# Patient Record
Sex: Female | Born: 1935 | Race: White | Hispanic: No | Marital: Single | Smoking: Former smoker
Health system: Southern US, Community
[De-identification: ages and names within clinical notes are randomized; demographics above are authoritative.]

## PROBLEM LIST (undated history)

## (undated) DIAGNOSIS — G4733 Obstructive sleep apnea (adult) (pediatric): Secondary | ICD-10-CM

## (undated) DIAGNOSIS — I340 Nonrheumatic mitral (valve) insufficiency: Secondary | ICD-10-CM

## (undated) DIAGNOSIS — J449 Chronic obstructive pulmonary disease, unspecified: Secondary | ICD-10-CM

## (undated) DIAGNOSIS — J310 Chronic rhinitis: Secondary | ICD-10-CM

## (undated) DIAGNOSIS — J948 Other specified pleural conditions: Secondary | ICD-10-CM

## (undated) DIAGNOSIS — I341 Nonrheumatic mitral (valve) prolapse: Secondary | ICD-10-CM

## (undated) DIAGNOSIS — K219 Gastro-esophageal reflux disease without esophagitis: Secondary | ICD-10-CM

## (undated) DIAGNOSIS — I428 Other cardiomyopathies: Secondary | ICD-10-CM

## (undated) DIAGNOSIS — I4891 Unspecified atrial fibrillation: Secondary | ICD-10-CM

## (undated) HISTORY — DX: Chronic rhinitis: J31.0

## (undated) HISTORY — DX: Obstructive sleep apnea (adult) (pediatric): G47.33

## (undated) HISTORY — PX: NOSE SURGERY: SHX723

## (undated) HISTORY — DX: Gastro-esophageal reflux disease without esophagitis: K21.9

## (undated) HISTORY — DX: Unspecified atrial fibrillation: I48.91

## (undated) HISTORY — DX: Other specified pleural conditions: J94.8

## (undated) HISTORY — DX: Chronic obstructive pulmonary disease, unspecified: J44.9

## (undated) HISTORY — DX: Other cardiomyopathies: I42.8

## (undated) HISTORY — DX: Nonrheumatic mitral (valve) prolapse: I34.1

## (undated) HISTORY — PX: OTHER SURGICAL HISTORY: SHX169

## (undated) HISTORY — DX: Nonrheumatic mitral (valve) insufficiency: I34.0

---

## 1998-09-08 HISTORY — PX: APPENDECTOMY: SHX54

## 1999-06-16 ENCOUNTER — Emergency Department (HOSPITAL_COMMUNITY): Admission: EM | Admit: 1999-06-16 | Discharge: 1999-06-16 | Payer: Self-pay | Admitting: Emergency Medicine

## 1999-06-26 ENCOUNTER — Encounter: Payer: Self-pay | Admitting: Internal Medicine

## 1999-06-26 ENCOUNTER — Encounter: Admission: RE | Admit: 1999-06-26 | Discharge: 1999-06-26 | Payer: Self-pay | Admitting: Internal Medicine

## 1999-08-14 ENCOUNTER — Encounter: Admission: RE | Admit: 1999-08-14 | Discharge: 1999-08-14 | Payer: Self-pay | Admitting: Otolaryngology

## 1999-08-14 ENCOUNTER — Encounter: Payer: Self-pay | Admitting: Otolaryngology

## 1999-08-19 ENCOUNTER — Inpatient Hospital Stay (HOSPITAL_COMMUNITY): Admission: EM | Admit: 1999-08-19 | Discharge: 1999-08-31 | Payer: Self-pay | Admitting: Emergency Medicine

## 1999-08-19 ENCOUNTER — Encounter: Payer: Self-pay | Admitting: Internal Medicine

## 1999-08-26 ENCOUNTER — Encounter: Payer: Self-pay | Admitting: Internal Medicine

## 1999-08-28 ENCOUNTER — Encounter: Payer: Self-pay | Admitting: Internal Medicine

## 1999-08-30 ENCOUNTER — Encounter: Payer: Self-pay | Admitting: Internal Medicine

## 1999-09-24 ENCOUNTER — Encounter: Admission: RE | Admit: 1999-09-24 | Discharge: 1999-09-24 | Payer: Self-pay | Admitting: Otolaryngology

## 1999-09-24 ENCOUNTER — Encounter: Payer: Self-pay | Admitting: Otolaryngology

## 1999-10-30 ENCOUNTER — Ambulatory Visit (HOSPITAL_COMMUNITY): Admission: RE | Admit: 1999-10-30 | Discharge: 1999-10-30 | Payer: Self-pay | Admitting: Internal Medicine

## 1999-10-30 ENCOUNTER — Encounter: Payer: Self-pay | Admitting: Internal Medicine

## 1999-11-02 ENCOUNTER — Encounter: Payer: Self-pay | Admitting: Emergency Medicine

## 1999-11-02 ENCOUNTER — Emergency Department (HOSPITAL_COMMUNITY): Admission: EM | Admit: 1999-11-02 | Discharge: 1999-11-02 | Payer: Self-pay | Admitting: Emergency Medicine

## 2000-01-10 ENCOUNTER — Encounter: Payer: Self-pay | Admitting: Gastroenterology

## 2000-01-10 ENCOUNTER — Encounter (INDEPENDENT_AMBULATORY_CARE_PROVIDER_SITE_OTHER): Payer: Self-pay | Admitting: *Deleted

## 2000-01-10 ENCOUNTER — Ambulatory Visit (HOSPITAL_COMMUNITY): Admission: RE | Admit: 2000-01-10 | Discharge: 2000-01-10 | Payer: Self-pay | Admitting: Gastroenterology

## 2000-01-31 ENCOUNTER — Encounter: Admission: RE | Admit: 2000-01-31 | Discharge: 2000-01-31 | Payer: Self-pay | Admitting: Internal Medicine

## 2000-01-31 ENCOUNTER — Encounter: Payer: Self-pay | Admitting: Internal Medicine

## 2000-03-02 ENCOUNTER — Encounter: Admission: RE | Admit: 2000-03-02 | Discharge: 2000-03-02 | Payer: Self-pay | Admitting: *Deleted

## 2000-03-02 ENCOUNTER — Encounter: Payer: Self-pay | Admitting: Allergy and Immunology

## 2000-07-06 ENCOUNTER — Encounter: Payer: Self-pay | Admitting: Otolaryngology

## 2000-07-06 ENCOUNTER — Encounter: Admission: RE | Admit: 2000-07-06 | Discharge: 2000-07-06 | Payer: Self-pay | Admitting: Otolaryngology

## 2000-07-13 ENCOUNTER — Encounter: Payer: Self-pay | Admitting: Internal Medicine

## 2000-07-13 ENCOUNTER — Ambulatory Visit (HOSPITAL_COMMUNITY): Admission: RE | Admit: 2000-07-13 | Discharge: 2000-07-13 | Payer: Self-pay | Admitting: Internal Medicine

## 2000-09-08 HISTORY — PX: LAPAROSCOPIC NISSEN FUNDOPLICATION: SHX1932

## 2000-11-02 ENCOUNTER — Encounter: Admission: RE | Admit: 2000-11-02 | Discharge: 2000-11-02 | Payer: Self-pay | Admitting: Internal Medicine

## 2000-11-02 ENCOUNTER — Encounter: Payer: Self-pay | Admitting: Internal Medicine

## 2000-12-28 ENCOUNTER — Encounter: Admission: RE | Admit: 2000-12-28 | Discharge: 2000-12-28 | Payer: Self-pay | Admitting: Otolaryngology

## 2000-12-28 ENCOUNTER — Encounter: Payer: Self-pay | Admitting: Otolaryngology

## 2000-12-29 ENCOUNTER — Ambulatory Visit (HOSPITAL_BASED_OUTPATIENT_CLINIC_OR_DEPARTMENT_OTHER): Admission: RE | Admit: 2000-12-29 | Discharge: 2000-12-30 | Payer: Self-pay | Admitting: Otolaryngology

## 2000-12-29 ENCOUNTER — Encounter (INDEPENDENT_AMBULATORY_CARE_PROVIDER_SITE_OTHER): Payer: Self-pay | Admitting: *Deleted

## 2001-02-26 ENCOUNTER — Encounter: Payer: Self-pay | Admitting: Internal Medicine

## 2001-02-26 ENCOUNTER — Encounter: Admission: RE | Admit: 2001-02-26 | Discharge: 2001-02-26 | Payer: Self-pay | Admitting: Internal Medicine

## 2001-07-05 ENCOUNTER — Ambulatory Visit (HOSPITAL_COMMUNITY): Admission: RE | Admit: 2001-07-05 | Discharge: 2001-07-05 | Payer: Self-pay | Admitting: Gastroenterology

## 2001-07-30 ENCOUNTER — Encounter: Payer: Self-pay | Admitting: General Surgery

## 2001-07-30 ENCOUNTER — Encounter: Admission: RE | Admit: 2001-07-30 | Discharge: 2001-07-30 | Payer: Self-pay | Admitting: General Surgery

## 2001-09-24 ENCOUNTER — Ambulatory Visit: Admission: RE | Admit: 2001-09-24 | Discharge: 2001-09-24 | Payer: Self-pay | Admitting: General Surgery

## 2001-09-24 ENCOUNTER — Encounter: Payer: Self-pay | Admitting: General Surgery

## 2001-10-19 ENCOUNTER — Ambulatory Visit (HOSPITAL_COMMUNITY): Admission: RE | Admit: 2001-10-19 | Discharge: 2001-10-19 | Payer: Self-pay | Admitting: General Surgery

## 2001-10-19 ENCOUNTER — Encounter: Payer: Self-pay | Admitting: General Surgery

## 2001-11-22 ENCOUNTER — Encounter: Payer: Self-pay | Admitting: Otolaryngology

## 2001-11-22 ENCOUNTER — Ambulatory Visit (HOSPITAL_COMMUNITY): Admission: RE | Admit: 2001-11-22 | Discharge: 2001-11-22 | Payer: Self-pay | Admitting: Otolaryngology

## 2001-11-27 ENCOUNTER — Inpatient Hospital Stay (HOSPITAL_COMMUNITY): Admission: EM | Admit: 2001-11-27 | Discharge: 2001-12-03 | Payer: Self-pay | Admitting: *Deleted

## 2001-11-28 ENCOUNTER — Encounter: Payer: Self-pay | Admitting: *Deleted

## 2002-01-12 ENCOUNTER — Encounter: Payer: Self-pay | Admitting: Otolaryngology

## 2002-01-12 ENCOUNTER — Ambulatory Visit (HOSPITAL_COMMUNITY): Admission: RE | Admit: 2002-01-12 | Discharge: 2002-01-12 | Payer: Self-pay | Admitting: Otolaryngology

## 2002-01-17 ENCOUNTER — Encounter: Payer: Self-pay | Admitting: General Surgery

## 2002-01-24 ENCOUNTER — Inpatient Hospital Stay (HOSPITAL_COMMUNITY): Admission: RE | Admit: 2002-01-24 | Discharge: 2002-01-28 | Payer: Self-pay | Admitting: General Surgery

## 2002-01-24 ENCOUNTER — Encounter: Payer: Self-pay | Admitting: General Surgery

## 2002-01-26 ENCOUNTER — Encounter: Payer: Self-pay | Admitting: General Surgery

## 2002-03-01 ENCOUNTER — Ambulatory Visit (HOSPITAL_COMMUNITY): Admission: RE | Admit: 2002-03-01 | Discharge: 2002-03-01 | Payer: Self-pay | Admitting: General Surgery

## 2002-03-01 ENCOUNTER — Encounter: Payer: Self-pay | Admitting: General Surgery

## 2002-05-19 ENCOUNTER — Inpatient Hospital Stay (HOSPITAL_COMMUNITY): Admission: AD | Admit: 2002-05-19 | Discharge: 2002-06-04 | Payer: Self-pay | Admitting: Internal Medicine

## 2002-05-19 ENCOUNTER — Encounter (INDEPENDENT_AMBULATORY_CARE_PROVIDER_SITE_OTHER): Payer: Self-pay | Admitting: *Deleted

## 2002-05-20 ENCOUNTER — Encounter: Payer: Self-pay | Admitting: Internal Medicine

## 2002-05-21 ENCOUNTER — Encounter: Payer: Self-pay | Admitting: Internal Medicine

## 2002-05-23 ENCOUNTER — Encounter: Payer: Self-pay | Admitting: Internal Medicine

## 2002-05-26 ENCOUNTER — Encounter: Payer: Self-pay | Admitting: Internal Medicine

## 2002-06-03 ENCOUNTER — Encounter: Payer: Self-pay | Admitting: Internal Medicine

## 2002-06-05 ENCOUNTER — Inpatient Hospital Stay (HOSPITAL_COMMUNITY): Admission: EM | Admit: 2002-06-05 | Discharge: 2002-06-09 | Payer: Self-pay | Admitting: *Deleted

## 2002-06-05 ENCOUNTER — Encounter: Payer: Self-pay | Admitting: Emergency Medicine

## 2002-09-12 ENCOUNTER — Ambulatory Visit (HOSPITAL_COMMUNITY): Admission: RE | Admit: 2002-09-12 | Discharge: 2002-09-12 | Payer: Self-pay | Admitting: Otolaryngology

## 2002-09-12 ENCOUNTER — Encounter: Payer: Self-pay | Admitting: Otolaryngology

## 2002-12-21 ENCOUNTER — Ambulatory Visit (HOSPITAL_COMMUNITY): Admission: RE | Admit: 2002-12-21 | Discharge: 2002-12-21 | Payer: Self-pay | Admitting: Otolaryngology

## 2002-12-21 ENCOUNTER — Encounter: Payer: Self-pay | Admitting: Internal Medicine

## 2002-12-21 ENCOUNTER — Encounter: Payer: Self-pay | Admitting: Otolaryngology

## 2002-12-21 ENCOUNTER — Ambulatory Visit (HOSPITAL_COMMUNITY): Admission: RE | Admit: 2002-12-21 | Discharge: 2002-12-21 | Payer: Self-pay | Admitting: Internal Medicine

## 2003-11-21 ENCOUNTER — Ambulatory Visit (HOSPITAL_COMMUNITY): Admission: RE | Admit: 2003-11-21 | Discharge: 2003-11-21 | Payer: Self-pay | Admitting: Otolaryngology

## 2003-12-25 ENCOUNTER — Ambulatory Visit (HOSPITAL_COMMUNITY): Admission: RE | Admit: 2003-12-25 | Discharge: 2003-12-25 | Payer: Self-pay | Admitting: Internal Medicine

## 2004-01-03 ENCOUNTER — Ambulatory Visit (HOSPITAL_COMMUNITY): Admission: RE | Admit: 2004-01-03 | Discharge: 2004-01-03 | Payer: Self-pay | Admitting: Otolaryngology

## 2004-09-12 ENCOUNTER — Ambulatory Visit: Payer: Self-pay | Admitting: Internal Medicine

## 2004-09-18 ENCOUNTER — Ambulatory Visit: Payer: Self-pay | Admitting: Internal Medicine

## 2004-09-25 ENCOUNTER — Ambulatory Visit: Payer: Self-pay | Admitting: Internal Medicine

## 2004-10-11 ENCOUNTER — Ambulatory Visit: Payer: Self-pay | Admitting: Internal Medicine

## 2004-10-15 ENCOUNTER — Ambulatory Visit: Payer: Self-pay | Admitting: Internal Medicine

## 2004-10-23 ENCOUNTER — Ambulatory Visit: Payer: Self-pay | Admitting: Internal Medicine

## 2004-10-30 ENCOUNTER — Ambulatory Visit: Payer: Self-pay | Admitting: Internal Medicine

## 2004-11-07 ENCOUNTER — Ambulatory Visit: Payer: Self-pay | Admitting: Internal Medicine

## 2004-11-13 ENCOUNTER — Ambulatory Visit: Payer: Self-pay | Admitting: Internal Medicine

## 2004-11-18 ENCOUNTER — Ambulatory Visit: Payer: Self-pay | Admitting: Internal Medicine

## 2004-11-21 ENCOUNTER — Ambulatory Visit: Payer: Self-pay | Admitting: Orthopedic Surgery

## 2004-11-26 ENCOUNTER — Encounter (HOSPITAL_COMMUNITY): Admission: RE | Admit: 2004-11-26 | Discharge: 2004-12-26 | Payer: Self-pay | Admitting: Orthopedic Surgery

## 2004-11-28 ENCOUNTER — Ambulatory Visit: Payer: Self-pay | Admitting: Internal Medicine

## 2004-12-04 ENCOUNTER — Ambulatory Visit: Payer: Self-pay | Admitting: Internal Medicine

## 2004-12-13 ENCOUNTER — Ambulatory Visit: Payer: Self-pay | Admitting: Internal Medicine

## 2004-12-16 ENCOUNTER — Ambulatory Visit: Payer: Self-pay | Admitting: Internal Medicine

## 2004-12-23 ENCOUNTER — Ambulatory Visit: Payer: Self-pay | Admitting: Internal Medicine

## 2004-12-30 ENCOUNTER — Ambulatory Visit: Payer: Self-pay | Admitting: Internal Medicine

## 2005-01-01 ENCOUNTER — Ambulatory Visit: Payer: Self-pay | Admitting: Orthopedic Surgery

## 2005-01-08 ENCOUNTER — Ambulatory Visit: Payer: Self-pay | Admitting: Orthopedic Surgery

## 2005-01-08 ENCOUNTER — Ambulatory Visit: Payer: Self-pay | Admitting: Internal Medicine

## 2005-01-21 ENCOUNTER — Ambulatory Visit: Payer: Self-pay | Admitting: Internal Medicine

## 2005-01-30 ENCOUNTER — Ambulatory Visit: Payer: Self-pay | Admitting: Internal Medicine

## 2005-02-05 ENCOUNTER — Ambulatory Visit: Payer: Self-pay | Admitting: Internal Medicine

## 2005-02-10 ENCOUNTER — Ambulatory Visit: Payer: Self-pay | Admitting: Internal Medicine

## 2005-02-11 ENCOUNTER — Ambulatory Visit: Payer: Self-pay | Admitting: Internal Medicine

## 2005-02-26 ENCOUNTER — Ambulatory Visit: Payer: Self-pay | Admitting: Internal Medicine

## 2005-03-05 ENCOUNTER — Ambulatory Visit: Payer: Self-pay | Admitting: Internal Medicine

## 2005-03-12 ENCOUNTER — Ambulatory Visit: Payer: Self-pay | Admitting: Internal Medicine

## 2005-03-13 ENCOUNTER — Ambulatory Visit (HOSPITAL_COMMUNITY): Admission: RE | Admit: 2005-03-13 | Discharge: 2005-03-13 | Payer: Self-pay | Admitting: Internal Medicine

## 2005-03-24 ENCOUNTER — Ambulatory Visit: Payer: Self-pay | Admitting: Internal Medicine

## 2005-04-02 ENCOUNTER — Ambulatory Visit: Payer: Self-pay | Admitting: Internal Medicine

## 2005-04-09 ENCOUNTER — Ambulatory Visit: Payer: Self-pay | Admitting: Internal Medicine

## 2005-04-16 ENCOUNTER — Ambulatory Visit: Payer: Self-pay | Admitting: Internal Medicine

## 2005-04-21 ENCOUNTER — Ambulatory Visit: Payer: Self-pay | Admitting: Internal Medicine

## 2005-04-23 ENCOUNTER — Ambulatory Visit: Payer: Self-pay | Admitting: Internal Medicine

## 2005-04-28 ENCOUNTER — Ambulatory Visit: Payer: Self-pay | Admitting: Internal Medicine

## 2005-05-05 ENCOUNTER — Ambulatory Visit: Payer: Self-pay | Admitting: Internal Medicine

## 2005-05-09 ENCOUNTER — Ambulatory Visit: Payer: Self-pay | Admitting: Internal Medicine

## 2005-05-13 ENCOUNTER — Ambulatory Visit: Payer: Self-pay | Admitting: Internal Medicine

## 2005-05-21 ENCOUNTER — Ambulatory Visit: Payer: Self-pay | Admitting: Internal Medicine

## 2005-06-02 ENCOUNTER — Ambulatory Visit: Payer: Self-pay | Admitting: Internal Medicine

## 2005-06-10 ENCOUNTER — Ambulatory Visit: Payer: Self-pay | Admitting: Pulmonary Disease

## 2005-06-18 ENCOUNTER — Ambulatory Visit: Payer: Self-pay | Admitting: Internal Medicine

## 2005-06-30 ENCOUNTER — Ambulatory Visit: Payer: Self-pay | Admitting: Internal Medicine

## 2005-07-11 ENCOUNTER — Ambulatory Visit: Payer: Self-pay | Admitting: Pulmonary Disease

## 2005-07-16 ENCOUNTER — Ambulatory Visit: Payer: Self-pay | Admitting: Internal Medicine

## 2005-07-25 ENCOUNTER — Ambulatory Visit: Payer: Self-pay | Admitting: Internal Medicine

## 2005-08-06 ENCOUNTER — Ambulatory Visit: Payer: Self-pay | Admitting: Internal Medicine

## 2005-08-11 ENCOUNTER — Ambulatory Visit: Payer: Self-pay | Admitting: Pulmonary Disease

## 2005-08-20 ENCOUNTER — Ambulatory Visit: Payer: Self-pay | Admitting: Internal Medicine

## 2005-09-03 ENCOUNTER — Ambulatory Visit: Payer: Self-pay | Admitting: Internal Medicine

## 2005-09-11 ENCOUNTER — Ambulatory Visit: Payer: Self-pay | Admitting: Internal Medicine

## 2005-09-18 ENCOUNTER — Ambulatory Visit: Payer: Self-pay | Admitting: Internal Medicine

## 2005-09-24 ENCOUNTER — Ambulatory Visit (HOSPITAL_COMMUNITY): Admission: RE | Admit: 2005-09-24 | Discharge: 2005-09-24 | Payer: Self-pay | Admitting: Internal Medicine

## 2005-09-30 ENCOUNTER — Ambulatory Visit: Payer: Self-pay | Admitting: Internal Medicine

## 2005-10-06 ENCOUNTER — Ambulatory Visit (HOSPITAL_COMMUNITY): Admission: RE | Admit: 2005-10-06 | Discharge: 2005-10-06 | Payer: Self-pay | Admitting: Internal Medicine

## 2005-10-15 ENCOUNTER — Ambulatory Visit: Payer: Self-pay | Admitting: Internal Medicine

## 2005-10-20 ENCOUNTER — Ambulatory Visit (HOSPITAL_COMMUNITY): Admission: RE | Admit: 2005-10-20 | Discharge: 2005-10-20 | Payer: Self-pay | Admitting: Internal Medicine

## 2005-10-21 ENCOUNTER — Ambulatory Visit: Payer: Self-pay | Admitting: Internal Medicine

## 2005-10-30 ENCOUNTER — Ambulatory Visit: Payer: Self-pay | Admitting: Internal Medicine

## 2005-11-10 ENCOUNTER — Ambulatory Visit: Payer: Self-pay | Admitting: Internal Medicine

## 2005-11-18 ENCOUNTER — Ambulatory Visit: Payer: Self-pay | Admitting: Internal Medicine

## 2005-11-28 ENCOUNTER — Ambulatory Visit: Payer: Self-pay | Admitting: Internal Medicine

## 2005-12-04 ENCOUNTER — Ambulatory Visit (HOSPITAL_COMMUNITY): Admission: RE | Admit: 2005-12-04 | Discharge: 2005-12-04 | Payer: Self-pay | Admitting: *Deleted

## 2005-12-09 ENCOUNTER — Ambulatory Visit: Payer: Self-pay | Admitting: Internal Medicine

## 2005-12-16 ENCOUNTER — Ambulatory Visit: Payer: Self-pay | Admitting: Internal Medicine

## 2006-01-02 ENCOUNTER — Ambulatory Visit: Payer: Self-pay | Admitting: Internal Medicine

## 2006-01-15 ENCOUNTER — Ambulatory Visit: Payer: Self-pay | Admitting: Internal Medicine

## 2006-01-19 ENCOUNTER — Ambulatory Visit: Payer: Self-pay | Admitting: Internal Medicine

## 2006-02-06 ENCOUNTER — Ambulatory Visit: Payer: Self-pay | Admitting: Internal Medicine

## 2006-02-10 ENCOUNTER — Ambulatory Visit: Payer: Self-pay | Admitting: Internal Medicine

## 2006-02-11 ENCOUNTER — Ambulatory Visit: Payer: Self-pay | Admitting: Internal Medicine

## 2006-02-16 ENCOUNTER — Ambulatory Visit: Payer: Self-pay | Admitting: Internal Medicine

## 2006-02-25 ENCOUNTER — Ambulatory Visit: Payer: Self-pay | Admitting: Internal Medicine

## 2006-03-09 ENCOUNTER — Ambulatory Visit: Payer: Self-pay | Admitting: Internal Medicine

## 2006-03-16 ENCOUNTER — Ambulatory Visit: Payer: Self-pay | Admitting: Internal Medicine

## 2006-03-23 ENCOUNTER — Ambulatory Visit: Payer: Self-pay | Admitting: Internal Medicine

## 2006-04-07 ENCOUNTER — Ambulatory Visit: Payer: Self-pay | Admitting: Internal Medicine

## 2006-04-15 ENCOUNTER — Ambulatory Visit: Payer: Self-pay | Admitting: Internal Medicine

## 2006-04-15 ENCOUNTER — Ambulatory Visit (HOSPITAL_COMMUNITY): Admission: RE | Admit: 2006-04-15 | Discharge: 2006-04-15 | Payer: Self-pay | Admitting: Internal Medicine

## 2006-04-21 ENCOUNTER — Ambulatory Visit: Payer: Self-pay | Admitting: Internal Medicine

## 2006-05-06 ENCOUNTER — Ambulatory Visit: Payer: Self-pay | Admitting: Internal Medicine

## 2006-05-13 ENCOUNTER — Ambulatory Visit: Payer: Self-pay | Admitting: Internal Medicine

## 2006-05-15 ENCOUNTER — Ambulatory Visit: Payer: Self-pay | Admitting: Internal Medicine

## 2006-05-28 ENCOUNTER — Ambulatory Visit: Payer: Self-pay | Admitting: Internal Medicine

## 2006-06-09 ENCOUNTER — Ambulatory Visit: Payer: Self-pay | Admitting: Internal Medicine

## 2006-06-15 ENCOUNTER — Ambulatory Visit (HOSPITAL_COMMUNITY): Admission: RE | Admit: 2006-06-15 | Discharge: 2006-06-15 | Payer: Self-pay | Admitting: Internal Medicine

## 2006-06-19 ENCOUNTER — Ambulatory Visit: Payer: Self-pay | Admitting: Internal Medicine

## 2006-06-25 ENCOUNTER — Ambulatory Visit: Payer: Self-pay | Admitting: Internal Medicine

## 2006-07-07 ENCOUNTER — Ambulatory Visit: Payer: Self-pay | Admitting: Internal Medicine

## 2006-07-23 ENCOUNTER — Ambulatory Visit: Payer: Self-pay | Admitting: Internal Medicine

## 2006-08-14 ENCOUNTER — Encounter: Admission: RE | Admit: 2006-08-14 | Discharge: 2006-08-14 | Payer: Self-pay | Admitting: *Deleted

## 2006-08-24 ENCOUNTER — Ambulatory Visit: Payer: Self-pay | Admitting: Internal Medicine

## 2006-09-28 ENCOUNTER — Ambulatory Visit: Payer: Self-pay | Admitting: Internal Medicine

## 2006-10-06 ENCOUNTER — Ambulatory Visit: Payer: Self-pay | Admitting: Internal Medicine

## 2006-10-26 ENCOUNTER — Ambulatory Visit: Payer: Self-pay | Admitting: Internal Medicine

## 2006-11-03 ENCOUNTER — Ambulatory Visit: Payer: Self-pay | Admitting: Internal Medicine

## 2006-11-24 ENCOUNTER — Ambulatory Visit: Payer: Self-pay | Admitting: Internal Medicine

## 2006-12-02 ENCOUNTER — Ambulatory Visit: Payer: Self-pay | Admitting: Internal Medicine

## 2006-12-03 ENCOUNTER — Ambulatory Visit (HOSPITAL_COMMUNITY): Admission: RE | Admit: 2006-12-03 | Discharge: 2006-12-03 | Payer: Self-pay | Admitting: Internal Medicine

## 2006-12-28 ENCOUNTER — Ambulatory Visit: Payer: Self-pay | Admitting: Internal Medicine

## 2007-01-25 ENCOUNTER — Ambulatory Visit: Payer: Self-pay | Admitting: Internal Medicine

## 2007-02-16 ENCOUNTER — Emergency Department (HOSPITAL_COMMUNITY): Admission: EM | Admit: 2007-02-16 | Discharge: 2007-02-16 | Payer: Self-pay | Admitting: Emergency Medicine

## 2007-02-24 ENCOUNTER — Ambulatory Visit: Payer: Self-pay | Admitting: Internal Medicine

## 2007-03-08 ENCOUNTER — Ambulatory Visit: Payer: Self-pay | Admitting: Internal Medicine

## 2007-03-24 ENCOUNTER — Ambulatory Visit: Payer: Self-pay | Admitting: Internal Medicine

## 2007-04-15 ENCOUNTER — Ambulatory Visit: Payer: Self-pay | Admitting: Internal Medicine

## 2007-04-19 ENCOUNTER — Ambulatory Visit (HOSPITAL_COMMUNITY): Admission: RE | Admit: 2007-04-19 | Discharge: 2007-04-19 | Payer: Self-pay | Admitting: *Deleted

## 2007-04-28 ENCOUNTER — Ambulatory Visit: Payer: Self-pay | Admitting: Internal Medicine

## 2007-05-17 ENCOUNTER — Ambulatory Visit: Payer: Self-pay | Admitting: Internal Medicine

## 2007-05-28 ENCOUNTER — Ambulatory Visit: Payer: Self-pay | Admitting: Internal Medicine

## 2007-06-28 ENCOUNTER — Ambulatory Visit: Payer: Self-pay | Admitting: Internal Medicine

## 2007-06-30 ENCOUNTER — Ambulatory Visit: Payer: Self-pay | Admitting: Gastroenterology

## 2007-07-05 ENCOUNTER — Ambulatory Visit (HOSPITAL_COMMUNITY): Admission: RE | Admit: 2007-07-05 | Discharge: 2007-07-05 | Payer: Self-pay | Admitting: Gastroenterology

## 2007-07-16 ENCOUNTER — Ambulatory Visit: Payer: Self-pay | Admitting: Internal Medicine

## 2007-07-30 ENCOUNTER — Ambulatory Visit: Payer: Self-pay | Admitting: Pulmonary Disease

## 2007-08-25 ENCOUNTER — Ambulatory Visit: Payer: Self-pay | Admitting: Gastroenterology

## 2007-08-30 ENCOUNTER — Ambulatory Visit: Payer: Self-pay | Admitting: Pulmonary Disease

## 2007-09-03 ENCOUNTER — Ambulatory Visit: Payer: Self-pay | Admitting: Internal Medicine

## 2007-09-03 DIAGNOSIS — I059 Rheumatic mitral valve disease, unspecified: Secondary | ICD-10-CM | POA: Insufficient documentation

## 2007-09-15 ENCOUNTER — Ambulatory Visit: Payer: Self-pay | Admitting: Internal Medicine

## 2007-09-20 ENCOUNTER — Telehealth: Payer: Self-pay | Admitting: Internal Medicine

## 2007-09-30 ENCOUNTER — Ambulatory Visit: Payer: Self-pay | Admitting: Internal Medicine

## 2007-10-28 ENCOUNTER — Encounter: Payer: Self-pay | Admitting: Internal Medicine

## 2007-10-29 ENCOUNTER — Telehealth (INDEPENDENT_AMBULATORY_CARE_PROVIDER_SITE_OTHER): Payer: Self-pay | Admitting: *Deleted

## 2007-11-01 ENCOUNTER — Ambulatory Visit: Payer: Self-pay | Admitting: Internal Medicine

## 2007-11-04 ENCOUNTER — Telehealth (INDEPENDENT_AMBULATORY_CARE_PROVIDER_SITE_OTHER): Payer: Self-pay | Admitting: *Deleted

## 2007-11-10 ENCOUNTER — Encounter: Payer: Self-pay | Admitting: Internal Medicine

## 2007-11-12 ENCOUNTER — Ambulatory Visit: Payer: Self-pay | Admitting: Internal Medicine

## 2007-11-12 DIAGNOSIS — J4489 Other specified chronic obstructive pulmonary disease: Secondary | ICD-10-CM | POA: Insufficient documentation

## 2007-11-12 DIAGNOSIS — G4733 Obstructive sleep apnea (adult) (pediatric): Secondary | ICD-10-CM

## 2007-11-12 DIAGNOSIS — J449 Chronic obstructive pulmonary disease, unspecified: Secondary | ICD-10-CM

## 2007-11-21 ENCOUNTER — Encounter: Payer: Self-pay | Admitting: Internal Medicine

## 2007-11-30 ENCOUNTER — Telehealth: Payer: Self-pay | Admitting: Internal Medicine

## 2007-12-01 ENCOUNTER — Telehealth: Payer: Self-pay | Admitting: Internal Medicine

## 2007-12-02 ENCOUNTER — Telehealth: Payer: Self-pay | Admitting: Internal Medicine

## 2007-12-10 ENCOUNTER — Ambulatory Visit: Payer: Self-pay | Admitting: Internal Medicine

## 2007-12-13 ENCOUNTER — Telehealth (INDEPENDENT_AMBULATORY_CARE_PROVIDER_SITE_OTHER): Payer: Self-pay | Admitting: *Deleted

## 2007-12-13 ENCOUNTER — Ambulatory Visit: Payer: Self-pay | Admitting: Internal Medicine

## 2007-12-15 ENCOUNTER — Telehealth: Payer: Self-pay | Admitting: Internal Medicine

## 2008-01-07 ENCOUNTER — Ambulatory Visit: Payer: Self-pay | Admitting: Internal Medicine

## 2008-01-12 ENCOUNTER — Ambulatory Visit: Payer: Self-pay | Admitting: Internal Medicine

## 2008-01-19 ENCOUNTER — Encounter: Payer: Self-pay | Admitting: Internal Medicine

## 2008-01-19 ENCOUNTER — Ambulatory Visit: Payer: Self-pay | Admitting: Gastroenterology

## 2008-01-25 ENCOUNTER — Telehealth (INDEPENDENT_AMBULATORY_CARE_PROVIDER_SITE_OTHER): Payer: Self-pay | Admitting: *Deleted

## 2008-02-07 ENCOUNTER — Ambulatory Visit: Payer: Self-pay | Admitting: Internal Medicine

## 2008-03-13 ENCOUNTER — Ambulatory Visit: Payer: Self-pay | Admitting: Internal Medicine

## 2008-04-03 ENCOUNTER — Ambulatory Visit (HOSPITAL_COMMUNITY): Admission: RE | Admit: 2008-04-03 | Discharge: 2008-04-03 | Payer: Self-pay | Admitting: Family Medicine

## 2008-04-13 ENCOUNTER — Ambulatory Visit: Payer: Self-pay | Admitting: Internal Medicine

## 2008-05-03 ENCOUNTER — Ambulatory Visit (HOSPITAL_COMMUNITY): Admission: RE | Admit: 2008-05-03 | Discharge: 2008-05-03 | Payer: Self-pay | Admitting: Family Medicine

## 2008-05-12 ENCOUNTER — Ambulatory Visit: Payer: Self-pay | Admitting: Internal Medicine

## 2008-05-25 DIAGNOSIS — Z8719 Personal history of other diseases of the digestive system: Secondary | ICD-10-CM

## 2008-05-25 DIAGNOSIS — I080 Rheumatic disorders of both mitral and aortic valves: Secondary | ICD-10-CM

## 2008-05-26 ENCOUNTER — Telehealth: Payer: Self-pay | Admitting: Internal Medicine

## 2008-06-01 ENCOUNTER — Ambulatory Visit: Payer: Self-pay | Admitting: Gastroenterology

## 2008-06-13 ENCOUNTER — Ambulatory Visit: Payer: Self-pay | Admitting: Internal Medicine

## 2008-07-14 ENCOUNTER — Ambulatory Visit: Payer: Self-pay | Admitting: Internal Medicine

## 2008-07-27 ENCOUNTER — Telehealth: Payer: Self-pay | Admitting: Internal Medicine

## 2008-08-14 ENCOUNTER — Ambulatory Visit: Payer: Self-pay | Admitting: Internal Medicine

## 2008-08-23 ENCOUNTER — Telehealth (INDEPENDENT_AMBULATORY_CARE_PROVIDER_SITE_OTHER): Payer: Self-pay | Admitting: *Deleted

## 2008-08-29 ENCOUNTER — Encounter: Payer: Self-pay | Admitting: Internal Medicine

## 2008-09-14 ENCOUNTER — Ambulatory Visit: Payer: Self-pay | Admitting: Internal Medicine

## 2008-09-15 ENCOUNTER — Telehealth (INDEPENDENT_AMBULATORY_CARE_PROVIDER_SITE_OTHER): Payer: Self-pay | Admitting: *Deleted

## 2008-10-16 ENCOUNTER — Ambulatory Visit: Payer: Self-pay | Admitting: Internal Medicine

## 2008-11-13 ENCOUNTER — Ambulatory Visit: Payer: Self-pay | Admitting: Internal Medicine

## 2008-11-29 ENCOUNTER — Telehealth: Payer: Self-pay | Admitting: Internal Medicine

## 2008-12-14 ENCOUNTER — Ambulatory Visit: Payer: Self-pay | Admitting: Internal Medicine

## 2009-01-15 ENCOUNTER — Ambulatory Visit: Payer: Self-pay | Admitting: Internal Medicine

## 2009-02-12 ENCOUNTER — Ambulatory Visit: Payer: Self-pay | Admitting: Internal Medicine

## 2009-02-20 ENCOUNTER — Telehealth (INDEPENDENT_AMBULATORY_CARE_PROVIDER_SITE_OTHER): Payer: Self-pay | Admitting: *Deleted

## 2009-02-27 ENCOUNTER — Encounter: Payer: Self-pay | Admitting: Internal Medicine

## 2009-03-14 ENCOUNTER — Encounter: Payer: Self-pay | Admitting: Internal Medicine

## 2009-03-14 ENCOUNTER — Ambulatory Visit: Payer: Self-pay | Admitting: Pulmonary Disease

## 2009-04-03 ENCOUNTER — Ambulatory Visit: Payer: Self-pay | Admitting: Internal Medicine

## 2009-04-13 ENCOUNTER — Ambulatory Visit: Payer: Self-pay | Admitting: Internal Medicine

## 2009-05-08 ENCOUNTER — Ambulatory Visit (HOSPITAL_COMMUNITY): Admission: RE | Admit: 2009-05-08 | Discharge: 2009-05-08 | Payer: Self-pay | Admitting: Family Medicine

## 2009-05-15 ENCOUNTER — Ambulatory Visit: Payer: Self-pay | Admitting: Internal Medicine

## 2009-06-13 ENCOUNTER — Ambulatory Visit: Payer: Self-pay | Admitting: Internal Medicine

## 2009-07-02 ENCOUNTER — Telehealth: Payer: Self-pay | Admitting: Internal Medicine

## 2009-07-13 ENCOUNTER — Ambulatory Visit: Payer: Self-pay | Admitting: Internal Medicine

## 2009-07-31 ENCOUNTER — Ambulatory Visit: Payer: Self-pay | Admitting: Internal Medicine

## 2009-08-13 ENCOUNTER — Ambulatory Visit: Payer: Self-pay | Admitting: Internal Medicine

## 2009-08-22 ENCOUNTER — Telehealth (INDEPENDENT_AMBULATORY_CARE_PROVIDER_SITE_OTHER): Payer: Self-pay | Admitting: *Deleted

## 2009-08-24 ENCOUNTER — Encounter: Payer: Self-pay | Admitting: Internal Medicine

## 2009-09-06 ENCOUNTER — Encounter: Payer: Self-pay | Admitting: Internal Medicine

## 2009-09-10 ENCOUNTER — Telehealth: Payer: Self-pay | Admitting: Internal Medicine

## 2009-09-14 ENCOUNTER — Ambulatory Visit: Payer: Self-pay | Admitting: Internal Medicine

## 2009-10-17 ENCOUNTER — Ambulatory Visit: Payer: Self-pay | Admitting: Internal Medicine

## 2009-10-31 ENCOUNTER — Telehealth (INDEPENDENT_AMBULATORY_CARE_PROVIDER_SITE_OTHER): Payer: Self-pay | Admitting: *Deleted

## 2009-11-14 ENCOUNTER — Encounter: Payer: Self-pay | Admitting: Internal Medicine

## 2009-11-16 ENCOUNTER — Ambulatory Visit: Payer: Self-pay | Admitting: Internal Medicine

## 2009-11-29 ENCOUNTER — Ambulatory Visit: Payer: Self-pay | Admitting: Internal Medicine

## 2009-12-03 ENCOUNTER — Encounter: Payer: Self-pay | Admitting: Internal Medicine

## 2009-12-06 ENCOUNTER — Telehealth: Payer: Self-pay | Admitting: Internal Medicine

## 2009-12-17 ENCOUNTER — Ambulatory Visit: Payer: Self-pay | Admitting: Internal Medicine

## 2010-01-14 ENCOUNTER — Ambulatory Visit: Payer: Self-pay | Admitting: Internal Medicine

## 2010-01-28 ENCOUNTER — Telehealth: Payer: Self-pay | Admitting: Internal Medicine

## 2010-02-14 ENCOUNTER — Ambulatory Visit: Payer: Self-pay | Admitting: Internal Medicine

## 2010-03-19 ENCOUNTER — Ambulatory Visit: Payer: Self-pay | Admitting: Internal Medicine

## 2010-04-16 ENCOUNTER — Ambulatory Visit: Payer: Self-pay | Admitting: Internal Medicine

## 2010-05-20 ENCOUNTER — Ambulatory Visit: Payer: Self-pay | Admitting: Internal Medicine

## 2010-05-28 ENCOUNTER — Encounter: Payer: Self-pay | Admitting: Internal Medicine

## 2010-05-28 ENCOUNTER — Ambulatory Visit: Payer: Self-pay | Admitting: Internal Medicine

## 2010-05-28 DIAGNOSIS — I4891 Unspecified atrial fibrillation: Secondary | ICD-10-CM | POA: Insufficient documentation

## 2010-05-31 ENCOUNTER — Ambulatory Visit: Payer: Self-pay | Admitting: Cardiology

## 2010-06-03 ENCOUNTER — Ambulatory Visit (HOSPITAL_COMMUNITY): Admission: RE | Admit: 2010-06-03 | Discharge: 2010-06-03 | Payer: Self-pay | Admitting: Cardiology

## 2010-06-03 ENCOUNTER — Ambulatory Visit: Payer: Self-pay | Admitting: Cardiology

## 2010-06-03 ENCOUNTER — Encounter: Payer: Self-pay | Admitting: Cardiology

## 2010-06-04 ENCOUNTER — Encounter: Payer: Self-pay | Admitting: Cardiology

## 2010-06-07 LAB — CONVERTED CEMR LAB
Basophils Relative: 0.5 % (ref 0.0–3.0)
Chloride: 105 meq/L (ref 96–112)
Eosinophils Relative: 1.5 % (ref 0.0–5.0)
GFR calc non Af Amer: 58.96 mL/min (ref >60)
HCT: 35 % — ABNORMAL LOW (ref 36.0–46.0)
Hemoglobin: 11.7 g/dL — ABNORMAL LOW (ref 12.0–15.0)
Lymphs Abs: 0.7 10*3/uL (ref 0.7–4.0)
Monocytes Relative: 5.6 % (ref 3.0–12.0)
Neutro Abs: 4.4 10*3/uL (ref 1.4–7.7)
Platelets: 160 10*3/uL (ref 150.0–400.0)
Potassium: 3.8 meq/L (ref 3.5–5.1)
RBC: 3.96 M/uL (ref 3.87–5.11)
Sodium: 139 meq/L (ref 135–145)
Total CK: 50 units/L (ref 7–177)
WBC: 5.5 10*3/uL (ref 4.5–10.5)

## 2010-06-20 ENCOUNTER — Ambulatory Visit: Payer: Self-pay | Admitting: Internal Medicine

## 2010-06-21 ENCOUNTER — Ambulatory Visit: Payer: Self-pay | Admitting: Cardiology

## 2010-06-21 ENCOUNTER — Telehealth: Payer: Self-pay | Admitting: Internal Medicine

## 2010-06-21 ENCOUNTER — Telehealth (INDEPENDENT_AMBULATORY_CARE_PROVIDER_SITE_OTHER): Payer: Self-pay | Admitting: *Deleted

## 2010-06-21 DIAGNOSIS — I429 Cardiomyopathy, unspecified: Secondary | ICD-10-CM

## 2010-07-10 ENCOUNTER — Telehealth: Payer: Self-pay | Admitting: Internal Medicine

## 2010-07-10 ENCOUNTER — Ambulatory Visit: Payer: Self-pay | Admitting: Internal Medicine

## 2010-07-12 ENCOUNTER — Ambulatory Visit (HOSPITAL_COMMUNITY): Admission: RE | Admit: 2010-07-12 | Discharge: 2010-07-12 | Payer: Self-pay | Admitting: Internal Medicine

## 2010-07-12 ENCOUNTER — Encounter: Payer: Self-pay | Admitting: Internal Medicine

## 2010-07-18 ENCOUNTER — Ambulatory Visit (HOSPITAL_COMMUNITY): Admission: RE | Admit: 2010-07-18 | Payer: Self-pay | Admitting: *Deleted

## 2010-07-22 ENCOUNTER — Telehealth (INDEPENDENT_AMBULATORY_CARE_PROVIDER_SITE_OTHER): Payer: Self-pay

## 2010-07-22 ENCOUNTER — Ambulatory Visit: Payer: Self-pay | Admitting: Internal Medicine

## 2010-08-02 ENCOUNTER — Telehealth: Payer: Self-pay | Admitting: Internal Medicine

## 2010-08-21 ENCOUNTER — Ambulatory Visit: Payer: Self-pay | Admitting: Internal Medicine

## 2010-08-21 LAB — CONVERTED CEMR LAB
Basophils Absolute: 0 10*3/uL (ref 0.0–0.1)
Chloride: 106 meq/L (ref 96–112)
Eosinophils Relative: 0.5 % (ref 0.0–5.0)
GFR calc non Af Amer: 42.15 mL/min — ABNORMAL LOW (ref >60.00)
Glucose, Bld: 91 mg/dL (ref 70–99)
HCT: 36.4 % (ref 36.0–46.0)
Hemoglobin: 11.9 g/dL — ABNORMAL LOW (ref 12.0–15.0)
Lymphs Abs: 0.9 10*3/uL (ref 0.7–4.0)
Monocytes Relative: 7.4 % (ref 3.0–12.0)
Neutro Abs: 3.7 10*3/uL (ref 1.4–7.7)
Potassium: 4.3 meq/L (ref 3.5–5.1)
RDW: 16.8 % — ABNORMAL HIGH (ref 11.5–14.6)
Sodium: 140 meq/L (ref 135–145)

## 2010-08-23 ENCOUNTER — Encounter (INDEPENDENT_AMBULATORY_CARE_PROVIDER_SITE_OTHER): Payer: Self-pay | Admitting: *Deleted

## 2010-08-23 ENCOUNTER — Ambulatory Visit: Payer: Self-pay | Admitting: Cardiology

## 2010-09-04 ENCOUNTER — Encounter: Payer: Self-pay | Admitting: Internal Medicine

## 2010-09-05 ENCOUNTER — Telehealth (INDEPENDENT_AMBULATORY_CARE_PROVIDER_SITE_OTHER): Payer: Self-pay

## 2010-09-15 ENCOUNTER — Encounter: Payer: Self-pay | Admitting: Internal Medicine

## 2010-09-16 ENCOUNTER — Encounter: Payer: Self-pay | Admitting: Adult Health

## 2010-09-16 ENCOUNTER — Ambulatory Visit
Admission: RE | Admit: 2010-09-16 | Discharge: 2010-09-16 | Payer: Self-pay | Source: Home / Self Care | Attending: Adult Health | Admitting: Adult Health

## 2010-09-16 LAB — CONVERTED CEMR LAB
Basophils Absolute: 0 10*3/uL (ref 0.0–0.1)
Basophils Relative: 0 % (ref 0–1)
CO2: 22 meq/L (ref 19–32)
Calcium: 9.3 mg/dL (ref 8.4–10.5)
Creatinine, Ser: 1.14 mg/dL (ref 0.40–1.20)
Eosinophils Absolute: 0 10*3/uL (ref 0.0–0.7)
Eosinophils Relative: 1 % (ref 0–5)
Glucose, Bld: 111 mg/dL — ABNORMAL HIGH (ref 70–99)
HCT: 39.8 % (ref 36.0–46.0)
Hemoglobin: 12.5 g/dL (ref 12.0–15.0)
MCHC: 31.4 g/dL (ref 30.0–36.0)
Monocytes Absolute: 0.5 10*3/uL (ref 0.1–1.0)
RDW: 17.9 % — ABNORMAL HIGH (ref 11.5–15.5)

## 2010-09-17 ENCOUNTER — Telehealth (INDEPENDENT_AMBULATORY_CARE_PROVIDER_SITE_OTHER): Payer: Self-pay | Admitting: *Deleted

## 2010-09-17 ENCOUNTER — Telehealth: Payer: Self-pay | Admitting: Internal Medicine

## 2010-09-18 ENCOUNTER — Ambulatory Visit: Admit: 2010-09-18 | Payer: Self-pay | Admitting: Internal Medicine

## 2010-09-18 ENCOUNTER — Encounter: Payer: Self-pay | Admitting: Internal Medicine

## 2010-09-23 ENCOUNTER — Ambulatory Visit
Admission: RE | Admit: 2010-09-23 | Disposition: A | Discharge status: Other Acute Inpatient Hospital | Payer: Self-pay | Source: Home / Self Care | Attending: Cardiology | Admitting: Cardiology

## 2010-09-23 ENCOUNTER — Inpatient Hospital Stay (HOSPITAL_COMMUNITY)
Admission: RE | Admit: 2010-09-23 | Discharge: 2010-10-04 | Payer: Self-pay | Attending: Cardiology | Admitting: Cardiology

## 2010-09-24 ENCOUNTER — Encounter: Payer: Self-pay | Admitting: Internal Medicine

## 2010-09-25 ENCOUNTER — Encounter: Payer: Self-pay | Admitting: Cardiology

## 2010-09-25 LAB — COMPREHENSIVE METABOLIC PANEL
ALT: 25 U/L (ref 0–35)
AST: 21 U/L (ref 0–37)
Albumin: 2.8 g/dL — ABNORMAL LOW (ref 3.5–5.2)
Alkaline Phosphatase: 89 U/L (ref 39–117)
BUN: 25 mg/dL — ABNORMAL HIGH (ref 6–23)
CO2: 25 mEq/L (ref 19–32)
Calcium: 9.1 mg/dL (ref 8.4–10.5)
Chloride: 102 mEq/L (ref 96–112)
Creatinine, Ser: 1.14 mg/dL (ref 0.4–1.2)
GFR calc Af Amer: 56 mL/min — ABNORMAL LOW (ref >60)
GFR calc non Af Amer: 47 mL/min — ABNORMAL LOW (ref >60)
Glucose, Bld: 127 mg/dL — ABNORMAL HIGH (ref 70–99)
Potassium: 4 mEq/L (ref 3.5–5.1)
Sodium: 136 mEq/L (ref 135–145)
Total Bilirubin: 0.6 mg/dL (ref 0.3–1.2)
Total Protein: 6.4 g/dL (ref 6.0–8.3)

## 2010-09-25 LAB — MRSA PCR SCREENING: MRSA by PCR: NEGATIVE

## 2010-09-25 LAB — CBC
HCT: 37.7 % (ref 36.0–46.0)
HCT: 33.4 % — ABNORMAL LOW (ref 36.0–46.0)
Hemoglobin: 12.2 g/dL (ref 12.0–15.0)
Hemoglobin: 11.2 g/dL — ABNORMAL LOW (ref 12.0–15.0)
MCH: 25.3 pg — ABNORMAL LOW (ref 26.0–34.0)
MCH: 26 pg (ref 26.0–34.0)
MCHC: 32.4 g/dL (ref 30.0–36.0)
MCHC: 33.5 g/dL (ref 30.0–36.0)
MCV: 78.2 fL (ref 78.0–100.0)
MCV: 77.5 fL — ABNORMAL LOW (ref 78.0–100.0)
Platelets: 189 10*3/uL (ref 150–400)
Platelets: 155 10*3/uL (ref 150–400)
RBC: 4.82 MIL/uL (ref 3.87–5.11)
RBC: 4.31 MIL/uL (ref 3.87–5.11)
RDW: 17.4 % — ABNORMAL HIGH (ref 11.5–15.5)
RDW: 17.4 % — ABNORMAL HIGH (ref 11.5–15.5)
WBC: 5.2 10*3/uL (ref 4.0–10.5)
WBC: 4 10*3/uL (ref 4.0–10.5)

## 2010-09-25 LAB — BRAIN NATRIURETIC PEPTIDE: Pro B Natriuretic peptide (BNP): 339 pg/mL — ABNORMAL HIGH (ref 0.0–100.0)

## 2010-09-25 LAB — HEPARIN LEVEL (UNFRACTIONATED)
Heparin Unfractionated: 0.34 IU/mL (ref 0.30–0.70)
Heparin Unfractionated: 0.39 IU/mL (ref 0.30–0.70)

## 2010-09-28 ENCOUNTER — Encounter: Payer: Self-pay | Admitting: Internal Medicine

## 2010-09-29 ENCOUNTER — Encounter: Payer: Self-pay | Admitting: Family Medicine

## 2010-09-29 ENCOUNTER — Encounter: Payer: Self-pay | Admitting: Internal Medicine

## 2010-09-30 LAB — URINE MICROSCOPIC-ADD ON

## 2010-09-30 LAB — BASIC METABOLIC PANEL
BUN: 18 mg/dL (ref 6–23)
CO2: 29 mEq/L (ref 19–32)
CO2: 31 mEq/L (ref 19–32)
Calcium: 8.7 mg/dL (ref 8.4–10.5)
Chloride: 96 mEq/L (ref 96–112)
Chloride: 97 mEq/L (ref 96–112)
Creatinine, Ser: 1.33 mg/dL — ABNORMAL HIGH (ref 0.4–1.2)
GFR calc non Af Amer: 34 mL/min — ABNORMAL LOW (ref >60)
Glucose, Bld: 99 mg/dL (ref 70–99)
Glucose, Bld: 101 mg/dL — ABNORMAL HIGH (ref 70–99)

## 2010-09-30 LAB — CBC
HCT: 34.3 % — ABNORMAL LOW (ref 36.0–46.0)
HCT: 33.2 % — ABNORMAL LOW (ref 36.0–46.0)
HCT: 34.6 % — ABNORMAL LOW (ref 36.0–46.0)
Hemoglobin: 11.9 g/dL — ABNORMAL LOW (ref 12.0–15.0)
Hemoglobin: 11.2 g/dL — ABNORMAL LOW (ref 12.0–15.0)
MCH: 25.4 pg — ABNORMAL LOW (ref 26.0–34.0)
MCH: 25.7 pg — ABNORMAL LOW (ref 26.0–34.0)
MCHC: 32.1 g/dL (ref 30.0–36.0)
MCHC: 33.8 g/dL (ref 30.0–36.0)
MCHC: 32.4 g/dL (ref 30.0–36.0)
MCV: 79.2 fL (ref 78.0–100.0)
MCV: 79 fL (ref 78.0–100.0)
Platelets: 133 10*3/uL — ABNORMAL LOW (ref 150–400)
Platelets: 156 10*3/uL (ref 150–400)
RBC: 4.33 MIL/uL (ref 3.87–5.11)
RBC: 4.2 MIL/uL (ref 3.87–5.11)
RDW: 17.8 % — ABNORMAL HIGH (ref 11.5–15.5)
WBC: 4 10*3/uL (ref 4.0–10.5)
WBC: 5.1 10*3/uL (ref 4.0–10.5)
WBC: 4.6 10*3/uL (ref 4.0–10.5)

## 2010-09-30 LAB — URINALYSIS, ROUTINE W REFLEX MICROSCOPIC
Bilirubin Urine: NEGATIVE
Hgb urine dipstick: NEGATIVE
Nitrite: POSITIVE — AB
Specific Gravity, Urine: 1.016 (ref 1.005–1.030)
Urine Glucose, Fasting: NEGATIVE mg/dL
pH: 6.5 (ref 5.0–8.0)

## 2010-09-30 LAB — COMPREHENSIVE METABOLIC PANEL
AST: 26 U/L (ref 0–37)
Alkaline Phosphatase: 79 U/L (ref 39–117)
CO2: 28 mEq/L (ref 19–32)
Calcium: 9 mg/dL (ref 8.4–10.5)
Chloride: 100 mEq/L (ref 96–112)
GFR calc non Af Amer: 36 mL/min — ABNORMAL LOW (ref >60)
Glucose, Bld: 98 mg/dL (ref 70–99)
Total Protein: 5.9 g/dL — ABNORMAL LOW (ref 6.0–8.3)

## 2010-09-30 LAB — HEPARIN LEVEL (UNFRACTIONATED): Heparin Unfractionated: 0.33 IU/mL (ref 0.30–0.70)

## 2010-09-30 LAB — FERRITIN: Ferritin: 18 ng/mL (ref 10–291)

## 2010-09-30 LAB — T4, FREE: Free T4: 1.17 ng/dL (ref 0.80–1.80)

## 2010-09-30 LAB — IRON AND TIBC: UIBC: 277 ug/dL

## 2010-10-01 LAB — CBC
HCT: 33 % — ABNORMAL LOW (ref 36.0–46.0)
Hemoglobin: 11.4 g/dL — ABNORMAL LOW (ref 12.0–15.0)
Hemoglobin: 10.8 g/dL — ABNORMAL LOW (ref 12.0–15.0)
MCH: 25.1 pg — ABNORMAL LOW (ref 26.0–34.0)
MCH: 25.5 pg — ABNORMAL LOW (ref 26.0–34.0)
MCH: 25.8 pg — ABNORMAL LOW (ref 26.0–34.0)
MCV: 78.8 fL (ref 78.0–100.0)
Platelets: 124 10*3/uL — ABNORMAL LOW (ref 150–400)
RBC: 4.42 MIL/uL (ref 3.87–5.11)
RBC: 4.47 MIL/uL (ref 3.87–5.11)
RBC: 4.19 MIL/uL (ref 3.87–5.11)
WBC: 5.8 10*3/uL (ref 4.0–10.5)

## 2010-10-01 LAB — BASIC METABOLIC PANEL
BUN: 15 mg/dL (ref 6–23)
CO2: 29 mEq/L (ref 19–32)
CO2: 29 mEq/L (ref 19–32)
Calcium: 8.7 mg/dL (ref 8.4–10.5)
Chloride: 100 mEq/L (ref 96–112)
Chloride: 101 mEq/L (ref 96–112)
Creatinine, Ser: 1.25 mg/dL — ABNORMAL HIGH (ref 0.4–1.2)
Creatinine, Ser: 1.25 mg/dL — ABNORMAL HIGH (ref 0.4–1.2)
GFR calc Af Amer: 51 mL/min — ABNORMAL LOW (ref >60)
GFR calc Af Amer: 52 mL/min — ABNORMAL LOW (ref >60)
GFR calc non Af Amer: 43 mL/min — ABNORMAL LOW (ref >60)
Sodium: 137 mEq/L (ref 135–145)
Sodium: 134 mEq/L — ABNORMAL LOW (ref 135–145)

## 2010-10-01 LAB — HEPARIN INDUCED THROMBOCYTOPENIA PNL
Heparin Induced Plt Ab: NEGATIVE
Patient O.D.: 0.169
UFH Low Dose 0.1 IU/mL: 0 % Release

## 2010-10-01 LAB — URINE CULTURE
Colony Count: >=100000
Culture  Setup Time: 201201201819

## 2010-10-01 LAB — POCT I-STAT 3, ART BLOOD GAS (G3+)
TCO2: 30 mmol/L (ref 0–100)
pCO2 arterial: 39.9 mmHg (ref 35.0–45.0)
pH, Arterial: 7.468 — ABNORMAL HIGH (ref 7.350–7.400)

## 2010-10-01 LAB — HEPARIN LEVEL (UNFRACTIONATED)
Heparin Unfractionated: 0.48 IU/mL (ref 0.30–0.70)
Heparin Unfractionated: 0.19 IU/mL — ABNORMAL LOW (ref 0.30–0.70)

## 2010-10-01 LAB — POCT I-STAT 3, VENOUS BLOOD GAS (G3P V)
Bicarbonate: 23.1 mEq/L (ref 20.0–24.0)
O2 Saturation: 60 %
TCO2: 24 mmol/L (ref 0–100)
pCO2, Ven: 44.3 mmHg — ABNORMAL LOW (ref 45.0–50.0)
pH, Ven: 7.351 — ABNORMAL HIGH (ref 7.250–7.300)
pH, Ven: 7.353 — ABNORMAL HIGH (ref 7.250–7.300)
pO2, Ven: 34 mmHg (ref 30.0–45.0)

## 2010-10-02 ENCOUNTER — Other Ambulatory Visit: Payer: Self-pay | Admitting: Gastroenterology

## 2010-10-02 ENCOUNTER — Encounter: Payer: Self-pay | Admitting: Internal Medicine

## 2010-10-02 LAB — PROTIME-INR: INR: 1 (ref 0.00–1.49)

## 2010-10-02 LAB — CBC
HCT: 33.5 % — ABNORMAL LOW (ref 36.0–46.0)
HCT: 32.9 % — ABNORMAL LOW (ref 36.0–46.0)
MCH: 25.7 pg — ABNORMAL LOW (ref 26.0–34.0)
MCH: 25.7 pg — ABNORMAL LOW (ref 26.0–34.0)
MCHC: 32.2 g/dL (ref 30.0–36.0)
MCHC: 32.8 g/dL (ref 30.0–36.0)
MCV: 79.6 fL (ref 78.0–100.0)
MCV: 78.3 fL (ref 78.0–100.0)
Platelets: 146 10*3/uL — ABNORMAL LOW (ref 150–400)
RDW: 17.9 % — ABNORMAL HIGH (ref 11.5–15.5)
RDW: 18 % — ABNORMAL HIGH (ref 11.5–15.5)
WBC: 4.2 10*3/uL (ref 4.0–10.5)

## 2010-10-02 LAB — BASIC METABOLIC PANEL
BUN: 10 mg/dL (ref 6–23)
CO2: 29 mEq/L (ref 19–32)
Chloride: 101 mEq/L (ref 96–112)
Creatinine, Ser: 1.14 mg/dL (ref 0.4–1.2)
Potassium: 4.6 mEq/L (ref 3.5–5.1)

## 2010-10-03 LAB — PROTIME-INR
INR: 1.03 (ref 0.00–1.49)
Prothrombin Time: 13.7 seconds (ref 11.6–15.2)

## 2010-10-03 NOTE — Procedures (Addendum)
NAMEMACAYLA, [name] NO.:  [id number]  MEDICAL RECORD NO.:  [id number]          PATIENT TYPE:  INP  LOCATION:  2928                         FACILITY:  [facility]  PHYSICIAN:  Shaune Pascal. Bensimhon, MDDATE OF BIRTH:  11-22-1933  DATE OF PROCEDURE:  09/30/2010 DATE OF DISCHARGE:                           CARDIAC CATHETERIZATION   PRIMARY CARE PHYSICIAN:  Halford Chessman, MD  CARDIOLOGIST:  Loretha Brasil. Lia Foyer, MD, [facility]  PULMONOLOGIST:  Kasandra Knudsen. Annamaria Boots, MD, FCCP, FACP  INDICATIONS:  [name] is a 77 year old woman who has been experiencing progressive dyspnea.  Echocardiogram revealed LV dysfunction with an EF in the 25-30% range as well as significant mitral regurgitation.  She has been diuresed and feels better and referred for heart catheterization.  PROCEDURE PERFORMED: 1. Right heart catheterization. 2. Left heart catheterization. 3. Selective coronary angiography. 4. Left ventriculogram. 5. Aortic root angiogram.OPERATOR:  Shaune Pascal. Bensimhon, MD  DESCRIPTION OF PROCEDURE:  The risks and indications were explained. Consent was signed and placed in the chart.  Right groin area was prepped and draped in routine sterile fashion and anesthetized with 1% local lidocaine.  A 7-French venous sheath was placed in the right femoral vein.  A standard Swan-Ganz catheter was used.  A 5-French arterial sheath was placed in the right femoral artery and a JL-4 and angled pigtail were used.  There are no apparent complications.  FINDINGS:  Central aortic pressure 122/67 with a mean of 90.  LV pressure 124/13 with an EDP of 22.  Right atrial pressure mean of 7.  RV pressure 39/6 with an EDP of 9.  PA pressure of 47/25 with a mean of 36. Pulmonary capillary wedge pressure mean of 23 with V-waves to 35.  Fick cardiac output 5.6.  Cardiac index 2.8.  Pulmonary vascular resistance 4.5 Woods units.  Left main was normal.  LAD is a long vessel that coursed to the apex and  gave off two diagonal branches.  There was mild plaquing in the midsection about 30%.  Left circumflex gave off normal ramus branch and OM-1.  The AV groove circumflex was large and gave off the PDA and then back-filled most of the right coronary artery.  This was angiographically normal.  Right coronary artery, we were unable to cannulate this selectively.  We did a root shot and there appeared to be an anomalous, very tiny small right coronary artery which may only fed the conus branch.  This was not well visualized.  Left ventriculogram done in the RAO position showed an EF of 25-30%. There was global hypokinesis with anterior dyskinesis.  Mitral regurgitation was not well seen.  ASSESSMENT: 1. Minimal nonobstructive coronary artery disease as described above. 2. 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 3. Left ventricular dysfunction with an ejection fraction of 25-30%     secondary to nonischemic cardiomyopathy.  There is significant mitral regurgitation by echocardiogram and on the right heart catheterization.  PLAN/DISCUSSION:  At this point, we will continue with medical therapy.  Question need for a TEE to further assess the mitral valve.  I will discuss with Dr. Lia Foyer.     Shaune Pascal. Bensimhon, MD     DRB/MEDQ  D:  09/30/2010  T:  09/30/2010  Job:  [id number]  cc:   Halford Chessman, M.D. [place] D. Annamaria Boots, MD, FCCP, [place] Lia Foyer, MD, [facility]  Electronically Signed by Glori Bickers MD on 10/03/2010 04:04:45 PM

## 2010-10-04 LAB — CBC
Hemoglobin: 10.3 g/dL — ABNORMAL LOW (ref 12.0–15.0)
MCV: 78.7 fL (ref 78.0–100.0)
Platelets: 149 10*3/uL — ABNORMAL LOW (ref 150–400)
RBC: 4.03 MIL/uL (ref 3.87–5.11)
WBC: 3.5 10*3/uL — ABNORMAL LOW (ref 4.0–10.5)

## 2010-10-04 LAB — BASIC METABOLIC PANEL
Calcium: 9 mg/dL (ref 8.4–10.5)
GFR calc Af Amer: 50 mL/min — ABNORMAL LOW (ref >60)
GFR calc non Af Amer: 41 mL/min — ABNORMAL LOW (ref >60)
Potassium: 3.9 mEq/L (ref 3.5–5.1)
Sodium: 136 mEq/L (ref 135–145)

## 2010-10-04 LAB — PROTIME-INR: Prothrombin Time: 13.3 seconds (ref 11.6–15.2)

## 2010-10-07 ENCOUNTER — Ambulatory Visit
Admission: RE | Admit: 2010-10-07 | Discharge: 2010-10-07 | Payer: Self-pay | Source: Home / Self Care | Attending: Cardiology | Admitting: Cardiology

## 2010-10-07 LAB — CONVERTED CEMR LAB: POC INR: 1.3

## 2010-10-08 ENCOUNTER — Telehealth: Payer: Self-pay | Admitting: Internal Medicine

## 2010-10-08 NOTE — Medication Information (Signed)
Summary: Xolair approved/[facility]  Xolair approved/[facility]   Imported By: Bubba Hales 09/17/2009 10:48:36  _____________________________________________________________________  External Attachment:    Type:   Image     Comment:   External Document

## 2010-10-08 NOTE — Assessment & Plan Note (Signed)
Summary: 3 week follow-up//jrc   Copy to:  Dr. [name] Primary Provider/Referring Provider:  Dr. Sharilyn Sites  CC:  3 week follow up visit-chest congestion and hoarseness-better than 3 weeks ago but still there.Marland Kitchen  History of Present Illness:  November 29, 2009- Chronic asthma/ COPD, Chronic rhinosinusitis We called in a prednisone taper during an exacerbation 2-3 weeks ago she attributed to "allergy". Off prednisone for 2 weeks. She is at her baseline. Uses nebulizer about 3 x daily for chest tightness. cough is productive, clear. Denies chest pain, fever, ankle edema.  May 28, 2010- Chronic asthma/ COPD, Chronic rhinosinusitis Continues Xolair. We called in Septra to hold during a flare of sinus disease in May- says she thinks she took it then. Pretty good summer. 3 days of increased SOB , tightness and wheeze. Was outside for 2 days just before this. Doubts a cold. Last prednisone in February. Using rescue inhaler less than once/ week. With this acute iillness she is using her nebulizer three times a day.  Denies chest pain, palpitation, syncope, swelling, fever, blood or pain.  June 20, 2010- Chronic asthma/ COPD, Chronic rhinosinusitis, AFib Nurse CC: 3 week follow up visit-chest congestion and hoarseness-better than 3 weeks ago but still there. Atrial fib noted last time and addressed by Dr Domenic Polite. Since then she feels "anxious", but not palpitation. Now on cardizem. She couldn't breath well on prednisone 10 mg aftrer last here. She completed a burst and taper 2 days ago and so far feels breathing is good.      Asthma History    Asthma Control Assessment:    Age range: 12+ years    Symptoms: 0-2 days/week    Nighttime Awakenings: 0-2/month    Interferes w/ normal activity: some limitations    SABA use (not for EIB): >2 days/week    Asthma Control Assessment: Not Well Controlled   Preventive Screening-Counseling & Management  Alcohol-Tobacco     Smoking  Status: quit     Year Quit: 1980     Pack years: 10-15  Current Medications (verified): 1)  Lipitor 10 Mg  Tabs (Atorvastatin Calcium) .... Take 1 Tablet By Mouth Once A Day 2)  Nexium 40 Mg  Cpdr (Esomeprazole Magnesium) .... Take 1 Capsule By Mouth Two Times A Day 3)  Caltrate 600+d 600-400 Mg-Unit  Tabs (Calcium Carbonate-Vitamin D) .... Take 1 Tablet By Mouth Once A Day 4)  Lasix 40 Mg  Tabs (Furosemide) .... As Needed 5)  Klor-Con M20 20 Meq  Tbcr (Potassium Chloride Crys Cr) .Marland Kitchen.. 1 With Each Lasix Dose 6)  Xolair 150 Mg  Solr (Omalizumab) .... 300mg  Every 4 Weeks 7)  Albuterol Sulfate (2.5 Mg/85ml) 0.083%  Nebu (Albuterol Sulfate) .... As Needed 8)  Proair Hfa 108 (90 Base) Mcg/act  Aers (Albuterol Sulfate) .Marland Kitchen.. 1-2 Puffs Every 4-6 Hours As Needed 9)  Twinject Epinephrine 10)  Nasonex 50 Mcg/act Susp (Mometasone Furoate) .... 2 Sprays Each Nostril Once Daily1 11)  Prednisone 10 Mg Tabs (Prednisone) .... Take 1 Tablet By Mouth Once A Day Until 06-20-10 Appt With Dr. Annamaria Boots 12)  Vitamin D 1000 Unit Tabs (Cholecalciferol) .... Take 1 Tab Daily 13)  Cardizem Cd 120 Mg Xr24h-Cap (Diltiazem Hcl Coated Beads) .... Take 1 Tablet By Mouth Once Daily 14)  Prednisone 10 Mg Tabs (Prednisone) .... Take 4x 2 Days, 3 X 2days, 2 X 2days, 1 X 2days Then Stop  Allergies (verified): 1)  ! Penicillin 2)  ! Doxycycline 3)  ! * Clindamycin  4)  ! Augmentin 5)  ! Biaxin  Past History:  Past Medical History: Last updated: 05/31/2010 Asthma Atrial Fibrillation C O P D G E R D Mitral Valve Prolapse - moderate mitral regurgitation 2007 (cardiac catheterization, Dr. Jaci Standard) Nasal polyp Chronic rhinitis Obstructive sleep apnea  Past Surgical History: Last updated: 05/31/2010 Laparoscopic Nissen fundoplication [id number] Appendectomy 10 years ago Nasal surgery Bilateral cataract surgery  Family History: Last updated: 2010/07/09 Both parents died colon cancer Brother smoked - died of lung  cancer Brother smoked - now has lung cancer Sister- Has AFib/ defibrillator  Social History: Last updated: 05/31/2010 Tobacco Use - Former (quit 30 years ago) Alcohol Use - no Single   Risk Factors: Smoking Status: quit (2010/07/09)  Family History: Both parents died colon cancer Brother smoked - died of lung cancer Brother smoked - now has lung cancer Sister- Has AFib/ defibrillator  Vital Signs:  Patient profile:   77 year old female Height:      67 inches Weight:      204.25 pounds BMI:     32.11 O2 Sat:      96 % on Room air Pulse rate:   88 / minute BP sitting:   124 / 72  (left arm) Cuff size:   regular  Vitals Entered By: Clayborne Dana CMA 07-09-10 4:21 PM)  O2 Flow:  Room air CC: 3 week follow up visit-chest congestion and hoarseness-better than 3 weeks ago but still there.   Physical Exam  Additional Exam:  General: A/Ox3; pleasant and cooperative, NAD, SKIN: no rash, lesions NODES: no lymphadenopathy HEENT: [place]/AT, EOM- WNL, Conjuctivae- clear,  ., PERRLA, TM-WNL, Nose-no visible polyps,  ., Throat- clear and wnl, Mallampatii II, dentures NECK: Supple w/ fair ROM, JVD- none, normal carotid impulses w/o bruits Thyroid-  CHEST:Unlabored without wheeze.  HEART: irregularlyirregular no m/g/r heard. ABDOMEN: soft, nontender [id number], nl pulses, no edema  NEURO: Grossly intact to observation      Impression & Recommendations:  Problem # 1:  EXTRINSIC ASTHMA, UNSPECIFIED (ICD-493.00) I will try Xopenex neb solution instead of her albuterol, hoping for less sympathetic stimulation of her heart rate.  We will see if she can stay off prednisone now, but give standby script to hold.   Problem # 2:  RHINOSINUSITIS, CHRONIC (ICD-473.8) She doesn't actively note symptoms much currently. She was never willing to consider sinus surgery with Dr Erik Obey.  Problem # 3:  Hx of BARRETT'S ESOPHAGUS, HX OF (ICD-V12.79) GERD symptoms are under better control  in last year or so.  Medications Added to Medication List This Visit: 1)  Xopenex 1.25 Mg/83ml Nebu (Levalbuterol hcl) .Marland Kitchen.. 1neb  three times a day as needed  Other Orders: Est. Patient Level III [id number])  Patient Instructions: 1)  Please schedule a follow-up appointment in 2 months. 2)  Script to try Xopenex instead of albuterol as your nebulizer medicine. Since it costs more, we won't stay with it unless it clearly makes you less nervous. Call if it works well so we can send script.  Prescriptions: PREDNISONE 10 MG TABS (PREDNISONE) take 4x 2 days, 3 x 2days, 2 x 2days, 1 x 2days then stop  #20 x 0   Entered and Authorized by:   Deneise Lever MD   Signed by:   Deneise Lever MD on July 09, 2010   Method used:   Print then Give to Patient   RxID:   [id number] XOPENEX 1.25 MG/3ML NEBU (LEVALBUTEROL HCL) 1neb  three times a  day as needed  #25 x prn   Entered and Authorized by:   Deneise Lever MD   Signed by:   Deneise Lever MD on 06/20/2010   Method used:   Print then Give to Patient   RxID:   [id number]     Appended Document: 3 week follow-up//jrc     Allergies: 1)  ! Penicillin 2)  ! Doxycycline 3)  ! * Clindamycin 4)  ! Augmentin 5)  ! Biaxin   Other Orders: Administration xolair injection [phone])   Medication Administration  Injection # 1:    Medication: Xolair (omalizumab) 150mg     Diagnosis: EXTRINSIC ASTHMA, UNSPECIFIED (ICD-493.00)    Route: IM    Site: R deltoid    Exp Date: 06/08/2013    Lot #: [id number]    Mfr: Genetech    Comments: xolair 300 mg, unit 60, 1.2 ml in right deltoid and 1.2 ml in left deltoid. Patient didn't wait.    Given by: Alroy Bailiff, allergy tech  Orders Added: 1)  Administration xolair injection [phone]

## 2010-10-08 NOTE — Assessment & Plan Note (Signed)
Summary: 1 MONTH XOLAIR///KP  Nurse Visit   Allergies: 1)  ! Penicillin 2)  ! Doxycycline 3)  ! * Clindamycin 4)  ! Augmentin 5)  ! Biaxin  Medication Administration  Injection # 1:    Medication: Xolair (omalizumab) 150mg     Diagnosis: EXTRINSIC ASTHMA, UNSPECIFIED (ICD-493.00)    Route: SQ    Site: L deltoid    Exp Date: 01/06/2013    Lot #: [id number]    Mfr: GENENTECH    Comments: 1.2 ML IN LEFT AND RIGHT ARM PT DIDNT WAIT    Given by: TAMMY SCOTT IN ALLERGY LAB   Medication Administration  Injection # 1:    Medication: Xolair (omalizumab) 150mg     Diagnosis: EXTRINSIC ASTHMA, UNSPECIFIED (ICD-493.00)    Route: SQ    Site: L deltoid    Exp Date: 01/06/2013    Lot #: [id number]    Mfr: GENENTECH    Comments: 1.2 ML IN LEFT AND RIGHT ARM PT DIDNT WAIT    Given by: TAMMY SCOTT IN ALLERGY LAB

## 2010-10-08 NOTE — Miscellaneous (Signed)
Summary: Injection Record / [facility]    Injection Record / [facility]    Imported By: Rise Patience 01/28/2010 14:56:43  _____________________________________________________________________  External Attachment:    Type:   Image     Comment:   External Document

## 2010-10-08 NOTE — Letter (Signed)
Summary: EKG 05/28/10  EKG 05/28/10   Imported By: Alphonsus Sias [facility] 06/04/2010 16:18:49  _____________________________________________________________________  External Attachment:    Type:   Image     Comment:   External Document

## 2010-10-08 NOTE — Progress Notes (Signed)
Summary: rx  Phone Note Call from Patient Call back at Home Phone [phone]   Caller: Patient Call For: Lauralee Waters Reason for Call: Talk to Nurse Summary of Call: cough, head congestion, drainage, yellow when blows.  Can you call something in? [place] Initial call taken by: Zigmund Gottron,  Jan 28, 2010 11:56 AM  Follow-up for Phone Call        pt c/o productive cough with white phlegm, head congestion and pressure, blowing yellow mucus from nose since this weekend.  please advise. [place] Bing CMA  Jan 28, 2010 12:02 PM allergies: 1)  ! Penicillin 2)  ! Doxycycline 3)  ! * Clindamycin 4)  ! Augmentin 5)  ! Biaxin  Additional Follow-up for Phone Call Additional follow up Details #1::        Not sure this is bacterial infection vs allergy or virus. She can use mucinex, afrin nasal spray, Neti pot. If she thinks it is infection, offer Septra DS, # 14, 1 two times a day x 7 days. Additional Follow-up by: Deneise Lever MD,  Jan 28, 2010 1:34 PM    Additional Follow-up for Phone Call Additional follow up Details #2::    pt advised of recs. pt states she will tro OTC meds and if no relief then will fill the abx. pt request it go ahead and be sent to [facility]. rx sent. [place] Bing CMA  Jan 28, 2010 1:37 PM   New/Updated Medications: SEPTRA DS 800-160 MG TABS (SULFAMETHOXAZOLE-TRIMETHOPRIM) Take 1 tablet by mouth two times a day Prescriptions: SEPTRA DS 800-160 MG TABS (SULFAMETHOXAZOLE-TRIMETHOPRIM) Take 1 tablet by mouth two times a day  #14 x 0   Entered by:   [place] Bing CMA   Authorized by:   Deneise Lever MD   Signed by:    Bing CMA on 01/28/2010   Method used:   Electronically to        [facility] (retail)       [place] [address]       [place], [place]  [address]       Ph: [phone]       Fax: [phone]   RxID:   [id number]

## 2010-10-08 NOTE — Assessment & Plan Note (Signed)
Summary: sob/swelling in legs//jrc   Copy to:  Dr. [name] Primary Provider/Referring Provider:  Dr. Sharilyn Sites  CC:  Acute Visit , pt c/o increase swelling in lower extremities, increase sob , chest tightness x 1 week, and HR irregular 135-144.  History of Present Illness:  May 28, 2010- Chronic asthma/ COPD, Chronic rhinosinusitis Continues Xolair. We called in Septra to hold during a flare of sinus disease in May- says she thinks she took it then. Pretty good summer. 3 days of increased SOB , tightness and wheeze. Was outside for 2 days just before this. Doubts a cold. Last prednisone in February. Using rescue inhaler less than once/ week. With this acute iillness she is using her nebulizer three times a day.  Denies chest pain, palpitation, syncope, swelling, fever, blood or pain.  June 20, 2010- Chronic asthma/ COPD, Chronic rhinosinusitis, AFib Nurse CC: 3 week follow up visit-chest congestion and hoarseness-better than 3 weeks ago but still there. Atrial fib noted last time and addressed by Dr Domenic Polite. Since then she feels "anxious", but not palpitation. Now on cardizem. She couldn't breath well on prednisone 10 mg aftrer last here. She completed a burst and taper 2 days ago and so far feels breathing is good.   July 10, 2010- Chronic asthma/ COPD, Chronic rhinosinusitis, AFib Nurse-CC: Acute Visit , pt c/o increase swelling in lower extremities, increase sob , chest tightness x 1 week, HR irregular 135-144 Gradually more aware of swelling and chest congestion over last two weeks. Was better while on prednisone after last here. She relates swelling to her use of cardizem over the same length of tme.  Echo showed moderate effusion, stiff LV, LAE, EF 45%.  Dr Domenic Polite intended increase to Cardizem 180 but she never got that script. Dr Armandina Gemma gave her [facility] sample which didn't help. Ran out of lasix 40 mg/day yesterday.    Asthma History    Asthma Control  Assessment:    Age range: 12+ years    Symptoms: throughout the day    Nighttime Awakenings: 0-2/month    Interferes w/ normal activity: some limitations    SABA use (not for EIB): several times per day    Asthma Control Assessment: Very Poorly Controlled   Preventive Screening-Counseling & Management  Alcohol-Tobacco     Smoking Status: quit     Year Quit: 1980     Pack years: 10-15  Current Medications (verified): 1)  Lipitor 10 Mg  Tabs (Atorvastatin Calcium) .... Take 1 Tablet By Mouth Once A Day 2)  Nexium 40 Mg  Cpdr (Esomeprazole Magnesium) .... Take 1 Capsule By Mouth Two Times A Day 3)  Caltrate 600+d 600-400 Mg-Unit  Tabs (Calcium Carbonate-Vitamin D) .... Take 1 Tablet By Mouth Once A Day 4)  Lasix 40 Mg  Tabs (Furosemide) .... As Needed 5)  Klor-Con M20 20 Meq  Tbcr (Potassium Chloride Crys Cr) .Marland Kitchen.. 1 With Each Lasix Dose 6)  Xolair 150 Mg  Solr (Omalizumab) .... 300mg  Every 4 Weeks 7)  Xopenex 1.25 Mg/16ml Nebu (Levalbuterol Hcl) .Marland Kitchen.. 1neb  Three Times A Day As Needed 8)  Proair Hfa 108 (90 Base) Mcg/act  Aers (Albuterol Sulfate) .Marland Kitchen.. 1-2 Puffs Every 4-6 Hours As Needed 9)  Twinject Epinephrine 10)  Nasonex 50 Mcg/act Susp (Mometasone Furoate) .... 2 Sprays Each Nostril Once Daily1 11)  Vitamin D 1000 Unit Tabs (Cholecalciferol) .... Take 1 Tab Daily 12)  Cardizem Cd 180 Mg Xr24h-Cap (Diltiazem Hcl Coated Beads) .... Take 1 Tablet  By Mouth Once Daily  Allergies (verified): 1)  ! Penicillin 2)  ! Doxycycline 3)  ! * Clindamycin 4)  ! Augmentin 5)  ! Biaxin  Past History:  Past Medical History: Last updated: 05/31/2010 Asthma Atrial Fibrillation C O P D G E R D Mitral Valve Prolapse - moderate mitral regurgitation 2007 (cardiac catheterization, Dr. Jaci Standard) Nasal polyp Chronic rhinitis Obstructive sleep apnea  Past Surgical History: Last updated: 05/31/2010 Laparoscopic Nissen fundoplication [id number] Appendectomy 10 years ago Nasal surgery Bilateral  cataract surgery  Family History: Last updated: 2010/07/20 Both parents died colon cancer Brother smoked - died of lung cancer Brother smoked - now has lung cancer Sister- Has AFib/ defibrillator  Social History: Last updated: 05/31/2010 Tobacco Use - Former (quit 30 years ago) Alcohol Use - no Single   Risk Factors: Smoking Status: quit (07/10/2010)  Review of Systems      See HPI       The patient complains of shortness of breath with activity, shortness of breath at rest, irregular heartbeats, and hand/feet swelling.  The patient denies productive cough, non-productive cough, coughing up blood, chest pain, acid heartburn, indigestion, loss of appetite, weight change, abdominal pain, difficulty swallowing, sore throat, tooth/dental problems, headaches, nasal congestion/difficulty breathing through nose, sneezing, itching, ear ache, anxiety, depression, rash, change in color of mucus, and fever.    Vital Signs:  Patient profile:   77 year old female Height:      67 inches Weight:      205.8 pounds BMI:     32.35 O2 Sat:      97 % on Room air Pulse rate:   135 / minute BP sitting:   116 / 76  (right arm)  Vitals Entered By: Stefani Dama RCP, LPN (November  2, 624THL 4:12 PM)  O2 Flow:  Room air CC: Acute Visit , pt c/o increase swelling in lower extremities, increase sob , chest tightness x 1 week, HR irregular 135-144 Comments Medications reviewed with patient Stefani Dama RCP, LPN  November  2, 624THL 4:12 PM    Physical Exam  Additional Exam:  General: A/Ox3; pleasant and cooperative, NAD, SKIN: no rash, lesions NODES: no lymphadenopathy HEENT: [place]/AT, EOM- WNL, Conjuctivae- clear,  ., PERRLA, TM-WNL, Nose-no visible polyps,  ., Throat- clear and wnl, Mallampatii II, dentures NECK: Supple w/ fair ROM, JVD- none, normal carotid impulses w/o bruits Thyroid-  CHEST:Mild wheeze, dull right base, no rub or cough  HEART: irregularlyirregular no m/g/r heard. ABDOMEN:  soft, nontender [id number], nl pulses, 3+ edema  NEURO: Grossly intact to observation      Impression & Recommendations:  Problem # 1:  Hx of PERIPHERAL EDEMA (ICD-782.3) Markedly increased- due to CCB and fluid retention. I will refill Lasix script and have her try elastic hose.  She had pleural effusion on echo and by today's exam. Will get CXR. This likely reflects total body water increase. She understands predniosne will also increase fluid retention.   Problem # 2:  BRONCHITIS, CHRONIC (ICD-491.9) Chronic asthmatic bronchitiswith exacerbation. Prednisone helps her siginficantly  but has the usual side effects. We will use it today to get control and to give her a sense that she is back in control.   Problem # 3:  ATRIAL FIBRILLATION (ICD-427.31) Pulse is slightly irregular today, c/w medcal rate control.. Recent cardiology w/u reviewed.  Medications Added to Medication List This Visit: 1)  Prednisone 10 Mg Tabs (Prednisone) .Marland Kitchen.. 1 tab four times daily x 2 days, 3 times daily  x 2 days, 2 times daily x 2 days, 1 time daily x 2 days  Other Orders: Est. Patient Level IV [id number]) T-2 View CXR ([id number])  Patient Instructions: 1)  Keep scheduled appointment- call sooner as needed 2)  script for prednisone taper    sent to drug store 3)  script to refill lasix 4)  script potassium  5)  Suggest you start trying to wear elastic socks/ knee high 6)  A chest x-ray has been recommended.  Your imaging study may require preauthorization.  Prescriptions: KLOR-CON M20 20 MEQ  TBCR (POTASSIUM CHLORIDE CRYS CR) 1 with each lasix dose  #30 x prn   Entered and Authorized by:   Deneise Lever MD   Signed by:   Deneise Lever MD on 07/10/2010   Method used:   Electronically to        [facility] (retail)       [place] [address]       [place], [place]  [address]       Ph: [phone]       Fax: [phone]   RxID:   [id number] LASIX 40 MG  TABS  (FUROSEMIDE) as needed  #30 x prn   Entered and Authorized by:   Deneise Lever MD   Signed by:   Deneise Lever MD on 07/10/2010   Method used:   Electronically to        [facility] (retail)       [place] [address]       [place], [place]  [address]       Ph: [phone]       Fax: [phone]   [id number] PREDNISONE 10 MG TABS (PREDNISONE) 1 tab four times daily x 2 days, 3 times daily x 2 days, 2 times daily x 2 days, 1 time daily x 2 days  #20 x 0   Entered and Authorized by:   Deneise Lever MD   Signed by:   Deneise Lever MD on 07/10/2010   Method used:   Electronically to        [facility] (retail)       [place] [address] [place], [place]  [address]       Ph: [phone]       Fax: [phone]   RxID:   [id number]

## 2010-10-08 NOTE — Progress Notes (Signed)
Summary: prescription inquiry  Phone Note Call from Patient Call back at Home Phone [phone]   Caller: Patient Call For: young Summary of Call: Pt states she went by [facility] and they still haven't received prescriptions. Initial call taken by: Netta Neat,  June 21, 2010 3:17 PM  Follow-up for Phone Call        I called the RXs in to pharmacy as the fax machine stayed busy x 4  times.Clayborne Dana CMA  June 21, 2010 5:08 PM   pt advised. [place] Bing CMA  June 21, 2010 5:12 PM

## 2010-10-08 NOTE — Assessment & Plan Note (Signed)
Summary: rov 4 months///kp   Primary Provider/Referring Provider:  Sharilyn Sites  CC:  4 month follow up visit.  History of Present Illness: 07/14/08 -Chronic asthma/ copd, chronic rhinosinusitis 1Asthma/ copd- Always chest congestion, but less tight. Neb makes cough loser, more productive of clear watery mucus. Off allergy vaccine now 2-3 yrs. Continues Xolair.Last prednisone 2 months ago. Had Flu vax.Off Advair and Symbicort, unhelpful.  GERD control good.   11/13/08- Chronic asthma/ copd, chronic rhinosinusitis May have to start paying dierectly for Xolair- needs to talk with [facility], but Xolair seems to have helped. Chronic chest tightness and low grade wheeze with little sputum. Does have to use her nebulizer after bedtime at least once. Thinks right eye droops a little and notes that eye is alway sticky closed in AM, but without burning or visible discharge.  July 31, 2009- Chronic asthma/ COPD, chronic rhinosinusitis By measures of less cough and fewer trips needed here, her Arvid Right is dong very well. She notes " allergy", meaning increased nasal congestion related to cooler weather and the leaves. Strill needs her neb as much as 3-4 times/ day. Had flu vax and has had Pneumovax at least twice.  November 29, 2009- Chronic asthma/ COPD, Chronic rhinosinusitis We called in a prednisone taper during an exacerbation 2-3 weeks ago she attributed to "allergy". Off prednisone for 2 weeks. She is at her baseline. Uses nebulizer about 3 x daily for chest tightness. cough is productive, clear. Denies chest pain, fever, ankle edema.   Current Medications (verified): 1)  Lipitor 10 Mg  Tabs (Atorvastatin Calcium) .... Take 1 Tablet By Mouth Once A Day 2)  Nexium 40 Mg  Cpdr (Esomeprazole Magnesium) .... Take 1 Capsule By Mouth Two Times A Day 3)  Caltrate 600+d 600-400 Mg-Unit  Tabs (Calcium Carbonate-Vitamin D) .... Take 1 Tablet By Mouth Once A Day 4)  Lasix 40 Mg  Tabs (Furosemide) .... As  Needed 5)  Klor-Con M20 20 Meq  Tbcr (Potassium Chloride Crys Cr) .Marland Kitchen.. 1 With Each Lasix Dose 6)  Xolair 150 Mg  Solr (Omalizumab) .... 300mg  Every 4 Weeks 7)  Albuterol Sulfate (2.5 Mg/12ml) 0.083%  Nebu (Albuterol Sulfate) .... As Needed 8)  Proair Hfa 108 (90 Base) Mcg/act  Aers (Albuterol Sulfate) .Marland Kitchen.. 1-2 Puffs Every 4-6 Hours As Needed 9)  Twinject Epinephrine 10)  Ester-C  Tabs (Bioflavonoid Products) .... Use As Directed Per Bottle 11)  Nasonex 50 Mcg/act Susp (Mometasone Furoate) .... 2 Sprays Each Nostril Once Daily1 12)  Prednisone 20 Mg Tabs (Prednisone) .... Take 2 Tabs X 2 Days, Then 1 1/2 Tabs X 2 Days, Then 1 Tab X 2 Days, Then 1/2 Tab X 2 Days Then Stop  Allergies (verified): 1)  ! Penicillin 2)  ! Doxycycline 3)  ! * Clindamycin 4)  ! Augmentin 5)  ! Biaxin  Past History:  Past Medical History: Last updated: 01/12/2008 HYPERSOMNIA WITH SLEEP APNEA UNSPECIFIED (ICD-780.53) COPD (ICD-496) ESOPHAGEAL REFLUX (ICD-530.81) MITRAL VALVE PROLAPSE (ICD-424.0) ASTHMATIC BRONCHITIS, ACUTE (ICD-466.0) NASAL POLYP (ICD-471.0) CHRONIC RHINITIS (ICD-472.0) EXTRINSIC ASTHMA, UNSPECIFIED (ICD-493.00)    Past Surgical History: Last updated: 123XX123 Nissen fundoplication 123XX123 Appendectomy 10 years ago Nasal surgery Bilateral cataract surgery  Family History: Last updated: 09/17/07  both parents died colon cancer Brother smoked - died of lung cancer Brother smoked- now has lung cancer  Social History: Last updated: 2007/09/17 Patient states former smoker quit 30 yrs ago  Risk Factors: Smoking Status: quit (09/17/07)  Review of Systems  See HPI       The patient complains of dyspnea on exertion and prolonged cough.  The patient denies anorexia, fever, weight loss, weight gain, vision loss, decreased hearing, hoarseness, chest pain, syncope, headaches, hemoptysis, abdominal pain, and severe indigestion/heartburn.    Vital Signs:  Patient profile:   77  year old female Height:      67 inches Weight:      202 pounds BMI:     31.75 O2 Sat:      97 % on Room air Pulse rate:   83 / minute BP sitting:   110 / 68  (left arm) Cuff size:   large  Vitals Entered By: Clayborne Dana CMA (November 29, 2009 11:18 AM)  O2 Flow:  Room air  Physical Exam  Additional Exam:  General: A/Ox3; pleasant and cooperative, NAD, SKIN: no rash, lesions NODES: no lymphadenopathy HEENT: [place]/AT, EOM- WNL, Conjuctivae- clear,  ., PERRLA, TM-WNL, Nose-no visible polyps,  ., Throat- clear and wnl, Mallampatii II, dentures NECK: Supple w/ fair ROM, JVD- none, normal carotid impulses w/o bruits Thyroid-  CHEST:Raspy  Very faint expiratory wheeze. HEART: RRR, no m/g/r heard ABDOMEN: soft, nontender [id number], nl pulses, no edema  NEURO: Grossly intact to observation      Impression & Recommendations:  Problem # 1:  BRONCHITIS, CHRONIC (ICD-491.9)  She remains on Xolair and is doing very much better than she had been for a long time. We decided not to make changes.  Problem # 2:  RHINOSINUSITIS, CHRONIC (ICD-473.8) She admits sosme nasal congestion but currently denies headache, purulent discharge or fever.  Other Orders: Est. Patient Level III [id number])  Patient Instructions: 1)  Please schedule a follow-up appointment in 6 months. 2)  Please let us know early if needed.

## 2010-10-08 NOTE — Assessment & Plan Note (Signed)
Summary: xolair/apc  Nurse Visit   Allergies: 1)  ! Penicillin 2)  ! Doxycycline 3)  ! * Clindamycin 4)  ! Augmentin 5)  ! Biaxin  Medication Administration  Injection # 1:    Medication: Xolair (omalizumab) 150mg     Diagnosis: EXTRINSIC ASTHMA, UNSPECIFIED (ICD-493.00)    Route: IM    Site: R deltoid    Exp Date: 03/08/2013    Lot #: [id number]    Mfr: Thedora Hinders    Comments: xolair 300 mg and 60 units 1.2 ml x 1 in right deltoid and 1.2 ml x 1 in left deltoid. Pt tolerated injection just fine.    Given by: Brock Bad, CMA AAMA  Orders Added: 1)  Administration xolair injection [phone]

## 2010-10-08 NOTE — Progress Notes (Signed)
Summary: NEED NEW RX CALLED IN  Phone Note Call from Patient Call back at Home Phone [phone]   Caller: PT Reason for Call: Refill Medication, Talk to Nurse Summary of Call: PER PT SHE NEEDS DILTIAZEM 120MG  OR 180MG  CALLED IN FOR #90 DAYS SO INSURANCE WILL PAY FOR IT. [facility]S [facility] [place]. Initial call taken by: Nevada Crane,  July 22, 2010 2:54 PM    Prescriptions: CARDIZEM CD 180 MG XR24H-CAP (DILTIAZEM HCL COATED BEADS) take 1 tablet by mouth once daily  #90 x 0   Entered by:   Jeani Hawking Via LPN   Authorized by:   Beckie Salts, MD, [facility]   Signed by:   Jeani Hawking Via LPN on [id number]   Method used:   Electronically to        [facility] [phone]* (retail)       [address]       [place], [place]  [address]       Ph: [phone] or [phone]       Fax: [phone]   RxID:   [id number]

## 2010-10-08 NOTE — Medication Information (Signed)
Summary: [facility]  [facility]air/[facility]   Imported By: Phillis Knack 12/06/2009 12:16:58  _____________________________________________________________________  External Attachment:    Type:   Image     Comment:   External Document

## 2010-10-08 NOTE — Progress Notes (Signed)
Summary: sick  Phone Note Call from Patient Call back at [facility] Phone [phone]   Caller: Patient Call For: young Reason for Call: Talk to Nurse Summary of Call: chest congested, rattling, wheezing.  Have a funeral Friday, can't come in, can you call something in? [place] Initial call taken by: Zigmund Gottron,  October 31, 2009 4:56 PM  Follow-up for Phone Call        called spoke  with patient who c/o chest congestion with rattling, tightness in chest, wheezing, some dyspnea x3days - denies any cough, f/c/s.  pt states that CDY usually gives her prednisone and requests this today.  pt is aware CDY not in the office this afternoon, but will forward to "doc of the day" for review.  please advise, thanks! Parke Poisson CNA  October 31, 2009 5:10 PM   Additional Follow-up for Phone Call Additional follow up Details #1::        ok to call in prednisone 20mg  tabs take 2 each day for 2 days, then 1 1/2 each day for 2 days, then one each day for 2 days, then 1/2 each day for 2 days,then stop QS, no fills. Additional Follow-up by: Kathee Delton MD,  October 31, 2009 5:33 PM    Additional Follow-up for Phone Call Additional follow up Details #2::    pt aware rx sent to pharmacy.  Jinny Blossom Reynolds LPN  February 23, 624THL 5:33 PM   New/Updated Medications: PREDNISONE 20 MG TABS (PREDNISONE) take 2 tabs x 2 days, then 1 1/2 tabs x 2 days, then 1 tab x 2 days, then 1/2 tab x 2 days then stop Prescriptions: PREDNISONE 20 MG TABS (PREDNISONE) take 2 tabs x 2 days, then 1 1/2 tabs x 2 days, then 1 tab x 2 days, then 1/2 tab x 2 days then stop  #10 x 0   Entered by:   Matthew Folks LPN   Authorized by:   Kathee Delton MD   Signed by:   Matthew Folks LPN on [id number]   Method used:   Electronically to        [facility] (retail)       [place] [address] [place], [place]  [address]       Ph: [phone]       Fax:  [phone]   RxID:   [id number]

## 2010-10-08 NOTE — Assessment & Plan Note (Signed)
Summary: xolair/apc  Nurse Visit   Allergies: 1)  ! Penicillin 2)  ! Doxycycline 3)  ! * Clindamycin 4)  ! Augmentin 5)  ! Biaxin  Medication Administration  Injection # 1:    Medication: Xolair (omalizumab) 150mg     Diagnosis: EXTRINSIC ASTHMA, UNSPECIFIED (ICD-493.00)    Route: SQ    Site: R deltoid    Exp Date: 11/08/2012    Lot #: [id number]    Mfr: GENENTECH    Comments: 1.2ML IN RIGHT ARM AND 1.2 ML IN LEFT ARM PT WAITED 20MINS    Given by: [facility]  Orders Added: 1)  Administration xolair injection [id number]   Medication Administration  Injection # 1:    Medication: Xolair (omalizumab) 150mg     Diagnosis: EXTRINSIC ASTHMA, UNSPECIFIED (ICD-493.00)    Route: SQ    Site: R deltoid    Exp Date: 11/08/2012    Lot #: [id number]    Mfr: GENENTECH    Comments: 1.2ML IN RIGHT ARM AND 1.2 ML IN LEFT ARM PT WAITED 20MINS    Given by: [facility]  Orders Added: 1)  Administration xolair injection [phone]

## 2010-10-08 NOTE — Assessment & Plan Note (Signed)
Summary: 3-4 wk f/u per checkout on 05/31/10/tg   Visit Type:  Follow-up Referring Provider:  Dr. [name] Primary Provider:  Dr. Sharilyn Sites   History of Present Illness: 77 year old woman presents for followup. She was seen back in September to establish followup of paroxysmal atrial fibrillation and mitral valve disease. Followup echocardiogram is reviewed below.  She reports feeling anxious, somewhat depressed. She states she has had problems with both of these issues in the past, previously was followed by a behavioral health specialist.  She does feel palpitations. Heart rate at yesterday's visit with Dr. Annamaria Boots was controlled although irregular. She remains in atrial fibrillation on examination today. So far she has tolerated Cardizem CD.  I reviewed her echocardiogram. Most likely she has an element of a nonischemic, tachycardia mediated myopathy in light of her atrial fibrillation. Indeed, cardiac catheterization done previously showed no significant CAD. She is not reporting any angina. Mitral regurgitation has not progressed substantially.  Current Medications (verified): 1)  Lipitor 10 Mg  Tabs (Atorvastatin Calcium) .... Take 1 Tablet By Mouth Once A Day 2)  Nexium 40 Mg  Cpdr (Esomeprazole Magnesium) .... Take 1 Capsule By Mouth Two Times A Day 3)  Caltrate 600+d 600-400 Mg-Unit  Tabs (Calcium Carbonate-Vitamin D) .... Take 1 Tablet By Mouth Once A Day 4)  Lasix 40 Mg  Tabs (Furosemide) .... As Needed 5)  Klor-Con M20 20 Meq  Tbcr (Potassium Chloride Crys Cr) .Marland Kitchen.. 1 With Each Lasix Dose 6)  Xolair 150 Mg  Solr (Omalizumab) .... 300mg  Every 4 Weeks 7)  Xopenex 1.25 Mg/20ml Nebu (Levalbuterol Hcl) .Marland Kitchen.. 1neb  Three Times A Day As Needed 8)  Proair Hfa 108 (90 Base) Mcg/act  Aers (Albuterol Sulfate) .Marland Kitchen.. 1-2 Puffs Every 4-6 Hours As Needed 9)  Twinject Epinephrine 10)  Nasonex 50 Mcg/act Susp (Mometasone Furoate) .... 2 Sprays Each Nostril Once Daily1 11)  Vitamin D 1000 Unit  Tabs (Cholecalciferol) .... Take 1 Tab Daily 12)  Cardizem Cd 180 Mg Xr24h-Cap (Diltiazem Hcl Coated Beads) .... Take 1 Tablet By Mouth Once Daily  Allergies (verified): 1)  ! Penicillin 2)  ! Doxycycline 3)  ! * Clindamycin 4)  ! Augmentin 5)  ! Biaxin  Comments:  Nurse/Medical Assistant: patient reviewed med from previous ov stated all meds are correct also stopped prednisone   Past History:  Past Medical History: Last updated: 05/31/2010 Asthma Atrial Fibrillation C O P D G E R D Mitral Valve Prolapse - moderate mitral regurgitation 2007 (cardiac catheterization, Dr. Jaci Standard) Nasal polyp Chronic rhinitis Obstructive sleep apnea  Social History: Last updated: 05/31/2010 Tobacco Use - Former (quit 30 years ago) Alcohol Use - no Single   Review of Systems       The patient complains of dyspnea on exertion.  The patient denies anorexia, fever, chest pain, syncope, peripheral edema, hemoptysis, melena, and hematochezia.         Otherwise reviewed and negative except as outlined.  Vital Signs:  Patient profile:   77 year old female Weight:      203 pounds BMI:     31.91 Pulse rate:   76 / minute BP sitting:   138 / 100  (right arm)  Vitals Entered By: Doretha Sou, CNA (June 21, 2010 1:58 PM)  Physical Exam  Additional Exam:  Overweight woman in no acute distress. HEENT: Conjunctiva and lids normal, oropharynx with moist mucosa. Neck: Supple, no elevated JVP or bruits. No thyromegaly. Lungs: Coarse, diminished breath sounds, with end  expiratory wheezes, nonlabored. Cardiac: Irregularly irregular, no S3. Abdomen: Soft, nontender, bowel sounds present. Skin: Warm and dry. Extremities: 1+ edema below the knees bilaterally. Distal pulses one plus.      Echocardiogram  Procedure date:  06/03/2010  Findings:      Study Conclusions            - Left ventricle: The cavity size was normal. There was mild       concentric hypertrophy. Systolic  function was mildly to moderately       reduced. The estimated ejection fraction was in the range of 40%       to 45%. Mild to moderate hypokinesis of the anteroseptal and       anterolateral myocardium, possibly the result of inadequate LV       filling due to AF.     - Mitral valve: Mild to moderate regurgitation.     - Left atrium: The atrium was moderately dilated.     - Right ventricle: The cavity size was normal. Wall thickness was       moderately increased. Systolic function was mildly to moderately       reduced.     - Right atrium: The atrium was mildly to moderately dilated.     - Atrial septum: No defect or patent foramen ovale was identified.     - Pericardium, extracardiac: There was a moderate-sized left pleural       effusion.  Impression & Recommendations:  Problem # 1:  ATRIAL FIBRILLATION (ICD-427.31)  Back in atrial fibrillation today on examination. She may be feeling some degree of agitation related to her arrhythmia, although certainly other features are contributing. She does report a prior history of anxiety and depression, and plans to be reevaluated for this. She also had recent medication adjustments by Dr. Annamaria Boots including discontinuation of albuterol in favor of Xopenex. We will increase Cardizem CD 280 mg daily. Continue on aspirin for now. The possibility of antiarrhythmic therapy may ultimately need to be considered, although I suspect that the likelihood of her maintaining sinus rhythm will be fairly low in light of chronic lung disease, mitral valve disease, and a moderately dilated left atrium. I will see her back over the next 2 months, sooner if needed.  Problem # 2:  MITRAL VALVE PROLAPSE (ICD-424.0)  Recent echocardiogram demonstrates mild to moderate mitral regurgitation, not progressive. Will continue to follow.  Her updated medication list for this problem includes:    Lasix 40 Mg Tabs (Furosemide) .Marland Kitchen... As needed  Problem # 3:  UNSPECIFIED  SECONDARY CARDIOMYOPATHY (ICD-425.9)  LVEF approximately 45% by recent assessment, suspect related to potential tachycardia mediated myopathy, particularly in light of her previous reassuring cardiac catheterization results. Plan to continue efforts at rate control, and ultimately reassess function.  Her updated medication list for this problem includes:    Lasix 40 Mg Tabs (Furosemide) .Marland Kitchen... As needed    Cardizem Cd 180 Mg Xr24h-cap (Diltiazem hcl coated beads) .Marland Kitchen... Take 1 tablet by mouth once daily  Other Orders: Administration xolair injection [phone])  Patient Instructions: 1)  Your physician has recommended you make the following change in your medication: Increase Cardizem to 180mg  by mouth once daily  2)  Your physician recommends that you schedule a follow-up appointment in: 2 months Prescriptions: CARDIZEM CD 180 MG XR24H-CAP (DILTIAZEM HCL COATED BEADS) take 1 tablet by mouth once daily  #30 x 3   Entered by:   Jeani Hawking Via LPN   Authorized by:  Beckie Salts, MD, [facility]   Signed by:   Jeani Hawking Via LPN on [id number]   Method used:   Electronically to        [facility] (retail)       [place] [address]       [place], [place]  [address]       Ph: [phone]       Fax: [phone]   RxID:   [id number]    Medication Administration  Injection # 1:    Medication: Xolair (omalizumab) 150mg     Diagnosis: EXTRINSIC ASTHMA, UNSPECIFIED (ICD-493.00)    Route: IM    Site: R deltoid    Exp Date: 06/08/2013    Lot #: [id number]    Mfr: Thedora Hinders    Comments: xolair 300 mg 60 units. 1.2 in Right Arm and 1.2 ml in left arm. pt waited 10 mins.    Given by: Alroy Bailiff, Allergy Tech  Orders Added: 1)  Administration xolair injection [phone]  Appended Document: 3-4 wk f/u per checkout on 05/31/10/tg     Allergies: 1)  ! Penicillin 2)  ! Doxycycline 3)  ! * Clindamycin 4)  ! Augmentin 5)  ! Biaxin    put xolair charge on wrong  account and I will correct and bill the correct account. Sorry. Marcy Siren [facility])  June 24, 2010 3:10 PM

## 2010-10-08 NOTE — Letter (Signed)
Summary: [facility]  [facility]   Imported By: Bubba Hales 01/11/2010 10:13:16  _____________________________________________________________________  External Attachment:    Type:   Image     Comment:   External Document

## 2010-10-08 NOTE — Assessment & Plan Note (Signed)
Summary: xolair/apc  Nurse Visit   Allergies: 1)  ! Penicillin 2)  ! Doxycycline 3)  ! * Clindamycin 4)  ! Augmentin 5)  ! Biaxin  Medication Administration  Injection # 1:    Medication: Xolair (omalizumab) 150mg     Diagnosis: EXTRINSIC ASTHMA, UNSPECIFIED (ICD-493.00)    Route: SQ    Site: R deltoid    Exp Date: 01/06/2013    Lot #: [id number]    Mfr: GENENTECH    Comments: 1.2 ML IN RIGHT AND LEFT ARM PT WAITED 20 MINS    Patient tolerated injection without complications    Given by: [facility]  Orders Added: 1)  Administration xolair injection [id number]   Medication Administration  Injection # 1:    Medication: Xolair (omalizumab) 150mg     Diagnosis: EXTRINSIC ASTHMA, UNSPECIFIED (ICD-493.00)    Route: SQ    Site: R deltoid    Exp Date: 01/06/2013    Lot #: [id number]    Mfr: GENENTECH    Comments: 1.2 ML IN RIGHT AND LEFT ARM PT WAITED 20 MINS    Patient tolerated injection without complications    Given by: [facility]  Orders Added: 1)  Administration xolair injection [phone]

## 2010-10-08 NOTE — Progress Notes (Signed)
Summary: rx's   Phone Note Call from Patient Call back at Home Phone [phone]   Caller: Patient Call For: YOUNG Summary of Call: [name], pt says you were going to fax rx's for prednisone and xopenex to [facility] in [place]. they haven't received this yet. (pt says  she did not receive any rx's).  Initial call taken by: Cooper Render, CNA,  June 21, 2010 10:37 AM  Follow-up for Phone Call        Spoke with [name] and she states that she has rxs and will take care of this.  I called the pt and informed her of this.  Follow-up by: Tilden Dome,  June 21, 2010 11:24 AM

## 2010-10-08 NOTE — Assessment & Plan Note (Signed)
Summary: rov 6 months///kp   Primary Provider/Referring Provider:  Sharilyn Sites  CC:  6 month follow up , pt c/o increase sob, and wheezing x 3 days.  History of Present Illness: July 31, 2009- Chronic asthma/ COPD, chronic rhinosinusitis By measures of less cough and fewer trips needed here, her Arvid Right is dong very well. She notes " allergy", meaning increased nasal congestion related to cooler weather and the leaves. Strill needs her neb as much as 3-4 times/ day. Had flu vax and has had Pneumovax at least twice.  November 29, 2009- Chronic asthma/ COPD, Chronic rhinosinusitis We called in a prednisone taper during an exacerbation 2-3 weeks ago she attributed to "allergy". Off prednisone for 2 weeks. She is at her baseline. Uses nebulizer about 3 x daily for chest tightness. cough is productive, clear. Denies chest pain, fever, ankle edema.  May 28, 2010- Chronic asthma/ COPD, Chronic rhinosinusitis Continues Xolair. We called in Septra to hold during a flare of sinus disease in May- says she thinks she took it then. Pretty good summer. 3 days of increased SOB , tightness and wheeze. Was outside for 2 days just before this. Doubts a cold. Last prednisone in February. Using rescue inhaler less than once/ week. With this acute iillness she is using her nebulizer three times a day.  Denies chest pain, palpitation, syncope, swelling, fever, blood or pain.   Asthma History    Initial Asthma Severity Rating:    Age range: 12+ years    Symptoms: daily    Nighttime Awakenings: 0-2/month    Interferes w/ normal activity: minor limitations    SABA use (not for EIB): several times per day    Asthma Severity Assessment: Severe Persistent   Preventive Screening-Counseling & Management  Alcohol-Tobacco     Smoking Status: quit > 6 months     Year Quit: 1980  Current Medications (verified): 1)  Lipitor 10 Mg  Tabs (Atorvastatin Calcium) .... Take 1 Tablet By Mouth Once A Day 2)   Nexium 40 Mg  Cpdr (Esomeprazole Magnesium) .... Take 1 Capsule By Mouth Two Times A Day 3)  Caltrate 600+d 600-400 Mg-Unit  Tabs (Calcium Carbonate-Vitamin D) .... Take 1 Tablet By Mouth Once A Day 4)  Lasix 40 Mg  Tabs (Furosemide) .... As Needed 5)  Klor-Con M20 20 Meq  Tbcr (Potassium Chloride Crys Cr) .Marland Kitchen.. 1 With Each Lasix Dose 6)  Xolair 150 Mg  Solr (Omalizumab) .... 300mg  Every 4 Weeks 7)  Albuterol Sulfate (2.5 Mg/31ml) 0.083%  Nebu (Albuterol Sulfate) .... As Needed 8)  Proair Hfa 108 (90 Base) Mcg/act  Aers (Albuterol Sulfate) .Marland Kitchen.. 1-2 Puffs Every 4-6 Hours As Needed 9)  Twinject Epinephrine 10)  Ester-C  Tabs (Bioflavonoid Products) .... Use As Directed Per Bottle 11)  Nasonex 50 Mcg/act Susp (Mometasone Furoate) .... 2 Sprays Each Nostril Once Daily1 12)  Prednisone 10 Mg Tabs (Prednisone) .... Take 1 Tablet By Mouth Once A Day Until 06-20-10 Appt With Dr. Annamaria Boots  Allergies (verified): 1)  ! Penicillin 2)  ! Doxycycline 3)  ! * Clindamycin 4)  ! Augmentin 5)  ! Biaxin  Past History:  Past Medical History: Last updated: 01/12/2008 HYPERSOMNIA WITH SLEEP APNEA UNSPECIFIED (ICD-780.53) COPD (ICD-496) ESOPHAGEAL REFLUX (ICD-530.81) MITRAL VALVE PROLAPSE (ICD-424.0) ASTHMATIC BRONCHITIS, ACUTE (ICD-466.0) NASAL POLYP (ICD-471.0) CHRONIC RHINITIS (ICD-472.0) EXTRINSIC ASTHMA, UNSPECIFIED (ICD-493.00)    Past Surgical History: Last updated: 123XX123 Nissen fundoplication 123XX123 Appendectomy 10 years ago Nasal surgery Bilateral cataract surgery  Family History: Last  updated: 09/15/2007  both parents died colon cancer Brother smoked - died of lung cancer Brother smoked- now has lung cancer  Social History: Last updated: 09/15/2007 Patient states former smoker quit 30 yrs ago  Risk Factors: Smoking Status: quit > 6 months (05/28/2010)  Social History: Smoking Status:  quit > 6 months  Review of Systems      See HPI       The patient complains of shortness  of breath with activity.  The patient denies shortness of breath at rest, productive cough, non-productive cough, coughing up blood, chest pain, irregular heartbeats, acid heartburn, indigestion, loss of appetite, weight change, abdominal pain, difficulty swallowing, sore throat, tooth/dental problems, headaches, nasal congestion/difficulty breathing through nose, sneezing, itching, ear ache, rash, change in color of mucus, and fever.    Vital Signs:  Patient profile:   77 year old female Height:      67 inches Weight:      206.2 pounds BMI:     32.41 O2 Sat:      95 % on Room air Pulse rate:   103 / minute BP sitting:   100 / 58  (left arm)  Vitals Entered By: Stefani Dama RCP, LPN (September 20, 624THL 9:56 AM)  O2 Flow:  Room air CC: 6 month follow up , pt c/o increase sob, wheezing x 3 days Comments Medications reviewed with patient Stefani Dama RCP, LPN  September 20, 624THL 9:58 AM    Physical Exam  Additional Exam:  General: A/Ox3; pleasant and cooperative, NAD, SKIN: no rash, lesions NODES: no lymphadenopathy HEENT: Plush/AT, EOM- WNL, Conjuctivae- clear,  ., PERRLA, TM-WNL, Nose-no visible polyps,  ., Throat- clear and wnl, Mallampatii II, dentures NECK: Supple w/ fair ROM, JVD- none, normal carotid impulses w/o bruits Thyroid-  CHEST:Unlabored wheeze R>L HEART: irregularlyirregular no m/g/r heard...............this is a change ABDOMEN: soft, nontender [id number], nl pulses, no edema  NEURO: Grossly intact to observation      Impression & Recommendations:  Problem # 1:  ATRIAL FIBRILLATION WITH RAPID VENTRICULAR RESPONSE (ICD-427.31) EKG confirms exam- AFib. New problem of AF w/ RVR. Had a cardiac cath at [facility] several years ago- told her MVP wasn't bad and that she didn't have siginficant disease. She has no ongoing f/u there. She asks we establish her with a cardiologist and she lives in [place]. Add aspirin. Pertinent initial labs to have available for  cardiology.  Denies chest pain, syncope. Has been feeling a little bit "nervous" with no hx of thyroid disese. We discussed her stimulant bronhodilators and will give a little prednisone so she can back off on the nebulizer this week. She is not wheezing now.   Problem # 2:  RHINOSINUSITIS, CHRONIC (ICD-473.8) Short exacerbation in May responded to Septra. Doing well now.  Problem # 3:  EXTRINSIC ASTHMA, UNSPECIFIED (ICD-493.00) As above. I will use low dose prednisone to stabilize her so she can minimize stimulant bronchodilators this week. She continues Xolair.  Problem # 4:  HYPERSOMNIA WITH SLEEP APNEA UNSPECIFIED (ICD-780.53) She wasn't able to stay off nebulizer enough in the past to permit realistic use of CPAP when we worked on this in the past. Her asthma control has been so much better that we will want to revisit this issue, respecting potential impact on her heart. I will bring her back after she has seen the cardiologist.  Medications Added to Medication List This Visit: 1)  Prednisone 10 Mg Tabs (Prednisone) .... Take 1 tablet by mouth once a day  until 06-20-10 appt with dr. Annamaria Boots  Other Orders: Est. Patient Level IV [phone]) Cardiology Referral (Cardiology) Flu Vaccine 69yrs + MEDICARE PATIENTS [phone]) Administration Flu vaccine - MCR (G0008) TLB-TSH (Thyroid Stimulating Hormone) (84443-TSH) TLB-BMP (Basic Metabolic Panel-BMET) (99991111) TLB-CBC Platelet - w/Differential (85025-CBCD) TLB-Cardiac Panel [id number])  Patient Instructions: 1)  Please schedule a follow-up appointment in 3 weeks. 2)  See [facility] to set up cardiology referral 3)  Take one adult aspirin daily till you see the cardiologist 4)  Prednisone 10 mg daily till you see me again. 5)  Don't use more of your nebulizer albuterol than you really need, to avoid speeding yoiur heart. 6)  Flu vax 7)  lab Prescriptions: PREDNISONE 10 MG TABS (PREDNISONE) Take 1 tablet by mouth once a day until  06-20-10 appt with Dr. Annamaria Boots  #15 x 0   Entered by:   Parke Poisson CNA/MA   Authorized by:   Deneise Lever MD   Signed by:   Parke Poisson CNA/MA on 05/28/2010   Method used:   Electronically to        [facility] (retail)       [place] [address]       [place], [place]  [address]       Ph: [phone]       Fax: [phone]   [id number]   [phone]     Flu Vaccine Consent Questions     Do you have a history of severe allergic reactions to this vaccine? no    Any prior history of allergic reactions to egg and/or gelatin? no    Do you have a sensitivity to the preservative Thimersol? no    Do you have a past history of Guillan-Barre Syndrome? no    Do you currently have an acute febrile illness? no    Have you ever had a severe reaction to latex? no    Vaccine information given and explained to patient? yes    Are you currently pregnant? no    Lot Number:[id number]   Exp Date:03/08/2011   Site Given  Left Deltoid IMmedflu  Parke Poisson CNA/MA  May 28, 2010 2:21 PM

## 2010-10-08 NOTE — Assessment & Plan Note (Signed)
Summary: xolair/apc  Nurse Visit   Allergies: 1)  ! Penicillin 2)  ! Doxycycline 3)  ! * Clindamycin 4)  ! Augmentin 5)  ! Biaxin  Medication Administration  Injection # 1:    Medication: Xolair (omalizumab) 150mg     Diagnosis: EXTRINSIC ASTHMA, UNSPECIFIED (ICD-493.00)    Route: IM    Site: R deltoid    Exp Date: 04/08/2013    Lot #: [id number]    Mfr: Thedora Hinders    Comments: Xolair 300mg  1.17ml x 1 in Right and Left Deltoid.  Pt waited 20 mins.    Patient tolerated injection without complications    Given by: Marcy Siren Deborra Medina) (April 16, 2010 10:59 AM)  Orders Added: 1)  Administration xolair injection [phone]   Medication Administration  Injection # 1:    Medication: Xolair (omalizumab) 150mg     Diagnosis: EXTRINSIC ASTHMA, UNSPECIFIED (ICD-493.00)    Route: IM    Site: R deltoid    Exp Date: 04/08/2013    Lot #: [id number]    Mfr: Genetech    Comments: Xolair 300mg  1.30ml x 1 in Right and Left Deltoid.  Pt waited 20 mins.    Patient tolerated injection without complications    Given by: Marcy Siren Deborra Medina) (April 16, 2010 10:59 AM)  Orders Added: 1)  Administration xolair injection [phone]

## 2010-10-08 NOTE — Assessment & Plan Note (Signed)
Summary: xolair///kp  Nurse Visit   Allergies: 1)  ! Penicillin 2)  ! Doxycycline 3)  ! * Clindamycin 4)  ! Augmentin 5)  ! Biaxin  Medication Administration  Injection # 1:    Medication: Xolair (omalizumab) 150mg     Diagnosis: EXTRINSIC ASTHMA, UNSPECIFIED (ICD-493.00)    Route: SQ    Site: R deltoid    Exp Date: 11/2012    Lot #: [id number]    Mfr: Celso Amy    Comments: Injection given by Marcy Siren in allergy lab. Xolair 300mg . 1.2 ml x 1 in Right and Left Deltoid. Pt waited 30 minutes.     Patient tolerated injection without complications  Orders Added: 1)  Admin of patients own med IM/SQ [id number]

## 2010-10-08 NOTE — Assessment & Plan Note (Signed)
Summary: xolair/mhh  Nurse Visit   Allergies: 1)  ! Penicillin 2)  ! Doxycycline 3)  ! * Clindamycin 4)  ! Augmentin 5)  ! Biaxin  Medication Administration  Injection # 1:    Medication: Xolair (omalizumab) 150mg     Diagnosis: EXTRINSIC ASTHMA, UNSPECIFIED (ICD-493.00)    Route: SQ    Site: L deltoid    Exp Date: 06/2013    Lot #: [id number]    Mfr: Genetech    Comments: 1.2 ML IN LEFT AND RIGHT ARM 300MG  PT DIDNT WAIT CHARGED [id number]     Given by: Alroy Bailiff IN ALLERGY LAB  Orders Added: 1)  Administration xolair injection [id number]   Medication Administration  Injection # 1:    Medication: Xolair (omalizumab) 150mg     Diagnosis: EXTRINSIC ASTHMA, UNSPECIFIED (ICD-493.00)    Route: SQ    Site: L deltoid    Exp Date: 06/2013    Lot #: [id number]    Mfr: Genetech    Comments: 1.2 ML IN LEFT AND RIGHT ARM 300MG  PT DIDNT WAIT CHARGED [id number]     Given by: Alroy Bailiff IN ALLERGY LAB  Orders Added: 1)  Administration xolair injection [id number]

## 2010-10-08 NOTE — Progress Notes (Signed)
Summary: xolair  Phone Note Call from Patient Call back at [facility] Phone [phone]   Caller: Patient Call For: Anaelle Dunton Summary of Call: pt waiting on prior auth for xolair. wants to speak to nurse as she has a Soil scientist weds.  Initial call taken by: Cooper Render,  September 10, 2009 9:39 AM  Follow-up for Phone Call        Rx was approved for one year and RX has been shipped to pt; pt brings RX with her to each appt to get shots. Spoke with pt; aware that rx was approved and states that insurance company told her that the rx would be shipped overnight and should have it in time for appt. Clayborne Dana CMA  September 11, 2009 1:16 PM

## 2010-10-08 NOTE — Assessment & Plan Note (Signed)
Summary: XOLIAR/CB  Nurse Visit   Allergies: 1)  ! Penicillin 2)  ! Doxycycline 3)  ! * Clindamycin 4)  ! Augmentin 5)  ! Biaxin  Medication Administration  Injection # 1:    Medication: Xolair (omalizumab) 150mg     Diagnosis: 493.00    Route: SQ    Site: R deltoid    Exp Date: 03/08/2013    Lot #: [id number]    Mfr: Thedora Hinders    Comments: 1.2 ML IN RIGHT AND LEFT ARM PT DIDNT WAIT CHARGED [id number]    Given by: Alroy Bailiff IN ALLERGY LAB   Medication Administration  Injection # 1:    Medication: Xolair (omalizumab) 150mg     Diagnosis: 493.00    Route: SQ    Site: R deltoid    Exp Date: 03/08/2013    Lot #: [id number]    Mfr: Thedora Hinders    Comments: 1.2 ML IN RIGHT AND LEFT ARM PT DIDNT WAIT CHARGED [id number]    Given by: TAMMY SCOTT IN ALLERGY LAB

## 2010-10-08 NOTE — Progress Notes (Signed)
Summary: Phone- Reviewed thoracentesis c/w transudative effusion  Phone Note Call from Patient Call back at Home Phone [phone]   Caller: Patient Call For: young Summary of Call: pt has been waiting to hear results of cytopathology report (from 11/4) Initial call taken by: Cooper Render, CNA,  August 02, 2010 1:33 PM  Follow-up for Phone Call        path report from 11.4.11 signed in EMR.  CDY please advise of results so we may call pt. Parke Poisson CNA/MA  August 02, 2010 1:42 PM   Additional Follow-up for Phone Call Additional follow up Details #1::        I reviewed cytopath results from thoracentesis- benign inflammatory with no WBC or organisms. She complains of increasing edema in her legs and asked if it could all be from her cardizem. She has a f/u cardiology appointment pending with Dfr mcDowell. I think this is cardiogenic fluid retention with transudative effusion.  Additional Follow-up by: Deneise Lever MD,  August 02, 2010 2:34 PM

## 2010-10-08 NOTE — Progress Notes (Signed)
Summary: request to see dr young today or tomorrow  Phone Note Call from Patient Call back at Home Phone [phone]   Caller: Patient Call For: young Summary of Call: pt requests to see dr young today (was offered the opening at 6 but she wouldn't be able to get here at that time). pt c/o SOB/ feet and legs are swollen.  Initial call taken by: Cooper Render, CNA,  July 10, 2010 9:03 AM  Follow-up for Phone Call        Pt c/o increased SOB and swelling in legs and request an appt with CY today. Pt set to see him thispm at 3:30.Godwin Bing CMA  July 10, 2010 10:20 AM

## 2010-10-08 NOTE — Progress Notes (Signed)
Summary: shipment  Phone Note From Pharmacy Call back at [phone]   Caller: Patient Caller: carmark natalie Call For: Arshdeep Bolger  Summary of Call: calling to see if xolair shipment is needed Initial call taken by: Gustavus Bryant,  December 06, 2009 12:01 PM  Follow-up for Phone Call        I called the phar. back. They're going to call  Mrs.Cong & talk to her because they usually send it to her home. Follow-up by: Alroy Bailiff,  December 06, 2009 2:57 PM

## 2010-10-08 NOTE — Assessment & Plan Note (Signed)
Summary: xolair/apc  Nurse Visit   Allergies: 1)  ! Penicillin 2)  ! Doxycycline 3)  ! * Clindamycin 4)  ! Augmentin 5)  ! Biaxin  Medication Administration  Injection # 1:    Medication: Xolair (omalizumab) 150mg     Diagnosis: EXTRINSIC ASTHMA, UNSPECIFIED (ICD-493.00)    Route: SQ    Site: R deltoid    Exp Date: 11/2012    Lot #: [id number]    Mfr: Celso Amy    Comments: Injection given by Brock Bad, CMA in allergy lab. Xolair 300mg . 1.56ml x 1 in Right and Left Deltoid. Pt waited 30 minutes.    Patient tolerated injection without complications  Orders Added: 1)  Admin of patients own med IM/SQ [id number]

## 2010-10-08 NOTE — Assessment & Plan Note (Signed)
Summary: xolair/apc  Nurse Visit   Allergies: 1)  ! Penicillin 2)  ! Doxycycline 3)  ! * Clindamycin 4)  ! Augmentin 5)  ! Biaxin  Medication Administration  Injection # 1:    Medication: Xolair (omalizumab) 150mg     Diagnosis: EXTRINSIC ASTHMA, UNSPECIFIED (ICD-493.00)    Route: SQ    Site: R deltoid    Exp Date: 11/2012    Lot #: [id number]    Mfr: Celso Amy    Comments: Injection given by Marcy Siren in allergy lab. Xolair 300mg . 1.29mlo x 1 in Right and Left Deltoid. Pt waited 30 minutes.     Patient tolerated injection without complications  Orders Added: 1)  Admin of patients own med IM/SQ [id number]

## 2010-10-08 NOTE — Assessment & Plan Note (Signed)
Summary: per Dr.Young for [name]   Visit Type:  Initial Consult Referring Provider:  Dr. [name] Primary Provider:  Dr. Sharilyn Sites   History of Present Illness: 77 year old woman referred by Dr. Annamaria Boots for cardiology consultation. Records indicate that she was recently diagnosed with atrial fibrillation at an office visit with Dr. Annamaria Boots on 20 September. Review of electrocardiogram from that visit shows probable coarse atrial fibrillation at 161 beats per minute, cannot entirely exclude an ectopic atrial tachycardia with variable conduction, low voltage, nonspecific ST changes. Ms. Maiorano states that she did not specifically have palpitations at the time, although felt somewhat "nervous" while she was coming to her visit, and could not figure out why. She states in retrospect that she has felt similar "nervousness" in the past.  I note a history of mitral valve prolapse and mitral regurgitation, detailed below. She has not had followup with her prior cardiology practice since 2007. No followup echocardiography.  Patient denies any long-standing history of hypertension, type 2 diabetes mellitus, thyroid disease, congestive heart failure, or cerebrovascular disease.  Labs from 21 September showed potassium 3.8, BUN 18, creatinine 1.0, hemoglobin 11.7, platelets 160, CK 50, CK-MB 1.3. Her followup electrocardiogram today shows sinus rhythm.   Preventive Screening-Counseling & Management  Alcohol-Tobacco     Smoking Status: quit  Current Medications (verified): 1)  Lipitor 10 Mg  Tabs (Atorvastatin Calcium) .... Take 1 Tablet By Mouth Once A Day 2)  Nexium 40 Mg  Cpdr (Esomeprazole Magnesium) .... Take 1 Capsule By Mouth Two Times A Day 3)  Caltrate 600+d 600-400 Mg-Unit  Tabs (Calcium Carbonate-Vitamin D) .... Take 1 Tablet By Mouth Once A Day 4)  Lasix 40 Mg  Tabs (Furosemide) .... As Needed 5)  Klor-Con M20 20 Meq  Tbcr (Potassium Chloride Crys Cr) .Marland Kitchen.. 1 With Each Lasix Dose 6)   Xolair 150 Mg  Solr (Omalizumab) .... 300mg  Every 4 Weeks 7)  Albuterol Sulfate (2.5 Mg/65ml) 0.083%  Nebu (Albuterol Sulfate) .... As Needed 8)  Proair Hfa 108 (90 Base) Mcg/act  Aers (Albuterol Sulfate) .Marland Kitchen.. 1-2 Puffs Every 4-6 Hours As Needed 9)  Twinject Epinephrine 10)  Nasonex 50 Mcg/act Susp (Mometasone Furoate) .... 2 Sprays Each Nostril Once Daily1 11)  Prednisone 10 Mg Tabs (Prednisone) .... Take 1 Tablet By Mouth Once A Day Until 06-20-10 Appt With Dr. Annamaria Boots 12)  Vitamin D 1000 Unit Tabs (Cholecalciferol) .... Take 1 Tab Daily 13)  Cardizem Cd 120 Mg Xr24h-Cap (Diltiazem Hcl Coated Beads) .... Take 1 Tablet By Mouth Once Daily  Allergies (verified): 1)  ! Penicillin 2)  ! Doxycycline 3)  ! * Clindamycin 4)  ! Augmentin 5)  ! Biaxin  Past History:  Family History: Last updated: 06/18/10 Both parents died colon cancer Brother smoked - died of lung cancer Brother smoked - now has lung cancer  Social History: Last updated: 06/18/2010 Tobacco Use - Former (quit 30 years ago) Alcohol Use - no Single   Past Medical History: Asthma Atrial Fibrillation C O P D G E R D Mitral Valve Prolapse - moderate mitral regurgitation 2007 (cardiac catheterization, Dr. Jaci Standard) Nasal polyp Chronic rhinitis Obstructive sleep apnea  Past Surgical History: Laparoscopic Nissen fundoplication 123XX123 Appendectomy 10 years ago Nasal surgery Bilateral cataract surgery  Family History: Both parents died colon cancer Brother smoked - died of lung cancer Brother smoked - now has lung cancer  Social History: Tobacco Use - Former (quit 30 years ago) Alcohol Use - no Single  Smoking Status:  quit  Review of Systems       The patient complains of dyspnea on exertion and peripheral edema.  The patient denies anorexia, fever, chest pain, syncope, prolonged cough, hemoptysis, melena, hematochezia, and severe indigestion/heartburn.         She reports "chest congestion" being a chronic  problem, no progressive cough. She has had increased lower extremity edema of late. Otherwise reviewed and negative except as outlined.  Vital Signs:  Patient profile:   77 year old female Weight:      207 pounds BMI:     32.54 Pulse rate:   92 / minute BP sitting:   119 / 74  (right arm)  Vitals Entered By: Doretha Sou, CNA (May 31, 2010 1:55 PM)  Physical Exam  Additional Exam:  Overweight woman in no acute distress. HEENT: Conjunctiva and lids normal, oropharynx with moist mucosa. Neck: Supple, no elevated JVP or bruits. No thyromegaly. Lungs: Coarse, diminished breath sounds, with end expiratory wheezes, nonlabored. Cardiac: Regular rate and rhythm, possible midsystolic click appreciated while standing although not prominent, no loud systolic murmur however, no S3. Abdomen: Soft, nontender, bowel sounds present. Skin: Warm and dry. Musculoskeletal: No kyphosis. Extremities: 1+ edema below the knees bilaterally. Distal pulses one plus. Neuropsychiatric: Alert and oriented x3, affect appropriate.      Cardiac Cath  Procedure date:  12/04/2005  Findings:      FINDINGS:  The aortic pressure was 126/69, LV pressure was 130/15 with an  EDP of 18.  The right atrial mean pressure was 12, RV pressure was 40/9.  PA  pressure was 40/21 with a mean of 29. Coronary wedge pressure mean was 17.  Fick cardiac output was an inaccurate.  Her thermodilution cardiac output  was 6.2.   FINDINGS:  Left heart catheterization:  The left main coronary artery  trifurcates into the left anterior descending, ramus and circumflex vessel.  There was no disease noted in the left main coronary artery.  Left anterior  descending:  Left anterior descending was a large-caliber vessel. It gives  rise to a large septal perforator which provides anterolateral circulation.  It goes on to end as an apical branch.  There is no disease noted in the  left anterior descending.  The ramus vessel:  The  ramus vessel is large-  caliber vessel.  It provides a significant portion of the lateral  myocardium.  Circumflex vessel:  Circumflex vessel covers the distribution  of the right coronary artery providing distribution to the inferior,  posterior and lateral ventricles.  There was no disease noted in the  circumflex.  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   Single-plane ventriculogram revealed hypokinesis of the mid and  anterolateral portion.  There was otherwise normal wall motion.  Ejection  fraction was 65%.  There was moderate mitral regurgitation noted with  moderate left atrial enlargement noted.  Echocardiogram  Procedure date:  06/03/2002  Findings:      LEFT VENTRICLE:   -  Left ventricular size was normal.   -  Overall left ventricular systolic function was normal.   -  Left ventricular ejection fraction was estimated , range being 55         % to 65 %.   -  Left ventricular wall thickness was normal.    AORTIC VALVE:   -  The aortic valve was trileaflet.   -  Aortic valve thickness was normal.  Doppler interpretation(s):   -  There was no significant aortic valvular regurgitation.    MITRAL VALVE:   -  Very difficult acoustic windows. MV appears mildly thickened and         there appears to be moderate prolapse of the anterior mitral         leaflet with at least mild MR.    LEFT ATRIUM:   -  The left atrium was mildly to moderately dilated.  EKG  Procedure date:  05/31/2010  Findings:      Sinus rhythm at 91 beats per minute with low voltage, nonspecific T wave changes.  Impression & Recommendations:  Problem # 1:  ATRIAL FIBRILLATION (ICD-427.31)  Most likely paroxysmal based on symptom description of occasional "nervousness" without obvious etiology, correlated with documentation of atrial fibrillation by Dr. Annamaria Boots. Patient back in sinus rhythm now. CHADS2 score is essentially one at this point. History of  mitral valve prolapse with prior documentation of moderate mitral regurgitation, may well be related additionally. We discussed the matter today, and at this point I would continue aspirin, and add Cardizem CD 120 mg p.o. q.d. This will hopefully provide better rate control when it recurs. Followup 2-D echocardiogram is needed to reassess valvular status and LVEF. This will be arranged. Office followup for review.  Problem # 2:  MITRAL VALVE PROLAPSE (ICD-424.0)  History of mitral valve prolapse associated with moderate mitral regurgitation as of 2007. She has had no followup for this since that time. Plan 2-D echocardiogram to reassess.  Her updated medication list for this problem includes:    Lasix 40 Mg Tabs (Furosemide) .Marland Kitchen... As needed  Orders: 2-D Echocardiogram (2D Echo)  Patient Instructions: 1)  Your physician recommends that you schedule a follow-up appointment in: 3 to 4 weeks 2)  Your physician has recommended you make the following change in your medication: Start taking Cardizem CD 120mg  by mouth once daily  3)  Your physician has requested that you have an echocardiogram.  Echocardiography is a painless test that uses sound waves to create images of your heart. It provides your doctor with information about the size and shape of your heart and how well your heart's chambers and valves are working.  This procedure takes approximately one hour. There are no restrictions for this procedure. Prescriptions: CARDIZEM CD 120 MG XR24H-CAP (DILTIAZEM HCL COATED BEADS) take 1 tablet by mouth once daily  #30 x 3   Entered by:   Jeani Hawking Via LPN   Authorized by:   Beckie Salts, MD, [facility]   Signed by:   Jeani Hawking Via LPN on [id number]   Method used:   Electronically to        [facility] (retail)       [facility] [address] [place], [place]  [address]       Ph: [phone]       Fax: [phone]   RxID:   [id number]

## 2010-10-09 ENCOUNTER — Encounter: Payer: Self-pay | Admitting: Internal Medicine

## 2010-10-09 ENCOUNTER — Ambulatory Visit (INDEPENDENT_AMBULATORY_CARE_PROVIDER_SITE_OTHER): Payer: Medicare Other | Admitting: Internal Medicine

## 2010-10-09 DIAGNOSIS — R0602 Shortness of breath: Secondary | ICD-10-CM

## 2010-10-09 DIAGNOSIS — D649 Anemia, unspecified: Secondary | ICD-10-CM

## 2010-10-09 DIAGNOSIS — J310 Chronic rhinitis: Secondary | ICD-10-CM

## 2010-10-09 DIAGNOSIS — J45909 Unspecified asthma, uncomplicated: Secondary | ICD-10-CM

## 2010-10-10 ENCOUNTER — Encounter: Payer: Self-pay | Admitting: Cardiology

## 2010-10-10 ENCOUNTER — Ambulatory Visit: Payer: Self-pay | Admitting: Internal Medicine

## 2010-10-10 ENCOUNTER — Encounter (INDEPENDENT_AMBULATORY_CARE_PROVIDER_SITE_OTHER): Payer: Medicare Other | Admitting: Adult Health

## 2010-10-10 ENCOUNTER — Encounter: Payer: Self-pay | Admitting: Adult Health

## 2010-10-10 ENCOUNTER — Encounter (INDEPENDENT_AMBULATORY_CARE_PROVIDER_SITE_OTHER): Payer: Medicare Other

## 2010-10-10 DIAGNOSIS — Z7901 Long term (current) use of anticoagulants: Secondary | ICD-10-CM

## 2010-10-10 DIAGNOSIS — I4891 Unspecified atrial fibrillation: Secondary | ICD-10-CM

## 2010-10-10 DIAGNOSIS — E781 Pure hyperglyceridemia: Secondary | ICD-10-CM

## 2010-10-10 DIAGNOSIS — I5022 Chronic systolic (congestive) heart failure: Secondary | ICD-10-CM | POA: Insufficient documentation

## 2010-10-10 LAB — CONVERTED CEMR LAB: POC INR: 1.7

## 2010-10-10 NOTE — Progress Notes (Signed)
Summary: xolair form  Phone Note From Pharmacy   Caller: [facility] Call For: Malu Pellegrini  Summary of Call: caller says she needs a verbal answer re: form for xolair #5 question: severity of asthma. call [phone] Initial call taken by: Cooper Render, CNA,  September 17, 2010 5:01 PM  Follow-up for Phone Call        Spoke with Mardene Celeste at [facility].  She states that CDY did not answer question 5 on the xolair form, which is how severe is asthma.  It can be moderate-persistant, or severe persistant.  She states that med can not be approved until we have answer. Pls advise thanks Follow-up by: Tilden Dome,  September 17, 2010 5:19 PM  Additional Follow-up for Phone Call Additional follow up Details #1::        Her asthma is moderate persistent. Intermittently severe.  Additional Follow-up by: Deneise Lever MD,  September 17, 2010 5:49 PM    Additional Follow-up for Phone Call Additional follow up Details #2::    Mardene Celeste at [facility] was advised of answer to #5. [facility] CMA  September 18, 2010 9:40 AM    Appended Document: Arvid Right form Xolair has been approved from 09/18/10 to 09/19/2011. Pt has been notified. Document send down to be scanned.  Appended Document: xolair form Called and spoke with Elberta Fortis on Monday 09/23/10 and stated that Xolair has been approved for a year. Approved on 09/18/10.

## 2010-10-10 NOTE — Progress Notes (Signed)
Summary: prescription faxed authorization  Phone Note From Pharmacy   Caller: kevin with [facility] Call For: Dr Annamaria Boots  Summary of Call: [name] was calling to confirm that we received the fax for Xolair that was sent on Friday. [name] [phone] anyone at this number can assist you.  Initial call taken by: Ozella Rocks,  September 17, 2010 11:22 AM  Follow-up for Phone Call        Katie, have you or CDY seen this? If not I can call and have him re-fax.  Pls advise thanks! Follow-up by: Tilden Dome,  September 17, 2010 11:29 AM

## 2010-10-10 NOTE — Miscellaneous (Signed)
Summary: Orders Update  Clinical Lists Changes  Orders: Added new Service order of Administration xolair injection [phone]) - Signed      Medication Administration  Injection # 1:    Medication: Xolair (omalizumab) 150mg     Diagnosis: EXTRINSIC ASTHMA, UNSPECIFIED (ICD-493.00)    Route: SQ    Site: L deltoid    Exp Date: 06/2013    Lot #: [id number]    Mfr: [employer]    Comments: 1.2 ML IN RIGHT AND LEFT ARM 300MG  CHARGED 96401    Given by: Alroy Bailiff IN [facility]  Orders Added: 1)  Administration xolair injection [id number]

## 2010-10-10 NOTE — Medication Information (Signed)
Summary: Xolair approved/[facility]  Xolair approved/[facility]   Imported By: Phillis Knack 09/24/2010 11:49:06  _____________________________________________________________________  External Attachment:    Type:   Image     Comment:   External Document

## 2010-10-10 NOTE — Medication Information (Signed)
Summary: Prior Authorization for Xolair / [facility]  Prior Authorization for Xolair / [facility]   Imported By: Rise Patience 09/24/2010 11:48:35  _____________________________________________________________________  External Attachment:    Type:   Image     Comment:   External Document

## 2010-10-10 NOTE — Procedures (Signed)
Summary: Colonoscopy  Patient: [name] Note: All result statuses are Final unless otherwise noted.  Tests: (1) Colonoscopy (COL)   COL Colonoscopy           [facility]     [place], [place]  [address]           COLONOSCOPY PROCEDURE REPORT           PATIENT:  [name], [name]  MR#:  [id number]     BIRTHDATE:  [birth year], 76 yrs. old  GENDER:  female     ENDOSCOPIST:  Milus Banister, MD     REF. BY:  Loretha Brasil. Lia Foyer, M.D.     PROCEDURE DATE:  10/02/2010     PROCEDURE:  Diagnostic Colonoscopy     ASA CLASS:  Class II     INDICATIONS:  family history of colon cancer (both parents),     anemia     MEDICATIONS:   Fentanyl 50 mcg IV, Versed 5 mg IV           DESCRIPTION OF PROCEDURE:   After the risks benefits and     alternatives of the procedure were thoroughly explained, informed     consent was obtained.  Digital rectal exam was performed and     revealed no rectal masses.   The [id number]) endoscope was     introduced through the anus and advanced to the cecum, which was     identified by both the appendix and ileocecal valve, without     limitations.  The quality of the prep was adequate, using MiraLax.     The instrument was then slowly withdrawn as the colon was fully     examined.     <<PROCEDUREIMAGES>>     FINDINGS:  Mild diverticulosis was found in the sigmoid to     descending colon segments (see image001).  Internal and external     hemorrhoids were found.  This was otherwise a normal examination     of the colon (see image002, image003, and image004).   Retroflexed     views in the rectum revealed no abnormalities.    The scope was     then withdrawn from the patient and the procedure completed.     COMPLICATIONS:  None           ENDOSCOPIC IMPRESSION:     1) Mild diverticulosis in the sigmoid to descending colon     segments     2) Internal and external hemorrhoids     3) Otherwise normal  examination; no polyps or cancers           RECOMMENDATIONS:     1) Given your significant family history of colon cancer, you     should have a repeat colonoscopy in 5 years           REPEAT EXAM:  5 years           ______________________________     Milus Banister, MD           n.     eSIGNED:   Milus Banister at 10/02/2010 10:43 AM           Toribio Harbour, [id number]  Note: An exclamation mark (!) indicates a result that was not dispersed into the flowsheet. Document Creation Date: 10/02/2010 10:43 AM _______________________________________________________________________  (1) Order result status: Final Collection or observation date-time:  10/02/2010 10:38 Requested date-time:  Receipt date-time:  Reported date-time:  Referring Physician:   Ordering Physician: Owens Loffler [phone]) Specimen Source:  Source: [name] Order Number: [id number] Lab site:   Appended Document: Colonoscopy she needs recall colonoscopy in 5 years  Appended Document: Colonoscopy will put in EPIC when available  Appended Document: Colonoscopy recall added to [facility]

## 2010-10-10 NOTE — Letter (Signed)
Summary: [place] [facility] [facility] Work Doctor, general practice at [place]. [address] [place] [address]   Phone: [phone]  Fax: [phone]     August 23, 2010 MRN: [id number]   [name] [address] [place], [place]  [address]      YOUR LAB WORK IS DUE   September 27, 2010  Please go to [facility], located across the street from [facility] on the second floor.  Hours are Monday - Friday 7am until 7:30pm         Saturday 8am until 12noon    _X_  DO NOT EAT OR DRINK AFTER MIDNIGHT EVENING PRIOR TO LABWORK

## 2010-10-10 NOTE — Assessment & Plan Note (Signed)
Summary: ROV   Visit Type:  Follow-up Referring Provider:  Dr. [name] Primary Provider:  Dr. Sharilyn Sites   History of Present Illness: [name] presents today for ongoing assessment and treatment of Atrial fib  (not on coumadin), ongoing dyspnea, with history of asthma.  She states that she has panic attacks when she gets short of breath which makes this worse.  She sees Dr. Annamaria Boots, pulmonolgist for asthma issues.  On last visit, Dr. 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  A BMET was ordered.  If she remained symptomatic a discussion for repeat cardiac catherization was planned.  Today she remains dyspnec and states she feels worse than she did when she saw Dr. Zorita Pang on 08/23/2010.  She feels weaker and more tired.  She admitted that she did not always take the extra lasix if she had plans for that day.  She denies chest pain, dizziness, or syncope.  She is anxious and "just want to feel better!"  Reivew of labs drawn on last visit:(08/21/2010) BUN 34;Creat 1.3; Hgb 11.9, Hct, 39f.4; BNP 679.6  CXR dated 07/12/2010: interval decrease in a right plerual effusion without evidence for pnuemothorax following thorocentesis. Small residual right pleural effusion and atelectasis, stable left pleural effusion and atelectasis.  Stable cardiomyopathy.  Current Medications (verified): 1)  Lipitor 10 Mg  Tabs (Atorvastatin Calcium) .... Take 1 Tablet By Mouth Once A Day 2)  Nexium 40 Mg  Cpdr (Esomeprazole Magnesium) .... Take 1 Capsule By Mouth Two Times A Day 3)  Caltrate 600+d 600-400 Mg-Unit  Tabs (Calcium Carbonate-Vitamin D) .... Take 1 Tablet By Mouth Once A Day 4)  Lasix 40 Mg  Tabs (Furosemide) .... Take 1 Tablet By Mouth Once Daily 5)  Klor-Con M20 20 Meq  Tbcr (Potassium Chloride Crys Cr) .... Take 1 Tablet By Mouth Once A Day 6)  Xolair 150 Mg  Solr (Omalizumab)  .... 300mg  Every 4 Weeks 7)  Proair Hfa 108 (90 Base) Mcg/act  Aers (Albuterol Sulfate) .Marland Kitchen.. 1-2 Puffs Every 4-6 Hours As Needed 8)  Twinject Epinephrine 9)  Nasonex 50 Mcg/act Susp (Mometasone Furoate) .... 2 Sprays Each Nostril Once Daily1 10)  Vitamin D 1000 Unit Tabs (Cholecalciferol) .... Take 1 Tab Daily 11)  Cardizem Cd 240 Mg Xr24h-Cap (Diltiazem Hcl Coated Beads) .... Take 1 Tablet By Mouth Once A Day  Allergies (verified): 1)  ! Penicillin 2)  ! Doxycycline 3)  ! * Clindamycin 4)  ! Augmentin 5)  ! Biaxin  Comments:  Nurse/Medical Assistant: patient stated that all meds are the same from last ov [name] [facility]  Past History:  Past medical, surgical, family and social histories (including risk factors) reviewed, and no changes noted (except as noted below).  Past Medical History: Reviewed history from 05/31/2010 and no changes required. Asthma Atrial Fibrillation C O P D G E R D Mitral Valve Prolapse - moderate mitral regurgitation 2007 (cardiac catheterization, Dr. Jaci Standard) Nasal polyp Chronic rhinitis Obstructive sleep apnea  Past Surgical History: Reviewed history from 05/31/2010 and no changes required. Laparoscopic Nissen fundoplication [id number] Appendectomy 10 years ago Nasal surgery Bilateral cataract surgery  Family History: Reviewed history from 06/20/2010 and no changes required. Both parents died colon cancer Brother smoked - died of lung cancer Brother smoked - now has lung cancer Sister- Has AFib/ defibrillator  Social History: Reviewed history from 05/31/2010  and no changes required. Tobacco Use - Former (quit 30 years ago) Alcohol Use - no Single   Review of Systems       DOE, weakness  Vital Signs:  Patient profile:   77 year old female Weight:      188 pounds O2 Sat:      94 % on Room air Pulse rate:   105 / minute BP sitting:   104 / 76  (left arm)  Vitals Entered By: Doretha Sou, CNA (September 16, 2010 1:52 PM)  O2 Flow:  Room air  Physical Exam  General:  normal appearance.   Head:  normocephalic and atraumatic Eyes:  PERRLA/EOM intact; conjunctiva and lids normal. Lungs:  Diminshed bibasilar with some expiratory rhonchi, decreased "E" sign in the bases. Heart:  Non-displaced PMI, chest non-tender; regular rate and rhythm, S1, S2 without murmurs, rubs or gallops. Tachycardic.Carotid upstroke normal, no bruit. Normal abdominal aortic size, no bruits. Femorals normal pulses, no bruits. Pedals normal pulses. No edema, no varicosities. Abdomen:  Bowel sounds positive; abdomen soft and non-tender without masses, organomegaly, or hernias noted. No hepatosplenomegaly. Msk:  Back normal, normal gait. Muscle strength and tone normal. Pulses:  pulses normal in all 4 extremities Extremities:  No clubbing or cyanosis. Neurologic:  Alert and oriented x 3. Psych:  anxious.     Impression & Recommendations:  Problem # 1:  ATRIAL FIBRILLATION WITH RAPID VENTRICULAR RESPONSE (ICD-427.31) Increased dose of cardiazem did not assit in HR control.  BP has also become lower. This may be from over diuresis.  Will decrease lasix to 40mg  daily only.  If HR does not come down, may need to consider digoxin for addtional HR stablitly.  She is not on coumadin at present and this will be readdressed on next visit.  Problem # 2:  COPD ([id number]) She is followed by Dr. Annamaria Boots concerning this. At last visit with Dr. Domenic Polite, she was being considered for repeat cardiac catherization if her symptoms persisted,.  Cath in 2007 indicated no disease in LAD system or RCA.  EF at that time was 65%.  I will plan for her to have cardiac cath per Dr. Myles Gip last note.  If no obstructive CAD or elevated pressures will plan on better HR control and further pulmonary treatment. This will be discussed on next visit with Dr. Domenic Polite. Her updated medication list for this problem includes:    Xolair 150 Mg Solr  (Omalizumab) ..... 300mg  every 4 weeks    Proair Hfa 108 (90 Base) Mcg/act Aers (Albuterol sulfate) .Marland Kitchen... 1-2 puffs every 4-6 hours as needed  Problem # 3:  Hx of ANXIETY (ICD-300.00) Breathing status influences her anxiety.  Slow deep breathing is recommended.  Other Orders: T-Basic Metabolic Panel ([id number]) T-CBC w/Diff [phone]) T-Protime, Auto [phone]) T-PTT [phone]) Cardiac Catheterization (Cardiac Cath)  Patient Instructions: 1)  Your physician recommends that you schedule a follow-up appointment in: after cath 2)  Your physician recommends that you return for lab work in: today 3)  Your physician has recommended you make the following change in your medication: decrease Lasix to 40mg  by mouth once daily  4)  Your physician has requested that you have a cardiac catheterization.  Cardiac catheterization is used to diagnose and/or treat various heart conditions. Doctors may recommend this procedure for a number of different reasons. The most common reason is to evaluate chest pain. Chest pain can be a symptom of coronary artery disease (CAD), and cardiac catheterization can show whether plaque  is narrowing or blocking your heart's arteries. This procedure is also used to evaluate the valves, as well as measure the blood flow and oxygen levels in different parts of your heart.  For further information please visit [email or web address].  Please follow instruction sheet, as given.

## 2010-10-10 NOTE — Assessment & Plan Note (Addendum)
Summary: CONSULT REQUESTED BY DDR Lia Foyer - INPATIENT CONSULT   Visit Type:  INPATIENT PULMNARY CONSULTATION FOR DYSPNEA Copy to:  Dr Bing Quarry Primary Provider/Referring Provider:  Dr. Sharilyn Sites - pmd,. Dr Glynn Octave - Pulmonary, Dr. 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 Currently presented 1/15./2012 for elective cath in background of A FIB RVR but had severe orthopne and admitted. PCCM being cnsulted to sort out pulmonary versus cardiac cause of dyspnea  History dates back to Sept 2011 when diagnosed with A FIB RVR. Symptoms at onset were dyspnea, orthopnea (been sleeping at home recliner since oct 2011), and feeling of nervousness. Workup releavled ef 45% with focal anteror wall motrion abnormalities. Rate control and lasix strategy adopted (cath nromal in 2007) but has failed to correct despite steady increases in cardizem even as of mid-dec 2011. In addition, her CHF appeared resistant to correcion of increasing  lasix doses. On 07/12/2010 underwent Rt thoracentesis for 1.4L transudate (new pleural effusion). BNP in mid-dec 2011 was 680 and early jan was 528. Her right pleural effusion  have recurred and cxr currently is worse.   Since sept 2011 she has seen Dr Annamaria Boots several times and has tried prednisone taper 1-2 times without relief although I note she is not on any LABA/ICS or anticholinergic for COPD but it appars she did not tolerate dulera at some ponint in 2011.  Overall she states she has had progressive dyspnea since sept/oct 2011 and ties it in with A Fib diagnosis. Thisis assopciated with significant orthopnea. Been sleeping in recliner since then although I personally witnessed her lying in flat in bed today for over 5 minutes. She denies associatede symptoms of cold,  sputum, fever, chills, wheeze, edema, chest pain, hemoptysis, syncope. She admits to weight loss however.      Preventive Screening-Counseling & Management  Alcohol-Tobacco     Smoking Status: quit     Year Quit: 1980     Pack years: 10-15  Allergies: 1)  ! Penicillin 2)  ! Doxycycline 3)  ! * Clindamycin 4)  ! Augmentin 5)  ! Biaxin  Past History:  Past Medical History: Last updated: 05/31/2010 Asthma Atrial Fibrillation C O P D G E R D Mitral Valve Prolapse - moderate mitral regurgitation 2007 (cardiac catheterization, Dr. Jaci Standard) Nasal polyp Chronic rhinitis Obstructive sleep apnea  Past Surgical History: Last updated: 05/31/2010 Laparoscopic Nissen fundoplication [id number] Appendectomy 10 years ago Nasal surgery Bilateral cataract surgery  Family History: Last updated: 29-Jun-2010 Both parents died colon cancer Brother smoked - died of lung cancer Brother smoked - now has lung cancer Sister- Has AFib/ defibrillator  Social History: Last updated: 05/31/2010 Tobacco Use - Former (quit 30 years ago) Alcohol Use - no Single   Risk Factors: Smoking Status: quit (09/24/2010)  Family History: Reviewed history from June 29, 2010 and no changes required. Both parents died colon cancer Brother smoked - died of lung cancer Brother smoked - now has lung cancer Sister- Has AFib/ defibrillator  Social History: Reviewed history from 05/31/2010 and no changes required. Tobacco Use - Former (quit 30 years ago) Alcohol Use - no Single   Vital Signs:  Patient profile:   77 year old female O2 Sat:      94 % on Room air Temp:     98.2 degrees F axillary  Pulse rate:   120 / minute BP supine:   102 / 68  O2 Flow:  Room air  Physical Exam  General:  thin.   Head:  normocephalic and atraumatic Eyes:  PERRLA/EOM intact; conjunctiva and sclera clear Ears:  TMs intact and clear with normal canals Nose:  no deformity, discharge, inflammation, or lesions Mouth:  no  deformity or lesions Neck:  no masses, thyromegaly, or abnormal cervical nodes Chest Wall:  no deformities noted Lungs:  decreased BS bilateral.   no crackles no wheeze no distress able to lie flat no acc muscle use Heart:  regular rate and rhythm, S1, S2 without murmurs, rubs, gallops, or clicks Abdomen:  bowel sounds positive; abdomen soft and non-tender without masses, or organomegaly Msk:  no deformity or scoliosis noted with normal posture Pulses:  pulses normal Extremities:  no clubbing, cyanosis, edema, or deformity noted Neurologic:  CN II-XII grossly intact with normal reflexes, coordination, muscle strength and tone Skin:  intact without lesions or rashes Cervical Nodes:  no significant adenopathy Axillary Nodes:  no significant adenopathy Psych:  alert and cooperative; normal mood and affect; normal attention span and concentration   CXR  Procedure date:  09/23/2010  Findings:      CHEST - 2 VIEW    Comparison: 08/21/2010    Findings: Heart is enlarged.  There are bilateral pleural   effusions.  Bibasilar opacities obscuring the hemidiaphragms   bilaterally, consistent with atelectasis or infiltrates.  There are   biapical pleural parenchymal changes right greater than left.   Surgical clips are seen in the GE junction region.    IMPRESSION:    1.  Cardiomegaly.   2.  Bibasilar opacities and pleural effusions, similar to the prior   study.    Read By:  Glenice Bow,  M.D.   Released By:  Glenice Bow,  M.D.  MISC. Report  Procedure date:  09/23/2010  Findings:      WBC                                      4.0               4.0-10.5         K/uL  RBC                                      4.31              3.87-5.11        MIL/uL  Hemoglobin (HGB)                         11.2       l      12.0-15.0        g/dL  Hematocrit (HCT)                         33.4       l      36.0-46.0        %  MCV  77.5       l       78.0-100.0       fL  MCH -                                    26.0              26.0-34.0        pg  MCHC                                     33.5              30.0-36.0        g/dL  RDW                                      17.4       h      11.5-15.5        %  Platelet Count (PLT)                     155               150-400          K/uL  odium (NA)                              136               135-145          mEq/L  Potassium (K)                            4.0               3.5-5.1          mEq/L  Chloride                                 102               96-112           mEq/L  CO2                                      25                19-32            mEq/L  Glucose                                  127        h      70-99            mg/dL  BUN                                      25  h      6-23             mg/dL  Creatinine                               1.14              0.4-1.2          mg/dL  MISC. Report  Procedure date:  09/23/2010  Findings:      Beta Natriuretic Peptide                 339.0      h      0.0-100.0        pg/mL  Impression & Recommendations:  Problem # 1:  SHORTNESS OF BREATH (SOB) (ICD-786.05) Assessment New  Given history of dyspnea/orthopnea worsening after onset of A Fib and Systolic CHF and persistence of symoptoms corresponding with persistent tachycardia, transudative pleural effusion and high BNP I strongly suspect that symptosm are due to cardiac issues over pulmonary. Doing a PFT now will not help due to presenne of CHf and pleural effusions.   I reocmmend continued CHF optimizatin as outlined by cardiology I will find out from Dr Annamaria Boots if there is any role for Ihaled steroids for her obstructive lung disease I doubt there is a role for prednisone burst - this has not worked in past in fall 2011 Would recommend therapeutic thoracentesis as temporizing measure to help her through cath if orthopnea persists (however, I was able to get her  to lie flat for over 5 minutes without problems in HR or pulse ox) Continue diuresis Will continue to follow  Orders: No Charge Patient Arrived (NCPA0) (NCPA0)  Patient Instructions: 1)  dd

## 2010-10-10 NOTE — Assessment & Plan Note (Signed)
Summary: rov 2 months///kp   Copy to:  Dr. [name] Primary Provider/Referring Provider:  Dr. Sharilyn Sites  CC:  2 month follow up visit-Chronic Bronchitis; SOB and getting up thick white phlegm this morning.Marland Kitchen  History of Present Illness:  June 20, 2010- Chronic asthma/ COPD, Chronic rhinosinusitis, AFib Nurse CC: 3 week follow up visit-chest congestion and hoarseness-better than 3 weeks ago but still there. Atrial fib noted last time and addressed by Dr Domenic Polite. Since then she feels "anxious", but not palpitation. Now on cardizem. She couldn't breath well on prednisone 10 mg aftrer last here. She completed a burst and taper 2 days ago and so far feels breathing is good.   July 10, 2010- Chronic asthma/ COPD, Chronic rhinosinusitis, AFib Nurse-CC: Acute Visit , pt c/o increase swelling in lower extremities, increase sob , chest tightness x 1 week, HR irregular 135-144 Gradually more aware of swelling and chest congestion over last two weeks. Was better while on prednisone after last here. She relates swelling to her use of cardizem over the same length of tme.  Echo showed moderate effusion, stiff LV, LAE, EF 45%.  Dr Domenic Polite intended increase to Cardizem 180 but she never got that script. Dr Armandina Gemma gave her [facility] sample which didn't help. Ran out of lasix 40 mg/day yesterday.   August 21, 2010-  Chronic asthma/ COPD, Chronic rhinosinusitis, AFib, Transudative pleural effusion Nurse-CC: 2 month follow up visit-Chronic Bronchitis; SOB and getting up thick white phlegm this morning. Had 1.3 L right thoracentesis- benign transudative, with small residual fluid  done 07/12/10.  She feels well despite rapid AFib today, except for persistent easy DOE with any activity. Today started coughing up thick white. Sometimes a little wheeze. Denies chest pain, syncope, blood, fever or purulent.  Feet do swell, but have been worse in past. Uses neb albuterol every 4-5 hours, saying Xopenex  by neb didn't help much.   Asthma History    Asthma Control Assessment:    Age range: 12+ years    Symptoms: throughout the day    Nighttime Awakenings: 0-2/month    Interferes w/ normal activity: some limitations    SABA use (not for EIB): several times per day    Asthma Control Assessment: Very Poorly Controlled   Preventive Screening-Counseling & Management  Alcohol-Tobacco     Smoking Status: quit     Year Quit: 1980     Pack years: 10-15  Current Medications (verified): 1)  Lipitor 10 Mg  Tabs (Atorvastatin Calcium) .... Take 1 Tablet By Mouth Once A Day 2)  Nexium 40 Mg  Cpdr (Esomeprazole Magnesium) .... Take 1 Capsule By Mouth Two Times A Day 3)  Caltrate 600+d 600-400 Mg-Unit  Tabs (Calcium Carbonate-Vitamin D) .... Take 1 Tablet By Mouth Once A Day 4)  Lasix 40 Mg  Tabs (Furosemide) .... As Needed 5)  Klor-Con M20 20 Meq  Tbcr (Potassium Chloride Crys Cr) .Marland Kitchen.. 1 With Each Lasix Dose 6)  Xolair 150 Mg  Solr (Omalizumab) .... 300mg  Every 4 Weeks 7)  Proair Hfa 108 (90 Base) Mcg/act  Aers (Albuterol Sulfate) .Marland Kitchen.. 1-2 Puffs Every 4-6 Hours As Needed 8)  Twinject Epinephrine 9)  Nasonex 50 Mcg/act Susp (Mometasone Furoate) .... 2 Sprays Each Nostril Once Daily1 10)  Vitamin D 1000 Unit Tabs (Cholecalciferol) .... Take 1 Tab Daily 11)  Cardizem Cd 180 Mg Xr24h-Cap (Diltiazem Hcl Coated Beads) .... Take 1 Tablet By Mouth Once Daily  Allergies (verified): 1)  ! Penicillin 2)  !  Doxycycline 3)  ! * Clindamycin 4)  ! Augmentin 5)  ! Biaxin  Past History:  Past Medical History: Last updated: 05/31/2010 Asthma Atrial Fibrillation C O P D G E R D Mitral Valve Prolapse - moderate mitral regurgitation 2007 (cardiac catheterization, Dr. Jaci Standard) Nasal polyp Chronic rhinitis Obstructive sleep apnea  Past Surgical History: Last updated: 05/31/2010 Laparoscopic Nissen fundoplication [id number] Appendectomy 10 years ago Nasal surgery Bilateral cataract surgery  Family  History: Last updated: 16-Jul-2010 Both parents died colon cancer Brother smoked - died of lung cancer Brother smoked - now has lung cancer Sister- Has AFib/ defibrillator  Social History: Last updated: 05/31/2010 Tobacco Use - Former (quit 30 years ago) Alcohol Use - no Single   Risk Factors: Smoking Status: quit (08/21/2010)  Review of Systems      See HPI       The patient complains of shortness of breath with activity, productive cough, irregular heartbeats, and hand/feet swelling.  The patient denies shortness of breath at rest, non-productive cough, coughing up blood, chest pain, acid heartburn, indigestion, loss of appetite, weight change, abdominal pain, difficulty swallowing, sore throat, tooth/dental problems, headaches, nasal congestion/difficulty breathing through nose, sneezing, itching, ear ache, rash, change in color of mucus, and fever.    Vital Signs:  Patient profile:   77 year old female Height:      67 inches Weight:      197.38 pounds BMI:     31.03 O2 Sat:      97 % on Room air Pulse rate:   140 / minute BP sitting:   122 / 68  (left arm) Cuff size:   regular  Vitals Entered By: Clayborne Dana CMA (August 21, 2010 10:16 AM)  O2 Flow:  Room air CC: 2 month follow up visit-Chronic Bronchitis; SOB and getting up thick white phlegm this morning.   Physical Exam  Additional Exam:  General: A/Ox3; pleasant and cooperative, NAD, SKIN: no rash, lesions NODES: no lymphadenopathy HEENT: [place]/AT, EOM- WNL, Conjuctivae- clear,  ., PERRLA, TM-WNL, Nose-no visible polyps, Throat- clear and wnl, Mallampatii II, dentures NECK: Supple w/ fair ROM, JVD- none, normal carotid impulses w/o bruits Thyroid-  CHEST:No  wheeze, dull right base, no rub or cough  HEART: irregularly irregular no m/g/r heard. HR today is ranging 1140-160 ABDOMEN: soft, nontender [id number], nl pulses, 2- 3+ edema  NEURO: Grossly intact to observation      Impression &  Recommendations:  Problem # 1:  PLEURAL EFFUSION (ICD-511.9)  Transudative cardiogenic effusion has reaccumulated. We will repeat CXR, but not repeatedly tap her for now. I explained that it would stabilize at its presure level and the goal will be optimization of cardiac function.   Problem # 2:  UNSPECIFIED SECONDARY CARDIOMYOPATHY (ICD-425.9) She will revisit with Dr Domenic Polite on this on Friday. We will update BNP and renal function in advance of his visit.  She was afraid she would be abandoned because she is on Medicare. I assurred her if we couldn't help, we would talk with her about referral.   Problem # 3:  ATRIAL FIBRILLATION WITH RAPID VENTRICULAR RESPONSE (ICD-427.31) Assessment: Comment Only Diescussed her tachycardia. she appears stable till next cardiology visit. it certainly contributes to her dyspnea.   Problem # 4:  COPD ([id number]) COPD/ chronic obstructive asthma, with symptoms overlapping effect of her cardiac disease. The effusion is taking up space as well.  Her regular use of albuterol by nebulizer is likely to raise her heart rate. So far she  hasn't needed home O2, and resting sat today is 97%.   Other Orders: T-2 View CXR (71020TC) Est. Patient Level IV [id number]) TLB-BMP (Basic Metabolic Panel-BMET) (99991111) TLB-CBC Platelet - w/Differential (85025-CBCD) TLB-BNP (B-Natriuretic Peptide) (83880-BNPR)  Patient Instructions: 1)  Please schedule a follow-up appointment in 3 months. 2)  A chest x-ray has been recommended.  Your imaging study may require preauthorization.  3)  lab 4)  Keep appointment with your cardiologist.  5)  ................................................................................................... 6)  CHEST - 2 VIEW 7)   Comparison: Portable chest x-ray of 07/12/2010 8)   Findings: There is cardiomegaly present with bilateral pleural 9)  effusions remaining right greater than left.  The right effusion 10)  does appear to have  increased somewhat in volume.  No bony 11)  abnormality is seen. 12)   IMPRESSION: 13)  Cardiomegaly with bilateral pleural effusions right greater than 14)  left. 15)    16)  Read By:  Joretta Bachelor,  M.D.     17)  Released By:  Joretta Bachelor,  M.D. 18)  ........................................................................................................Marland Kitchen 19)  Patient: [name] 20)  Note: All result statuses are Final unless otherwise noted. 21)  Tests: (1) BMP (METABOL) 22)    Sodium                    140 mEq/L                   135-145 23)    Potassium                 4.3 mEq/L                   3.5-5.1 24)    Chloride                  106 mEq/L                   96-112 25)    Carbon Dioxide            26 mEq/L                    19-32 26)    Glucose                   91 mg/dL                    70-99 27)    BUN                  [H]  34 mg/dL                    6-23 28)    Creatinine           [H]  1.3 mg/dL                   0.4-1.2 29)    Calcium                   8.9 mg/dL                   8.4-10.5 30)    GFR                  [L]  42.15 mL/min                >60.00 31)  Tests: (2) CBC Platelet w/Diff (CBCD) 32)  White Cell Count          5.0 K/uL                    4.5-10.5 33)    Red Cell Count            4.36 Mil/uL                 3.87-5.11 34)    Hemoglobin           [L]  11.9 g/dL                   12.0-15.0 35)    Hematocrit                36.4 %                      36.0-46.0 36)    MCV                       83.5 fl                     78.0-100.0 37)    MCHC                      32.8 g/dL                   30.0-36.0 38)     39)  BNP 679.6 40)

## 2010-10-10 NOTE — Procedures (Signed)
Summary: Upper Endoscopy  Patient: [name] Note: All result statuses are Final unless otherwise noted.  Tests: (1) Upper Endoscopy (EGD)   EGD Upper Endoscopy       [facility]     [place], [place]  [address]           ENDOSCOPY PROCEDURE REPORT           PATIENT:  [name], [name]  MR#:  [id number]     BIRTHDATE:  April 24, 1934, 76 yrs. old  GENDER:  female     ENDOSCOPIST:  Milus Banister, MD     PROCEDURE DATE:  10/02/2010     PROCEDURE:  EGD with biopsy, 43239     ASA CLASS:  Class II     INDICATIONS:  intermittent dyshpagia, anemia     MEDICATIONS:  Fentanyl 25 mcg IV, Versed 2 mg IV, There was     residual sedation effect present from prior procedure.     TOPICAL ANESTHETIC:  Cetacaine Spray           DESCRIPTION OF PROCEDURE:   After the risks benefits and     alternatives of the procedure were thoroughly explained, informed     consent was obtained.  The [id number]) endoscope was     introduced through the mouth and advanced to the second portion of     the duodenum, without limitations.  The instrument was slowly     withdrawn as the mucosa was fully examined.     <<PROCEDUREIMAGES>>     There was mild to moderate gastritis that had somewhat of a portal     gastropathy look. Biopsies were taken and sent to pathology (see     image006).  Previous fundoplication anatomy of proximal stomach     (see image005).  Otherwise the examination was normal (see     image004 and image002).    Retroflexed views revealed no     abnormalities.    The scope was then withdrawn from the patient     and the procedure completed.     COMPLICATIONS:  None           ENDOSCOPIC IMPRESSION:     1) Moderate gastritis, biopsied to check for H. pylori (?portal     gastropathy)     2) Post-fundoplication appearance of GE junction without     significant lumenal narrowing (GE junction 17-18mm)     3) Otherwise normal examination           RECOMMENDATIONS:     Should stay on PPI once daily.     If biopsies show H. pylori, she will be started on the     appropriate antibiotics.     The previous fundoplication is causing minor narrowing at GE     junction, she knows to chew food well and eat slowly.           ______________________________     Milus Banister, MD           n.     eSIGNED:   Milus Banister at 10/02/2010 11:06 AM           Toribio Harbour, [id number]  Note: An exclamation mark (!) indicates a result that was not dispersed into the flowsheet. Document Creation Date: 10/02/2010 11:06 AM _______________________________________________________________________  (1) Order result status: Final Collection or observation date-time: 10/02/2010 10:53 Requested date-time:  Receipt  date-time:  Reported date-time:  Referring Physician:   Ordering Physician: Owens Loffler [phone]) Specimen Source:  Source: Tawanna Cooler Order Number: [id number] Lab site:

## 2010-10-10 NOTE — Letter (Signed)
Summary: Cardiac Catheterization Instructions- [facility]  [facility] [place]. [address] [place] [address]   Phone: [phone]  Fax: [phone]     09/16/2010 MRN: [id number]  [name] [address] [place], [place]  [address]  Dear [name],   You are scheduled for a Cardiac Catheterization on ___01-16-2012______ with Dr.__Stuckey____________  Please arrive to the 1st floor of the [facility] Center at [facility] at _8:00____ am on the day of your procedure. Please do not arrive before 6:30 a.m. Call the Heart and Vascular Center at [phone] if you are unable to make your appointmnet. The Code to get into the parking garage under the building is_0030_______. Take the elevators to the 1st floor. You must have someone to drive you home. Someone must be with you for the first 24 hours after you arrive home. Please wear clothes that are easy to get on and off and wear slip-on shoes. Do not eat or drink after midnight except water with your medications that morning. Bring all your medications and current insurance cards with you.  _X__ DO NOT take these medications before your procedure: Lasix ________________________________________________________________  ___ Make sure you take your aspirin.  ___ You may take ALL of your medications with water that morning. ________________________________________________________________________________________________________________________________  ___ DO NOT take ANY medications before your procedure.  ___ Pre-med instructions:  ________________________________________________________________________________________________________________________________  The usual length of stay after your procedure is 2 to 3 hours. This can vary.  If you have any questions, please call the office at the number listed above.   Jeani Hawking Via LPN

## 2010-10-10 NOTE — Assessment & Plan Note (Signed)
Summary: F2M   Visit Type:  Follow-up Referring Provider:  Dr. [name] Primary Provider:  Dr. Sharilyn Sites   History of Present Illness: 77 year old woman presents for followup. I saw her back in October. She was seen recently by Dr. Annamaria Boots for followup. She was tearful today, stating that she is having trouble with more shortness of breath. Feels generally "congested" on the right. No cough or hemoptysis. Reports compliance with her medications including diuretic once daily. She is not reporting any angina. Has had general sense of increased heart rate and palpitations.  Recent lab work ordered by Dr. Annamaria Boots from 14 December shows sodium 140, potassium 4.3, BUN 34, creatinine 1.3, hemoglobin 11.9, platelets 177, BNP 679. Chest x-ray is also reviewed below. She reports undergoing a thoracentesis since I last saw her.  Today we reviewed her cardiac testing including prior cardiac catheterization in 2007, and more recent echocardiogram. I outlined with her some potential medication adjustments, and if she continues to be symptomatic despite these changes, we may need to consider further evaluation.  Clinical Review Panels:  Echocardiogram Echocardiogram Study Conclusions            - Left ventricle: The cavity size was normal. There was mild       concentric hypertrophy. Systolic function was mildly to moderately       reduced. The estimated ejection fraction was in the range of 40%       to 45%. Mild to moderate hypokinesis of the anteroseptal and       anterolateral myocardium, possibly the result of inadequate LV       filling due to AF.     - Mitral valve: Mild to moderate regurgitation.     - Left atrium: The atrium was moderately dilated.     - Right ventricle: The cavity size was normal. Wall thickness was       moderately increased. Systolic function was mildly to moderately       reduced.     - Right atrium: The atrium was mildly to moderately dilated.     - Atrial septum:  No defect or patent foramen ovale was identified.     - Pericardium, extracardiac: There was a moderate-sized left pleural       effusion. (06/03/2010)  Cardiac Imaging Cardiac Cath Findings FINDINGS:  The aortic pressure was 126/69, LV pressure was 130/15 with an  EDP of 18.  The right atrial mean pressure was 12, RV pressure was 40/9.  PA  pressure was 40/21 with a mean of 29. Coronary wedge pressure mean was 17.  Fick cardiac output was an inaccurate.  Her thermodilution cardiac output  was 6.2.   FINDINGS:  Left heart catheterization:  The left main coronary artery  trifurcates into the left anterior descending, ramus and circumflex vessel.  There was no disease noted in the left main coronary artery.  Left anterior  descending:  Left anterior descending was a large-caliber vessel. It gives  rise to a large septal perforator which provides anterolateral circulation.  It goes on to end as an apical branch.  There is no disease noted in the  left anterior descending.  The ramus vessel:  The ramus vessel is large-  caliber vessel.  It provides a significant portion of the lateral  myocardium.  Circumflex vessel:  Circumflex vessel covers the distribution  of the right coronary artery providing distribution to the inferior,  posterior and lateral ventricles.  There was no disease noted in the  circumflex.  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   Single-plane ventriculogram revealed hypokinesis of the mid and  anterolateral portion.  There was otherwise normal wall motion.  Ejection  fraction was 65%.  There was moderate mitral regurgitation noted with  moderate left atrial enlargement noted. (12/04/2005)    Current Medications (verified): 1)  Lipitor 10 Mg  Tabs (Atorvastatin Calcium) .... Take 1 Tablet By Mouth Once A Day 2)  Nexium 40 Mg  Cpdr (Esomeprazole Magnesium) .... Take 1 Capsule By Mouth Two Times A Day 3)  Caltrate 600+d 600-400  Mg-Unit  Tabs (Calcium Carbonate-Vitamin D) .... Take 1 Tablet By Mouth Once A Day 4)  Lasix 40 Mg  Tabs (Furosemide) .... Take 1 Tablet By Mouth Two Times A Day 5)  Klor-Con M20 20 Meq  Tbcr (Potassium Chloride Crys Cr) .... Take 1 Tablet By Mouth Once A Day 6)  Xolair 150 Mg  Solr (Omalizumab) .... 300mg  Every 4 Weeks 7)  Proair Hfa 108 (90 Base) Mcg/act  Aers (Albuterol Sulfate) .Marland Kitchen.. 1-2 Puffs Every 4-6 Hours As Needed 8)  Twinject Epinephrine 9)  Nasonex 50 Mcg/act Susp (Mometasone Furoate) .... 2 Sprays Each Nostril Once Daily1 10)  Vitamin D 1000 Unit Tabs (Cholecalciferol) .... Take 1 Tab Daily 11)  Cardizem Cd 240 Mg Xr24h-Cap (Diltiazem Hcl Coated Beads) .... Take 1 Tablet By Mouth Once A Day  Allergies (verified): 1)  ! Penicillin 2)  ! Doxycycline 3)  ! * Clindamycin 4)  ! Augmentin 5)  ! Biaxin  Comments:  Nurse/Medical Assistant: patient didn't bring meds or list states that all meds are the same as when she went to [facility] on wednesday patient goes to [place] [facility]  Past History:  Past Medical History: Last updated: 05/31/2010 Asthma Atrial Fibrillation C O P D G E R D Mitral Valve Prolapse - moderate mitral regurgitation 2007 (cardiac catheterization, Dr. Jaci [place]) Nasal polyp Chronic rhinitis Obstructive sleep apnea  Social History: Last updated: 05/31/2010 Tobacco Use - Former (quit 30 years ago) Alcohol Use - no Single   Review of Systems       The patient complains of dyspnea on exertion.  The patient denies anorexia, fever, weight loss, chest pain, syncope, peripheral edema, prolonged cough, hemoptysis, abdominal pain, melena, and hematochezia.         Otherwise reviewed and negative except as outlined above.  Vital Signs:  Patient profile:   77 year old female Weight:      192 pounds O2 Sat:      93 % on Room air Pulse rate:   99 / minute BP sitting:   133 / 74  (right arm)  Vitals Entered By: Doretha Sou, CNA (August 23, 2010 9:00 AM)  O2 Flow:  Room air  Physical Exam  Additional Exam:  Overweight woman in no acute distress, tearful. HEENT: Conjunctiva and lids normal, oropharynx with moist mucosa. Neck: Supple, no elevated JVP or thyromegaly. Lungs: Generally diminished breath sounds, no wheezing today, diminished presently at the right base. No egophony. Cardiac: Irregularly irregular, somewhat distant, no loud systolic murmur or pericardial rub. Abdomen: Soft, nontender, bowel sounds present. Skin: Warm and dry. Extremities: Trace ankle edema, distal pulses one plus. Musculoskeletal: No kyphosis. Neuropsychiatric: Alert and oriented x3, somewhat anxious.   CXR  Procedure date:  08/21/2010  Findings:      CHEST - 2 VIEW   Comparison: Portable chest x-ray of  07/12/2010   Findings: There is cardiomegaly present with bilateral pleural effusions remaining right greater than left.  The right effusion does appear to have increased somewhat in volume.  No bony abnormality is seen.   IMPRESSION: Cardiomegaly with bilateral pleural effusions right greater than left.  Impression & Recommendations:  Problem # 1:  ATRIAL FIBRILLATION (ICD-427.31)  Still concerned that heart rate is not adequately controlled, perhaps leading to tachycardia mediated cardiomyopathy, and contributing to recurrent pleural effusions. Plan to increase Cardizem CD 240 mg daily. Will also need to discuss Coumadin with her again.   Problem # 2:  UNSPECIFIED SECONDARY CARDIOMYOPATHY (ICD-425.9)  At this point presumed to be tachycardia mediated. Cardiac catheterization from 2007 is reviewed above, showing no significant obstructive CAD. Plan to increase Cardizem CD as detailed above, and also advance Lasix to 40 mg p.o. b.i.d. with continued potassium supplementation. I will have her come back over the next month with a repeat BMET. I am concerned that if her symptoms continue with recurrent pleural  effusions, we may need to consider a repeat right and left heart catheterization to verify that the situation has not changed. I discussed this with her today.  Her updated medication list for this problem includes:    Lasix 40 Mg Tabs (Furosemide) .Marland Kitchen... Take 1 tablet by mouth two times a day    Cardizem Cd 240 Mg Xr24h-cap (Diltiazem hcl coated beads) .Marland Kitchen... Take 1 tablet by mouth once a day  Other Orders: Future Orders: T-Basic Metabolic Panel (99991111) ... 09/27/2010 T-BNP  (B Natriuretic Peptide) [phone]) ... 09/27/2010  Patient Instructions: 1)  Your physician recommends that you schedule a follow-up appointment in: 1 month 2)  Your physician recommends that you return for lab work in:3 weeks 3)  Your physician has recommended you make the following change in your medication: increase cardizem cd to 240mg  daily, change lasix to 40mg  twice daily at 8am and 1 pm, take potassium 46mEq daiy Prescriptions: KLOR-CON M20 20 MEQ  TBCR (POTASSIUM CHLORIDE CRYS CR) Take 1 tablet by mouth once a day  #90 x 3   Entered by:   Tye Savoy RN   Authorized by:   Beckie Salts, MD, [facility]   Signed by:   Tye Savoy RN on 08/23/2010   Method used:   Electronically to        [facility] (mail-order)       [address] [place]  [address]       Ph: [phone]       Fax: [phone]   [id number] LASIX 40 MG  TABS (FUROSEMIDE) Take 1 tablet by mouth two times a day  #180 x 3   Entered by:   Tye Savoy RN   Authorized by:   Beckie Salts, MD, [facility]   Signed by:   Tye Savoy RN on 08/23/2010   Method used:   Electronically to        [facility] (mail-order)       [address] [place]  [address]       Ph: [phone]       Fax: [phone]   [id number] CARDIZEM CD 240 MG XR24H-CAP (DILTIAZEM HCL COATED BEADS) Take 1 tablet by mouth once a day  #90 x 3   Entered by:   Tye Savoy RN   Authorized by:   Roney Marion  Domenic Polite, MD, [facility]   Signed by:   Tye Savoy RN on 08/23/2010   Method used:   Electronically to        [facility] (mail-order)       [address] [place]  [address]       Ph: [phone]       Fax: [phone]   [id number]:[id number]

## 2010-10-10 NOTE — Progress Notes (Signed)
Summary: PT CHECKING TO SEE ID RX WERE SENT OFF  Phone Note Call from Patient Call back at Home Phone [phone]   Caller: PT Reason for Call: Talk to Nurse Summary of Call: PER TP SHE WAS TOLD THAT AT HER LAST VISIT SOME RX WOULD BE CALLED IN FOR HER FOR LONG TERM AND SHE WOULD GET THEM IN ABOUT TEN DAYS. SHE IS CHECKING IF THIS WAS DONE. Initial call taken by: Nevada Crane,  September 05, 2010 1:18 PM  Follow-up for Phone Call        Pt. provided me with a phone number to reach [facility] [phone] we faxed these RX's to them on 12-16. I called twice and was put on hold for over 20 mins. each time and never spoke with anyone. I refaxed [phone] to [facility] and [facility] [place] Asked pt. to call office if she has not recieved her meds in 10 days. I will continue to call [facility] to ensure RX's are filled. Follow-up by: Jeani Hawking Via LPN,  December 29, 624THL 3:34 PM    Additional Follow-up for Phone Call Additional follow up Details #2::    Rx's refaxed to [phone], [facility] confirmed that they did receive and are refilling. Follow-up by: Jeani Hawking Via LPN,  December 29, 624THL 5:12 PM  New/Updated Medications: LASIX 40 MG  TABS (FUROSEMIDE) Take 1 tablet by mouth two times a day KLOR-CON M20 20 MEQ  TBCR (POTASSIUM CHLORIDE CRYS CR) Take 1 tablet by mouth once a day CARDIZEM CD 240 MG XR24H-CAP (DILTIAZEM HCL COATED BEADS) Take 1 tablet by mouth once a day Prescriptions: CARDIZEM CD 240 MG XR24H-CAP (DILTIAZEM HCL COATED BEADS) Take 1 tablet by mouth once a day  #90 x 3   Entered by:   Jeani Hawking Via LPN   Authorized by:   Beckie Salts, MD, [facility]   Signed by:   Jeani Hawking Via LPN on 624THL   Method used:   Electronically to        [facility] ([place]  [address]       Ph: [phone]       Fax: [phone]   RxID:   [id number] KLOR-CON M20 20 MEQ  TBCR (POTASSIUM CHLORIDE  CRYS CR) Take 1 tablet by mouth once a day  #90 x 3   Entered by:   Jeani Hawking Via LPN   Authorized by:   Beckie Salts, MD, [facility]   Signed by:   Jeani Hawking Via LPN on 624THL   Method used:   Electronically to        [facility] (mail-[place]  [address]       Ph: [phone]       Fax: [phone]   RxIDRX:[id number] LASIX 40 MG  TABS (FUROSEMIDE) Take 1 tablet by mouth two times a day  #180 x 3   Entered by:   Jeani Hawking Via LPN   Authorized by:   Beckie Salts, MD, [facility]   Signed by:   Jeani Hawking Via LPN on 624THL   Method used:   Electronically to        [facility] (mail-order)       [place]  [address]       Ph: [phone]       Fax: [phone]   RxIDDJ:[id number] LASIX 40 MG  TABS (FUROSEMIDE) Take 1 tablet by mouth two times a day  #28 x 0   Entered by:   Jeani Hawking Via LPN   Authorized by:   Beckie Salts, MD, [facility]   Signed by:   Jeani Hawking Via LPN on 624THL   Method used:   Electronically to        [facility]. [phone]* (retail)       [address]       [place], [place]  [address]       Ph: [phone] or [phone]       Fax: [phone]   RxID:   [id number] KLOR-CON M20 20 MEQ  TBCR (POTASSIUM CHLORIDE CRYS CR) Take 1 tablet by mouth once a day  #14 x 0   Entered by:   Jeani Hawking Via LPN   Authorized by:   Beckie Salts, MD, [facility]   Signed by:   Jeani Hawking Via LPN on 624THL   Method used:   Electronically to        [facility]. [phone]* (retail)       [address]       [place], [place]  [address]       Ph: [phone] or [phone]       Fax: [phone]   RxID:   [id number] LASIX 40 MG  TABS (FUROSEMIDE) Take 1 tablet by mouth two times a day  #180 x 3   Entered by:   Jeani Hawking Via LPN   Authorized by:   Beckie Salts, MD, [facility]   Signed by:   Jeani Hawking Via LPN on 624THL   Method  used:   Electronically to        [facility] (mail-order)       [address] [place]  [address]       Ph: [phone]       Fax: [phone]   RxIDJI:[id number] KLOR-CON M20 20 MEQ  TBCR (POTASSIUM CHLORIDE CRYS CR) Take 1 tablet by mouth once a day  #90 x 3   Entered by:   Jeani Hawking Via LPN   Authorized by:   Beckie Salts, MD, [facility]   Signed by:   Jeani Hawking Via LPN on 624THL   Method used:   Electronically to        [facility] (mail-order)       [address] [place]  [address]       Ph: [phone]       Fax: [phone]   RxIDTM:[id number] CARDIZEM CD 240 MG XR24H-CAP (DILTIAZEM HCL COATED BEADS) Take 1 tablet by mouth once a day  #90 x 3   Entered by:   Jeani Hawking Via LPN   Authorized by:   Beckie Salts, MD, [facility]   Signed by:   Jeani Hawking Via LPN on 624THL   Method used:   Electronically to        [facility] (mail-order)       [address] [place]  [address]       Ph: [phone]  Fax: [phone]   RxIDLG:[id number]

## 2010-10-10 NOTE — Medication Information (Signed)
Summary: Refill request for Xolair/[facility]  Refill request for Xolair/[facility]   Imported By: Phillis Knack 09/11/2010 12:26:23  _____________________________________________________________________  External Attachment:    Type:   Image     Comment:   External Document

## 2010-10-11 ENCOUNTER — Encounter: Payer: Self-pay | Admitting: Adult Health

## 2010-10-16 NOTE — Assessment & Plan Note (Signed)
Summary: follow-up//jrc   Vital Signs:  Patient profile:   77 year old female Height:      67 inches Weight:      171.50 pounds BMI:     26.96 O2 Sat:      98 % on Room air Pulse rate:   67 / minute BP sitting:   126 / 72  (left arm) Cuff size:   regular  Vitals Entered By: Raymondo Band RN (October 09, 2010 9:23 AM)  O2 Flow:  Room air CC: Follow up per Dr. Lia Foyer.  Pt d/c'd from [facility] on 10/04/10 for afib.  Pt states breathing is "alot better."  Wheezing mainly in the mornings.  Prod cough with white mucus.  Denies SOB, chest tightness. Comments Medications reviewed with patient Daytime contact number verified with patient. Raymondo Band RN  October 09, 2010 9:27 AM    Copy to:  Dr Bing Quarry Primary Provider/Referring Provider:  Dr. Sharilyn Sites - pmd,. Dr Glynn Octave - Pulmonary, Dr. Domenic Polite - Cardiology, Dr Annamaria Boots - Pulmonary  CC:  Follow up per Dr. Lia Foyer.  Pt d/c'd from [facility] on 10/04/10 for afib.  Pt states breathing is "alot better."  Wheezing mainly in the mornings.  Prod cough with white mucus.  Denies SOB and chest tightness.Marland Kitchen  History of Present Illness: July 10, 2010- Chronic asthma/ COPD, Chronic rhinosinusitis, AFib Nurse-CC: Acute Visit , pt c/o increase swelling in lower extremities, increase sob , chest tightness x 1 week, HR irregular 135-144 Gradually more aware of swelling and chest congestion over last two weeks. Was better while on prednisone after last here. She relates swelling to her use of cardizem over the same length of tme.  Echo showed moderate effusion, stiff LV, LAE, EF 45%.  Dr Domenic Polite intended increase to Cardizem 180 but she never got that script. Dr Armandina Gemma gave her [facility] sample which didn't help. Ran out of lasix 40 mg/day yesterday.   August 21, 2010-  Chronic asthma/ COPD, Chronic rhinosinusitis, AFib, Transudative pleural effusion Nurse-CC: 2 month follow up visit-Chronic Bronchitis; SOB and getting up thick white phlegm this  morning. Had 1.3 L right thoracentesis- benign transudative, with small residual fluid  done 07/12/10.  She feels well despite rapid AFib today, except for persistent easy DOE with any activity. Today started coughing up thick white. Sometimes a little wheeze. Denies chest pain, syncope, blood, fever or purulent.  Feet do swell, but have been worse in past. Uses neb albuterol every 4-5 hours, saying Xopenex by neb didn't help much.   October 09, 2010-  Chronic asthma/ COPD, Chronic rhinosinusitis, AFib, Transudative pleural effusion Nurse-CC: Follow up per Dr. Lia Foyer.  Pt d/c'd from [facility] on 10/04/10 for afib.  Pt states breathing is "alot better."  Wheezing mainly in the mornings.  Prod cough with white mucus.  Denies SOB, chest tightness. Hosp for CHF/CM/AF.- DC sumary reviewed and discussed with her.  Feels "tired". Aware of a little wheeze and cough/ shite sputum. Edema gone.  Ok to resume Xolair. She has resumed Qvar. Using Proair< 1/week. Uses neb about twice/ day which is good for her. Anemia was eval by GI upper and lower endo -gastritis and tics.     Asthma History    Asthma Control Assessment:    Age range: 12+ years    Symptoms: >2 days/week    Nighttime Awakenings: 0-2/month    Interferes w/ normal activity: no limitations    SABA use (not for EIB): >2 days/week  Asthma Control Assessment: Not Well Controlled   Preventive Screening-Counseling & Management  Alcohol-Tobacco     Smoking Status: quit     Year Quit: 1980     Pack years: 10-15  Current Medications (verified): 1)  Lipitor 10 Mg  Tabs (Atorvastatin Calcium) .... Take 1 Tablet By Mouth Once A Day 2)  Nexium 40 Mg  Cpdr (Esomeprazole Magnesium) .... Take 1 Capsule By Mouth Two Times A Day 3)  Caltrate 600+d 600-400 Mg-Unit  Tabs (Calcium Carbonate-Vitamin D) .... Take 1 Tablet By Mouth Once A Day 4)  Lasix 40 Mg  Tabs (Furosemide) .... Take 1 Tablet By Mouth Once Daily 5)  Klor-Con M20 20 Meq  Tbcr (Potassium  Chloride Crys Cr) .... Take 1 Tablet By Mouth Once A Day 6)  Xolair 150 Mg  Solr (Omalizumab) .... 300mg  Every 4 Weeks 7)  Proair Hfa 108 (90 Base) Mcg/act  Aers (Albuterol Sulfate) .Marland Kitchen.. 1-2 Puffs Every 4-6 Hours As Needed 8)  Twinject Epinephrine 9)  Nasonex 50 Mcg/act Susp (Mometasone Furoate) .... 2 Sprays Each Nostril Once Daily1 10)  Vitamin D3 2000 Unit Caps (Cholecalciferol) .... Take 1 Capsule By Mouth Once A Day 11)  Cardizem Cd 240 Mg Xr24h-Cap (Diltiazem Hcl Coated Beads) .... Take 1 Tablet By Mouth Once A Day 12)  Digoxin 0.25 Mg/ml Soln (Digoxin) .... 0.0625 Mg Tablet - Pt Taking 1/2 Tablet Once Daily 13)  Nu-Iron 150 Mg Caps (Polysaccharide Iron Complex) .... Take 1 Tablet By Mouth Two Times A Day 14)  Qvar 80 Mcg/act Aers (Beclomethasone Dipropionate) .... Inhale 1 Puff Two Times A Day 15)  Warfarin Sodium 5 Mg Tabs (Warfarin Sodium) .... As Directed  Allergies (verified): 1)  ! Penicillin 2)  ! Doxycycline 3)  ! * Clindamycin 4)  ! Augmentin 5)  ! Biaxin  Past History:  Past Medical History: Last updated: 10/03/2010 Asthma Atrial Fibrillation C O P D G E R D Mitral Valve Prolapse - moderate mitral regurgitation 2007 (cardiac catheterization, Dr. Jaci [place]) Cardiomyopathy nonischemic Cath 2012 EF 25-30% Nasal polyp Chronic rhinitis Obstructive sleep apnea  Past Surgical History: Last updated: 05/31/2010 Laparoscopic Nissen fundoplication [id number] Appendectomy 10 years ago Nasal surgery Bilateral cataract surgery  Family History: Last updated: 05-Jul-2010 Both parents died colon cancer Brother smoked - died of lung cancer Brother smoked - now has lung cancer Sister- Has AFib/ defibrillator  Social History: Last updated: 05/31/2010 Tobacco Use - Former (quit 30 years ago) Alcohol Use - no Single   Risk Factors: Smoking Status: quit (10/09/2010)  Review of Systems      See HPI       The patient complains of shortness of breath with activity, productive  cough, and irregular heartbeats.  The patient denies shortness of breath at rest, non-productive cough, coughing up blood, chest pain, acid heartburn, indigestion, loss of appetite, weight change, abdominal pain, difficulty swallowing, sore throat, tooth/dental problems, headaches, nasal congestion/difficulty breathing through nose, and sneezing.    Physical Exam  Additional Exam:  General: A/Ox3; pleasant and cooperative, NAD, room air sat 98%, has lost weight SKIN: no rash, lesions NODES: no lymphadenopathy HEENT: [place]/AT, EOM- WNL, Conjuctivae- clear,  ., PERRLA, TM-WNL, Nose-no visible polyps, Throat- clear and wnl, Mallampatii II, dentures NECK: Supple w/ fair ROM, JVD- none, normal carotid impulses w/o bruits Thyroid-  CHEST:No  wheeze, trace rales L>R base  HEART: irregularly irregular no m/g/r heard. HR 67 ABDOMEN: soft, nontender [id number], nl pulses,trace edema NEURO: Grossly intact to observation  Impression & Recommendations:  Problem # 1:  EXTRINSIC ASTHMA, UNSPECIFIED (ICD-493.00) Chronic asthma withan atopic component. Xolair benefit is likely to be seasonal and can be reassessed over time.   Problem # 2:  SHORTNESS OF BREATH (SOB) (ICD-786.05)  We discussed interaction of heart and lung isues causing dyspnea. She has limited exercise tolerance and may eventually benefit from cardiac rehab. Cardiac component/ cardiomyopathy related to artrial fib,  has developed in last 2 years and is much better controlled.   Problem # 3:  ANEMIA (ICD-285.9)  Intermittently noted anemia, likely from GI loss through her tics and gastritis. At times this may be enough to contribute some to her dyspnea, especially as she remains on coumadin.  Her updated medication list for this problem includes:    Nu-iron 150 Mg Caps (Polysaccharide iron complex) .Marland Kitchen... Take 1 tablet by mouth two times a day  Medications Added to Medication List This Visit: 1)  Vitamin D3 2000 Unit Caps  (Cholecalciferol) .... Take 1 capsule by mouth once a day 2)  Digoxin 0.25 Mg/ml Soln (Digoxin) .... 0.0625 mg tablet - pt taking 1/2 tablet once daily 3)  Nu-iron 150 Mg Caps (Polysaccharide iron complex) .... Take 1 tablet by mouth two times a day 4)  Qvar 80 Mcg/act Aers (Beclomethasone dipropionate) .... Inhale 1 puff two times a day 5)  Warfarin Sodium 5 Mg Tabs (Warfarin sodium) .... As directed  Other Orders: Est. Patient Level IV [id number])  Patient Instructions: 1)  cc Dr Hilma Favors 2)  Ok to resume Xolair for your asthma   Orders Added: 1)  Est. Patient Level IV [id number]

## 2010-10-16 NOTE — Medication Information (Signed)
Summary: ccr-lr  Anticoagulant Therapy  Managed by: Edrick Oh, RN Referring MD: Domenic Polite PCP: Dr. Sharilyn Sites - pmd,. Dr Glynn Octave - Pulmonary, Dr. Domenic Polite - Cardiology, Dr Annamaria Boots - Pulmonary Supervising MD: Lattie Haw MD, Herbie [place] Indication 1: Atrial Fibrillation Lab Used: [facility] [place] Site: [place] INR POC 1.7  Dietary changes: no    Health status changes: no    Bleeding/hemorrhagic complications: no    Recent/future hospitalizations: no    Any changes in medication regimen? no    Recent/future dental: no  Any missed doses?: no       Is patient compliant with meds? yes       Allergies: 1)  ! Penicillin 2)  ! Doxycycline 3)  ! * Clindamycin 4)  ! Augmentin 5)  ! Biaxin  Anticoagulation Management History:      The patient is taking warfarin and comes in today for a routine follow up visit.  Positive risk factors for bleeding include an age of 71 years or older.  The bleeding index is 'intermediate risk'.  Negative CHADS2 values include Age > 48 years old.  Her last INR was 1.04.  Anticoagulation responsible provider: Lattie Haw MD, Herbie [place].  INR POC: 1.7.  Cuvette Lot#: [id number].    Anticoagulation Management Assessment/Plan:      The patient's current anticoagulation dose is Warfarin sodium 5 mg tabs: as directed.  The target INR is 2.0-3.0.  The next INR is due 10/17/2010.  Anticoagulation instructions were given to patient.  Results were reviewed/authorized by Edrick Oh, RN.  She was notified by Edrick Oh RN.         Prior Anticoagulation Instructions: INR 1.3 Take coumadin extra 1/2 tablet tonight then increase dose to 1 tablet once daily except 1 1/2 tablets on Mondays, Wednesdays and Fridays  Current Anticoagulation Instructions: INR 1.7 Continue coumadin 5mg  once daily except 7.5mg  on Mondays, Wednesdays and Fridays

## 2010-10-16 NOTE — Medication Information (Signed)
Summary: [facility] PER NIKKI/TMJ  Anticoagulant Therapy  Managed by: Edrick Oh, RN Referring MD: Domenic Polite PCP: Dr. Sharilyn Sites - pmd,. Dr Glynn Octave - Pulmonary, Dr. Domenic Polite - Cardiology, Dr Annamaria Boots - Pulmonary Supervising MD: Lattie Haw MD, Herbie [place] Indication 1: Atrial Fibrillation Lab Used: [facility] [place] Site: [place] INR POC 1.3  Dietary changes: no    Health status changes: no    Bleeding/hemorrhagic complications: no    Recent/future hospitalizations: yes       Details: In [facility] 1/16 - 1/27 for atrial fib with RVR and cardiac cath  Any changes in medication regimen? yes       Details: Started on couamdin 7.5mg  x 1 then 5mg  qd   Has 5mg  tablet  Recent/future dental: no  Any missed doses?: no       Is patient compliant with meds? yes      Comments: Pt teaching dose at [employer]  Discussed coumadin literature with pt.  She has adequate understanding.    Allergies: 1)  ! Penicillin 2)  ! Doxycycline 3)  ! * Clindamycin 4)  ! Augmentin 5)  ! Biaxin  Anticoagulation Management History:      The patient is taking warfarin and comes in today for a routine follow up visit.  Positive risk factors for bleeding include an age of 75 years or older.  The bleeding index is 'intermediate risk'.  Negative CHADS2 values include Age > 75 years old.  Her last INR was 1.04.  Anticoagulation responsible provider: Lattie Haw MD, Herbie Baltimore.  INR POC: 1.3.  Cuvette Lot#: [id number].    Anticoagulation Management Assessment/Plan:      The target INR is 2.0-3.0.  The next INR is due 10/10/2010.  Anticoagulation instructions were given to patient.  Results were reviewed/authorized by Edrick Oh, RN.  She was notified by Edrick Oh RN.         Current Anticoagulation Instructions: INR 1.3 Take coumadin extra 1/2 tablet tonight then increase dose to 1 tablet once daily except 1 1/2 tablets on Mondays, Wednesdays and Fridays

## 2010-10-16 NOTE — Assessment & Plan Note (Signed)
Summary: EPH   Vital Signs:  Patient profile:   77 year old female Weight:      171 pounds BMI:     26.88 Pulse rate:   98 / minute BP sitting:   122 / 70  (left arm)  Vitals Entered By: Doretha Sou, CNA (October 10, 2010 12:02 PM)  Referring Provider:  Dr Bing Quarry Primary Provider:  Dr. Sharilyn Sites - pmd,. Dr Glynn Octave - Pulmonary, Dr. Domenic Polite - Cardiology, Dr Annamaria Boots - Pulmonary   History of Present Illness: [name] is a 77 y/o CF of Dr. Domenic Polite with known history of atrial fibrillation recently placed on coumadin, COPD, dyslipidemia, and recent hospitalization for systolic CHF and cardiac catherization after evaluation in the OP clinic.  She was having ongoing symptoms of NYHA Class II-III .  It has been discussed on prior clinic visits that if her symptoms persisted a cardiac cath would be planned.  This was scheduled as a outpatient intially, but she was found to be in Afib with RVR on admission for catherization with volulme overload. She was diuresed and placed on cardiazem drip prior to catherization.  She diuresed approximately 18lbs from 188 to 171.5.  She was placed on digoxin and began on coumadin.  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  She was diagnoised with Nonischemic CM. Her HR did not completely normalize on cardizem alone and she was therefore started on Digoxin.  She was mildly hypotensive on ACE inhibitor and this was held prior to discharge.  She was seen by pulmonary as well with OP follow up on 10/09/2010 and pulmicort breathing regime was added to her medications.  She was found to be a candidate for pulmonary rehab. She was also treated for UTI during admission.  She was seen by GI secondary to microcytic anemia, a colonoscopy and EGD were completed before beginning patient on coumadin.  GI did not find any evidence of  bleeding and released to her be started on anticoagulation. She is being followed in the [place] clinic for INR monitoring.  She denies palpatations, DOE or dizziness. She is tired but her energy level has increased since discharge.  Preventive Screening-Counseling & Management  Alcohol-Tobacco     Alcohol drinks/day: 0     Smoking Status: never  Current Medications (verified): 1)  Lipitor 10 Mg  Tabs (Atorvastatin Calcium) .... Take 1 Tablet By Mouth Once A Day 2)  Nexium 40 Mg  Cpdr (Esomeprazole Magnesium) .... Take 1 Capsule By Mouth Two Times A Day 3)  Caltrate 600+d 600-400 Mg-Unit  Tabs (Calcium Carbonate-Vitamin D) .... Take 1 Tablet By Mouth Once A Day 4)  Lasix 40 Mg  Tabs (Furosemide) .... Take 1 Tablet By Mouth Once Daily 5)  Klor-Con M20 20 Meq  Tbcr (Potassium Chloride Crys Cr) .... Take 1 Tablet By Mouth Once A Day 6)  Xolair 150 Mg  Solr (Omalizumab) .... 300mg  Every 4 Weeks 7)  Proair Hfa 108 (90 Base) Mcg/act  Aers (Albuterol Sulfate) .Marland Kitchen.. 1-2 Puffs Every 4-6 Hours As Needed 8)  Twinject Epinephrine 9)  Nasonex 50 Mcg/act Susp (Mometasone Furoate) .... 2 Sprays Each Nostril Once Daily1 10)  Vitamin D3 2000 Unit Caps (Cholecalciferol) .... Take 1 Capsule By Mouth Once A Day 11)  Cardizem Cd 240 Mg Xr24h-Cap (Diltiazem Hcl Coated Beads) .... Take 1 Tablet By Mouth Once A Day 12)  Digoxin 0.25 Mg/ml Soln (Digoxin) .... 0.0625 Mg Tablet - Pt Taking 1/2 Tablet Once Daily 13)  Nu-Iron 150 Mg Caps (Polysaccharide Iron Complex) .... Take 1 Tablet By Mouth Two Times A Day 14)  Qvar 80 Mcg/act Aers (Beclomethasone Dipropionate) .... Inhale 1 Puff Two Times A Day 15)  Warfarin Sodium 5 Mg Tabs (Warfarin Sodium) .... As Directed  Allergies (verified): 1)  ! Penicillin 2)  ! Doxycycline 3)  ! * Clindamycin 4)  ! Augmentin 5)  ! Biaxin  Comments:  Nurse/Medical Assistant: patient brought med list reviewed with patient [place] [facility]  Past History:  Past  medical, surgical, family and social histories (including risk factors) reviewed, and no changes noted (except as noted below).  Past Medical History: Reviewed history from 10/03/2010 and no changes required. Asthma Atrial Fibrillation C O P D G E R D Mitral Valve Prolapse - moderate mitral regurgitation 2007 (cardiac catheterization, Dr. Jaci Standard) Cardiomyopathy nonischemic Cath 2012 EF 25-30% Nasal polyp Chronic rhinitis Obstructive sleep apnea  Past Surgical History: Reviewed history from 05/31/2010 and no changes required. Laparoscopic Nissen fundoplication [id number] Appendectomy 10 years ago Nasal surgery Bilateral cataract surgery  Family History: Reviewed history from 06/20/2010 and no changes required. Both parents died colon cancer Brother smoked - died of lung cancer Brother smoked - now has lung cancer Sister- Has AFib/ defibrillator  Social History: Reviewed history from 05/31/2010 and no changes required. Tobacco Use - Former (quit 30 years ago) Alcohol Use - no Single  Alcohol drinks/day:  0 Smoking Status:  never  Review of Systems       Mild anxiety about recurrence of symptoms.  All other systems have been reviewed and are negative unless stated above.   Physical Exam  General:  Well developed, well nourished, in no acute distress.normal appearance.   Head:  normocephalic and atraumatic Eyes:  PERRLA/EOM intact; conjunctiva and lids normal. Lungs:  Mild crackles in the bases.  No wheezes or rhonchi. Heart:  Non-displaced PMI, chest non-tender;irregular rate and rhythm, S1, S2 without murmurs, rubs or gallops. Carotid upstroke normal, no bruit. Normal abdominal aortic size, no bruits. Femorals normal pulses, no bruits. Pedals normal pulses. No edema, no varicosities. Abdomen:  Bowel sounds positive; abdomen soft and non-tender without masses, organomegaly, or hernias noted. No hepatosplenomegaly. Msk:  Back normal, normal gait. Muscle strength and tone  normal. Pulses:  pulses normal in all 4 extremities Extremities:  No clubbing or cyanosis. Neurologic:  Alert and oriented x 3. Psych:  anxious.     Impression & Recommendations:  Problem # 1:  ATRIAL FIBRILLATION (ICD-427.31) Heart Rate is well controlled.  She is a little anxious on arrival and recheck of HR is in the 80's and irregular.  EKG completed today  Aifb 83 bpm.  She will continue digoxin and cardiazem CD 240mg  as directed. BP is stable at 122/70.  She denies bleeding.  She will continue in the coumadin clinic for assessement of PT INR.    Her updated medication list for this problem includes:    Digoxin 0.25 Mg/ml Soln (Digoxin) .Marland Kitchen... 0.0625 mg tablet - pt taking 1/2 tablet once daily    Warfarin Sodium 5 Mg Tabs (Warfarin sodium) .Marland Kitchen... As directed  Problem # 2:  CHRONIC SYSTOLIC HEART FAILURE (123456) Her dry wt is 171 lbs. She is advised to weigh herself daily and report wt gain of 2-3 lbs in 1-2 days. She is advised to call us if she gains wt and to watch salt  intake.  Discussion with Dr. Domenic Polite concerning reinstitution of ACE will be done on next visit. She will need to have follow-up ECHO in 3 months to ascertain heart fx status.  She is stable at this time. Her updated medication list for this problem includes:    Lasix 40 Mg Tabs (Furosemide) .Marland Kitchen... Take 1 tablet by mouth once daily    Cardizem Cd 240 Mg Xr24h-cap (Diltiazem hcl coated beads) .Marland Kitchen... Take 1 tablet by mouth once a day    Digoxin 0.25 Mg/ml Soln (Digoxin) .Marland Kitchen... 0.0625 mg tablet - pt taking 1/2 tablet once daily    Warfarin Sodium 5 Mg Tabs (Warfarin sodium) .Marland Kitchen... As directed  Problem # 3:  SHORTNESS OF BREATH (SOB) (ICD-786.05) Assessment: Improved She is being followed by [facility] and is on QVAR inhalers.  They are planning pulmonary rehab through their office. Her updated medication list for this problem includes:    Lasix 40 Mg Tabs (Furosemide) .Marland Kitchen... Take 1 tablet by mouth once daily     Cardizem Cd 240 Mg Xr24h-cap (Diltiazem hcl coated beads) .Marland Kitchen... Take 1 tablet by mouth once a day    Digoxin 0.25 Mg/ml Soln (Digoxin) .Marland Kitchen... 0.0625 mg tablet - pt taking 1/2 tablet once daily  Problem # 4:  Hx of HYPERCHOLESTEROLEMIA (ICD-272.0) Dr. Hilma Favors follows her for this and labs are going to be drawn in the am.  We will request copies of these results. Her updated medication list for this problem includes:    Lipitor 10 Mg Tabs (Atorvastatin calcium) .Marland Kitchen... Take 1 tablet by mouth once a day   Patient Instructions: 1)  Your physician recommends that you schedule a follow-up appointment in: 3 months with Dr. Domenic Polite 2)  Your physician recommends that you continue on your current medications as directed. Please refer to the Current Medication list given to you today.

## 2010-10-16 NOTE — Progress Notes (Signed)
Summary: needs appt with in two weeks  Phone Note Call from Patient   Caller: Patient Call For: young Summary of Call: Patient phoned stated that she has an appointment with dr young on 11/19/10 but her cardiologist Dr. Lia Foyer wants her seen with in the next two weeks. Please advise when she can be worked in. Patient can be reached at [phone] Initial call taken by: Ozella Rocks,  October 08, 2010 10:44 AM  Follow-up for Phone Call        pt set to see CY on 10-10-10 at 9:30. Patterson Bing CMA  October 08, 2010 12:47 PM

## 2010-10-17 ENCOUNTER — Encounter: Payer: Self-pay | Admitting: Cardiology

## 2010-10-17 ENCOUNTER — Encounter (INDEPENDENT_AMBULATORY_CARE_PROVIDER_SITE_OTHER): Payer: Medicare Other

## 2010-10-17 DIAGNOSIS — Z7901 Long term (current) use of anticoagulants: Secondary | ICD-10-CM

## 2010-10-17 DIAGNOSIS — I4891 Unspecified atrial fibrillation: Secondary | ICD-10-CM

## 2010-10-17 LAB — CONVERTED CEMR LAB: POC INR: 2.1

## 2010-10-21 ENCOUNTER — Encounter: Payer: Self-pay | Admitting: Internal Medicine

## 2010-10-21 ENCOUNTER — Ambulatory Visit (INDEPENDENT_AMBULATORY_CARE_PROVIDER_SITE_OTHER): Payer: Medicare Other

## 2010-10-21 DIAGNOSIS — J45909 Unspecified asthma, uncomplicated: Secondary | ICD-10-CM

## 2010-10-22 ENCOUNTER — Telehealth (INDEPENDENT_AMBULATORY_CARE_PROVIDER_SITE_OTHER): Payer: Self-pay

## 2010-10-24 NOTE — Medication Information (Signed)
Summary: ccr-lr  Anticoagulant Therapy Managed by: Edrick Oh, RN Patient Assessment Part 2:  Have you MISSED ANY DOSES or CHANGED TABLETS?  -18  Have you had any BRUISING or BLEEDING ( nose or gum bleeds,blood in urine or stool)?  Have you STARTED or STOPPED any MEDICATIONS, including OTC meds,herbals or supplements?  Have you CHANGED your DIET, especially green vegetables,or ALCOHOL intake?  Have you had any ILLNESSES or HOSPITALIZATIONS?  Have you had any signs of CLOTTING?(chest discomfort,dizziness,shortness of breath,arms tingling,slurred speech,swelling or redness in leg)      New  Tablet strength: : 5mg  Regimen Out:     Sunday: 1 tab Tablet     Monday: 1 tab Tablet     Tuesday: 1 tab Tablet     Wednesday: 1 tab Tablet     Thursday: 1 tab Tablet      Friday: 1 tab Tablet     Saturday: 1 tab Tablet Total Weekly: 35 mg mg  Next INR Due: 10/31/2010      Allergies: 1)  ! Penicillin 2)  ! Doxycycline 3)  ! * Clindamycin 4)  ! Augmentin 5)  ! Biaxin   Anticoagulation Management History:      The patient is taking warfarin and comes in today for a routine follow up visit.  Positive risk factors for bleeding include an age of 75 years or older.  The bleeding index is 'intermediate risk'.  Positive CHADS2 values include History of CHF.  Negative CHADS2 values include Age > 75 years old.  Her last INR was 1.04.  Anticoagulation responsible provider: Domenic Polite MD, Mikeal Hawthorne.  INR POC: 2.1.  Cuvette Lot#: [id number].    Anticoagulation Management Assessment/Plan:      The patient's current anticoagulation dose is Warfarin sodium 5 mg tabs: as directed.  The target INR is 2.0-3.0.  The next INR is due 10/31/2010.  Anticoagulation instructions were given to patient.  Results were reviewed/authorized by Edrick Oh, RN.  She was notified by Edrick Oh RN.         Prior Anticoagulation Instructions: INR 1.7 Continue coumadin 5mg  once daily except 7.5mg  on Mondays, Wednesdays and  Fridays  Current Anticoagulation Instructions: INR 2.1 Pt has been taking coumadin 5mg  once daily per Dr Delanna Ahmadi instructions Continue coumadin 5mg  once daily    Anticoagulant Therapy  Managed by: Edrick Oh, RN Referring MD: Domenic Polite PCP: Dr. Sharilyn Sites - pmd,. Dr Glynn Octave - Pulmonary, Dr. Domenic Polite - Cardiology, Dr Annamaria Boots - Pulmonary Supervising MD: Domenic Polite MD, Mikeal Hawthorne Indication 1: Atrial Fibrillation Lab Used: [facility] [place] Site: [place] INR POC 2.1  Dietary changes: no    Health status changes: no    Bleeding/hemorrhagic complications: no    Recent/future hospitalizations: no    Any changes in medication regimen? no    Recent/future dental: no  Any missed doses?: yes     Details: Pt states Dr Hilma Favors told her to only take coumadin 5mg  qd   Is patient compliant with meds? yes

## 2010-10-25 ENCOUNTER — Ambulatory Visit: Payer: Medicare Other

## 2010-10-30 NOTE — Assessment & Plan Note (Signed)
Summary: Arvid Right ///kp  Nurse Visit   Allergies: 1)  ! Penicillin 2)  ! Doxycycline 3)  ! * Clindamycin 4)  ! Augmentin 5)  ! Biaxin  Medication Administration  Injection # 1:    Medication: Xolair (omalizumab) 150mg     Diagnosis: EXTRINSIC ASTHMA, UNSPECIFIED (ICD-493.00)    Route: SQ    Site: R deltoid    Exp Date: 09/2013    Lot #: [id number]    Mfr: GENETECH    Comments: 1.2 ML X 1 IN RIGHT AND LEFT ARM 300MG  CHARGED [phone]    Given by: [facility]  Orders Added: 1)  Administration xolair injection [id number]   Medication Administration  Injection # 1:    Medication: Xolair (omalizumab) 150mg     Diagnosis: EXTRINSIC ASTHMA, UNSPECIFIED (ICD-493.00)    Route: SQ    Site: R deltoid    Exp Date: 09/2013    Lot #: [id number]    Mfr: GENETECH    Comments: 1.2 ML X 1 IN RIGHT AND LEFT ARM 300MG  CHARGED [id number]    Given by: [facility]  Orders Added: 1)  Administration xolair injection [phone]

## 2010-10-30 NOTE — Discharge Summary (Signed)
[name], STANSFIELD NO.:  [id number]  MEDICAL RECORD NO.:  [id number]          PATIENT TYPE:  INP  LOCATION:  2004                         FACILITY:  [facility]  PHYSICIAN:  Loretha Brasil. Lia Foyer, MD, FACCDATE OF BIRTH:  [birth year]  DATE OF ADMISSION:  09/23/2010 DATE OF DISCHARGE:  10/04/2010                              DISCHARGE SUMMARY   PRIMARY CARDIOLOGIST:  Satira Sark, MD  PRIMARY CARE PHYSICIAN:  Halford Chessman, MD  PULMONOLOGIST:  Kasandra Knudsen. Young, MD, FCCP, FACP  DISCHARGE DIAGNOSES: 1. Atrial fibrillation with controlled ventricular response. 2. Systolic congestive heart failure secondary to atrial fibrillation. 3. Anemia. 4. Chronic obstructive pulmonary disease. 5. Dyslipidemia. 6. Asymptomatic Escherichia coli/urinary tract infection.  SECONDARY DIAGNOSES: 1. Mitral regurgitation. 2. Gastroesophageal reflux disease. 3. Obstructive sleep apnea.  ALLERGIES: 1. AUGMENTIN causing rash and angioedema. 2. CLINDAMYCIN causing urticaria. 3. BIAXIN, unknown reaction. 4. DOXYCYCLINE causing an unknown reaction. 5. PENICILLIN causing rash.  PROCEDURES/DIAGNOSTICS:  Performed during hospitalization: 1. Right and left heart catheterization with selective coronary     angiography, left ventriculogram, and aortic root angiogram.     a.     Minimal nonobstructive coronary artery disease. 2. 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 3. Left ventricular dysfunction with an ejection fraction of 25-30%. 4. Significant mitral regurgitation by echocardiogram and per right     heart catheterization. 5. Diagnostic colonoscopy.     a.     Mild diverticulosis in sigmoid to descending colon segment.      Internal and external hemorrhoids.     b.     Otherwise, normal exam, no polyps or cancers.     c.     Repeat colonoscopy in 5 years. 6.  Esophagogastroduodenoscopy with biopsy.     a.     Moderate gastritis with biopsy to check for H. pylori which      was negative.     b.     Post fundoplication appearance of GE junction without      significant luminal narrowing.     c.     Otherwise normal exam. 7. Chest x-ray showing cardiomegaly and bibasilar opacities and     pleural effusion similar to prior study on August 21, 2010.     Repeat chest x-ray on October 04, 2010, demonstrating cardiomegaly     with bilateral pleural effusions and bibasilar atelectasis, mildly     regressed since September 23, 2010.  REASON FOR HOSPITALIZATION:  This is a 77 year old female with the above- stated problem list who was evaluated in the Outpatient Clinic for progressive shortness of breath and increased heart rate.  The patient's Cardizem was increased on August 23, 2010, and when she reported back for followup on September 16, 2010, increased dose did not assist in heart rate control but did make the patient hypotensive.  There was discussion for repeat cardiac catheterization if her symptoms persisted. Therefore, the patient was scheduled for cardiac catheterization.  HOSPITAL COURSE:  Upon admission to [facility], the patient was found to be in atrial fibrillation with rapid ventricular response at approximately 155 beats per minute.  She was started on IV Cardizem and titrated for rate control.  The patient was also noted to be volume overloaded on exam and therefore, diuresis was initiated prior to cardiac catheterization.  The patient did diurese well.  The patient was continued on diltiazem drip as her rates were better controlled.  At this time with the patient's COPD, she is considered as COPD candidate. A 2-D echocardiogram was obtained that demonstrated depressed left ventricular systolic function with an ejection fraction between 25-30%, severe global hypokinesis.  There was at least moderate diastolic dysfunction.  At this  time, it is felt that the patient's dyspnea was secondary to tachycardia and her congestive heart failure.  A Pulmonary consult was obtained and as just stated the patient was felt to have more difficulty with her cardiac issues than her pulmonary issues.  It was noted that the patient should be changed to QVAR or Pulmicort at discharge, and Pulmicort nebulizations were added to her regimen.  At discharge, the patient will also need pulmonary rehab.  We will have Dr. Annamaria Boots set this up for the patient.  Of note, the patient was placed on heparin upon admission.  Her diltiazem was changed from IV to p.o.  This did cause her to have soft blood pressures.  Therefore, the patient was started on digoxin.  The patient's rates were better controlled on this regimen.  After catheterization, as well as okayed by GI, the patient was placed on Coumadin.  This will be continued as an outpatient.  Of note, the patient was noted to have decreased platelets with a recent drop in 48 hours.  There was felt to be a low likelihood of HIT; therefore, this was monitored closely and a HIT panel was obtained. This was negative.  After catheterization and okayed by GI, Coumadin was initiated.  The patient will be continued on this as an outpatient with outpatient followup.  The patient's rates on diltiazem and digoxin were decreased into the 50s; therefore, the patient's digoxin dose was cut in half.  She tolerated this well, and the patient's rate and pressures were stable.  As above, the patient was volume overloaded upon admission.  She was started on IV diuresis.  The patient tolerated this well and diuresed appropriately.  Her creatinine function was followed closely and this did show mild elevation but has subsequently returned to baseline.  The patient diuresed well with significant improvement in her symptoms.  The patient was taken for left and right heart catheterization to reevaluate her heart failure  and mitral regurgitation.  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  The patient will be continued on medical management.  The patient is noted to have significant mitral regurgitation by echocardiogram and on right heart catheterization.  The patient's pulmonary capillary wedge pressure had a mean of 23 with V- waves to 35 and the central aortic pressure with a mean of 90 and a PA pressure with a mean of 36.  The patient was transitioned from IV diuretics to oral diuretics.  This was held intermittently for hypotension.  The patient is currently doing well with her pressures and home oral low-dose Lasix will be continued.  ACE inhibitor was initially held secondary to hypotension.  This can be added as an outpatient by Dr. Domenic Polite as the patient's pressure stabilizes.  No  beta-blocker secondary to her COPD.  She will be continued on Lasix.  The patient was noted to have foul-smelling urine, and a urine culture was obtained that showed greater than 100,000 E. coli that were resistant to Cipro but sensitive to AMPICILLIN, with the patient being allergic to this ID consult was obtained.  There were squamous epithelial cells suggesting perineal contamination; therefore, ID states that without symptoms or possible contamination, the patient does not require treatment; therefore, no antibiotics were continued.  ID consult was appreciated.  The patient was noted to have microcytic anemia without Hemoccult positive stools; therefore, a GI consult was obtained.  Upon discussion, the patient also was noted to have dysphagia to solid; therefore, the patient was scheduled for an EGD and colonoscopy per GI.  As above, there was moderate gastritis that was checked for H. pylori which did remain negative.  She had no significant luminal narrowing and therefore dilation was not needed.  It was discussed that the patient  should chew food well and eat slowly.  The patient voiced understanding.  Her colonoscopy showed mild diverticulosis in the sigmoid to descending colon but no cause for cancer; therefore, repeat colonoscopy in 5 years. At this time, I was okay to start her on anticoagulation per GI. Therefore, the patient's Coumadin was initiated.  On day of discharge, Dr. Lia Foyer evaluated the patient and noted her stable for home with early followup with Dr. Domenic Polite.  Discharge instructions and medications have been discussed with the patient and she voices understanding.  DISCHARGE LABORATORY DATA:  WBC 3.5, hemoglobin 10.3, hematocrit 31.7, platelets 149.  INR 0.99.  Sodium 136, potassium 3.9, chloride 97, bicarb 29, BUN 11, creatinine 1.27.  DISCHARGE MEDICATIONS: 1. Digoxin 0.0625 mg 0.5 tablet daily. 2. Iron complex 150 mg one capsule twice daily. 3. QVAR 80 mcg inhalation one inhalation twice daily. 4. Coumadin 5 mg one and half tablet today with one tablet daily until     Coumadin Clinic followup. 5. Calcium carbonate/vitamin D 600/400 mg one tablet daily. 6. Cardizem CD 240 mg one capsule daily. 7. Lasix 20 mg daily. 8. Lipitor 10 mg daily. 9. Nasonex nasal spray, 2 sprays in each nostril daily. 10.Nexium 40 mg one capsule daily. 11.Potassium chloride 20 mEq daily. 12.ProAir inhaler 1-2 puffs inhaled every 4 hours as needed for     shortness of breath. 13.Vitamin D3 one tablet daily. 14.Xolair 150 mg/300 mg injection every 4 weeks.  FOLLOWUP PLANS AND INSTRUCTIONS: 1. The patient will follow up with Jory Sims, nurse     practitioner of Dr. Domenic Polite, on October 10, 2010, at 11:40 a.m. 2. The patient will call to schedule followup appointment with Dr.     Annamaria Boots and Dr. Hilma Favors in 1-2 weeks. 3. The patient will follow up in the [facility] on     Monday, October 07, 2010, at 10:00 a.m. 4. The patient is to increase activity slowly.  She may shower but no      bathing.  No lifting for 1 week over 5 pounds.  No sexual activity     for 1 week.  She is to keep her cath site clean and dry and to call     the office for any problems. 5. The patient is to continue a low-sodium heart-healthy diet. 6. She is to call our office for any problems.  DURATION OF DISCHARGE:  Greater than 45 minutes with physician and physician extender time.     Cecille Amsterdam, PA-C  ______________________________ Loretha Brasil Lia Foyer, MD, [facility]    NB/MEDQ  D:  10/04/2010  T:  10/05/2010  Job:  [id number]  cc:   Satira Sark, MD Halford Chessman, M.D. Clinton D. Annamaria Boots, MD, [facility], FACP  Electronically Signed by Pennie Rushing P.A. on 10/07/2010 01:44:17 PM Electronically Signed by Bing Quarry MD [facility] on 10/30/2010 09:07:44 PM

## 2010-10-30 NOTE — Progress Notes (Signed)
**Note De-Identified [name] Obfuscation** Summary: rx refill  Phone Note Call from Patient Call back at Home Phone [phone]   Caller: pt Reason for Call: Refill Medication Summary of Call: pt would like a three month supply of digoxin called in to [facility]. Initial call taken by: Nevada Crane,  October 22, 2010 10:32 AM    Prescriptions: DIGOXIN 0.25 MG/ML SOLN (DIGOXIN) 0.0625 mg tablet - pt taking 1/2 tablet once daily  #45 x 0   Entered by:   Jeani Hawking Saja Bartolini LPN   Authorized by:   Jory Sims, NP   Signed by:   Jeani Hawking Khelani Kops LPN on [id number]   Method used:   Faxed to ...       [facility] (retail)             , [place]         Ph: [phone]       Fax: [phone]   RxID:   [id number] DIGOXIN 0.25 MG/ML SOLN (DIGOXIN) 0.0625 mg tablet - pt taking 1/2 tablet once daily  #45 x 0   Entered by:   Jeani Hawking Reality Dejonge LPN   Authorized by:   Jory Sims, NP   Signed by:   Jeani Hawking Adanna Zuckerman LPN on [id number]   Method used:   Electronically to        [facility] (retail)       [place] [address]       [place], [place]  [address]       Ph: [phone]       Fax: [phone]   RxID:   [id number]  [facility] notified that this RX was faxed to them in error.  Jeani Hawking Trenita Hulme LPN  February 14, [id number] 11:02 AM

## 2010-10-31 ENCOUNTER — Encounter (INDEPENDENT_AMBULATORY_CARE_PROVIDER_SITE_OTHER): Payer: Medicare Other

## 2010-10-31 ENCOUNTER — Encounter: Payer: Self-pay | Admitting: Cardiology

## 2010-10-31 DIAGNOSIS — I4891 Unspecified atrial fibrillation: Secondary | ICD-10-CM

## 2010-10-31 DIAGNOSIS — Z7901 Long term (current) use of anticoagulants: Secondary | ICD-10-CM

## 2010-11-04 ENCOUNTER — Telehealth (INDEPENDENT_AMBULATORY_CARE_PROVIDER_SITE_OTHER): Payer: Self-pay | Admitting: *Deleted

## 2010-11-05 NOTE — Medication Information (Signed)
Summary: ccr-lr  Anticoagulant Therapy  Managed by: Edrick Oh, RN Referring MD: Domenic Polite PCP: Dr. Sharilyn Sites - pmd,. Dr Glynn Octave - Pulmonary, Dr. Domenic Polite - Cardiology, Dr Annamaria Boots - Pulmonary Supervising MD: Lattie Haw MD, Herbie [place] Indication 1: Atrial Fibrillation Lab Used: [facility] [place] Site: [place] INR POC 2.2  Dietary changes: no    Health status changes: no    Bleeding/hemorrhagic complications: no    Recent/future hospitalizations: no    Any changes in medication regimen? no    Recent/future dental: no  Any missed doses?: no       Is patient compliant with meds? yes       Allergies: 1)  ! Penicillin 2)  ! Doxycycline 3)  ! * Clindamycin 4)  ! Augmentin 5)  ! Biaxin  Anticoagulation Management History:      The patient is taking warfarin and comes in today for a routine follow up visit.  Positive risk factors for bleeding include an age of 75 years or older.  The bleeding index is 'intermediate risk'.  Positive CHADS2 values include History of CHF.  Negative CHADS2 values include Age > 75 years old.  Her last INR was 1.04.  Anticoagulation responsible provider: Lattie Haw MD, Herbie [place].  INR POC: 2.2.  Cuvette Lot#: [id number].    Anticoagulation Management Assessment/Plan:      The patient's current anticoagulation dose is Warfarin sodium 5 mg tabs: as directed.  The target INR is 2.0-3.0.  The next INR is due 11/21/2010.  Anticoagulation instructions were given to patient.  Results were reviewed/authorized by Edrick Oh, RN.  She was notified by Edrick Oh RN.         Prior Anticoagulation Instructions: INR 2.1 Pt has been taking coumadin 5mg  once daily per Dr Delanna Ahmadi instructions Continue coumadin 5mg  once daily   Current Anticoagulation Instructions: INR 2.2  Continue coumadin 5mg  once daily

## 2010-11-14 NOTE — Progress Notes (Signed)
Summary: rx for coumadin 5mg  one a day  Phone Note Call from Patient Call back at Home Phone [phone]   Caller: pt Reason for Call: Refill Medication Summary of Call: [facility]in [facility] and [facility] Initial call taken by: Nevada Crane,  November 04, 2010 9:25 AM    New/Updated Medications: WARFARIN SODIUM 5 MG TABS (WARFARIN SODIUM) 5mg  once daily or as directed by anticoagulation clinic Prescriptions: WARFARIN SODIUM 5 MG TABS (WARFARIN SODIUM) 5mg  once daily or as directed by anticoagulation clinic  #45 x 2   Entered by:   Tye Savoy RN   Authorized by:   Beckie Salts, MD, [facility]   Signed by:   Tye Savoy RN on 11/04/2010   Method used:   Electronically to        [facility] (retail)       [place] [address]       [place], [place]  [address]       Ph: [phone]       Fax: [phone]   RxID:   [id number]

## 2010-11-19 ENCOUNTER — Ambulatory Visit (INDEPENDENT_AMBULATORY_CARE_PROVIDER_SITE_OTHER): Payer: Medicare Other | Admitting: Internal Medicine

## 2010-11-19 ENCOUNTER — Ambulatory Visit: Payer: Medicare Other

## 2010-11-19 ENCOUNTER — Encounter: Payer: Self-pay | Admitting: Internal Medicine

## 2010-11-19 DIAGNOSIS — T7840XA Allergy, unspecified, initial encounter: Secondary | ICD-10-CM

## 2010-11-19 DIAGNOSIS — R0602 Shortness of breath: Secondary | ICD-10-CM

## 2010-11-19 DIAGNOSIS — I429 Cardiomyopathy, unspecified: Secondary | ICD-10-CM

## 2010-11-19 DIAGNOSIS — J45909 Unspecified asthma, uncomplicated: Secondary | ICD-10-CM

## 2010-11-20 LAB — PATHOLOGIST SMEAR REVIEW

## 2010-11-20 LAB — BODY FLUID CELL COUNT WITH DIFFERENTIAL
Eos, Fluid: 0 %
Lymphs, Fluid: 78 %
Neutrophil Count, Fluid: 5 % (ref 0–25)
Other Cells, Fluid: 0 %

## 2010-11-20 LAB — AFB CULTURE WITH SMEAR (NOT AT ARMC): Acid Fast Smear: NONE SEEN

## 2010-11-20 LAB — BODY FLUID CULTURE: Culture: NO GROWTH

## 2010-11-21 ENCOUNTER — Encounter (INDEPENDENT_AMBULATORY_CARE_PROVIDER_SITE_OTHER): Payer: Medicare Other

## 2010-11-21 ENCOUNTER — Encounter: Payer: Self-pay | Admitting: Cardiology

## 2010-11-21 DIAGNOSIS — I4891 Unspecified atrial fibrillation: Secondary | ICD-10-CM

## 2010-11-21 DIAGNOSIS — Z7901 Long term (current) use of anticoagulants: Secondary | ICD-10-CM

## 2010-11-26 NOTE — Medication Information (Signed)
Summary: ccr-lr  Anticoagulant Therapy  Managed by: Edrick Oh, RN Referring MD: Domenic Polite PCP: Dr. Sharilyn Sites - pmd,. Dr Glynn Octave - Pulmonary, Dr. Domenic Polite - Cardiology, Dr Annamaria Boots - Pulmonary Supervising MD: Lattie Haw MD, Herbie [place] Indication 1: Atrial Fibrillation Lab Used: [facility] [place] Site: [place] INR POC 2.0  Dietary changes: no    Health status changes: no    Bleeding/hemorrhagic complications: no    Recent/future hospitalizations: no    Any changes in medication regimen? no    Recent/future dental: no  Any missed doses?: no       Is patient compliant with meds? yes       Allergies: 1)  ! Penicillin 2)  ! Doxycycline 3)  ! * Clindamycin 4)  ! Augmentin 5)  ! Biaxin  Anticoagulation Management History:      The patient is taking warfarin and comes in today for a routine follow up visit.  Positive risk factors for bleeding include an age of 75 years or older.  The bleeding index is 'intermediate risk'.  Positive CHADS2 values include History of CHF.  Negative CHADS2 values include Age > 62 years old.  Her last INR was 1.04.  Anticoagulation responsible provider: Lattie Haw MD, Herbie [place].  INR POC: 2.0.  Cuvette Lot#: [id number].    Anticoagulation Management Assessment/Plan:      The patient's current anticoagulation dose is Warfarin sodium 5 mg tabs: 5mg  once daily or as directed by anticoagulation clinic.  The target INR is 2.0-3.0.  The next INR is due 12/19/2010.  Anticoagulation instructions were given to patient.  Results were reviewed/authorized by Edrick Oh, RN.  She was notified by Edrick Oh RN.         Prior Anticoagulation Instructions: INR 2.2  Continue coumadin 5mg  once daily   Current Anticoagulation Instructions: INR 2.0 Increase coumadin to 5mg  once daily except 7.5mg  on Thursdays

## 2010-11-26 NOTE — Assessment & Plan Note (Signed)
Summary: 3 month rov   Copy to:  Dr Bing Quarry Primary Provider/Referring Provider:  Dr. Sharilyn Sites - pmd,. Dr Glynn Octave - Pulmonary, Dr. Domenic Polite - Cardiology, Dr Annamaria Boots - Pulmonary  CC:  3 month follow up.  Pt staes breathing is doing well overall.  c/o PND and wheezing x few days.  Denies SOB, chest tightness, and cough.  .  History of Present Illness: October 09, 2010-  Chronic asthma/ COPD, Chronic rhinosinusitis, AFib, Transudative pleural effusion Nurse-CC: Follow up per Dr. Lia Foyer.  Pt d/c'd from [facility] on 10/04/10 for afib.  Pt states breathing is "alot better."  Wheezing mainly in the mornings.  Prod cough with white mucus.  Denies SOB, chest tightness. Hosp for CHF/CM/AF.- DC sumary reviewed and discussed with her.  Feels "tired". Aware of a little wheeze and cough/ shite sputum. Edema gone.  Ok to resume Xolair. She has resumed Qvar. Using Proair< 1/week. Uses neb about twice/ day which is good for her. Anemia was eval by GI upper and lower endo -gastritis and tics.    November 19, 2010- Chronic asthma/ COPD, Chronic rhinosinusitis,CM, AFib, Transudative pleural effusion, Nurse-CC: 3 month follow up.  Pt states breathing is doing well overall.  c/o PND and wheezing x few days.  Denies SOB, chest tightness, cough.   No new cardiac events ot changes since last here. her breathing seems comfortable. Pollen season blamed for mild postnasal drip- reminded ok claritin and to use her nasonex. Not using Qvar regularly. Uses Proair rescue inhaler only about once daily, when dyspneic out walking. Only minimal wheeze occasionally. . Was using it 4-5 x daily when bad in the past. No recent PFT.       Asthma History    Asthma Control Assessment:    Age range: 12+ years    Symptoms: 0-2 days/week    Nighttime Awakenings: 0-2/month    Interferes w/ normal activity: no limitations    SABA use (not for EIB): 0-2 days/week    Asthma Control Assessment: Well Controlled   Preventive  Screening-Counseling & Management  Alcohol-Tobacco     Alcohol drinks/day: 0     Smoking Status: never     Year Quit: 1980     Pack years: 10-15  Current Medications (verified): 1)  Lipitor 10 Mg  Tabs (Atorvastatin Calcium) .... Take 1 Tablet By Mouth Once A Day 2)  Nexium 40 Mg  Cpdr (Esomeprazole Magnesium) .... Take 1 Capsule By Mouth Two Times A Day 3)  Caltrate 600+d 600-400 Mg-Unit  Tabs (Calcium Carbonate-Vitamin D) .... Take 1 Tablet By Mouth Once A Day 4)  Lasix 40 Mg  Tabs (Furosemide) .... Take 1 Tablet By Mouth Once Daily 5)  Klor-Con M20 20 Meq  Tbcr (Potassium Chloride Crys Cr) .... Take 1 Tablet By Mouth Once A Day 6)  Xolair 150 Mg  Solr (Omalizumab) .... 300mg  Every 4 Weeks 7)  Proair Hfa 108 (90 Base) Mcg/act  Aers (Albuterol Sulfate) .Marland Kitchen.. 1-2 Puffs Every 4-6 Hours As Needed 8)  Twinject Epinephrine 9)  Nasonex 50 Mcg/act Susp (Mometasone Furoate) .... 2 Sprays Each Nostril Once Daily1 10)  Vitamin D3 2000 Unit Caps (Cholecalciferol) .... Take 1 Capsule By Mouth Once A Day 11)  Cardizem Cd 240 Mg Xr24h-Cap (Diltiazem Hcl Coated Beads) .... Take 1 Tablet By Mouth Once A Day 12)  Digoxin 0.125 Mg Tabs (Digoxin) .... Take 1/2 Tablet By Mouth Daily 13)  Nu-Iron 150 Mg Caps (Polysaccharide Iron Complex) .... Take 1 Tablet  By Mouth Two Times A Day 14)  Qvar 80 Mcg/act Aers (Beclomethasone Dipropionate) .... Inhale 1 Puff Two Times A Day 15)  Warfarin Sodium 5 Mg Tabs (Warfarin Sodium) .... 5mg  Once Daily or As Directed By Anticoagulation Clinic 16)  Diazepam 2 Mg Tabs (Diazepam) .... Take 1 Tablet Three Times A Day As Needed 17)  Citalopram Hydrobromide 20 Mg Tabs (Citalopram Hydrobromide) .... Take 1 Tablet Once Daily  Allergies (verified): 1)  ! Penicillin 2)  ! Doxycycline 3)  ! * Clindamycin 4)  ! Augmentin 5)  ! Biaxin  Past History:  Past Medical History: Last updated: 10/03/2010 Asthma Atrial Fibrillation C O P D G E R D Mitral Valve Prolapse - moderate  mitral regurgitation 2007 (cardiac catheterization, Dr. Jaci Standard) Cardiomyopathy nonischemic Cath 2012 EF 25-30% Nasal polyp Chronic rhinitis Obstructive sleep apnea  Past Surgical History: Last updated: 05/31/2010 Laparoscopic Nissen fundoplication [id number] Appendectomy 10 years ago Nasal surgery Bilateral cataract surgery  Family History: Last updated: 21-Jun-2010 Both parents died colon cancer Brother smoked - died of lung cancer Brother smoked - now has lung cancer Sister- Has AFib/ defibrillator  Social History: Last updated: 05/31/2010 Tobacco Use - Former (quit 30 years ago) Alcohol Use - no Single   Risk Factors: Alcohol Use: 0 (11/19/2010)  Risk Factors: Smoking Status: never (11/19/2010)  Review of Systems      See HPI       The patient complains of shortness of breath with activity and irregular heartbeats.  The patient denies shortness of breath at rest, productive cough, non-productive cough, coughing up blood, chest pain, acid heartburn, indigestion, loss of appetite, weight change, abdominal pain, difficulty swallowing, sore throat, tooth/dental problems, headaches, nasal congestion/difficulty breathing through nose, sneezing, rash, change in color of mucus, and fever.    Vital Signs:  Patient profile:   77 year old female Height:      67 inches Weight:      171 pounds BMI:     26.88 O2 Sat:      98 % on Room air Pulse rate:   95 / minute BP sitting:   108 / 74  (right arm) Cuff size:   regular  Vitals Entered By: Raymondo Band RN (November 19, 2010 9:43 AM)  O2 Flow:  Room air CC: 3 month follow up.  Pt staes breathing is doing well overall.  c/o PND and wheezing x few days.  Denies SOB, chest tightness, cough.   Comments Medications reviewed with patient Daytime contact number verified with patient. Raymondo Band RN  November 19, 2010 9:43 AM    Physical Exam  Additional Exam:  General: A/Ox3; pleasant and cooperative, NAD, room air sat 98%,  SKIN: no  rash, lesions NODES: no lymphadenopathy HEENT: Erhard/AT, EOM- WNL, Conjuctivae- clear,  ., PERRLA, TM-WNL, Nose-no visible polyps, Throat- clear and wnl, Mallampatii II, dentures NECK: Supple w/ fair ROM, JVD- none, normal carotid impulses w/o bruits Thyroid-  CHEST:No wheeze or rales.  HEART: irregularly irregular no m/g/r heard. HR 95 ABDOMEN: soft, nontender [id number], nl pulses,trace edema NEURO: Grossly intact to observation      Impression & Recommendations:  Problem # 1:  ALLERGY (ICD-995.3)  Allergic rhinitis minimal so far. She will continue Nasonex and can use otc claritin as needed.   Problem # 2:  EXTRINSIC ASTHMA, UNSPECIFIED (ICD-493.00) Asthma has done extremely well. Part of this concides with treatment for her cardiomypopathy and heart failure, indicating fluid overload/ cardiomyopathy was important to her wheezing dyspnea. She does  continue [employer]. We will update PFT.  Diuscussed Qvar and while she is this stable, using rescue inhaler more for exertional dypnea than for wheeze, I am inclined to wait and see before pushing use of Qvar.   Other Orders: Est. Patient Level III [id number]) Administration xolair injection [phone])  Patient Instructions: 1)  Please schedule a follow-up appointment in 4 months. 2)  Schedule PFT and 6 MWT   Medication Administration  Injection # 1:    Medication: Xolair (omalizumab) 150mg     Diagnosis: EXTRINSIC ASTHMA, UNSPECIFIED (ICD-493.00)    Route: SQ    Site: L deltoid    Exp Date: 09/2013    Lot #: [id number]    Mfr: Thedora Hinders    Comments: 1.2 ML IN RIGHT AND LEFT ARM 300 MG CHARGED [id number]    Given by: Alroy Bailiff IN [facility]ERGY LAB   Orders Added: 1)  Est. Patient Level III [id number] 2)  Administration xolair injection [id number]

## 2010-12-06 ENCOUNTER — Ambulatory Visit (INDEPENDENT_AMBULATORY_CARE_PROVIDER_SITE_OTHER): Payer: Medicare Other | Admitting: Internal Medicine

## 2010-12-06 DIAGNOSIS — J42 Unspecified chronic bronchitis: Secondary | ICD-10-CM

## 2010-12-06 DIAGNOSIS — R0602 Shortness of breath: Secondary | ICD-10-CM

## 2010-12-06 DIAGNOSIS — J449 Chronic obstructive pulmonary disease, unspecified: Secondary | ICD-10-CM

## 2010-12-06 LAB — PULMONARY FUNCTION TEST

## 2010-12-06 NOTE — Progress Notes (Signed)
PFT done today. 

## 2010-12-16 ENCOUNTER — Encounter: Payer: Self-pay | Admitting: Internal Medicine

## 2010-12-19 ENCOUNTER — Ambulatory Visit (INDEPENDENT_AMBULATORY_CARE_PROVIDER_SITE_OTHER): Payer: Medicare Other | Admitting: *Deleted

## 2010-12-19 ENCOUNTER — Encounter: Payer: Self-pay | Admitting: Cardiology

## 2010-12-19 DIAGNOSIS — I4891 Unspecified atrial fibrillation: Secondary | ICD-10-CM

## 2010-12-19 DIAGNOSIS — Z7901 Long term (current) use of anticoagulants: Secondary | ICD-10-CM

## 2010-12-19 LAB — POCT INR: INR: 2

## 2010-12-20 ENCOUNTER — Ambulatory Visit (INDEPENDENT_AMBULATORY_CARE_PROVIDER_SITE_OTHER): Payer: Medicare Other

## 2010-12-20 DIAGNOSIS — J45909 Unspecified asthma, uncomplicated: Secondary | ICD-10-CM

## 2010-12-20 MED ORDER — OMALIZUMAB 150 MG ~~LOC~~ SOLR
300.0000 mg | Freq: Once | SUBCUTANEOUS | Status: AC
  Administered 2010-12-20: 300 mg via SUBCUTANEOUS

## 2011-01-09 ENCOUNTER — Other Ambulatory Visit: Payer: Self-pay | Admitting: Cardiology

## 2011-01-10 ENCOUNTER — Other Ambulatory Visit: Payer: Self-pay

## 2011-01-10 MED ORDER — FUROSEMIDE 40 MG PO TABS: 40.0000 mg | ORAL_TABLET | Freq: Every day | ORAL | Status: DC

## 2011-01-13 ENCOUNTER — Telehealth: Payer: Self-pay | Admitting: Internal Medicine

## 2011-01-13 MED ORDER — OMALIZUMAB 150 MG ~~LOC~~ SOLR: SUBCUTANEOUS | Status: DC

## 2011-01-13 NOTE — Telephone Encounter (Signed)
Called and spoke to pharmacy and they ae aware okay to fill xolair.

## 2011-01-15 ENCOUNTER — Encounter: Payer: Self-pay | Admitting: Cardiology

## 2011-01-16 ENCOUNTER — Ambulatory Visit (INDEPENDENT_AMBULATORY_CARE_PROVIDER_SITE_OTHER): Payer: Medicare Other | Admitting: Cardiology

## 2011-01-16 ENCOUNTER — Encounter: Payer: Self-pay | Admitting: Cardiology

## 2011-01-16 ENCOUNTER — Ambulatory Visit (INDEPENDENT_AMBULATORY_CARE_PROVIDER_SITE_OTHER): Payer: Medicare Other | Admitting: *Deleted

## 2011-01-16 ENCOUNTER — Encounter: Payer: Medicare Other | Admitting: *Deleted

## 2011-01-16 VITALS — BP 115/87 | HR 79 | Ht 67.0 in | Wt 163.0 lb

## 2011-01-16 DIAGNOSIS — Z7901 Long term (current) use of anticoagulants: Secondary | ICD-10-CM

## 2011-01-16 DIAGNOSIS — I429 Cardiomyopathy, unspecified: Secondary | ICD-10-CM

## 2011-01-16 DIAGNOSIS — I4891 Unspecified atrial fibrillation: Secondary | ICD-10-CM

## 2011-01-16 DIAGNOSIS — I059 Rheumatic mitral valve disease, unspecified: Secondary | ICD-10-CM

## 2011-01-16 DIAGNOSIS — E785 Hyperlipidemia, unspecified: Secondary | ICD-10-CM

## 2011-01-16 DIAGNOSIS — N289 Disorder of kidney and ureter, unspecified: Secondary | ICD-10-CM

## 2011-01-16 NOTE — Assessment & Plan Note (Signed)
Heart rate better controlled, and tolerating Coumadin. No changes made to present regimen.

## 2011-01-16 NOTE — Assessment & Plan Note (Signed)
Recent labs reviewed, LDL 101.

## 2011-01-16 NOTE — Assessment & Plan Note (Signed)
Mild, and stable overall, creatinine 1.29.

## 2011-01-16 NOTE — Assessment & Plan Note (Signed)
Graded as moderate to severe by echocardiogram in January during hospitalization. This had progressed from her prior assessment. Hopefully this is a reflection of left ventricular systolic dysfunction complicated by rapid atrial fibrillation with annular dilatation, rather than progressive primary valvular disease. She has symptomatically improved in terms of shortness of breath. A followup 2-D echocardiogram will be obtained.

## 2011-01-16 NOTE — Patient Instructions (Signed)
Your physician recommends that you continue on your current medications as directed. Please refer to the Current Medication list given to you today. Your physician has requested that you have an echocardiogram. Echocardiography is a painless test that uses sound waves to create images of your heart. It provides your doctor with information about the size and shape of your heart and how well your heart's chambers and valves are working. This procedure takes approximately one hour. There are no restrictions for this procedure. Your physician recommends that you schedule a follow-up appointment in: 6 weeks. 

## 2011-01-16 NOTE — Progress Notes (Signed)
Clinical Summary Ms. Hamideh is a 77 y.o.female presenting for followup. She saw Ms. Lawrence for a posthospital followup in February 2012, had recent followup in the pulmonary division as well.   She reports doing reasonably well, no sense of palpitations or chest pain. Has NYHA class 2-3 dyspnea on exertion. No orthopnea or PND. Occasional mild lower extremity edema.  She reports compliance with her medications. I reviewed the specifics of her cardiac testing from earlier in the year including the implications and plan for followup. She reports tolerating Coumadin without any major bleeding problems.  She had recent lab work at [facility] showing hemoglobin 12.9, platelets 165, sodium 135, potassium 4.1, BUN 20, creatinine 1.2, AST 16, ALT 10, cholesterol 162, LDL 101, HDL 43, triglycerides 90.   Allergies  Allergen Reactions  . Clarithromycin   . Clindamycin   . Doxycycline   . [id number] Acid+Aspartame   . Penicillins     Current outpatient prescriptions:albuterol (PROAIR HFA) 108 (90 BASE) MCG/ACT inhaler, Inhale 1-2 puffs into the lungs. Every 4-6 hours as needed. , Disp: , Rfl: ;  atorvastatin (LIPITOR) 10 MG tablet, Take 10 mg by mouth daily.  , Disp: , Rfl: ;  Calcium Carbonate-Vitamin D (CALTRATE 600+D) 600-400 MG-UNIT per tablet, Take 1 tablet by mouth daily.  , Disp: , Rfl: ;  calcium-vitamin D 185 MG TABS, Take 185 mg by mouth daily.  , Disp: , Rfl:  citalopram (CELEXA) 20 MG tablet, Take 20 mg by mouth daily. , Disp: , Rfl: ;  diazepam (VALIUM) 2 MG tablet, Take 2 mg by mouth daily. , Disp: , Rfl: ;  digoxin (LANOXIN) 0.125 MG tablet, Take 125 mcg by mouth daily. , Disp: , Rfl: ;  diltiazem (CARDIZEM CD) 240 MG 24 hr capsule, Take 240 mg by mouth daily.  , Disp: , Rfl: ;  EPINEPHrine (EPI-PEN) 0.3 MG/0.3ML DEVI, Inject 0.3 mg into the muscle as needed.  , Disp: , Rfl:  esomeprazole (NEXIUM) 40 MG capsule, Take 40 mg by mouth 2 (two) times daily.  , Disp: ,  Rfl: ;  furosemide (LASIX) 40 MG tablet, Take 1 tablet (40 mg total) by mouth daily., Disp: 30 tablet, Rfl: 3;  mometasone (NASONEX) 50 MCG/ACT nasal spray, 2 sprays by Nasal route daily.  , Disp: , Rfl: ;  omalizumab (XOLAIR) 150 MG injection, 300 mg once every 28 days, Disp: 900 mg, Rfl: 3 potassium chloride SA (KLOR-CON M20) 20 MEQ tablet, Take 20 mEq by mouth daily.  , Disp: , Rfl: ;  warfarin (COUMADIN) 5 MG tablet, Take by mouth as directed.  , Disp: , Rfl:   Past Medical History  Diagnosis Date  . Asthma   . Atrial fibrillation   . COPD (chronic obstructive pulmonary disease)   . GERD (gastroesophageal reflux disease)   . Mitral valve prolapse   . Nonischemic cardiomyopathy     LVEF 25-30%  . Chronic rhinitis   . Obstructive sleep apnea   . Transudative pleural effusion   . Nasal polyps   . Mitral regurgitation     Moderate to severe 1/12    Social History Ms. Armentrout reports that she quit smoking about 30 years ago. Her smoking use included Cigarettes. She has never used smokeless tobacco. Ms. Mattia reports that she does not drink alcohol.  Review of Systems Trouble with seasonal allergies, occasional cough, watery eyes. Stable appetite. Has lost weight. No fevers or chills. Otherwise reviewed and negative.  Physical Examination Filed Vitals:   01/16/11 1257  BP: 115/87  Pulse: 79  Normally nourished appearing elderly woman in no acute distress. HEENT: Lids normal, conjunctival redness right greater than left, oropharynx clear with moist mucosa. Neck: Supple, no elevated JVP or carotid bruits, no thyromegaly. Lungs: Diminished but clear breath sounds, no wheezing or labored breathing. Cardiac: Irregularly irregular, soft apical systolic murmur, no pericardial rub. Abdomen: Soft, nontender, bowel sound present. Skin: Warm and dry. Extremities: Trace edema below the knees, distal pulses 1+. Musculoskeletal: No kyphosis. Neuropsychiatric: Alert and oriented x3, affect  appropriate.   Studies Echocardiogram 09/25/2010: - Left ventricle: The cavity size was normal. Wall thickness was     increased in a pattern of mild LVH. Systolic function was severely     reduced. The estimated ejection fraction was in the range of 25%     to 30%. Severe global hypokinesis. At least moderate diastolic     dysfunction.   - Aortic valve: There was no stenosis.   - Mitral valve: Mildly calcified annulus. Moderate to severe     regurgitation. Visually moderate by color doppler but severe by     PISA ERO, and there is systolic flow reversal in the pulmonary     vein doppler inflow pattern. Effective regurgitant orifice:     0.42cm^2 (PISA).   - Left atrium: The atrium was moderately dilated.   - Right ventricle: The cavity size was normal. Systolic function was     normal.   - Pulmonary arteries: No complete TR doppler jet so unable to     estimate PA systolic pressure.   - Systemic veins: IVC measured 1.9 cm with some respirophasic     variation, suggesting RA pressure 10 mmHg.   - Pericardium, extracardiac: Pleural effusion present.  Cardiac catheterization 09/30/2010: FINDINGS:  Central aortic pressure 122/67 with a mean of 90.  LV   pressure 124/13 with an EDP of 22.  Right atrial pressure mean of 7.  RV   pressure 39/6 with an EDP of 9.  PA pressure of 47/25 with a mean of 36.   Pulmonary capillary wedge pressure mean of 23 with V-waves to 35.  Fick   cardiac output 5.6.  Cardiac index 2.8.  Pulmonary vascular resistance   4.5 Woods units.      Left main was normal.      LAD is a long vessel that coursed to the apex and gave off two diagonal   branches.  There was mild plaquing in the midsection about 30%.      Left circumflex gave off normal ramus branch and OM-1.  The AV groove   circumflex was large and gave off the PDA and then back-filled most of   the right coronary artery.  This was angiographically normal.      Right coronary artery, we were unable  to cannulate this selectively.  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  This was not   well visualized.      Left ventriculogram done in the RAO position showed an EF of 25-30%.   There was global hypokinesis with anterior dyskinesis.  Mitral   regurgitation was not well seen.  Problem List and Plan

## 2011-01-16 NOTE — Assessment & Plan Note (Signed)
Nonischemic cardiomyopathy, LVEF had decreased by assessment in January. May be a tachycardia mediated cardiomyopathy, although certainly progressive valvular disease is to be considered in light of her mitral regurgitation. As noted, a followup 2-D echocardiogram is being obtained.

## 2011-01-20 ENCOUNTER — Ambulatory Visit (INDEPENDENT_AMBULATORY_CARE_PROVIDER_SITE_OTHER): Payer: Medicare Other

## 2011-01-20 DIAGNOSIS — J45909 Unspecified asthma, uncomplicated: Secondary | ICD-10-CM

## 2011-01-20 MED ORDER — OMALIZUMAB 150 MG ~~LOC~~ SOLR
300.0000 mg | Freq: Once | SUBCUTANEOUS | Status: AC
  Administered 2011-01-20: 300 mg via SUBCUTANEOUS

## 2011-01-21 ENCOUNTER — Encounter: Payer: Self-pay | Admitting: Cardiology

## 2011-01-21 ENCOUNTER — Ambulatory Visit (HOSPITAL_COMMUNITY)
Admission: RE | Admit: 2011-01-21 | Discharge: 2011-01-21 | Disposition: A | Discharge status: Home / Self Care | Payer: Medicare Other | Source: Ambulatory Visit | Attending: Cardiology | Admitting: Cardiology

## 2011-01-21 DIAGNOSIS — I4891 Unspecified atrial fibrillation: Secondary | ICD-10-CM | POA: Insufficient documentation

## 2011-01-21 DIAGNOSIS — I059 Rheumatic mitral valve disease, unspecified: Secondary | ICD-10-CM

## 2011-01-21 DIAGNOSIS — I428 Other cardiomyopathies: Secondary | ICD-10-CM | POA: Insufficient documentation

## 2011-01-21 NOTE — Assessment & Plan Note (Signed)
NAMEMarland Kitchen  Emily, Cunningham                CHART#:  [id number]   DATE:  06/30/2007                       DOB:  1934/06/05   PRIMARY CARE PHYSICIAN:  Girard Cooter.   PRIMARY PULMONOLOGIST:  Dr. Keturah Barre.   REASON FOR VISIT:  Discuss surveillance for Barrett's esophagus.   HISTORY OF PRESENT ILLNESS:  [name] is a 77 year old female who  presented for procedure in 2007.  Anesthesiology told her she should  never have endoscopy again.  Apparently, they felt she was an anesthesia  risk.  She does have asthma, and is being followed by Dr. Annamaria Boots.  She is  not on home O2.  She is concerned about having a colonoscopy because her  mother died at 46 from colon cancer, and her died at age 91 from colon  cancer.  None of her siblings have developed colon cancer, but her  sister did have a polyp.  Virtual colonoscopy was ordered, but she did  not want to pay the 800 dollars to have the procedure done.  She also  had a barium esophagram, which showed evidence of stricture at the level  of the fundoplication obstructing a 12.5-mm barium tablet.  She had  dilation of the esophagus just above the wrap to suggest a hiatal  hernia.  She had a tiny Zenker's diverticulum as well.  She currently  complains of having a lot of mucus for which she had nasal surgery, but  it did not help.  She has her breathing treatments.  She denies any  heartburn or pain.   PAST MEDICAL HISTORY:  1. Asthma since the age of 48.  2. Allergies.  3. Osteopenia.  4. Hyperlipidemia.   PAST SURGICAL HISTORY:  1. Nissen fundoplication [id number].  2. Appendectomy 10 years ago.  3. Nasal surgery.  4. Bilateral cataract surgery.   ALLERGIES:  1. AUGMENTIN.  2. BIAXIN.  3. CLINDAMYCIN.   MEDICATIONS:  1. Nexium 40 mg b.i.d.  2. Prednisone 5 mg b.i.d.  3. Lipitor 10 mg daily.  4. Albuterol.  5. Xolair.  6. Nasonex.  7. Eye drops.  8. Caltrate.   FAMILY HISTORY:  As part of the HPI.   SOCIAL HISTORY:  She is single, and  retired from [employer].  She quit  smoking 30 years ago.  She denies any alcohol use.   REVIEW OF SYSTEMS:  As part of the HPI, otherwise all systems are  negative.   PHYSICAL EXAMINATION:  Weight 218 pounds.  Height 5 feet 7 inches.  BMI  32.9 (obese).  Temperature 97.9.  Blood pressure 130/88.  Pulse 76.  GENERAL:  She is no apparent distress.  Alert and oriented x4.  HEENT  EXAM:  Atraumatic and normocephalic.  Pupils are equal and reactive to  light.  Mouth, no oral lesions.  Posterior pharynx with erythema or  exudate.  LUNGS:  Clear to auscultation bilaterally with prolonged  expiratory phase in the right upper lung field.  No wheezes  appreciated.CARDIOVASCULAR EXAM:  Regular rhythm.  No murmur.  Normal S1  and S2.ABDOMEN:  Bowel sounds are present.  Soft and nontender.  Non-  distended.  No rebound or guarding.  EXTREMITIES:  Without cyanosis,  clubbing, or edema.NEUROLOGIC:  She has no focal neurologic deficits.   ASSESSMENT:  [name] is a 77 year old female who  has a history of  Barrett's esophagus.  She was told by Anesthesia in [place] that she  should never have an endoscopy.  She also has two 1st degree relatives  that had colon cancer after the age of 44.  She is at slightly increased  risk for developing colon cancer, and somewhat concerned that she cannot  have endoscopy anymore.   PLAN:  1. We will order a double contrast barium enema to evaluate for      polyps.  If the barium enema is positive, then would consider      readdressing the anesthesia issue.  2. We will schedule her followup appointment after her barium enema is      complete.  3. She should continue her Nexium.       Caro Hight, M.D.  Electronically Signed     SM/MEDQ  D:  06/30/2007  T:  07/01/2007  Job:  [id number]

## 2011-01-21 NOTE — Assessment & Plan Note (Signed)
[facility]                             PULMONARY OFFICE NOTE   [name], [name]                      MRN:          [id number]  DATE:03/08/2007                            DOB:          [birth year]    PROBLEM:  1. Chronic sinusitis (Dr. Erik Obey).  2. Nasal polyposis.  3. Chronic asthmatic bronchitis with mucous plugging.  4. Mitral valve prolapse.  5. Esophageal reflux (Dr. Sammuel Cooper).   HISTORY:  She continues Xolair injections and allergy vaccine, but feels  she is not doing well. She was hospitalized briefly at [facility] with  an exacerbation of asthma and COPD around June 10.  Chest x-ray from  that showed cardiomegaly with no acute abnormality and what I have looks  like an emergency room summary.  They discharged her taking prednisone  which she tapered down and finished with none since that time.  Harsh,  nonproductive cough, sometimes some very thick, mostly white sputum.   MEDICATION:  1. Xolair 300 mg every 4 weeks.  2. Lipitor 10 mg.  3. Nasonex.  4. Advair 100/50.  5. Nexium 40 mg b.i.d.  6. Caltrate with vitamin D.  7. Lasix 40 mg with potassium used p.r.n.  8. Home nebulizer with albuterol and albuterol inhaler.   DRUG INTOLERANT:  PENICILLIN, DOXYCYCLINE, CLINDAMYCIN, AUGMENTIN.   OBJECTIVE:  Weight 208 pounds.  BP 132/82, pulse 97.  Room air  saturation 92%.  Inspiratory and expiratory wheeze.  Moderate nasal congestion.  HEART:  Rhythm is regular without murmur.  There is no edema.   IMPRESSION:  Chronic rhinitis/rhinosinusitis and asthma/chronic  obstructive pulmonary disease noting that she has repeatedly declined  opportunity to explore clean-out sinus surgery with Dr. Erik Obey.  About  all we really have left to offer, given all the things that we have  tried, is prednisone burst.   PLAN:  1. Prednisone 8 day taper from 40 mg, giving a total of 100 of the 5      mg tabs so she will have      additional in case  needed.  2. Schedule return in 1 month, earlier p.r.n.     Clinton D. Annamaria Boots, MD, [place]ade Flood, FACP  Electronically Signed    CDY/MedQ  DD: 03/08/2007  DT: 03/09/2007  Job #: [id number]

## 2011-01-21 NOTE — Assessment & Plan Note (Signed)
NAMEMarland Kitchen  [name], [name]                CHART#:  [id number]   DATE:  06/01/2008                       DOB:  07/23/1934   REFERRING PHYSICIAN:  Halford Chessman, MD   PROBLEM LIST:  1. Narrow GE junction on barium esophagogram on June 27, 2006, at      the level of her Nissen fundoplication associated with dilation of      the esophagus just above the fundoplication wrap.  2. Complete colonoscopy and upper endoscopy in 2001 with Savary      dilation to 15 mm at that time.  3. Chronic rhinosinusitis.  4. Nasal polyposis.  5. Chronic asthmatic bronchitis with mucus plugging.  6. Mitral valve prolapse.  7. Possible atypical reflux.  8. Barium enema in October 2008, which showed minimal sigmoid      diverticulosis and retained stool.  9. Tiny Zenker's diverticulum   SUBJECTIVE:  The patient is a 77 year old female who presents as a  return patient visit.  She was scheduled for evaluation for EGD and  dilation with propofol at [facility],  but her ride got sick and was unable to take her.  She did not call or  reschedule and they did not call her to reschedule.  She reports that  her swallowing is okay.  She just came in of her bronchitis.  She does  have some burning on her right side, but she thinks that is irritation  from her lungs.   MEDICATIONS:  1. Nexium 40 mg b.i.d.  2. Prednisone taper.  3. Lipitor.  4. Albuterol neb.  5. Xolair.  6. Caltrate.   OBJECTIVE:   PHYSICAL EXAMINATION:  VITAL SIGNS:  Weight 193 pounds (down 5 pounds  since May of 2009), temperature 98.5, blood pressure 118/80, and pulse  76.GENERAL:  She is in no apparent distress, alert, and oriented  x4.LUNGS:  Clear to auscultation bilaterally. CARDIOVASCULAR:  Regular  rhythm with no murmur.  Normal S1 and S2.ABDOMEN:  Bowel sounds are  present, soft, nontender, and nondistended.   ASSESSMENT:  [name] is a 77 year old female who had evidence of  narrowing of her  Nissen fundoplication in October [id number], and has not had  subsequent endoscopy because she has been told that she is high risk for  anesthesia. Thank you for allowing me to see [name] in consultation.  My recommendations follow.   RECOMMENDATIONS:  1. She should continue her Nexium.  2. If her dysphagia gets more significant, then she will call me and      she will be scheduled for her EGD with propofol at [facility].  3. She has a follow up appointment to see me in 6 months.       Caro Hight, M.D.  Electronically Signed     SM/MEDQ  D:  06/01/2008  T:  06/01/2008  Job:  [id number]   cc:   Halford Chessman, M.D.

## 2011-01-21 NOTE — Assessment & Plan Note (Signed)
[place]                             PULMONARY OFFICE NOTE   FRANCISCA, HIERRO                      MRN:          [id number]  DATE:07/16/2007                            DOB:          July 16, 1934    PROBLEMS:  1. Chronic sinusitis.  2. Nasal polyposis.  3. Chronic asthmatic bronchitis with mucus plugging.  4. Mitral valve prolapse.  5. Esophageal reflux.   HISTORY:  She has continued to do better over the last several months.  It is not at all clear why, but she does continue Xolair and has had flu  vaccine. She is using her nebulizer machine every 4 to 5 hours. There is  not much wheeze. She still feels tight in the right side of her chest  with a sensation that is relieved by the nebulizer. This has been a  persistent observation of hers. Not evident on physical exam. She has  changed her GI management from Dr. Sammuel Cooper to a Dr. Manus Rudd in  [place] because it is closer. Our concern had been esophageal reflux  aggravating her asthma. She had also been pending a colonoscopy, but  instead apparently is getting a barium enema which she is more  comfortable with from a respiratory standpoint.   MEDICATIONS:  1. Xolair 300 mg subcutaneous every 4 hours.  2. Lipitor 10 mg.  3. Nasonex.  4. Nexium 40 mg b.i.d.  5. Caltrate with vitamin D.  6. Prednisone currently 5 mg tablets, 2 daily.  7. Lasix 40 mg with potassium.  8. Home nebulizer with albuterol.  9. Albuterol rescue inhaler.   Drug intolerant PENICILLIN, DOXYCYCLINE, CLINDAMYCIN, AUGMENTIN, BIAXIN,  and ZYFLO.   OBJECTIVE:  Weight 215 pounds, blood pressure 122/72, pulse 85, room air  saturation 98%. There is very mild bilateral expiratory wheeze, mainly  in the upper lung zones. No strider. No respiratory muscle accessory  use. Moderate nasal congestion. No edema.   IMPRESSION:  1. Chronic rhinitis and rhinosinusitis with nasal polyposis noting      that she had the benefit of  evaluation with Dr. Erik Obey, but just      did not want to face surgery.  2. Asthma, moderate to severe persistent.  3. Esophageal reflux.   PLAN:  Continue present treatments. Schedule return 2 months, but  earlier p.r.n.     Clinton D. Annamaria Boots, MD, [facility], [place]  Electronically Signed    CDY/MedQ  DD: 07/17/2007  DT: 07/18/2007  Job #: [id number]   cc:   [place]

## 2011-01-21 NOTE — Assessment & Plan Note (Signed)
NAMEMarland Cunningham  AL, NOURY                CHART#:  [id number]   DATE:  01/19/2008                       DOB:  August 30, 1934   PROBLEM LIST:  1. Narrow GE junction on barium esophagogram in October of 2007, at      the level of her Nissen fundoplication associated with dilation of      the esophagus just above the wrap.  She was declined endoscopy by      anesthesia in [place].  2. Complete colonoscopy and upper endoscopy in 2001, and she had      savory dilation to 15 mm at that time.  3. Chronic rhinosinusitis.  4. Nasal polyposis.  5. Chronic asthmatic bronchitis with mucous plug.  6. Mitral valve prolapse.  7. Possibly atypical reflux disease.  8. Barium enema in October of 2008, which showed minimal sigmoid      diverticulosis and retained stool.   SUBJECTIVE:  [name] is a 77 year old female, who presents as a  return patient visit. She was last seen in December 2008. She said she  has had pneumonia, which took her two months to get over. As a result of  her illness in January and February, she lost 15 pounds. She complains  of occasional difficulty swallowing.  She burps and she feels better.  She was nauseated last week, but did not have any vomiting. She did have  dry heaves. Occasional after she eats, she gets a lot of burping and she  has had a good appetite.   MEDICATIONS:  1. Nexium 40  mg b.i.d.  2. Lipitor 10 mg daily.  3. Albuterol as needed.  4. Xolair.  5. Nasonex.  6. Caltrate.   OBJECTIVE:  Weight 198 pounds. BMI 31 (obese.)  Temperature 97. Blood  pressure 138/90. Pulse 80. In general, she is in no apparent distress  alert and oriented times four. Lungs clear to auscultation bilaterally.  Cardiovascular reveals a regular rhythm with no murmur. Abdomen: Bowel  sounds present, soft and nontender and nondistended and obese.   ASSESSMENT:  [name] is a 77 year old female, who presents with  dysphagia after a Nissen fundoplication.  She reports being told  by  anesthesia in [facility] that she should never have anesthesia because  of her respiratory problems. Thank you for allowing me to see Mrs.  Stephanie Coup in consultation.  My recommendations are to follow.   RECOMMENDATIONS:  1. She should follow a soft diet.  She should be given a hand out on a      soft diet. She is instructed not to eat steak or chicken breast.      Her meat should be ground or chopped or shredded.  She should eat      no raw vegetables or raw fruit.  2. She declined a referral to [facility] for a second opinion      regarding her need for endoscopy and esophageal dilation and her      risk of anesthesia.  3. She should continue Nexium twice a day.  4. Return patient visit in four months.       Caro Hight, M.D.  Electronically Signed     SM/MEDQ  D:  01/19/2008  T:  01/19/2008  Job:  [id number]   cc:   Tarri Fuller D. Annamaria Boots, MD, [facility], [facility]  Henderson Cloud, MD

## 2011-01-21 NOTE — Assessment & Plan Note (Signed)
[place]                             PULMONARY OFFICE NOTE   [name], [name]                      MRN:          [id number]  DATE:05/17/2007                            DOB:          03-30-1934    PROBLEM LIST:  1. Chronic sinusitis.  2. Nasal polyposis.  3. Chronic asthmatic bronchitis with mucous plugging.  4. Mitral valve prolapse.  5. Esophageal reflux.   HISTORY:  Doing much better.  Gets tight about every 6 hours when she is  due for her nebulizer treatment.  Occasionally, her pulse seems a little  rapid.  She is using prednisone at 20 mg daily and her Symbicort.  I had  discussed steroid side effects again.  I cannot be sure, but I suspect  some of her improvement may reflect compliance with this regimen.  She  continues Xolair.   OBJECTIVE:  Weight 216 pounds, BP 130/72, pulse 91, room air saturation  98%.  There is minimal hoarseness and nasal stuffiness.  CHEST:  Clear, actually, with no wheeze.  She is not labored.  Significantly overweight.  Heart sounds regular.  No adenopathy, no edema.   IMPRESSION:  Asthma/chronic obstructive pulmonary disease, currently  doing much better.  Not sure if this is a response to maintenance  steroids.  We did discuss the need to keep her stable, and need to use  the lowest effective dose of steroids.   PLAN:  She is going to try reducing prednisone using 10 mg tabs to 1-1/2  tabs (15 mg) daily, and she will continue that for the next 2 months,  scheduling return at that time.     Clinton D. Annamaria Boots, MD, [facility] Flood, [place]  Electronically Signed    CDY/MedQ  DD: 05/17/2007  DT: 05/18/2007  Job #: [id number]   cc:   Marikay Alar. Erik Obey, M.D.  Cletis Athens, M.D.  Girard Cooter, MD

## 2011-01-21 NOTE — Assessment & Plan Note (Signed)
[place]                             PULMONARY OFFICE NOTE   BIRCHIE, THUMM                      MRN:          [id number]  DATE:04/15/2007                            DOB:          1934/04/10    PROBLEMS:  1. Chronic sinusitis (Dr. Erik Obey).  2. Nasal polyposis.  3. Chronic asthmatic bronchitis with mucus plugging.  4. Mitral valve prolapse.  5. Esophageal reflux (Dr. Sammuel Cooper).   HISTORY:  She has not been using her Advair, frustrated that it does not  seem to help her much. She has had another flare of her asthma with  shortness of breath and wheezing starting a week ago and she has been  using her albuterol rather heavily. Cough is productive of thick white  phlegm and she feels congested. She does not notice fever or purulent  discharge. She says that she only feels well when she is on prednisone  and she has been off of that three weeks. She notices some swelling at  the right ankle, relative to the left, and just started herself on Lasix  yesterday. She says that mucus drools out of her mouth while she is  asleep at night, but she is not aware of any heartburn or reflux  symptoms. Overall, she is quite discouraged.   MEDICATIONS:  1. She continues Xolair injection 300 mg every 4 weeks.  2. Lipitor 10 mg.  3. Nasonex 2 puffs daily.  4. Nexium 40 mg b.i.d.  5. Caltrate with vitamin D.  6. Home nebulizer with albuterol.  7. She also has an albuterol inhaler.  8. She has a TwinJect epinephrine injector available.   She dropped off Advair as noted.   Drug intolerant PENICILLIN, DOXYCYCLINE, CLINDAMYCIN, AUGMENTIN, BIAXIN,  ZYFLO.   Allergy vaccine was discontinued in October 2007.   OBJECTIVE:  Weight 204 pounds, blood pressure 138/86, pulse 94, room air  saturation 92%. Wheeze and rhonchi bilaterally. Heart sounds are regular  without murmur. No cyanosis. No adenopathy. No evident post-nasal  drainage. There is 1+ to 2+ edema  at the right ankle, minimal at the  left. No palpable cords. Negative Homan's.   IMPRESSION:  Asthma/chronic obstructive pulmonary disease with  exacerbation, peripheral edema non-specific, but if asymmetry persists,  she will need to have Doppler of the leg as discussed with her.  Meanwhile, she is encouraged to keep her feet elevated.   PLAN:  Xopenex 1.25 mg nebulizer treatment today and Depo-Medrol 80 mg  IM with steroid talk. We are putting her back on maintenance prednisone  10 mg tablets to take 20 mg daily until she is back to see me again in a  month. Meanwhile, we are also replacing the Advair which she had given  up on, with Symbicort 160/4.5 and I have emphasized the importance of  mouth care. She will continue using her Lasix and watch response to  that, understanding that steroids will increase fluid retention.     Clinton D. Annamaria Boots, MD, [facility] Flood, [place]  Electronically Signed    CDY/MedQ  DD: 04/17/2007  DT: 04/18/2007  Job #: [id number]  cc:   Ileene Hutchinson T. Erik Obey, M.D.  Cletis Athens, M.D.  Girard Cooter, MD

## 2011-01-21 NOTE — Assessment & Plan Note (Signed)
NAMEMarland Kitchen  [name], [name]                CHART#:  [id number]   DATE:  08/25/2007                       DOB:  [birth year]   PROBLEM LIST:  1. Chronic rhinosinusitis.  2. Nasal polyposis.  3. Chronic asthmatic bronchitis, with mucous plugging.  4. Mitral valve prolapse.  5. Possible atypical reflux disease.   PRIMARY CARE PHYSICIAN:  Girard Cooter, M.D.   PRIMARY PULMONOLOGIST:  Kasandra Knudsen. Annamaria Boots, MD, FCCP, FACP.   SUBJECTIVE:  [name] is a 77 year old female who was last seen in  06/2007.  She told me that she was a high anesthesiology risk and should  not receive conscious sedation.  She is concerned about not having a  colonoscopy because her mother died of colon cancer at 79.  Her dad died  at age 29 with colon cancer.  She had a barium esophagram in 06/2006  which showed a 12.5 mm barium tablet hung up at the level of her Nissen  fundoplication.  She had also had dilation of the esophagus just above  the wrap.  She was complaining of having a lot of mucus, and had nasal  surgery, but she said it did not help.  She denied any heartburn or  indigestion.  The barium esophagram showed a barium tablet that  eventually dissolved.  She has had a discussion before about having  endoscopy with general anesthesia and has declined.  In 2001 was her  last complete colonoscopy and upper endoscopy.  She had a Savary dilator  to 15 mm.  Respiratory problems are believed to be exacerbated by  uncontrolled gastroesophageal reflux disease.   She had a barium enema, double contrast, in 06/2007, which showed  minimal sigmoid diverticulosis and retained stool.  In 2001, [name]  weighed 210 pounds.  She had a laparoscopic Nissen fundoplication in  123456.  Her reflux symptoms greatly improved.  She presents complaining  of a lot of mucus.  She said her sinuses are really bad.  She is not  having that burning sensation, water brash sensation in the back of her  throat.  She denies any heartburn or any  indigestion.  Occasionally, she  does have trouble swallowing solid foods.  She has both upper and lower  denture plates.   MEDICATIONS:  1. Nexium 40 mg b.i.d.  2. Prednisone 5 mg b.i.d.  3. Lipitor 10 mg daily.  4. Albuterol b.i.d.  5. Xolair.  6. Nasonex.  7. Eye drops.  8. Caltrate.   OBJECTIVE:  VITAL SIGNS:  Weight 213 pounds (down 3 pounds since  05/2007), height 5 feet 7 inches, BMI 33.4 (obese), temperature 98.1,  blood pressure 122/84, pulse 80.  GENERAL:  She is in no apparent distress.  Alert and oriented x4.  HEENT:  Atraumatic, normocephalic.LUNGS:  She has good air movement  bilaterally in the upper and lower bases in the posterior lung fields.  She does appear to have end-expiratory wheezes which seem to be  generated in her throat.CARDIOVASCULAR:  Regular rhythm.  No  murmur.ABDOMEN:  Bowel sounds are soft.  Nontender, nondistended.  No  rebound or guarding.  Obese.   ASSESSMENT:  [name] is a 77 year old female who continues to have  respiratory problems.  She has had a Nissen fundoplication in 123456, and  she has a barium esophagram in 2007 which shows  a narrowed GE junction,  with dilation of the esophagus above that, suggesting hiatal hernia.  The likelihood that acid reflux is contributing to her current  respiratory problems is low, and due to the fact that she has anatomic  narrowing at her GE junction and uncontrolled reflux cannot coexist.  She is also on maximum acid suppression with Nexium 40 mg b.i.d., which  should be sufficient and is not a candidate for endoscopy due to her  respiratory status unless the benefits outweigh the risks. Thank you for  allowing me to see [name] in consultation.  My recommendations  follow.   RECOMMENDATIONS:  1. She should have a double-contrast barium enema every 5 years.      Could consider endoscopy should be performed at a tertiary care      center due to her high anesthesiology risk if needed.  2. Would  consider a 24-hour pH probe on Nexium to determine whether or      not she actually has abnormal acid exposure in her esophagus.      However, The 24-hour pH probe cannot be performed at [facility] or [facility].  We only do pH monitoring by      Bravo study.  3. Follow-up appointment in 4 months.  We will speak with Dr. [name] in regards to [name]'s care.       Caro Hight, M.D.  Electronically Signed     SM/MEDQ  D:  08/25/2007  T:  08/26/2007  Job:  [id number]   cc:   Tarri Fuller D. Annamaria Boots, MD, FCCP, FACP  Girard Cooter, MD

## 2011-01-24 ENCOUNTER — Other Ambulatory Visit: Payer: Self-pay | Admitting: *Deleted

## 2011-01-24 MED ORDER — WARFARIN SODIUM 5 MG PO TABS: ORAL_TABLET | ORAL | Status: DC

## 2011-01-24 NOTE — Assessment & Plan Note (Signed)
[place]                             PULMONARY OFFICE NOTE   BLESSIN, GEERTS                      MRN:          [id number]  DATE:11/03/2006                            DOB:          24-Apr-1934    PROBLEM LIST:  1. Chronic sinusitis.  2. Nasal polyposis.  3. Chronic asthmatic bronchitis with mucus plugging.  4. Mitral valve prolapse.  5. Esophageal reflux.   HISTORY:  She has continued Xolair and her allergy vaccine.  We had  called in a prednisone taper at the end of January, when family reported  she was having more trouble.  She says currently she is less congested  in the head.  Decongestants have helped.  Her chest never feels clear.  And, she just does not feel that she can cough up much of anything.  There is no palpitation, fever, purulent or bloody discharge,  adenopathy, or ankle edema.   MEDICATIONS:  1. Xolair 300 mg every 4 weeks.  2. Lipitor 10 mg.  3. Nasonex 2 puffs daily.  4. Advair 100/50 b.i.d.  5. Nexium 40 mg b.i.d.  6. Caltrate with vitamin D.  7. P.r.n. use of Lasix 40 mg with potassium.  8. Home nebulizer with albuterol.  9. Albuterol rescue inhaler.  10.She has a twinject epinephrine injector that she has never needed      to use.   DRUG INTOLERANT TO:  1. PENICILLIN.  2. DOXYCYCLINE.  3. CLINDAMYCIN.  4. AUGMENTIN.  5. BIAXIN.  6. ZYFLO.   OBJECTIVE:  VITAL SIGNS:  Weight was not recorded.  BP 122/64, pulse  regular 87, room air saturation 96%.  LUNGS:  Wheezy congestion bilaterally.  HEART:  Sounds regular without murmur.  HEENT:  There is no stridor or postnasal drainage.  Minimal stuffiness  and turbinate edema.  EXTREMITIES:  No cyanosis, clubbing, or edema.   IMPRESSION:  1. Chronic rhinitis and rhinosinusitis.  2. Chronic asthma with a significant fixed chronic obstructive      pulmonary disease component.   PLAN:  1. Prednisone 40 mg x1 week, 30 mg x1 week, 20 mg x1 week, then  maintenance 10 mg daily.  2. Schedule return one month.     Clinton D. Annamaria Boots, MD, [facility], [place]  Electronically Signed    CDY/MedQ  DD: 11/07/2006  DT: 11/07/2006  Job #: [id number]   cc:   Marikay Alar. Erik Obey, M.D.  Cletis Athens, M.D.  [facility]

## 2011-01-24 NOTE — Discharge Summary (Signed)
[place]  Patient:    [name], [name] Visit Number: [id number] MRN: [id number]          Service Type: MED Location: [phone] Attending Physician:  Lolita Lenz Dictated by:   Mack Hook, M.D. Admit Date:  11/27/2001 Discharge Date: 12/03/2001                             Discharge Summary  ADMISSION DIAGNOSES: 1. Asthma exacerbation. 2. Sinusitis. 3. Gastroesophageal reflux. 4. Allergic rhinitis.  DISCHARGE DIAGNOSES: 1. Asthma exacerbation. 2. Sinusitis. 3. Gastroesophageal reflux. 4. Allergic rhinitis. 5. Question of left lower lobe pneumonia.  HISTORY OF PRESENT ILLNESS:  Ms. Redbird is a 77 year old female who was admitted November 27, 2001, by Adriana Reams, M.D., with a history of progressive shortness of breath, cough, and low-grade fevers over approximately a week.  Over the two days preceding admission she had increasing dyspnea, sinus pressure, and cough.  The patient was seen at [facility] walk-in clinic and was felt to need admission.  PHYSICAL EXAMINATION:  On her admission exam, temperature was 98.7, respiratory rate 20.   Patient with rhonchorous air sounds bilateral with some mild wheezing.  LABORATORY DATA:  Chest x-ray showed the possibility of a left lower lobe infiltrate, though in comparison to old x-rays this appeared somewhat chronic.  White blood cell count was 8, hemoglobin 12.6.  ABGs showed a PO2 of 54 and a pH of 7.46, PCO2 of 37.1.  HOSPITAL COURSE:  The patient was admitted, placed on Tequin IV, corticosteroids, frequent nebulization treatments, and oxygen.  Her medications for GERD and allergic rhinitis were continued.  The patient also was initiated on Tequin p.o.  #1 -  ASTHMA EXACERBATION WITH POSSIBLE LEFT LOWER LOBE PNEUMONIA:  The patient improved slowly throughout the week and day of discharge has just slight expiratory wheezing, mainly with just upper airway noises at  this time. She subjectively feels much better.  Her saturations remain in the low to mid-90s on room air at this time.  She feels that she can get up and move around without difficulty.  #2 -  SINUSITIS:  Symptoms essentially resolved with Tequin, decongestants, and antihistamines.  #3 -  GASTROESOPHAGEAL REFLUX DISEASE:  Stable on Nexium.  PATIENT PLANS:  The plan is for the patient to undergo a Nissen fundoplication with Edsel Petrin. Dalbert Batman, M.D., a week from day of discharge.  She has a preop evaluation in three days.  She does have a follow-up appointment with Clinton D. Annamaria Boots, M.D., her pulmonologist and allergist, next Wednesday, two days prior to the surgery.  Dr. Dalbert Batman is currently out of town this week, and I will inform him of her hospitalization as soon as possible on his return. The patient is thus discharged in stable condition.  DISCHARGE MEDICATIONS: 1. Allegra D 60 mg p.o. b.i.d. or Zyrtec 10 mg daily, whichever she has at    home. 2. Nasonex two sprays each nostril once daily. 3. Tequin 400 mg once daily or seven more days, a total of 14-day course. 4. Albuterol nebulizers four times daily. 5. Nexium 40 mg daily. 6. Reglan 10 mg q.a.c. and h.s. 7. Advair 250/50 one inhalation twice daily, to rinse mouth afterwards. 8. Humibid LA one p.o. b.i.d. 9. Prednisone 40 mg today and then taper by one-half tablet daily until off.  FOLLOW-UP:  She has an appointment for follow-up with me in mid-April, to keep that  appointment and to keep her appointments next week.  Hopefully, she will be able to go through with the Nissen fundoplication, as this has been put off now at least three times. 1. Dictated by:   Mack Hook, M.D. Attending Physician:  Lolita Lenz DD:  12/03/01 TD:  12/04/01 Job: [id number]

## 2011-01-24 NOTE — Op Note (Signed)
   NAME:  [name], [name]                         ACCOUNT NO.:  [id number]   MEDICAL RECORD NO.:  [id number]                   PATIENT TYPE:  INP   LOCATION:  5707                                 FACILITY:  [facility]   PHYSICIAN:  Clinton D. Annamaria Boots, M.D.              DATE OF BIRTH:  July 07, 1934   DATE OF PROCEDURE:  05/23/2002  DATE OF DISCHARGE:                                 OPERATIVE REPORT   PROCEDURE:  Bronchoscopy.   PULMONOLOGIST:  Kasandra Knudsen. Annamaria Boots, M.D.   INDICATIONS FOR PROCEDURE:  [age]-year-old woman with bilateral upper  lung zone atelectasis, congestive cough, no dominant mass on CT scan and  poor response thus far to bronchodilators, steroids and antibiotics.  Bronchoscopy is performed for endobronchial evaluation.   DESCRIPTION OF PROCEDURE:  After fully informed consent, bronchoscopy was  performed on an inpatient basis in the endoscopy suite.  Premedication was  with Demerol and Atropine.  The upper airway was anesthetized topically with  Cetacaine spray and 1% Xylocaine.  A cumulative dose of 4 mg intravenous  Versed was required for additional sedation and cough control.  Oxygen was  provided at 12 liters per minute by nasal prong g achieving saturations  between 86-92%.  Cardiac monitor showed sinus rhythm.  An Olympus fiberoptic  bronchoscope was introduced via the left nostril to the level of the vocal  cords without difficulty.  The airway was small.  The cords moved normally.  Cough was moderate.  The trachea and main carina were unremarkable.  Sequential examination of each lobar and segmental airway bilaterally to the  fourth division level revealed a very mild bronchitis with little  inflammation and endobronchial mass.  There were thick ropes of elastic  clear blue-like mucous inspissated especially in the upper lung zone  bronchi.  Attempts were made to suction the right upper lobe clear with  moderate success.  A brush and then a standard biopsy forceps  were  introduced for limited transbronchial lung biopsy by standard technique from  the right upper lobe with fluoroscopic guidance.  There were no apparent  complications.  She is being held until stable and then will return to her  hospital room.   FINAL IMPRESSION:  Asthmatic bronchitis with retained secretions.                                               Clinton D. Annamaria Boots, M.D.    CDY/MEDQ  D:  05/23/2002  T:  05/23/2002  Job:  [id number]

## 2011-01-24 NOTE — H&P (Signed)
NAME:  [name], [name]                         ACCOUNT NO.:  [id number]   MEDICAL RECORD NO.:  [id number]                   PATIENT TYPE:  INP   LOCATION:  5707                                 FACILITY:  [facility]   PHYSICIAN:  Clinton D. Annamaria Boots, M.D.              DATE OF BIRTH:  1934-04-27   DATE OF ADMISSION:  05/19/2002  DATE OF DISCHARGE:                                HISTORY & PHYSICAL   ADMISSION DIAGNOSES:  1. Community acquired pneumonia.  2. Hypoxemia.  3. Chronic obstructive pulmonary disease with acute exacerbation.  4. Gastroesophageal reflux disease.   HISTORY:  The patient is a 77 year old white female with a history of  recurrent bronchitis over the last three years. She was seen in the office  on May 16, 2002, complaining of shortness of breath, chest tightness,  congestive cough with white sputum, no fever or pain. She was begun on  Zithromax  and given Depo-Medrol 80 IM and a bronchodilator nebulizer  treatment. She returns now three days later through the office, saying that  she is coughing up soapsuds and having  some chilling without sweats,  complaining of pleuritic pain over the right scapula, clearly related to  deep breaths and movement. No blood or purulent secretions.   A chest x-ray in the office revealed a distinct right hilar  opacity/infiltrate, new compared with our film of April 25, 2002. No  effusion. Left ventricular contour prominent. This is consistent with a  perihilar pneumonia. Because of her hypoxemia with saturation 86% on room  air, her dyspnea and the acute pneumonia, she  is admitted for  stabilization.   REVIEW OF SYSTEMS:  Anxious and tearful.  Dysphagia present after recent  hiatal hernia repair  is now much improved. No obvious reflux. No nasal  discharge or recent headache or stiff neck. No adenopathy, rash or blood. No  anterior or exertional chest pain and no pleuritic chest pain other  than as  described over the right  scapula. Dyspneic at rest. No nausea or vomiting or  change in bowel or bladder habits. No calf pain, tightness or ankle edema.  No syncopal episode.   PAST MEDICAL HISTORY:  1. Recurrent bronchitis.  2. Polypoid pansinusitis with sinus surgery by Dr. Erik Obey.  3. Gastroesophageal reflux disease with Barrett's esophagus.  4. Laparoscopic hiatal hernia repair in May 2003 by Dr. Dalbert Batman.  5. Hospitalized in 2000 with pneumonia.  6. Hyperlipidemia.  7. Anxiety and depression.  8. Surgery for appendectomy.  9. Allergic rhinitis with positive allergy skin test but not able to stay on     allergy vaccine.   ALLERGIES:  1. CLINDAMYCIN.  2. BIAXIN.  3. AUGMENTIN (RASH).   MEDICATIONS:  Home medications:  1. Albuterol nebulized.  2. Foradil 1 inhalation b.i.d.  3. Pulmicort 2 puffs b.i.d.  4. Reglan 5 mg a.c. and q.h.s.  5. Nexium 40 mg b.i.d.  6. Nasonex  nasal spray.   SOCIAL HISTORY:  Not married, no children. Retired from [employer]. Quit smoking cigarettes, one pack per day in 1980 after 20 years. No  ethanol.   FAMILY HISTORY:  Father died of colon cancer, mother died of breast cancer,  brother died of lung cancer and other brother has had a stroke. One sister  had lupus related renal disease.   PHYSICAL EXAMINATION:  VITAL SIGNS:  Temperature 99.6, blood pressure  112/51, pulse regular and 87, room O2 saturation was 86%.  O2 saturation in  the hospital on 4 liters nasal prongs 90%.  GENERAL:  Weight 190 pounds, down from 213 pounds in May 2003.  Somewhat  anxious and tearful but easily reassured. Moderately obese, white female,  pleasant affect, speech clear. Fully alert and oriented. Ambulatory.  SKIN:  No rash  or bruising.  LYMPH:  No adenopathy found.  HEENT:  Oral mucosa clear.  No stridor, post nasal drainage. Conjunctivae  clear. Gross vision intact.  Pupils reactive.  NECK:  No neck vein distention. Thyroid not enlarged.  CHEST:  Bilateral rhonchi,  loudest over the right upper chest. No wheeze,  mild tachypnea but unlabored, no rub or dullness.  BREASTS:  Not examined.  HEART:  Regular rhythm, normal S1, S2, no murmur or  gallop.  ABDOMEN:  Soft, obese, nontender, bowel sounds present, no enlarged liver or  spleen.  PELVIC AND RECTAL:  Not examined.  EXTREMITIES:  No cyanosis, clubbing or edema. Calves are soft, no tenderness  or cords. Negative Homan's.  No tremor.   IMPRESSION:  This appears to be an acute community acquired pneumonia,  perhaps partly treated by Zithromax, begun three days ago, and picture  blunted by Depo-Medrol given three days ago.  Some concern that even on  May 16, 2002, she was describing white sputum.  The chest x-ray picture  is distinctly perihilar and distinctly new since August, making pneumonia  far and away the most likely explanation.  Because of the degree of  hypoxemia, I am concerned there may be progression.  I doubt pulmonary  embolism from this picture and lack of  previous history of DVT or PE.  We will utilize Lovenox DVT prophylaxis  while  here. I doubt a new aspiration event, but we will continue her on her  acid reflux therapy and watch her response to Tequin. We are somewhat  constrained by her antibiotic sensitivities.                                               Clinton D. Annamaria Boots, M.D.    CDY/MEDQ  D:  05/19/2002  T:  05/21/2002  Job:  [id number]   cc:   Marcelino Duster, M.D.  [place]  [place] [address]  Fax: [phone]

## 2011-01-24 NOTE — H&P (Signed)
NAME:  [name], [name] NO.:  [id number]   MEDICAL RECORD NO.:  [id number]                   PATIENT TYPE:   LOCATION:                                       FACILITY:   PHYSICIAN:  Delanna Ahmadi, M.D.               DATE OF BIRTH:   DATE OF ADMISSION:  DATE OF DISCHARGE:                                HISTORY & PHYSICAL   CHIEF COMPLAINT:  Rectal bleeding.   HISTORY OF PRESENT ILLNESS:  The patient is a 77 year-old white female with  a history of COPD, GERD with Barrett's esophagus, chronic sinusitis and  allergic rhinitis who was recently admitted for sixteen days by Dr. Keturah Barre with pneumonia and a COPD exacerbation.  She was discharged home  yesterday.  While in the hospital she had significant problems with  constipation, straining with bowel movements and painful hemorrhoids.  After  getting home yesterday afternoon she developed bright red blood per rectum  and has had multiple episodes since.  She has not moved any stool.  Her  rectal area has been very sore.  She denies any fevers, chills or abdominal  pain.   PAST MEDICAL HISTORY:  1. Admitted on September 11 for pneumonia and COPD exacerbation.  CT scan of     the chest showed no pulmonary embolus.  Bronchoscopy showed retained     secretions but nothing else abnormal on September 25.  An echocardiogram     showed normal left ventricular function.  2. Gastroesophageal reflux disease with Barrett's esophagitis.  3. Chronic sinusitis and allergic rhinitis.  4. Mitral valve prolapse.  5. Anxiety and depression.   PAST SURGICAL HISTORY:  1. Laparoscopic Nissen fundoplication by Dr. Dalbert Batman in May 2003.  2. Appendectomy.  3. Sinus surgery.   ALLERGIES:  CLINDAMYCIN, BIAXIN and AUGMENTIN - all rash.   MEDICATIONS:  1. Nexium 40 mg p.o. b.i.d.  2. Albuterol 2.5 mg q.i.d. p.r.n.  3. Nasonex two sprays each nostril q.d.  4. Theophylline 200 mg b.i.d. with meals.  5. Colace 100 mg  b.i.d.  6. Prednisone taper, 10 mg q.i.d. times two days, 10 mg t.i.d. times two     days, 10 mg b.i.d. times two days and then 10 mg q.d. times two days.  7. Tranxene 3.75 mg q.d. p.r.n.  8. Darvocet-N 100 one p.o. q.6h p.r.n. pain.   FAMILY HISTORY:  Noncontributory.   SOCIAL HISTORY:  She has never married.  She lives alone in [place].  Her  primary physician is Dr. Daralene Milch.  She has not smoked for years.  No  alcohol.   REVIEW OF SYMPTOMS:  Otherwise negative.   PHYSICAL EXAMINATION:  GENERAL:  Comfortable appearing female who is lying  supine.  VITAL SIGNS:  Blood pressure is 136/70, heart rate 81, respirations 18,  temperature 98.7.  Orthostatic vital signs:  Blood pressure 134/58 with a  heart  rate of 80 supine; blood pressure 144/50 with a heart rate of 84  standing.  HEENT:  Pupils equal, round and respond to light.  Extraocular muscles are  intact.  Ears:  Tympanic membranes are clear.  Oropharynx shows some  yellowish exudate on the right posterior palate.  The neck is supple with no  lymphadenopathy.  No thyromegaly.  No nodules.  No elevated jugular venous  pressure.  LUNGS:  Bilateral expiratory wheezes and rhonchi.  HEART:  Regular rate and rhythm without murmurs, rubs or gallops.  ABDOMEN:  Soft and non-tender with normal bowel sounds.  No mass or  hepatosplenomegaly.  RECTAL:  Normal tone.  Skin tag externally.  External anus is sore. Internal  exam shows severe tenderness at 12:00 on the anus.  No obvious mass or  hemorrhoid.  There is bright red blood per rectum and soft stool impacted in  the rectum.  EXTREMITIES:  No cyanosis, clubbing or edema.  NEUROLOGIC:  Nonfocal.   LABORATORY DATA:  CBC:  WBC 11.7, hemoglobin 9.4, platelet count 156,000.  INR 0.9.  Chemistries:  Sodium 135, potassium 4.2, chloride 104, bicarbonate  21, BUN 19, creatinine 1.0, blood sugar 85.   ASSESSMENT:  1. Probable anorectal bleeding, suspect hemorrhoid or anorectal fissure.      Rule out more proximal source such as diverticular bleed.  2. Soft fecal impaction.  3. The patient has a chronic obstructive pulmonary disease exacerbation and     pneumonia.  4. History of gastroesophageal reflux disease and Barrett's esophagitis.  5. Oral candidiasis.   PLAN:  Admit, follow hemoglobin, sitz baths, Anusol-HC suppository, MiraLax  and Colace.  Consider enemas when the patient is  more comfortable.  Nystatin swish and swallow q.i.d.                                               Delanna Ahmadi, M.D.    JJG/MEDQ  D:  06/05/2002  T:  06/07/2002  Job:  [id number]   cc:   Marcelino Duster, M.D.  [place]  [place] [address]  Fax: [phone]

## 2011-01-24 NOTE — Op Note (Signed)
[facility]  Patient:    [name], [name] Visit Number: [id number] MRN: [id number]          Service Type: SUR Location: [facility] Attending Physician:  Barbera Setters Dictated by:   Edsel Petrin. Dalbert Batman, M.D. Proc. Date: 01/24/02 Admit Date:  01/24/2002   CC:         Clinton D. Annamaria Boots, M.D.  Alyson Locket. Sammuel Cooper, M.D.  Mack Hook, M.D.   Operative Report  PREOPERATIVE DIAGNOSIS:  Gastroesophageal reflux disease.  POSTOPERATIVE DIAGNOSIS:  Gastroesophageal reflux disease.  OPERATION PERFORMED:  Laparoscopic Nissen fundoplication.  SURGEON:  Edsel Petrin. Dalbert Batman, M.D.  FIRST ASSISTANT:  Sammuel Hines. Daiva Nakayama, M.D.  OPERATIVE INDICATION:  This is a 77 year old white female, who has had heartburn for many years.  This has getting worse with free reflux and regurgitation when she is supine, coughing, hoarseness, and wheezing.  She has asthma, and that has been getting worse.  She has had to be hospitalized about three times over the past two years for pneumonia and exacerbations of her bronchitis.  It is thought that this is being exacerbated by her reflux. Upper GI shows a 4 cm hiatal hernia and free reflux.  Upper endoscopy two years ago showed a mild distal stricture of the esophagus with Barretts esophagus but no dysplasia.  She has had sinus surgery by Dr. Erik Obey, and recovered from that.  She has had manometry which looks fine.  The lower esophageal sphincter pressure was slightly low.  Her esophageal peristalsis is normal.  Her gallbladder ultrasound is normal.  Because of her Barretts esophagus, symptomatic reflux, and exacerbation of her asthmatic bronchitis, she is brought to the operating room electively for surgery.  OPERATIVE TECHNIQUE:  Following the induction of general endotracheal anesthesia, the patients abdomen was prepped and draped in a sterile fashion. A 2.0 cm long vertically-oriented midline incision was made about 3  cm above the umbilicus.  The fascia was incised in the midline and the abdominal cavity entered under direct vision.  A 10 mm Hasson trocar was inserted and secured with a pursestring suture of 0 Vicryl.  Pneumoperitoneum was created.  We had good visualization of the liver and stomach.  The gallbladder and liver looked normal.  There was no evidence of hypertrophy of the liver.  The spleen was normal size and color. The stomach looked normal.  There was very slight enlargement of the hiatus and sliding hiatal hernia which was easily reduced. The small bowel and large bowel looked normal.  We placed a 10 mm trocar in the right upper quadrant, a 10 mm trocar in the left upper quadrant, and a 10 mm trocar in the left subcostal region in the anterior axillary line and a 5 mm trocar in the epigastrium.  A 5 mm retractor was used to hold up the left lobe of the liver.  We used the harmonic scalpel to take down the gastrohepatic omentum.  We dissected all of the areolar tissue away from the anterior surface of the esophagus.  We dissected the right crus away from the esophagus.  We identified the left crus from the right side, and then we continued over the left side and continued to dissect the left crus away from the esophagus.  We took all of the short gastric vessels down using the harmonic scalpel.  We carefully mobilized the fundus off of the left crus until it was completely mobilized off of the left crus. We had one little bleeding  area from one of the short gastric vessels, but this was controlled with metal clips and the harmonic scalpel and, at the end of the case, it was completely dry.  Once we were satisfied with our anatomy, we checked the position of the esophagus.  We identified the angle of His.  As stated above, we completely cleaned the fundus of the stomach off of the left crus.  The crus was closed with two interrupted sutures of 0 Surgidac.  We then passed a 40 [place]  lighted Bougie down beside the 18 French nasogastric tube. We brought a suitable piece of fundus from the left side around the back of the esophagus to the right side and identified a suitable piece of fundus on the left side and found that we could make a very loose wrap without any tension whatsoever.  Fundoplication was created with three interrupted sutures of 0 Surgidac.  A generous bite of the fundus on the left, anterior wall of the esophagus, and fundus on the right was taken, and then each of these sutures were then tied and marked with metal clips.  All three sutures were placed 1 cm apart from each other so that we had a very loose 2.0 cm wrap.  We took photographs of the crural closure and a photograph of the completed fundoplication.  We checked for bleeding.  There was no bleeding from any of the areas of dissection, spleen, or liver.  We irrigated this out.  All of the dilators and nasogastric tubes were removed.  The trocars were removed under direct vision, and there was no bleeding from the trocar sites.  The pneumoperitoneum was released.  The fascia above the umbilicus was closed with 0 Vicryl sutures.  All of the skin incisions were closed with skin staples and Steri-Strips.  Clean bandages were placed, and the patient was taken to the recovery room in stable condition.  Estimated blood loss was about 30 cc. Complications none.  Sponge and instrument counts were correct. Dictated by:   Edsel Petrin. Dalbert Batman, M.D. Attending Physician:  Barbera Setters DD:  01/24/02 TD:  01/24/02 Job: [id number]

## 2011-01-24 NOTE — H&P (Signed)
[place]  Patient:    [name], [name] Visit Number: [id number] MRN: [id number]          Service Type: MED Location: [id number] Attending Physician:  Lolita Lenz Dictated by:   Adriana Reams, M.D. Admit Date:  11/27/2001   CC:         [name], M.D.  [name]. [name], M.D.   History and Physical  DATE OF BIRTH:  [birth year]  PRIMARY PHYSICIAN:  Dr. Mack [facility], [facility] at [name].  PROBLEM LIST: 1. Shortness of breath.    a. Low-grade fever.    b. Hypoxia.    c. History of asthma and prior history of pneumonia in 2001 requiring       hospitalization. 2. Sinusitis.    a. History of sinus problems, long-standing, followed by       Dr. Ileene Hutchinson T. Cunningham, ear, nose and throat surgeon.  Patient had CT       sinuses for followup this past Monday, results not known at this time. 3. Esophageal reflux disease with Barretts esophagus dysplasia.    a. Confirmed by esophagogastroduodenoscopy by Dr. Alyson Cunningham. [name] in       2001.    b. Patient scheduled for Nissen fundoplication surgery by       Dr. Edsel Cunningham. [place] in two weeks. 4. History of asthma with no flare-ups requiring hospitalization. 5. Seasonal allergies. 6. History of mitral valve prolapse, asymptomatic.  HISTORY OF PRESENT ILLNESS:  [name] is a pleasant 77 year old white female, with history as above, who presented to our [name] at [name] with a history of progressive shortness of breath, cough and low-grade fevers that started on Monday.  Over the past couple of days, she has had increasing dyspnea, some sinus pressure and facial pressure and the continued cough.  This morning, prior to going to the clinic, she has worsening dyspnea.  While at the walk-in clinic, she did receive a treatment with nebulizers with albuterol and a chest x-ray at that time did not show any evidence of pneumonia but did  show some blunting at the costovertebral angles but no infiltrates.  The x-ray was consistent with COPD.  She was sent over to the [facility] for admission because of her increased work of breathing, her history of asthma and COPD by chest x-ray and only moderate responsiveness to outpatient therapy.  CURRENT MEDICATIONS: 1. Nexium 40 mg p.o. q.d. 2. Reglan 10 mg p.o. q.i.d. 3. Advair Diskus one puff twice a day. 4. Albuterol in measured-dose inhaler p.r.n. 5. Zyrtec 10 mg p.o. b.i.d.  ALLERGIES:  She is allergic to [name], [name] and [name].  PAST MEDICAL HISTORY:  As per her problem list.  PAST SURGICAL HISTORY:  Status post appendectomy in 1997, status post sinus surgery per Dr. Erik Cunningham in [id number].  SOCIAL HISTORY:  She lives alone in [name].  She is widowed.  She is retired from [name].  She has family in the area.  She quit smoking 25 years ago. Denies any alcohol use.  REVIEW OF SYSTEMS:  Negative for vomiting but she has had mild nausea, headache, congestion, fever, shortness of breath and sinus pressure.  She denies any chest pain.  No weight loss or weight gain.  No constipation.  No blood per stool.  PHYSICAL EXAMINATION:  VITAL SIGNS:  Temperature 98.7, BP 116/61, pulse 101, respiratory rate is 20.  GENERAL:  She is well-appearing.  No acute shortness  of breath is noted.  She is speaking in full sentences, no increased work of breathing.  No use of accessory muscles.  HEENT:  Her oropharynx is mildly erythematous but there is no exudate present. She does have marked sinus tenderness to palpation over her maxillary and frontal sinuses as well as some periorbital erythema and edema.  NECK:  Supple.  There is no lymphadenopathy present.  LUNGS:  Her lungs have rhonchorous air sounds bilaterally with some mild wheezing in her upper airways; she is moving air.  CARDIOVASCULAR:  Regular rhythm.  Normal S1 and S2.  No murmur is appreciated.  ABDOMEN:  Soft,  nontender.  No rebound.  No guarding.  She does have a small appendectomy scar.  EXTREMITIES:  Warm with 2+ peripheral pulses.  There is no peripheral edema that is noted.  NEUROLOGIC:  Exam is nonfocal.  She is alert and oriented.  SKIN:  Multiple seborrheic keratoses on her back.  LABORATORY VALUES:  Her CBC, CMET and a blood gas are pending.  PA and lateral chest x-ray has been ordered here and is pending.  ASSESSMENT AND PLAN: 1. Sinusitis:  Given her history of fever, facial pain, prior history of sinus    problems, suspect that this is the underlying diagnosis which has triggered    her asthma.  We will cover her with broad-spectrum antibiotics with p.o.    Tequin and be aggressive with nasal decongestant including oral and topical    nasal steroids.  We should get the CT results from [name] office to    review those to see if she has had any worsening of her sinus symptoms as    far as air-fluid level or extension of sinus disease over the past year. 2. Asthma exacerbation:  This is probably triggered by the sinus infection.  We will be aggressive with her pulmonary toilet and treat with albuterol    and Atrovent nebulizers every 6 hours initially for the first 24 hours.    She is going to start on intravenous steroids as well, Solu-Medrol 60 mg    q.8h. for 24 hours, 2 L of nasal cannula oxygen.  She should have a peak    expiratory flow rate checked twice a day, pre and post treatment.  We will    get a baseline blood gas also. 3. Gastroesophageal reflux disease with Barretts dysplasia:  This has been    esophagogastroduodenoscopy-confirmed by patient, but Dr. Sammuel Cunningham did it in    2001.  She is scheduled for a Nissen fundoplication surgery with    Dr. Edsel Cunningham. [name].  I do not feel that this current hospitalization    should interfere with that, if she resolves quickly enough to be able to     get home.  For now, we will cover her with b.i.d. Protonix and  continue her    Reglan that she has been taking at home.  Regular diet. 4. Allergic rhinitis:  We will continue with the antihistamine but will change    to Allegra-D b.i.d. or whatever equivalent the pharmacy wishes to choose    that is on formulary. Dictated by:   Adriana Reams, M.D. Attending Physician:  Lolita Lenz DD:  11/27/01 TD:  11/28/01 Job: [id number]

## 2011-01-24 NOTE — Assessment & Plan Note (Signed)
[place]                               PULMONARY OFFICE NOTE   Emily, Cunningham                      MRN:          [id number]  DATE:04/21/2006                            DOB:          [birth year]    PROBLEM:  1. Chronic rhinosinusitis.  2. Nasal polyposis.  3. Chronic asthmatic bronchitis with mucus plugging.  4. Mitral valve prolapse.   HISTORY:  She continues monthly injection of Xolair and weekly allergy  vaccine injections.  She has continued to feel congested through this  summer, never able to really cough herself clear.  Usually cough is  nonproductive but deep and rattling.  Sputum is usually thick and white.  She denies syncope, headache, chest pain or palpitation.  She asked for a  prednisone taper today which we discussed.   MEDICATION:  1. Xolair 300 mg.  2. Lipitor 10 mg.  3. Nasonex two puffs daily.  4. Advair 100/50.  5. Nexium 40 mg b.i.d.  6. Tranxene 7.5 mg p.r.n.  7. Lasix 40 mg.  8. Potassium.  9. Allergy vaccine.  10.Home nebulizer with albuterol solution or uses metered albuterol      inhaler.  11.Mucinex.   DRUG INTOLERANCES:  1. PENICILLIN.  2. DOXYCYCLINE.  3. CLINDAMYCIN.  4. AUGMENTIN.  5. BIAXIN.   OBJECTIVE:  Weight 265 pounds, blood pressure 112/76, pulse regular 88, room  air saturation 93%.  Coarse, deeply congested rhonchi, worker breathing is  not increased at rest and she does not clear secretions despite the rattle.  Heart sounds regular without murmur or gallop.  I question increased P2.  There is no neck vein distention, edema or stridor.   IMPRESSION:  1. Asthmatic bronchitis with chronic obstructive pulmonary disease.  2. Esophageal reflux.  3. Rhinitis.  4. Retained throat secretions.   PLAN:  1. Nebulizer Xopenex 1.25 mg here.  2. Prednisone __________ taper from 40 mg.  3. Guaifenesin.  4. Refill albuterol nebulizer solution.  5. Schedule to return in 1 month, earlier  p.r.n.                                   Clinton D. Annamaria Boots, MD, [facility], FACP   CDY/MedQ  DD:  04/21/2006  DT:  04/21/2006  Job #:  [id number]   cc:   Marcelino Duster, MD  Marikay Alar. Erik Obey, MD  Fabio Asa, MD  Cletis Athens, MD

## 2011-01-24 NOTE — Cardiovascular Report (Signed)
NAMEDARLEENE, MAZANEC NO.:  [id number]   MEDICAL RECORD NO.:  [id number]          PATIENT TYPE:  OIB   LOCATION:  2899                         FACILITY:  [facility]   PHYSICIAN:  Fabio Asa, M.D.    DATE OF BIRTH:  December 11, 1933   DATE OF PROCEDURE:  12/04/2005  DATE OF DISCHARGE:  12/04/2005                              CARDIAC CATHETERIZATION   INDICATIONS FOR PROCEDURE:  Bileaflet mitral valve prolapse with moderate to  moderately severe mitral regurgitation and moderate left atrial enlargement.   PROCEDURE:  After obtaining written informed consent, the patient was  brought to the cardiac catheterization lab in a post absorptive state.  Preoperative sedation was achieved using Versed 2 mg IV, fentanyl 50 mcg IV.  The right groin was prepped and draped in the usual sterile fashion.  Local  anesthesia was achieved using 1% Xylocaine.  A 6-French hemostasis sheath  was placed into the right femoral artery using the modified Seldinger  technique.  A 7-French hemostasis sheath was placed into the right femoral  vein using the modified Seldinger technique.  Right heart catheterization  was performed using a 7-French Swan-Ganz catheter.  The pressures were  obtained from the pulmonary artery, pulmonary wedge, right ventricle, right  atrium.  Thermodilution cardiac outputs was performed.  Fick cardiac outputs  was performed.  The Swan catheter was then removed.  The heart  catheterization was performed using a 6-French pigtail curved catheter.  Angiography of the left system was performed using a JL-4.  The left system  was noted to be very dominant for the posterior and lateral circulation.  The right coronary artery could not be identified.  An AL-1 was then  advanced.  Root shot was made.  The conus branch of the right was found  coming from the aorta.  The films were reviewed with Dr. Daneen Schick.  There  was no further intervention indicated.  The patient was  transferred to the  holding area.  The hemostasis sheath was removed.  Hemostasis was achieved  using a FemoStop device.   FINDINGS:  The aortic pressure was 126/69, LV pressure was 130/15 with an  EDP of 18.  The right atrial mean pressure was 12, RV pressure was 40/9.  PA  pressure was 40/21 with a mean of 29. Coronary wedge pressure mean was 17.  Fick cardiac output was an inaccurate.  Her thermodilution cardiac output  was 6.2.   FINDINGS:  Left heart catheterization:  The left main coronary artery  trifurcates into the left anterior descending, ramus and circumflex vessel.  There was no disease noted in the left main coronary artery.  Left anterior  descending:  Left anterior descending was a large-caliber vessel. It gives  rise to a large septal perforator which provides anterolateral circulation.  It goes on to end as an apical branch.  There is no disease noted in the  left anterior descending.  The ramus vessel:  The ramus vessel is large-  caliber vessel.  It provides a significant portion of the lateral  myocardium.  Circumflex vessel:  Circumflex vessel covers the  distribution  of the right coronary artery providing distribution to the inferior,  posterior and lateral ventricles.  There was no disease noted in the  circumflex.  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   Single-plane ventriculogram revealed hypokinesis of the mid and  anterolateral portion.  There was otherwise normal wall motion.  Ejection  fraction was 65%.  There was moderate mitral regurgitation noted with  moderate left atrial enlargement noted.   FINAL IMPRESSION:  1.  Moderate mitral regurgitation with moderate left atrial enlargement.  2.  Preserved systolic function.  Questionable previous occlusion of the      diagonal branch resulting in hypokinesis      of the anterolateral region.  3.  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  4.  No identifiable disease  in the left anterior descending, ramus or      circumflex vessel.      Fabio Asa, M.D.  Electronically Signed     HP/MEDQ  D:  12/04/2005  T:  12/04/2005  Job:  [id number]   cc:   Tarri Fuller D. Annamaria Boots, M.D.  [facility]  [place]. [address]  [place]  [place] [address]   Marcelino Duster, M.D.  Fax: [phone]

## 2011-01-24 NOTE — Letter (Signed)
June 10, 2006     Cletis Athens, M.D.  [address]  [address] [place], [place] [address]   RE:  SHAQUENTA, TRIFILETTI  MRN:  [id number]  /  DOB:  17-Nov-1933   Dear Clair Gulling:   I have spoken to you about our difficulties clearing Kanaya Dibona chronic  bronchitis. She forms a very thick mucus which does not respond to  hydration, vibration or mucolytic's very well.  It has been difficult to  suction through a bronchoscope channel in the past.  Since July, we have  been giving her repeated prednisone bursts, other anti-inflammatory measures  and bronchodilators.  She continues on allergy vaccine and gets Xolair  injections.  Chest x-ray in February had shown chronic bronchitic changes  with suspicion of bronchiectasis in the right lower lobe.  Clinically, the  bronchiectasis would be correct.  Pulmonary function tests in January had  shown moderately severe obstructive airways disease with an FVC of 2.48  liters (82% predicted), FEV1 of 1.33 liters (56%) with insignificant  response to bronchodilator and a mildly to moderately reduced diffusion  capacity at 68% of predicted.  Total lung capacity was normal at 87%.  This  study was in line with previous reports.  She did have a cardiac  catheterization in March which had shown moderate mitral regurgitation and  moderate left atrial enlargement with preserved systolic function and no  identifiable coronary disease in the left anterior system.  There had been  question of previous occlusion in a diagonal branch.  Right atrial mean  pressure was 12, wedge pressure of 17.  Cardiac output was reported 6.2 by  thermodilution.  Her PA pressure was 40/21 with a RV pressure of 40/9.   We have been through our battery of pulmonary medications and techniques  including theophyllines, a variety of bronchodilators, steroids, leukotriene  agents and antibiotics.  I am afraid her lungs now are about as good as they  are going to be anytime soon.  If  anesthesia is not able to put her to sleep  for your planned colonoscopy, they might consider intubation, but I am  afraid we have done what I know how to do.  I have offered her referral to  one of the university programs, and she is considering that.    Sincerely,       Clinton D. Annamaria Boots, MD, FCCP, FACP     CDY/MedQ  DD:  06/10/2006  DT:  06/12/2006  Job #:  [id number]

## 2011-01-24 NOTE — Op Note (Signed)
   Emily, Cunningham NO.:  [id number]   MEDICAL RECORD NO.:  [id number]                    PATIENT TYPE:   LOCATION:                                       FACILITY:   PHYSICIAN:  Clinton D. Young, M.D.              DATE OF BIRTH:   DATE OF PROCEDURE:  06/02/2002  DATE OF DISCHARGE:                                 OPERATIVE REPORT   INDICATION FOR PROCEDURE:  A 77 year old woman with asthmatic bronchitis,  recurrent shifting lobar atelectasis related to very thick retained  secretions demonstrated on recent previous bronchoscopy.  The repeat  procedure is being done now using a therapeutic bronchoscope with a larger  suction channel because the instrument used during the first procedure could  not handle the secretions.  She has been treated very aggressively with  medical therapies and hydration.   DESCRIPTION OF PROCEDURE:  After fully informed consent, bronchoscopy was  performed in the endoscopy suite.  No premedication was given.  Oxygen was  provided at eight liters per minute nasal prongs holding saturation over  92%.  Cardiac monitors showed normal rhythm.  The upper airway was then  anesthetized with Cetacaine spray and then 1% Xylocaine.  An accumulative  dose of 6 mg of intravenous Versed was required for additional sedation and  cough control.  The bronchoscope was introduced via the right nostril to the  level of the vocal cords without difficulty.  There were significant amounts  of clear, rubbery, mucoid secretion in the nasopharynx which was suctioned  free.  The cords moved normally and were unremarkable.  Cough was moderate.  The trachea and main carina were unremarkable.  Sequential examination of  each lobar and segmental airway bilaterally again revealed rubbery retained  secretions especially in the upper lung zones.  Otherwise there was a mild  patchy bronchitis but no endobronchial lesions or foreign material.   Using saline  flush, the mucus plugs were lavaged cleared and by the end of  the procedure, all lumens were patent and clear within the visual range of  the bronchoscope.  There were no complications.  No laboratory samples  submitted.  She tolerated it well and is being held until stable before  return to her hospital room.   FINAL IMPRESSION:  Asthmatic bronchitis with recurrent atelectasis due to  retained secretions.                                               Clinton D. Annamaria Boots, M.D.    CDY/MEDQ  D:  06/02/2002  T:  06/03/2002  Job:  [id number]

## 2011-01-24 NOTE — Discharge Summary (Signed)
NAME:  [name], [name]                         ACCOUNT NO.:  [id number]   MEDICAL RECORD NO.:  [id number]                   PATIENT TYPE:  INP   LOCATION:  5522                                 FACILITY:  [facility]   PHYSICIAN:  Marcelino Duster, M.D.         DATE OF BIRTH:  1934-07-04   DATE OF ADMISSION:  06/05/2002  DATE OF DISCHARGE:  06/09/2002                                 DISCHARGE SUMMARY   ADMISSION DIAGNOSES:  1. Rectal bleeding.  2. Soft fecal impaction.  3. Asthma.  4. Chronic recurring sinusitis.  5. Oral Candidiasis.  6. Gastroesophageal reflux disease with Barrett's esophagus.   DISCHARGE DIAGNOSES:  1. Anal fissure.  2. Soft fecal impaction.  3. Asthma.  4. Chronic recurring sinusitis.  5. Oral Candidiasis.  6. Gastroesophageal reflux disease with Barrett's esophagus.   CONSULTATIONS:  Gastroenterology, Dr. Cletis Athens.   PROCEDURE:  Flexible sigmoidoscopy June 08, 2002.   HISTORY OF PRESENT ILLNESS AND HOSPITAL COURSE:  This is a 77 year old white  female with history of asthma, gastroesophageal reflux disease, chronic  sinusitis and allergic rhinitis recently discharged after a 16 day hospital  stay with Dr. Keturah Barre for pneumonia and asthma exacerbation.  The  patient had been discharged to home June 04, 2002.  The patient stated  on admission that she had had significant problems with constipation,  straining with bowel movements and painful, what she thought were,  hemorrhoids.  After returning home yesterday she developed bright red blood  per rectum with multiple episodes and no stool production.  She stated that  her rectal area was quite sore.  She denied any fevers, chills or abdominal  pain.   On Dr. Jenny Reichmann Griffin's examination, who was the admitting physician, her  vital signs were stable.  Her abdominal examination was benign.  Her rectal  examination showed external skin tags, tenderness and erythema in the  external anus, no  obvious mass or hemorrhoid and bright red blood per  rectum, soft stool impacted there as well.   Her laboratory data showed a white blood cell count of 11.7, hemoglobin 9.4,  platelet count 156,000.  Hemoglobin was similar to that of her recent  discharge level.  Electrolytes were normal.   The patient was admitted and her problems during her hospital course were as  follows:   #1 - ACUTE RECTAL BLEEDING WITH PAIN:  After 24 hours of admission and no  improvement in pain with stool softeners, Anusol-H suppositories and cream  as well as Sitz baths, Dr. Mindi Curling was consulted for evaluation.  The  patient underwent flexible sigmoidoscopy on June 08, 2002 and was found to  have an anal fissure with large amounts of impacted soft stool above with no  blood.  The patient was given a Fleet's enema that afternoon and was started  on nitroglycerin ointment for the discomfort.  She did have improved bowel  movement with the Fleet's enema  and continues to be sore but her pain at  this point is much improved.   #2 - ASTHMA:  The patient chronically has expiratory wheezing and she had  that intermittently throughout the hospitalization and this seems to be  improving.   #3 - ORAL CANDIDIASIS:  Improving on nystatin swish and swallow.   #4 - GASTROESOPHAGEAL REFLUX DISEASE:  Stable throughout the patient's  hospitalization.  She continues on proton pump blockade.   DISCHARGE INSTRUCTIONS:  The patient was thus discharged on June 09, 2002  to continue with nitroglycerin ointment to anus twice daily, 0.2%.  She may  use Darvocet as needed.  Anusol suppositories and ointment as needed with  Colace and MiraLax to keep her stools soft as well.   FOLLOW UP:  Patient will follow up with me in one to two weeks in the  office, sooner if she has any problems and we can gradually wean the  nitroglycerin ointment as the pain subsides.   With regards to her asthma, she is now on theophylline  200 mg twice daily  and she is to continue to wean off her prednisone, should be off three days  post discharge and will get her started back on her corticosteroid inhaler.  The patient is not sure which one she is using at this time.   DISCHARGE MEDICATIONS:  1. Anusol-HC suppository per rectum three times daily.  2. Nasonex 2 sprays each nostril daily.  3. Nexium 40 mg p.o. q.d.  4. Theo-Dur 200 mg p.o. b.i.d.  5. Colace 100 mg p.o. b.i.d.  6. Prednisone 10 mg twice daily today, once daily tomorrow, 5 mg on Saturday     and then off.  7. MiraLax one teaspoon in 8 ounces of fluid daily.  8. Nystatin 5 ml swish and swallow four times daily through June 18, 2002.  9. Anusol ointment to rectal area three times daily.  10.      Albuterol MDI or neb four times daily as needed.  11.      Darvocet-N 100 one tablet q.4h. as needed for pain.  12.      Nitroglycerin ointment 0.2% small amount applied with glove to     rectal area after Sitz bath.  13.      Flovent 110 mcg two puffs twice daily and to rinse her mouth out     after each use.                                               Marcelino Duster, M.D.    EMM/MEDQ  D:  06/09/2002  T:  06/13/2002  Job:  [id number]   cc:   Tarri Fuller D. Young, M.D.  [place]. [place]  [place] [address]  Fax: [phone]

## 2011-01-24 NOTE — Assessment & Plan Note (Signed)
[place]                             PULMONARY OFFICE NOTE   [name], [name]                      MRN:          [id number]  DATE:10/06/2006                            DOB:          1933-09-22    PROBLEMS:  1. Chronic sinusitis.  2. Nasal polyposis.  3. Chronic asthmatic bronchitis with mucus plugging.  4. Mitral valve prolapse.  5. Esophageal reflux.   HISTORY:  I had last seen her in October.  She has continued Xolair  injections 300 mg each month, and she has continued allergy vaccine at  1:10 without problems.  In the last three weeks, she has had increased  cough and shortness of breath, a tight pressure sensation in her head  which she calls sinuses and nasal discharge which is turning purulent.  She has not had headache or definite fever.  Her new primary physician  at [facility] had given her an antibiotic and prednisone taper and  had done a chest x-ray, she understands was negative.   MEDICATIONS:  1. Xolair 300 mg per month.  2. Lipitor 10 mg.  3. Nasonex.  4. Advair 100/50 b.i.d.  5. Nexium 40 mg b.i.d.  6. Tranxene 7.5 mg p.r.n.  7. Caltrate with vitamin D.  8. Lasix 40 mg.  9. Potassium.  10.Allergy vaccine.  11.Rescue albuterol inhaler.  Also, albuterol via nebulizer used up to      q.i.d. p.r.n.  12.Mucinex.  13.Epinephrine Twinject.   Drug intolerance to PENICILLIN, DOXYCYCLINE, CLINDAMYCIN, AUGMENTIN,  BIAXIN, and ZYFLO.   OBJECTIVE:  VITAL SIGNS:  Weight 210 pounds.  BP 134/82, pulse regular  at 99, room air saturation 95%.  LUNGS:  There is unlabored wheeze, particularly in the right upper lung  zone.  HEENT:  Mild hoarseness without stridor or visible postnasal drainage.  Moderate nasal congestion without visible polyps.  Tympanic membranes  are clear.  Conjunctivae are not injected.  CARDIAC:  Heart sounds regular.  I cannot hear a murmur or click over  her lung noise.  There is no peripheral edema or  cyanosis.   IMPRESSION:  1. Exacerbation of rhinosinusitis.  2. Chronic asthma/chronic obstructive pulmonary disease.   PLAN:  1. We will pull a chest x-ray report from the [facility] for our files.  2. Entex PSE 1 b.i.d. as tolerated.  3. Avelox 400 mg daily x10 days.  4. Nasal nebulizer, Neo-Synephrine.  5. Depo-Medrol 80 mg IM with steroid talk.  6. She will keep her scheduled appointment, earlier p.r.n.     Clinton D. Annamaria Boots, MD, [place] Flood, [place]  Electronically Signed    CDY/MedQ  DD: 10/07/2006  DT: 10/07/2006  Job #: [id number]   cc:   Marikay Alar. Erik Obey, M.D.  Cletis Athens, M.D.  [place], [address] [place]

## 2011-01-24 NOTE — Op Note (Signed)
[place]. [facility]  Patient:    [name], [name]                      MRN: [id number] Proc. Date: 12/29/00 Adm. Date:  0000000 Attending:  Lilyan Gilford CC:         Clinton D. Annamaria Boots, M.D.  Verdis Prime, M.D.  Parks Ranger, M.D   Operative Report  PREOPERATIVE DIAGNOSES: 1. Chronic polypoid pansinusitis. 2. Nasal septal deviation.  POSTOPERATIVE DIAGNOSES: 1. Chronic polypoid pansinusitis. 2. Nasal septal deviation.  PROCEDURES: 1. Bilateral complete endoscopic sphenoethmoidectomy. 2. Bilateral endoscopic antrostomy. 3. Limited nasal septoplasty. 4. InstaTrak orientation.  SURGEON:  Marikay Alar. Erik Obey, M.D.  ANESTHESIA:  General orotracheal.  ESTIMATED BLOOD LOSS:  50 cc.  COMPLICATIONS:  Exposed orbital fat, left.  FINDINGS:  Very narrow nose overall.  A slight leftward septal deviation anteriorly, prominent chondroethmoid spur posterior on the left with impingement on the lateral wall of the nose and difficulty in accessing the middle meatus and posterior nose.  Polypoid mucosal edema diffusely with some thick but not infected inspissated mucus in both antra and both sphenoid sinuses.  Rather soft lamina papyracea bilaterally with some mobility of the lamina papyracea of the right side with no violation.  On the left side, some exposure of orbital fat, which was recognized and avoided.  Relatively patent nasofrontal recess bilaterally.  Small sphenoid sinuses.  DESCRIPTION OF PROCEDURE:  With the patient in a comfortable supine position, general orotracheal anesthesia was induced without difficulty.  At an appropriate level, the patient was placed in a slight sitting position.  A saline-moistened throat pack was placed.  Nasal vibrissae were trimmed. Cocaine crystals, 200 mg total, were applied on cotton carriers to the anterior ethmoid and sphenopalatine ganglion regions on both sides.  Xylocaine with phenylephrine  solution, 4 cc total, was placed on 1/2 x 3 inch cottonoids up against the nasal septal mucosa on both sides.  Finally, 1% Xylocaine with 1:100,000 epinephrine, 8 cc total, was infiltrated into the submucoperichondrial plane of the septum on both sides, into the membranous columella, and into the anterior floor of the nose.  Several minutes were allowed for this to take effect.  The materials were removed from the nose and appeared to be intact and correct in number.  Using the 0 degree endoscope, additional 1% Xylocaine with 1:100,000 epinephrine, 6 cc total, was infiltrated into the lateral wall of the nose and middle meatus region and into the middle turbinate on both sides. Additional Xylocaine with phenylephrine solution was applied on 1/2 x 3 inch cottonoids and placed into the middle meatus and between the middle turbinate and the septum.  Several additional minutes were allowed for hemostasis to take effect.  The InstaTrak apparatus was applied and oriented/registered. The InstaTrak and the regular CT films were reviewed.  Reinspecting the nose, the chondroethmoid spur was felt to be substantial enough to limit access to the posterior middle meatus and to the sphenoethmoidal recess.  Therefore, election was made to perform a septoplasty.  Beginning on the right side approximately 1.5 cm behind the membranous columella, a vertical Killian-type incision was sharply executed and carried down to the quadrangular cartilage.  The submuperichondrial plane was elevated and carried back onto the perpendicular plate of the ethmoid. The chondroethmoid junction was identified and lysed, and the operative submucoperiosteal plane was raised.  The inferior perpendicular plate was mobilized.  The superior perpendicular plate was lysed with an open  Jansen-Middleton forceps to avoid rocking in the cribriform plate region.  The inferior portions were dissected, including a long cartilaginous tail  from the quadrangular cartilage and the heavy spur at the chondroethmoid junction. This was all submucosally resected.  The anterior septum was not dislocated from the maxillary crest nor in any other way disturbed.  There was mild oozing.  After completing removal of all bone chips and removal of the spur, the incision was reapproximated with interrupted 4-0 chromic gut.  The flaps were quilted with 4-0 plain gut.  Hemostasis was spontaneous.  After completing the septoplasty, the cotton pledgets were removed from the right side.  The 0 degree endoscope was used to inspect the nose with the findings as described above.  The middle turbinate was gently mobilized medially.  The uncinate process was identified.  It was sharply incised in a sagittal plane using the sickle knife and then partially avulsed.  It was further removed with the power debrider.  Curved suction tip was placed into the antrum to identify the natural antrostomy, and this was widened with the backbiting forceps up into the region of the uncinate process and again cleaned with the power debrider.  The anterior face of the bulla ethmoidalis was opened with a punch forceps and then carefully trimmed using the power debrider.  The horizontal lamella of the middle turbinate was left intact, but the vertical lamella was identified and penetrated.  Working inferomedially, bony partitions were broken down.  Finally the superior turbinate was identified and partially debrided.  Sphenoid ostium was identified and enlarged inferiorly and then laterally.  Bony partitions communicating the sphenoid with the posterior ethmoid cells were broken down.  InstaTrak was used to identify orientation in the sphenoid sinus, as was orientation through the nose just above the columella.  Working forward using an upper punch forceps to identify partitions, these were broken and then debrided.  Additional attention to the antrostomy reveled some  Haller cells, which were broken down and debrided.  The 30 degree scope was used to inspect the anterior ethmoid region.  Using the curved suction tip and the upbiting punch, cells were broken down in this vicinity, and finally the nasofrontal recess was identified.  Bony chips and mucosal remnants were carefully debrided to allow a wide opening into the frontal sinus and to reduce the agger nasi cells.  At this point, the lamina papyracea was somewhat mobile but was intact, and the bony partitions had been carefully cleaned both from the lamina and from the fovea ethmoidalis.  There was no evidence of violation of the fovea nor spinal fluid leak.  Hemostasis was spontaneous.  Xylocaine with phenylephrine solution on moistened cottonoids was placed to the ethmoid block for hemostasis.  Attention was turned to the left side.  Once again, viewing with the 0 degree scope, the turbinate was medialized, uncinate process was identified, lysed with the sickle knife, and debrided.  The natural antrum ostium was identified and opened with a backbiting forceps and the inferior portion of the uncinate process identified and further debrided in this fashion.  The anterior face of the bulla was opened, and the bulla was debrided with punch forceps and with the power debrider.  The vertical lamella of the middle turbinate was identified and penetrated inferomedially, and the posterior ethmoid cells were opened.  These were communicated into the sphenoid sinus after first debriding part of the superior turbinate and opening the natural sphenoid ostium for orientation.  Again bony partitions were  broken down.  A large Haller cell was identified in the antrum and was broken down and debrided.  The nose was quite narrow anteriorly and working up toward the agger nasi region, what appeared to be polypoid mucosa was noted and then on palpation of the globe was noted to be orbital fat that was not further  debrided.  Working separate from the orbital fat exposure, agger nasi cells were broken down, the nasofrontal recess was entered with a curved suction tip, and bony partitions were carefully debrided using giraffe forceps.  A decent opening into the frontal sinus was generated, and all mucosal remnants and bony spicules were removed, taking care not to disturb the mucosa up into the sinus proper.  At this point, careful inspection revealed a good cleaning along the lamina papyracea and the fovea ethmoidalis with no evidence of spinal fluid leak.  A Xylocaine with phenylephrine-moistened pledget was placed into the ethmoid block.  Returning to the right side, the pledget was removed and the area was inspected.  No spinal fluid leak.  No orbital fat exposure.  Good removal of all bony partitions, and no bleeding.  The right side was completed.  The left side was inspected once again.  Note that the InstaTrak had been used on both sides to orient into the agger nasi region and back into the sphenoid.  Again the left side was felt to have been debrided properly.  At this point, a small piece of Gelfilm was placed against the exposed orbital fat.  Reinforced 0.040 Silastic splints were fashioned against the septum, placed and secured with a 3-0 nylon for hemostasis.  Triple-thickness Neosporin- impregnated Telfa packs with a 2-0 silk tag were placed into the ethmoid block on both sides, taking care not to disturb the Gelfilm.  Nasal 6.5 mm trumpets were shortened to fit into the nasopharynx and were placed low in the nose to help her breathe and to support the septum for hemostasis.  At this point, the procedure was completed.  Hemostasis was observed.  The pharynx was suctioned free, and the throat pack was removed.  The patient was returned to anesthesia, awakened, extubated, and transferred to recovery in stable condition.  COMMENT:  A 77 year old white female with refractory asthma,  recurrent sinusitis, polypoid pansinusitis, on multiple repeated x-rays despite maximal medical management, were the several issues indicating surgery today. Anticipate a routine postoperative recovery with attention to ice, elevation, analgesia, and antibiosis.  The patients vision and range of motion were intact in each eye in the recovery room.  Will observe her overnight for logistical reasons as well as for health reasons. DD:  12/29/00 TD:  12/30/00 Job: [id number]

## 2011-01-24 NOTE — Discharge Summary (Signed)
[facility]  Patient:    [name], [name] Visit Number: [id number] MRN: [id number]          Service Type: SUR Location: 1W 0153 01 Attending Physician:  Barbera Setters Dictated by:   Edsel Petrin. Dalbert Batman, M.D. Admit Date:  01/24/2002 Discharge Date: 01/28/2002   CC:         Alyson Locket. Sammuel Cooper, M.D.  Clinton D. Annamaria Boots, M.D.  Mack Hook, M.D.   Discharge Summary  FINAL DIAGNOSES: 1. Gastroesophageal reflux disease, complicated by Barretts esophagus. 2. Asthmatic bronchitis with recurrent hospitalizations for pneumonia. 3. Mitral valve prolapse. 4. Glaucoma.  OPERATIONS PERFORMED:  Laparoscopic Nissen fundoplication.  HISTORY OF PRESENT ILLNESS:  This is a 77 year old white female who has had heartburn for many years.  This has been progressive with progressive regurgitation when supine, progressive coughing, choking, hoarseness, and wheezing.  She has not had any dysphagia.  She has been hospitalized a few times over the past two years with pneumonia.  She has been followed by Dr. Cletis Athens and Dr. [name].  Upper endoscopy in 2001 showed a mild distal stricture of the esophagus, which was dilated, and she was found to have Barretts esophagus but no dysplasia.  She continues to have pulmonary problems.  She has been evaluated by Dr. Sammuel Cooper, and it is thought that her reflux is exacerbating her pulmonary problems.  Esophageal manometry looks good.  Gallbladder ultrasound is normal.  She was scheduled electively for antireflux surgery following discussion amongst all of her physicians.  PHYSICAL EXAMINATION:  GENERAL:  A pleasant older woman, slightly overweight, in no distress, a little bit nervous.  NECK:  No mass, no bruit.  CHEST:  Good inspiratory effort.  Bilateral expiratory wheezes noted.  No rales, no rhonchi, no dullness.  CARDIAC:  Regular rate and rhythm, no murmurs.  ABDOMEN:  Soft, nontender.  No  hernias noted.  A well-healed right lower quadrant scar.  EXTREMITIES:  No edema, good pulses.  NEUROLOGIC:  Grossly normal.  HOSPITAL COURSE:  On the day of admission the patient was taken to the operating room.  She underwent a laparoscopic Nissen fundoplication and closure of her hiatus of the diaphragm.  That surgery was uneventful.  Postoperatively she was followed by Legrand Como B. Melvyn Novas, M.D., and Dr. [name] for management of her pulmonary problems.  Inhalers and expectorant medications were ordered appropriately.  She actually was quite stable from a pulmonary standpoint.  We started a clear liquid diet on postoperative day #1.  She stated after 24 hours that this was a little bit difficult to swallow and the liquids felt like they were sticking in her lower esophagus.  We got a Gastrografin swallow, which showed that there was mild edema of the fundoplication but that this did not appear unusual for postop day #2.  The esophagus drained into the stomach just fine.  She was reassured.  The patient did well enough to be discharged on Jan 28, 2002.  At that time she was swallowing okay, although she still had a sensation of transient dysphagia but no nausea, no vomiting, no reflux, and was tolerating more than 1000 cc of fluids per shift.  She had a bowel movement, her abdomen was soft, and her wounds looked good and she wanted to go home.  DISCHARGE DISPOSITION:  She was discharged on Jan 28, 2002.  DISCHARGE INSTRUCTIONS:  She was told to stay on a full liquid diet and return to see me in the office in  about one week.  DISCHARGE MEDICATIONS:  She was advised to take Advil or Tylenol for pain. Otherwise she will continue her usual medication regimen, which includes Tranxene, Zoloft, Zyrtec, Citracal, vitamin E, and inhalers.Dictated by: Edsel Petrin. Dalbert Batman, M.D. Attending Physician:  Barbera Setters DD:  02/17/02 TD:  02/17/02 Job: [id number]

## 2011-01-24 NOTE — Assessment & Plan Note (Signed)
[place]                               PULMONARY OFFICE NOTE   [name], [name]                        MRN:          [id number]  DATE:07/07/2006                            DOB:          [birth year]    PROBLEMS:  1. Chronic rhinosinusitis.  2. Nasal polyposis.  3. Chronic asthmatic bronchitis with mucous plugging.  4. Mitral valve prolapse.  5. Esophageal reflux.   HISTORY:  Her chest is much better and she thinks the recent rain made a  difference, but at the same time her sinuses got worse.  She thinks she  might have a sinus infection because of bad taste in her mouth and retro-  orbital and nasal pressure without headache.  She has a new primary  physician at [facility] who gave her Levaquin and a prednisone taper,  which she thinks helped.  She had finished her last prednisone taper from  this office at the beginning of October.  We talked about her allergy  vaccine and her Xolair.  She has tolerated the Xolair and that seems to have  helped, at least a little.  She has been on the allergy vaccine since skin  testing 7 years ago and we feel that is enough.  She had talked with Dr.  Sammuel Cooper who wants to avoid anesthesia because of her breathing problems.  They were looking into virtual colonoscopy.  Barium swallow showed a  stricture at the site of her old fundoplication obstructing a 12.5-mm  tablet.  She will follow up those issues as appropriate with Dr. Sammuel Cooper.  She has had flu vaccine.   MEDICATION:  1. Xolair 300 mg per month.  2. Lipitor 10 mg.  3. Nasonex two puffs daily.  4. Advair 100/50.  5. Nexium 40 mg b.i.d.  6. Tranxene 7.5 mg.  7. Caltrate with vitamin D.  8. Potassium.  9. Allergy vaccine.  10.She is currently on a prednisone taper and an extended course of      Levaquin.  11.Home nebulizer with albuterol.  12.Albuterol rescue inhaler.  13.Mucinex.  14.Epinephrine Twinject.   DRUG INTOLERANCE:   PENICILLIN, DOXYCYCLINE, CLINDAMYCIN, AUGMENTIN, BIAXIN,  and ZYFLO which causes GI upset.   OBJECTIVE:  VITAL SIGNS:  Weight 215 pounds, BP 98/62, pulse regular at 80,  room air saturation 95%.  GENERAL:  She does not look in any distress today and looks much better than  she did during the hard times in mid summer.  HEENT:  There is some mucous crusting in her nose.  Her pharynx is clear.  There is no stridor or neck vein distention.  CHEST:  Very clear for her with no rales, wheeze, or rhonchi today.  No  increased work of breathing.  HEART SOUNDS:  Regular.  I cannot hear a click or murmur through her  clothing.  EXTREMITIES:  There is no cyanosis, clubbing or edema.   IMPRESSION:  1. Chronic asthma with a fixed obstructive component.  FEV1 was 1.33 (56%      of predicted) and FEV1/FVC ratio 0.65 after bronchodilator in January.  2. Chronic allergic rhinitis and rhinosinusitis for which she has seen Dr.      Erik Obey and wanted to delay any surgery.  3. At least mild esophageal stricture with suspected occasional reflux.   PLAN:  1. She is going to finish her present supply of allergy vaccine and stop      allergy shots at that point as discussed.  2. She will finish her Levaquin.  3. Resume nasal saline lavage.  4. Continue Xolair and present bronchodilator/antiinflammatory      medications.  5. Schedule return 4 months, earlier p.r.n.     Clinton D. Annamaria Boots, MD, Shade Flood, [place]  Electronically Signed    CDY/MedQ  DD: 07/07/2006  DT: 07/08/2006  Job #: [id number]   cc:   Cletis Athens, M.D.  Marikay Alar. Erik Obey, M.D.

## 2011-01-24 NOTE — Procedures (Signed)
[place]  Patient:    [name], [name] Visit Number: [id number] MRN: [id number]          Service Type: END Location: ENDO Attending Physician:  Wendie Simmer Dictated by:   Alyson Locket. Sammuel Cooper, M.D. Proc. Date: 07/05/01 Admit Date:  07/05/2001                             Procedure Report  INDICATIONS:  Ms. Almario has had a long history of reflux esophagitis and has Barretts esophagus as well as reflux-aggravated COPD and asthma.  The purpose of this procedure was to rule out abnormalities in the body of the esophagus that would preclude a surgical fundoplication.  RESULTS: 1. Lower esophageal sphincter.  The lower esophageal sphincter is the least    reliable portion of this; however, it appeared to be somewhat diminished in    terms of resting pressure and it was at the appropriate length.  It did    relax 98% with swallowing and the resting pressure was 12.7 mmHg with an    intragastric pressure of 10 mmHg. 2. Esophageal body.  The patient had 20 wet swallows which produced    peristaltic contractions.  There were no simultaneous, spontaneous,    repetitive, or retrograde contractions. 3. Upper esophageal sphincter.  The upper esophageal sphincter had a resting    pressure of 20 mmHg and relaxed on time.  IMPRESSION: 1. No evidence of diffuse esophageal spasm and therefore, this patient can    safely undergo esophageal fundoplication. 2. Lower esophageal sphincter resting pressure slightly diminished. Dictated by:   Alyson Locket. Sammuel Cooper, M.D. Attending Physician:  Wendie Simmer DD:  07/06/01 TD:  07/06/01 Job: [id number]

## 2011-01-24 NOTE — Consult Note (Signed)
[facility]  Patient:    [name], [name] Visit Number: [id number] MRN: [id number]          Service Type: SUR Location: 1W 0153 01 Attending Physician:  Barbera Setters Dictated by:   Christena Deem. Melvyn Novas, M.D. [facility] Proc. Date: 01/24/02 Admit Date:  01/24/2002   CC:         Edsel Petrin. Dalbert Batman, M.D.  Clinton D. Annamaria Boots, M.D.  Mack Hook, M.D.   Consultation Report  REQUESTING PHYSICIAN:  Edsel Petrin. Dalbert Batman, M.D.  REASON FOR CONSULTATION:  Asthma.  HISTORY:  This is a 77 year old white female remote smoker with a several year history now of difficult to control asthma resulting in frequent admissions, the most recent of which is documented in an admission note on November 27, 2001 and discharged on December 03, 2001 with refractory asthma associated with clinical evidence of dyspnea, cough, symptoms of chronic rhinitis with nasal congestion, intermittent sinus infections followed by Dr. Erik Obey, and intermittent bronchitis followed by Dr. Keturah Barre.  She is also felt to have severe reflux complicated by Barretts esophagitis which was refractory to outpatient management and therefore has undergone Nissen fundoplication today. She reported that she had not been on prednisone since her admission, but in fact she has been taking prednisone right up until yesterday because Dr. Annamaria Boots did not want her to have a flare-up during the immediate preoperative period.  She presently denies any active sinus complaints, that is nasal congestion, itching, sneezing, significant rhinitis.  She has hoarseness post extubation in the PACU, but denies any throat pain, difficulty swallowing, choking, chest pain, fevers, chills, or dyspnea at rest on oxygen.  PAST MEDICAL HISTORY: 1. Chronic rhinitis/sinusitis.  Most recent sinus CT scan in May showed    changes consistent with chronic sinusitis only. 2. GERD with Barretts esophagitis documented previously by Dr.  Sammuel Cooper in    2001. 3. Mitral valve prolapse, asymptomatic.  ALLERGIES:  She lists BIAXIN, AUGMENTIN, CLINDAMYCIN all with nonspecific reactions.  MEDICATIONS:  She is maintained on Nexium, Reglan, Advair diskus one puff b.i.d. (dose not specified), p.r.n. albuterol, and Zantac.  PAST SURGICAL HISTORY:  She is status post appendectomy remotely and also sinus surgery by Dr. Erik Obey in [id number].  SOCIAL HISTORY:  She lives alone in [place].  She is widowed.  She is retired from [employer].  She quit smoking 25 years ago.  She denies any alcohol use.  REVIEW OF SYSTEMS:  Taken in detail and essentially negative except as noted above.  FAMILY HISTORY:  Negative for respiratory diseases or atopy.  PHYSICAL EXAMINATION  GENERAL:  This is a slightly anxious, but quite pleasant white female who is hoarse, but has no obvious strider in the PACU nor any increased work or breathing or diaphoresis on nasal oxygen at 2 L with adequate saturations and vital signs.  HEENT:  Remarkable for nonspecific ______.  Oropharynx is clear with no evidence of postnasal drainage or cobblestoning.  Dentition is intact.  Ear canals are clear bilaterally.  NECK:  Supple without cervical adenopathy, tenderness.  Trachea midline.  No thyromegaly.  LUNGS:  Fields reveal prominent pseudo wheezing initially.  With purse lip maneuver this almost completely extinguished.  HEART:  Regular rate and rhythm without murmur, rub, or gallop.  Expiratory time was minimally prolonged.  ABDOMEN:  Soft, benign with no palpable organomegaly, masses, tenderness with typical postoperative changes.  EXTREMITIES:  Warm without calf tenderness, clubbing, cyanosis, edema.  NEUROLOGIC:  No focal deficit, pathologic reflexes.  SKIN:  Warm and dry.  LABORATORY DATA:  Pending.  IMPRESSION:  This patient appears to have more upper airway than true wheezing as evidenced by the fact that purse lip maneuver almost completely  eliminated all the wheezing.  I told her we need to teach her how to breathe again including the use of purse lipping.  Will try to help her with a flutter valve.  In terms of her true asthmatic component, I am not convinced that there is one, but would continue low dose Advair for now (higher doses are going to risk upper airway irritability) and gave her a single dose of Depo-Medrol injection 100 mg IM to offset any steroid withdrawal in the immediate postoperative period.  Chronic rhinitis will need to continue to be managed by Dr. Erik Obey long-term to reduce her exacerbation rate of asthma but I would hope that with the above measures in place we should be able to avoid systemic steroids in the future in this nice lady. Dictated by:   Christena Deem. Melvyn Novas, M.D. [place] Attending Physician:  Barbera Setters DD:  01/24/02 TD:  01/26/02 Job: [id number]

## 2011-01-24 NOTE — Assessment & Plan Note (Signed)
[place]                             PULMONARY OFFICE NOTE   DESIREE, DRAGOTTA                      MRN:          [id number]  DATE:12/02/2006                            DOB:          [birth year]    PROBLEM:  1. Chronic sinusitis.  2. History of nasal polyposis.  3. Chronic asthmatic bronchitis with mucus plugging.  4. Mitral valve prolapse.  5. Esophageal reflux.   HISTORY:  She comes today saying that she has had generalized pressure  enough that it effects her vision I think by distracting her, but that  her chest is relatively good. She had some purulent sputum about a month  ago that we had called in Avelox again  as of March 14, for sinus  infection following a flu syndrome. She says the sputum has now cleared  to just thick white and she is using saline lavage as taught by Dr.  Erik Obey while continuing Nasonex. She is not having significant sneezing  or itching, fever or blood. She continues Xolair injections 300 mg per  month. Allergy vaccine was stopped October of 2007.   MEDICATIONS:  Medications continue to be listed and were reviewed.   OBJECTIVE:  Weight 214 pounds.  Blood pressure is 122/70.  Pulse regular  at 87.  Room air saturation is 95%.  Breath sounds were coarse.  HEART: Sounds irregular without murmur.  She is not coughing or wheezing today.  I cannot see nasal polyps anteriorly. Nasal mucosa is somewhat pale, but  not markedly edematous with my tools. There is cerumen in the ear  canals. It do not think it is obstructing. No cervical adenopathy, post-  nasal drainage or stridor.   IMPRESSION:  1. Rhinitis with history of nasal polyps and possible residual      sinusitis.  2. Chronic asthma with chronic obstructive pulmonary disease.   PLAN:  1. Limited CT scan of sinus, which she wants done at [facility], which      is much easier for her.  2. Sudafed PE  3. Continue saline lavage and routine asthma  medications.  4. I reminded her that Dr. Erik Obey had previously recommended nasal      polypectomy and she      had wanted to wait.  5. I am scheduling return in one month, but earlier p.r.n.     Clinton D. Annamaria Boots, MD, [place], [place]  Electronically Signed    CDY/MedQ  DD: 12/02/2006  DT: 12/02/2006  Job #: [id number]   cc:   Marikay Alar. Erik Obey, M.D.  Cletis Athens, M.D.

## 2011-01-24 NOTE — Assessment & Plan Note (Signed)
[place]                               PULMONARY OFFICE NOTE   FEDORA, GAMACHE                      MRN:          [id number]  DATE:06/09/2006                            DOB:          [birth year]    PULMONARY FOLLOWUP.   PROBLEM:  1. Chronic rhinosinusitis.  2. Nasal polyposis.  3. Chronic asthmatic bronchitis with mucous plugging.  4. Mitral valve prolapse.  5. Esophageal reflux.   HISTORY:  She had called in with an exacerbation on June 05, 2006, and  was started on a prednisone taper from 40 mg by Dr. Lenna Gilford.  She had just  taken a prednisone taper with Avalox when she saw me May 15, 2006.  We  tried getting her started on Zyflo hoping from steroid-sparing effect but  she had to stop Zyflo after 4 days because of nausea and diarrhea.  She  likes the cooler weather.  She continues to have difficulty actually  coughing out anything despite her rattling and congestion.  What she does  see is usually thick, like glue, and white.  There has be nothing bloody or  purulent.  Her colonoscopy had been delayed earlier this summer because  Anesthesia was alarmed by her respiratory pattern.  Unfortunately we have  not been able to make a great deal of medical difference.  When she has been  bronchoscoped in the past, secretions have been very thick and difficult to  get through the suction channel of the scope.  I spoke with her about the  possibility that her colonoscopy might be done under general anesthesia,  which would allow an opportunity to intubate and suction more completely  while stabilizing her airway for the GI procedure.  I spoke with Dr.  Sammuel Cooper about that by phone today.  I had been under the impression that an  upper endoscopy was being considered.  I have questioned to what extent  reflux might be aggravating her respiratory problems.  Dr. Sammuel Cooper  indicated that a barium swallow/upper GI might be an appropriate way  to  evaluate the upper GI tract for reflux and aspiration while he asked for a  letter from me explaining for anesthesia purposes that her lungs were as  good as we were going to be able to get them.  I am afraid in the short-term  that appears to be true.   IMPRESSION:  1. Chronic asthmatic bronchitis, severe.  2. There may be a component of bronchomalacia contributing to some of the      nonproductive vibratory rattle.   PLAN:  1. She will finish her current prednisone taper.  2. Flu vaccine.  3. Letter to Dr. Sammuel Cooper.  4. Schedule return 1 month, earlier p.r.n.  5. I have talked with her about referral for a second opinion at one of      the universities, with no definite plans made.       Clinton D. Annamaria Boots, MD, FCCP, FACP      CDY/MedQ  DD:  06/10/2006  DT:  06/12/2006  Job #:  [id number]   cc:  Cletis Athens, M.D.  Marikay Alar. Erik Obey, M.D.

## 2011-01-24 NOTE — Assessment & Plan Note (Signed)
[place]                               PULMONARY OFFICE NOTE   [name], [name]                      MRN:          [id number]  DATE:03/23/2006                            DOB:          [birth year]    PROBLEM:  1.  Chronic rhinosinusitis.  2.  Nasal polyposis.  3.  Chronic asthmatic-bronchitis with mucus plugging.  4.  Mitral valve prolapse.   HISTORY:  She says this has been a bad month and blames the weather.  Dr.  Amil Amen put her on a prednisone taper last week.  She did fairly well for a  trip to [place], but she has been using her albuterol heavily and needs  refill.  We discussed this.  Many medications tend to bother her esophagus  and I spent a long time talking with her today about reflux as a possible  aggravating factor and cyclical complication interacting with her asthma.  She has not seen Dr. Sammuel Cooper in quite a while and I have encouraged her to  review her reflux status with him.  She has had nothing recently that is  frankly purulent or bloody but we talked about possible trial of an  antibiotic lacking much else to offer.  She continues Xolair and she  continues allergy vaccine at 1:10, having previously failed Singular and  theophylline.   MEDICATIONS:  1.  Xolair 300 mg per month.  2.  Lipitor 10 mg.  3.  Nasonex 2 puffs b.i.d. daily.  4.  Advair 100/50 b.i.d.  5.  Nexium 40 mg b.i.d.  6.  Tranxene 7.5 mg p.r.n.  7.  Caltrate with vitamin D.  8.  Lasix 40 mg.  9.  Potassium.  10. Allergy vaccine.  11. Current prednisone taper.  12. Home nebulizer with albuterol and also albuterol inhaler.  13. Mucinex.   DRUG INTOLERANCE:  Drug intolerance to PENICILLIN, DOXYCYCLINE, CLINDAMYCIN,  AUGMENTIN AND BIAXIN.   OBJECTIVE:  Weight 206 pounds, BP 122/84, pulse regular 84, room air  saturation 93%.  Mild nasal congestion, no obvious distress.  Bilateral inspiratory and expiratory wheezy rhonchi and congested  nonproductive cough.  Heart sounds regular without murmur.  No adenopathy, no edema.   IMPRESSION:  1.  Exacerbation of chronic asthmatic bronchitis with chronic obstructive      pulmonary disease.  I think this includes a definite fixed component      now.  2.  Chronic rhinosinusitis and nasal polyposis with strongly suspected      relationship to her esophageal reflux.   PLAN:  1.  Try Ceclor 500 mg b.i.d. for 7 days because she thinks she can tolerate      cephalosporins.  2.  Supplement her current acid blocker regimen between doses with a coating      liquid antacid until she can get in to talk with Dr. Sammuel Cooper.  It may      be time for her to have an upper endoscopy.  3.  Schedule return with me one month, earlier p.r.n.  Clinton D. Annamaria Boots, MD, [facility], FACP   CDY/MedQ  DD:  03/25/2006  DT:  03/26/2006  Job #:  [id number]   cc:   Marcelino Duster, MD  Marikay Alar. Erik Obey, MD  Fabio Asa, MD  Cletis Athens, MD

## 2011-01-24 NOTE — Assessment & Plan Note (Signed)
[place]                               PULMONARY OFFICE NOTE   [name], [name]                      MRN:          [id number]  DATE:05/15/2006                            DOB:          20-Jan-1934    PROBLEM:  1. Chronic rhinosinusitis.  2. Nasal polyposis.  3. Chronic asthmatic bronchitis with mucus plugging.  4. Mitral valve prolapse.  5. Esophageal reflux.   HISTORY OF PRESENT ILLNESS:  She has been aware of reflux and was looking  forward to endoscopy which had to be deferred today by anesthesia because of  her wheezing.  She reports increased wheeze in the past two weeks, cough  productive of white, trace yellow sputum.  She is better this afternoon  after receiving several nebulizer treatments at the outpatient surgery  facility.  She too suspects reflux as an aggravating factor for her asthma  and understands that we do not have a lot of tools that are not being used   MEDICATIONS:  1. She continues Xolair injections 300 mg per month.  2. Allergy vaccine which she gets here.  3. Lipitor 10 mg.  4. Nasonex two puffs daily.  5. Advair 100/50.  6. Nexium 40 mg b.i.d.  7. Tranxene 7.5 mg.  8. Caltrate 600 mg with Vitamin D.  9. Lasix 40 mg.  10.Potassium.  11.Nebulizer with albuterol.  12.Albuterol inhaler.  13.Mucinex.  14.Twinject/epinephrine.   ALLERGIES:  1. Drug intolerant to PENICILLIN.  2. DOXYCYCLINE.  3. CLINDAMYCIN.  4. AUGMENTIN.  5. BIAXIN.   She failed to benefit from Singulair or theophylline.  She has a flutter  device at home for pulmonary toilet which she uses occasionally.   LABORATORY DATA:  Chest x-ray in February had shown chronic bronchitis  changes suspicious for bronchiectasis in the right lower lobe.  Earlier  films had shown some borderline cardiac enlargement, but she has not been in  overt heart failure.  Dr. Erik Obey had talked with her about sinus surgery  and has done her CTs, but she has  been trying to put that off.   OBJECTIVE:  VITAL SIGNS:  Weight 204 pounds, blood pressure 122/66, pulse  regular and 92, oxygen saturation at rest on room air 95%.  LUNGS:  Inspiratory and expiratory, rather coarse wheeze.  Some cough with  little sputum, mildly nasal.  HEART:  Heart sounds regular without murmur.  EXTREMITIES:  No edema or cyanosis.  NECK:  No adenopathy.   IMPRESSION:  Exacerbation of asthma with chronic bronchitis and  bronchiectasis on a background of disease which has been difficult all  summer.  We do need to try to settle the issue of reflux if possible.   PLAN:  Zyflo 600 mg q.i.d. was discussed.  We will get a chemistry profile  to check her liver and then begin Zyflo next week, giving samples.  Before  that she is going to complete a prednisone 8-day taper from 40 mg and Avelox  400 mg daily for seven days.  If we stay on Zyflo we will want liver  functions monthly x3  and then every 2-3 months for a year.  Scheduled to  return here in about three weeks.                                   Clinton D. Annamaria Boots, MD, [facility], FACP   CDY/MedQ  DD:  05/16/2006  DT:  05/16/2006  Job #:  [id number]   cc:   Cletis Athens, M.D.  Marikay Alar. Erik Obey, M.D.  Marcelino Duster, M.D.

## 2011-01-24 NOTE — Discharge Summary (Signed)
NAME:  [name], [name]                         ACCOUNT NO.:  [id number]   MEDICAL RECORD NO.:  [id number]                   PATIENT TYPE:  INP   LOCATION:  5709                                 FACILITY:  [facility]   PHYSICIAN:  Clinton D. Annamaria Boots, M.D.              DATE OF BIRTH:  Jul 21, 1934   DATE OF ADMISSION:  05/19/2002  DATE OF DISCHARGE:  06/04/2002                                 DISCHARGE SUMMARY   DISCHARGE DIAGNOSES:  1. Chronic obstructive pulmonary disease with acute asthmatic bronchitis.  2. Pneumonia, organism not specified.  3. Atelectasis, multilobar, due to thick retained secretions.  4. Chronic sinusitis, postoperative.  5. Hypoxic respiratory failure.  6. Gastroesophageal reflux disease.  7. Chronic anticoagulant therapy.  8. Lung nodules, stable.  9. Anxiety/depression.  10.      Allergic rhinitis.  11.      Hemorrhoids.  12.      Hypotension.   PROCEDURES:  1. Bronchoscopy with biopsy  2. 2-D echocardiogram.   BRIEF HISTORY:  The patient is a 76 year old woman, former smoker with  moderate COPD, admitted with right hilar infiltrate consistent with a  community-acquired pneumonia and a problem of recurrent bronchitis, cough,  and wheeze.   PAST MEDICAL HISTORY:  1. Recurrent bronchitis.  2. Gastroesophageal reflux disease.  3. Barrett's esophagus status post laparoscopic hiatal hernia repair May     2003.  4. Chronic polypoid sinusitis.  5. Allergic rhinitis.   DRUG INTOLERANCE:  CLINDAMYCIN, BIAXIN, AUGMENTIN (rash).   PHYSICAL EXAMINATION:  VITAL SIGNS:  On admission temperature 99.6, with 86%  saturation on room air and 90% saturation on 4 liters.  Weight 190 pounds.  LUNGS:  Wet bilateral rhonchi, mild dyspnea without cyanosis, adenopathy,  stridor, or JVD.  HEART:  Sounds were normal.  EXTREMITIES:  Calves were without cords or tenderness.   IMPRESSION:  Initial impression was likely pneumococcal pneumonia with post-  response blunted by  outpatient steroid therapy.   HOSPITAL COURSE:  She was begun on intravenous Tequin after blood cultures,  using Lovenox for DVT prophylaxis.  She continued to feel poorly.  CT scan  of the chest showed bilateral anterior upper lung zone atelectasis with some  endobronchial narrowing but no mass or pulmonary embolism.  Oxygen  requirement rose to 60% face mask.  Leukocytosis was not dramatic and search  for alternative diagnosis included assays for fungal infection and  vasculitis.  She was maintained on bronchodilators and Mucomyst as a  mucolytic agent.  In conference with the radiologist, he described her x-ray  appearance as bizarre.  Note was also made of three small lung nodules  which in comparison had been present and stable for at least two or three  years.  Bronchoscopy demonstrated thick rubbery impacted retained  secretions, especially in the upper zones which were difficult to lavage  free.  There was no evidence of mass.  She worked with  physical therapy on  mobilization and efforts were made to cut down on her steroids but these  needed to be re-addressed during weekend coverage.  First a Flutter  pulmonary toilet device, and then a pneumatic vest were added in a further  effort to mobilize secretions.  With little change on chest x-ray and  continued malaise, a second bronchoscopy using a large channel scope was  performed.  Vigorous hydration yielded copious clear mucoid impacted  secretions, and she subsequently began to improve.  There was concern that  cardiomegaly might reflect congestive heart failure contributing to her  symptoms and 2-D echocardiogram was done.  See results below.   BRONCHOSCOPY:  Bronchoscopy yielded benign bronchial mucosa with mild  chronic inflammation including rare eosinophils.  Special stains for fungal  organisms and for cytology were negative.   PULMONARY FUNCTION TESTING:  Moderate obstructive airways disease with  reduced FEC  suggesting air trapping.  Modest response to bronchodilator.   ECHOCARDIOGRAM:  Normal left ventricle size, normal LV systolic function  with ejection fraction of 55-65%, moderate prolapse anterior mitral leaflet,  mild to moderate dilatation of left atrium, normal right side.   CHEST X-RAY:  Severe bilateral upper lobe volume loss initially with final  film showing resolution right upper lobe atelectasis, persistent left lower  lobe air space process and effusion, small.   CT SCAN:  No evidence of PE.  There was bilateral upper lobe dense  atelectasis with filling defects, small bilateral effusions, loculated in  the apex.   CHEST X-RAY:  Shifting infiltrates and workup was done for allergic  bronchopulmonary aspergillosis with negative findings.   EKG:  Sinus rhythm, normal.   LABORATORY DATA:  Initial arterial blood gas on 2 liters showed pH 7.45,  pCO2 36, pO2 54.8, bicarbonate 25.  Subsequent scores remained low and at  least some were venous.  Initial white blood count 6,300, goes only to  9,100.  Hematocrits were mostly normal.  Sedimentation rate was 25.  Chemistry panel remarkable for initial glucose 119 and subsequently 211 on  steroids.  BUN 12, creatinine 0.8, albumin 2.8.  Liver enzymes normal.  B-  type natriuretic peptide was within normal at 82.  Studies for AFB were  negative as they were for fungus.  ANA titer returned 1:80, which was not  considered significant.  ANCA returned normal.   DISCHARGE PLANS:  She was discharged home with condition improved to return  to outpatient follow up as scheduled in one to two weeks.  She will follow  up with Dr. Amil Amen for Primary Care and with Dr. Sammuel Cooper as appropriate  related to her reflux and surgery.  She was to call me in one to two weeks  to let me know how she was doing and make decisions about outpatient follow  up.   DISCHARGE MEDICATIONS:  1. Nebulizer with albuterol q.i.d. p.r.n.  2. Nexium 40 mg b.i.d. 3.  Nasonex two sprays each nostril q.d.  4. Theophylline 200 mg b.i.d.  5. Colace 100 mg b.i.d. p.r.n.  6. Prednisone taper over eight days from 40 mg.  7. Darvocet-N 100 q.6h. p.r.n. pain (#50).  8. Tranxene 3.75 mg q.d. p.r.n., #30 refill x3.  9. Over the counter treatments for rectal hemorrhoids p.r.n.  10.      She was to stay off Zyrtec and Reglan thinking they may contribute     to very sticky, rubbery character of her secretions.   DIET:  Unrestricted but weight loss counseled.  Clinton D. Annamaria Boots, M.D.    CDY/MEDQ  D:  06/23/2002  T:  06/25/2002  Job:  [id number]   cc:   Marcelino Duster, M.D.  [place]  [place] [address]  Fax: [phone]

## 2011-01-24 NOTE — Discharge Summary (Signed)
. [facility]  Patient:    [name]                       MRN: [id number] Adm. Date:  [id number] Disc. Date: [id number] Attending:  Alfonso Patten Dictator:   Verdis Prime, M.D.                           Discharge Summary  DISCHARGE DIAGNOSES: 1.  Possible right lower lobe versus bronchitis with bronchospasm and hypoxemia. 2.  Chronic pansinusitis contributing to #1. 3.  Mild hyperglycemia. 4.  Chronic anxiety. 5.  Mild hypokalemia. 6.  Deep vein thrombosis ruled out during admission. 7.  Possible gastroesophageal reflux disease.  Barium swallow was normal with small     hiatal hernia (preliminary report).  Barium swallow possibly revealed findings     consistent with Barretts esophagitis (report not available at the time of this     dictation).  CONSULTANTS: 1.  Karol T. Erik Obey, M.D. (otolaryngologist). 2.  Meriel Flavors, M.D. (infectious disease). 3.  Clinton D. Annamaria Boots, M.D. (pulmonary).  HISTORY OF PRESENT ILLNESS:  [name] is a 77 year old white female who had chronic problems since July 2000 with pansinusitis.  She had been on multiple antibiotics, prednisone, antihistamines, decongestants, and expectorants.  She ad been followed by Dr. Erik Obey.  CT scan in October 2000 and revealed pansinusitis. In November 2000 chest x-ray revealed possible right lower lobe pneumonia. Over the seven days prior to admission she had more problems with cough, shortness of breath, wheezing, purulent sputum.  In the office the day of admission oxygen saturation was 85 per cent, with desaturation to 80 per cent with activity. She was admitted for more aggressive inpatient treatment.  HOSPITAL COURSE: #1 - CHRONIC PANSINUSITIS:  She was seen by her otolaryngologist.  Dr. Erik Obey noted she had tolerated Augmentin well for two weeks and then developed hives. She also had a rash on clindamycin.  Repeat CT scan a month later  still showed extensive sinus opacification.  He recommended continuing IV antibiotics to include anaerobic coverage.  He also suggested ID consultation.  He noted she may eventually need sinus surgery but he would like to achieve much better overall medical control before considering such surgical intervention.  During her hospitalization she was treated initially with IV Levaquin.  For better anaerobic coverage p.o. Flagyl was started on August 26, 1999.  Over her hospital course her congestion improved and antibiotics will be continued as an outpatient with  close follow-up with Dr. Erik Obey as an outpatient.  She may eventually need surgical intervention.  Of note, ANA and ANCA were normal during this hospitalization.  #2 - ASTHMATIC BRONCHITIS:  Initially she was admitted with possible right lower lobe pneumonia versus asthmatic bronchitis with bronchospasm and hypoxemia. She was treated aggressively with IV Levaquin, IV steroids, hand-held nebulizer. It was felt significant post nasal drainage was contributing to the bronchospasm.  With time and treatment, her symptoms slowly improved.  She continued to be short of breath with hypoxemia.  Pulmonary function tests revealed moderate restrictive disease.  CT of the chest revealed small nodular opacities in her lung basis, thought to represent either acute pneumonia or older scars.  Dr. Megan Salon was "under-whelmed" by her chest CT.  Over several days her cough persisted but gradually improved.  She was ultimately seen in consultation by Dr. Keturah Barre. He doubted significant immunoglobulin disorder.  He felt she was likely developing chronic self-sustaining bronchitis/bronchiectasis.  Also brought up the point we should always consider the possibility of pulmonary embolus.  For this reason lower extremity Dopplers were performed and were negative.  Pulmonary function tests revealed moderate restrictive disease.  By the time  of admission room air oxygen saturation was 94 per cent.  Dr. Annamaria Boots noted her hypoxemia was not quite qualifying for home oxygen.  On the day of discharge he noted he feels she has some previously silent COPD from old smoking, allergic rhinitis (old) and now sinusitis which may require surgical drainage by Dr. Erik Obey.  She had been draining sinus material and refluxing gastric acid to cause the bronchitis and pneumonitis pattern.  He doubts she has an immune disorder other than mucociliary impairment.  Suggested consider serum protein electrophoresis, immunoglobulin electrophoresis, and total IgE assays later if necessary.  He was more impressed by the lung nodules and noted a metastatic cancer or granulomatous disease could not be excluded entirely. This should be followed with repeat limited CT scan of the chest without contrast, to be done at [facility] in two months.  He will see her in follow-up early January 2001 to discuss bronchoscopy to evaluate airways and do transbronchial biopsies then.  #3 - HYPERGLYCEMIA:  She did experience mild hyperglycemia during this hospitalization.  She was on steroids throughout the entire hospitalization. Hemoglobin A1C was normal.  It was felt she had reactive hyperglycemia exacerbated by steroid treatment.  #4 - CHRONIC ANXIETY:  This is a long-standing problem.  She has done well as an outpatient over the past couple of years, especially since she has retired. She was treated with Tranxene p.r.n.  #5 - MILD HYPOKALEMIA:  Potassium was supplemented during this hospitalization. It was normal thereafter.  #6 - POSSIBLE BARRETTS ESOPHAGITIS:  Apparently this was noted on barium swallow but I do not have the final report at the time of this dictation.  Dr. Annamaria Boots indicated his concern for clinically significant reflux of gastric acid contributing to her bronchitis and pneumonitis.  He also notes she will probably need upper  endoscopy to confirm esophageal disease as an outpatient.  She also needs education about anti-reflux measures.  DISCHARGE CONDITION:  Improved.  DISCHARGE MEDICATIONS: 1.  Albuterol 2 puffs q.i.d. with spacer  2.  Flovent 2 puffs b.i.d. with spacer. 3.  Prednisone 40 mg q.d. for three days, then to wean by 5 mg daily until     discontinued.  She received clear written instructions about the dosing of     prednisone. 4.  Flagyl 500 mg t.i.d. for seven more days. 5.  Levaquin 500 mg q.d. for seven more days. 6.  Saline nasal flush t.i.d. 7.  Vancenase AQ nasal spray one spray to each nostril daily. 8.  Coated aspirin 1 tablet p.o. q.d. 9.  Calcium carbonate b.i.d. 10. Humibid LA b.i.d. 11. Raloxifene 60 mg q.d. 12. Lipitor 10 mg q.d. 13. Tranxene 3.75 mg b.i.d. p.r.n. 14. Prilosec 20 mg q.d.  She received pneumococcal and flu vaccines prior to discharge.  DISPOSITION:  She is to follow up with Dr. Amil Amen or Dr. Prudy Feeler in seven to ten days.  Additionally, to follow up with Dr. Keturah Barre in early January [id number], and Dr. Erik Obey in approximately two weeks.  Dr. Annamaria Boots notes she will need repeat limited CT scan of the chest without contrast at [facility] in two months to follow lung nodules.  DD:  10/17/99 TD:  10/17/99 Job: [id number]

## 2011-01-30 ENCOUNTER — Telehealth: Payer: Self-pay | Admitting: Internal Medicine

## 2011-01-30 MED ORDER — ALBUTEROL SULFATE (2.5 MG/3ML) 0.083% IN NEBU: 2.5000 mg | INHALATION_SOLUTION | RESPIRATORY_TRACT | Status: DC | PRN

## 2011-01-30 NOTE — Telephone Encounter (Signed)
Per CY ok to refill albuterol. Rx sent. Pt aware.[place] Bing, CMA

## 2011-01-30 NOTE — Telephone Encounter (Signed)
Pt is requesting a refill on albuterol nebulizer solution, but I do not see this on her active med list. Please advsie if ok to send in a refill. Thanks.[place] Bing, CMA

## 2011-02-13 ENCOUNTER — Ambulatory Visit (INDEPENDENT_AMBULATORY_CARE_PROVIDER_SITE_OTHER): Payer: Medicare Other | Admitting: *Deleted

## 2011-02-13 DIAGNOSIS — Z7901 Long term (current) use of anticoagulants: Secondary | ICD-10-CM

## 2011-02-13 DIAGNOSIS — I4891 Unspecified atrial fibrillation: Secondary | ICD-10-CM

## 2011-02-13 LAB — POCT INR: INR: 2

## 2011-02-17 ENCOUNTER — Other Ambulatory Visit: Payer: Self-pay

## 2011-02-17 MED ORDER — DIGOXIN 125 MCG PO TABS: 125.0000 ug | ORAL_TABLET | Freq: Every day | ORAL | Status: DC

## 2011-02-18 ENCOUNTER — Encounter: Payer: Self-pay | Admitting: Cardiology

## 2011-02-20 ENCOUNTER — Ambulatory Visit (INDEPENDENT_AMBULATORY_CARE_PROVIDER_SITE_OTHER): Payer: Medicare Other

## 2011-02-20 DIAGNOSIS — J45909 Unspecified asthma, uncomplicated: Secondary | ICD-10-CM

## 2011-02-21 ENCOUNTER — Other Ambulatory Visit: Payer: Self-pay

## 2011-02-21 MED ORDER — DIGOXIN 125 MCG PO TABS: 125.0000 ug | ORAL_TABLET | Freq: Every day | ORAL | Status: DC

## 2011-02-24 ENCOUNTER — Encounter: Payer: Self-pay | Admitting: Internal Medicine

## 2011-02-25 MED ORDER — OMALIZUMAB 150 MG ~~LOC~~ SOLR
300.0000 mg | Freq: Once | SUBCUTANEOUS | Status: AC
  Administered 2011-02-25: 300 mg via SUBCUTANEOUS

## 2011-03-05 ENCOUNTER — Ambulatory Visit (INDEPENDENT_AMBULATORY_CARE_PROVIDER_SITE_OTHER): Payer: Medicare Other | Admitting: Cardiology

## 2011-03-05 ENCOUNTER — Encounter: Payer: Self-pay | Admitting: Cardiology

## 2011-03-05 VITALS — BP 111/74 | HR 67 | Ht 66.0 in | Wt 158.0 lb

## 2011-03-05 DIAGNOSIS — I059 Rheumatic mitral valve disease, unspecified: Secondary | ICD-10-CM

## 2011-03-05 DIAGNOSIS — I4891 Unspecified atrial fibrillation: Secondary | ICD-10-CM

## 2011-03-05 DIAGNOSIS — I080 Rheumatic disorders of both mitral and aortic valves: Secondary | ICD-10-CM

## 2011-03-05 DIAGNOSIS — I429 Cardiomyopathy, unspecified: Secondary | ICD-10-CM

## 2011-03-05 NOTE — Patient Instructions (Signed)
**Note De-identified [name] Obfuscation** Your physician recommends that you continue on your current medications as directed. Please refer to the Current Medication list given to you today.  Your physician recommends that you schedule a follow-up appointment in: 3 months  

## 2011-03-05 NOTE — Assessment & Plan Note (Signed)
Heart rate adequately controlled today on present medical therapy. Continue anticoagulation as well.

## 2011-03-05 NOTE — Assessment & Plan Note (Signed)
Recently graded as only mild to moderate by followup echocardiogram on medical therapy. Continue observation.

## 2011-03-05 NOTE — Assessment & Plan Note (Signed)
Mild to moderate with restricted posterior leaflet.

## 2011-03-05 NOTE — Progress Notes (Signed)
Clinical Summary [name] is a 77 y.o.female presenting for followup. She was seen in May.  Followup echocardiogram is noted below, showing significant improvements in LVEF and also mitral regurgitation. We discussed this today.  Symptomatically she remains stable, reporting NYHA class II dyspnea on exertion, no significant palpitations. No orthopnea or progressive lower extremity edema.   Allergies  Allergen Reactions  . Clarithromycin   . Clindamycin   . Doxycycline   . [id number] Acid+Aspartame   . Penicillins     Current outpatient prescriptions:albuterol (PROAIR HFA) 108 (90 BASE) MCG/ACT inhaler, Inhale 1-2 puffs into the lungs. Every 4-6 hours as needed. , Disp: , Rfl: ;  albuterol (PROVENTIL) (2.5 MG/3ML) 0.083% nebulizer solution, Take 3 mLs (2.5 mg total) by nebulization every 4 (four) hours as needed for wheezing., Disp: 120 mL, Rfl: 12;  atorvastatin (LIPITOR) 10 MG tablet, Take 10 mg by mouth daily.  , Disp: , Rfl:  Calcium Carbonate-Vitamin D (CALTRATE 600+D) 600-400 MG-UNIT per tablet, Take 1 tablet by mouth daily.  , Disp: , Rfl: ;  calcium-vitamin D 185 MG TABS, Take 185 mg by mouth daily.  , Disp: , Rfl: ;  citalopram (CELEXA) 20 MG tablet, Take 20 mg by mouth daily. , Disp: , Rfl: ;  diazepam (VALIUM) 2 MG tablet, Take 2 mg by mouth daily. , Disp: , Rfl:  digoxin (LANOXIN) 0.125 MG tablet, Take 1 tablet (125 mcg total) by mouth daily., Disp: 90 tablet, Rfl: 0;  diltiazem (CARDIZEM CD) 240 MG 24 hr capsule, Take 240 mg by mouth daily.  , Disp: , Rfl: ;  EPINEPHrine (EPI-PEN) 0.3 MG/0.3ML DEVI, Inject 0.3 mg into the muscle as needed.  , Disp: , Rfl: ;  esomeprazole (NEXIUM) 40 MG capsule, Take 40 mg by mouth 2 (two) times daily.  , Disp: , Rfl:  furosemide (LASIX) 40 MG tablet, Take 1 tablet (40 mg total) by mouth daily., Disp: 30 tablet, Rfl: 3;  mometasone (NASONEX) 50 MCG/ACT nasal spray, 2 sprays by Nasal route daily.  , Disp: , Rfl: ;  omalizumab  (XOLAIR) 150 MG injection, 300 mg once every 28 days, Disp: 900 mg, Rfl: 3;  potassium chloride SA (KLOR-CON M20) 20 MEQ tablet, Take 20 mEq by mouth daily.  , Disp: , Rfl:  warfarin (COUMADIN) 5 MG tablet, Or as directed by anticoagulation clinic, Disp: 100 tablet, Rfl: 1  Past Medical History  Diagnosis Date  . Asthma   . Atrial fibrillation   . COPD (chronic obstructive pulmonary disease)   . GERD (gastroesophageal reflux disease)   . Mitral valve prolapse   . Nonischemic cardiomyopathy     LVEF 25-30%  . Chronic rhinitis   . Obstructive sleep apnea   . Transudative pleural effusion   . Nasal polyps   . Mitral regurgitation     Moderate to severe 1/12    Past Surgical History  Procedure Date  . Laparoscopic nissen fundoplication 123XX123  . Appendectomy 2000  . Nose surgery   . Bilateral cataract surgery     Family History  Problem Relation Age of Onset  . Atrial fibrillation Sister   . Cancer Brother     Lung  . Cancer Brother     Lung  . Cancer Father     Colon  . Cancer Mother     Colon    Social History [name] reports that she quit smoking about 30 years ago. Her smoking use included Cigarettes. She has never used smokeless tobacco. [name]  reports that she does not drink alcohol.  Review of Systems No reported bleeding problems. Stable appetite. Some trouble with "allergies." Otherwise negative.  Physical Examination Filed Vitals:   03/05/11 0849  BP: 111/74  Pulse: 67  Normally nourished appearing elderly woman in no acute distress.  HEENT: Lids normal, conjunctival redness right greater than left, oropharynx clear with moist mucosa.  Neck: Supple, no elevated JVP or carotid bruits, no thyromegaly.  Lungs: Diminished but clear breath sounds, no wheezing or labored breathing.  Cardiac: Irregularly irregular, soft apical systolic murmur, no pericardial rub.  Abdomen: Soft, nontender, bowel sound present.  Skin: Warm and dry.  Extremities: Trace  edema below the knees, distal pulses 1+.  Musculoskeletal: No kyphosis.  Neuropsychiatric: Alert and oriented x3, affect appropriate.   Studies Echocardiogram 01/21/2011: - Left ventricle: The cavity size was normal. Wall thickness was     normal. Systolic function was mildly reduced. The estimated     ejection fraction was in the range of 45% to 50%. Wall motion was     normal; there were no regional wall motion abnormalities.   - Aortic valve: Trivial regurgitation.   - Mitral valve: Calcified annulus. Mildly thickened leaflets . Mild     to moderate restriction of mobility of the posterior leaflet;     possibly rheumatic valve. No stenosis. Mild to moderate prolapse.     Mild to moderate regurgitation.   - Left atrium: The atrium was moderately dilated.   - Right atrium: The atrium was mildly dilated.   - Atrial septum: No defect or patent foramen ovale was identified.   - Pericardium, extracardiac: A trivial pericardial effusion was     identified posterior to the heart.  Problem List and Plan

## 2011-03-05 NOTE — Assessment & Plan Note (Signed)
Nonischemic, most likely tachycardia mediated cardiomyopathy. LVEF has improved significantly on medical therapy and she remains clinically stable at this point. No changes made today to her regimen.

## 2011-03-10 ENCOUNTER — Other Ambulatory Visit: Payer: Self-pay | Admitting: Cardiology

## 2011-03-13 ENCOUNTER — Ambulatory Visit (INDEPENDENT_AMBULATORY_CARE_PROVIDER_SITE_OTHER): Payer: Medicare Other | Admitting: *Deleted

## 2011-03-13 DIAGNOSIS — I4891 Unspecified atrial fibrillation: Secondary | ICD-10-CM

## 2011-03-13 DIAGNOSIS — Z7901 Long term (current) use of anticoagulants: Secondary | ICD-10-CM

## 2011-03-13 LAB — POCT INR: INR: 2.3

## 2011-03-21 ENCOUNTER — Encounter: Payer: Self-pay | Admitting: Internal Medicine

## 2011-03-21 ENCOUNTER — Ambulatory Visit (INDEPENDENT_AMBULATORY_CARE_PROVIDER_SITE_OTHER): Payer: Medicare Other | Admitting: Internal Medicine

## 2011-03-21 VITALS — BP 102/76 | HR 75 | Ht 67.0 in | Wt 158.0 lb

## 2011-03-21 DIAGNOSIS — J45909 Unspecified asthma, uncomplicated: Secondary | ICD-10-CM

## 2011-03-21 MED ORDER — TIOTROPIUM BROMIDE MONOHYDRATE 18 MCG IN CAPS: 18.0000 ug | ORAL_CAPSULE | Freq: Every day | RESPIRATORY_TRACT | Status: DC

## 2011-03-21 NOTE — Patient Instructions (Signed)
Sample Spiriva, one daily. Fill script if it helps your breathing

## 2011-03-21 NOTE — Assessment & Plan Note (Addendum)
PFT supports impression of a chronic obstructive disease with significant response to bronchodilator. I don't hear wheese and she feel little need for her rescue inhaler. She has had several long acting maintenance meds in the past. We will avoid stimulants, but retry Spiriva.

## 2011-03-21 NOTE — Progress Notes (Signed)
  Subjective:    Patient ID: [name], female    DOB: [birth year], 77 y.o.   MRN: [id number]  HPI 7/113/12-  76 yoF former smoker, followed for Chronic asthma/ COPD, chronic rhinosinusitis, complicated by hx AFib/ anticoagulation, CHF, cardiomyopathy, mitral insufficiency, GERD Last here December 11/19/10- note reviewed She has felt well controlled with no acute breathing issues. She saw Dr Domenic Polite for cardiac f/u 2 weeks ago and Echo was good with no med changes. Uses rescue inhaler only occasionally, usually on first rising. Little wheeze at times, much better. Little DOE with ADLs PFT 12/06/10= chronic obstructive asthma   Review of Systems     Objective:   Physical Exam General- Alert, Oriented, Affect-appropriate, Distress- none acute cal m, comfortable appearing  Skin- rash-none, lesions- none, excoriation- none  Lymphadenopathy- none  Head- atraumatic  Eyes- Gross vision intact, PERRLA, conjunctivae clear secretions  Ears- Hearing, canals, Tm- normal  Nose- Clear, No- Septal dev, mucus, polyps, erosion, perforation   Throat- Mallampati II , mucosa clear , drainage- none, tonsils- atrophic  Neck- flexible , trachea midline, no stridor , thyroid nl, carotid no bruit  Chest - symmetrical excursion , unlabored     Heart/CV- irregular/ AFib , no murmur , no gallop  , no rub, nl s1 s2                     - JVD- none , edema- none, stasis changes- none, varices- none     Lung- clear to P&A, wheeze- none, cough- none , dullness-none, rub- none     Chest wall-   Abd- tender-no, distended-no, bowel sounds-present, HSM- no  Br/ Gen/ Rectal- Not done, not indicated  Extrem- cyanosis- none, clubbing, none, atrophy- none, strength- nl  Neuro- grossly intact to observation         Assessment & Plan:

## 2011-03-24 ENCOUNTER — Ambulatory Visit (INDEPENDENT_AMBULATORY_CARE_PROVIDER_SITE_OTHER): Payer: Medicare Other

## 2011-03-24 DIAGNOSIS — J45909 Unspecified asthma, uncomplicated: Secondary | ICD-10-CM

## 2011-03-24 MED ORDER — OMALIZUMAB 150 MG ~~LOC~~ SOLR
300.0000 mg | Freq: Once | SUBCUTANEOUS | Status: AC
  Administered 2011-03-24: 300 mg via SUBCUTANEOUS

## 2011-03-27 ENCOUNTER — Telehealth: Payer: Self-pay | Admitting: Internal Medicine

## 2011-03-27 MED ORDER — MOXIFLOXACIN HCL 400 MG PO TABS: 400.0000 mg | ORAL_TABLET | Freq: Every day | ORAL | Status: AC

## 2011-03-27 NOTE — Telephone Encounter (Signed)
Per CY-okay to give Avelox 400mg  #7 take 1 po daily no refills.

## 2011-03-27 NOTE — Telephone Encounter (Signed)
Spoke with the pt and she is c/o sinus congestion, facial pain and pressure, blowing yellow mucus from nose, dry cough all x 2 days. She denies fever or SOB. She has not tried anything OTC. Pt last seen 03-21-11.  Please advise. [place] Bing, CMA Allergies  Allergen Reactions  . Clarithromycin   . Clindamycin   . Doxycycline   . [id number] Acid+Aspartame   . Penicillins

## 2011-03-27 NOTE — Telephone Encounter (Signed)
Pt is aware of rx for Avelox and this has been called to [facility] in [place].

## 2011-04-10 ENCOUNTER — Ambulatory Visit (INDEPENDENT_AMBULATORY_CARE_PROVIDER_SITE_OTHER): Payer: Medicare Other | Admitting: *Deleted

## 2011-04-10 DIAGNOSIS — Z7901 Long term (current) use of anticoagulants: Secondary | ICD-10-CM

## 2011-04-10 DIAGNOSIS — I4891 Unspecified atrial fibrillation: Secondary | ICD-10-CM

## 2011-04-24 ENCOUNTER — Ambulatory Visit (INDEPENDENT_AMBULATORY_CARE_PROVIDER_SITE_OTHER): Payer: Medicare Other

## 2011-04-24 DIAGNOSIS — J45909 Unspecified asthma, uncomplicated: Secondary | ICD-10-CM

## 2011-04-24 MED ORDER — OMALIZUMAB 150 MG ~~LOC~~ SOLR
300.0000 mg | Freq: Once | SUBCUTANEOUS | Status: AC
  Administered 2011-04-24: 300 mg via SUBCUTANEOUS

## 2011-05-08 ENCOUNTER — Ambulatory Visit (INDEPENDENT_AMBULATORY_CARE_PROVIDER_SITE_OTHER): Payer: Medicare Other | Admitting: *Deleted

## 2011-05-08 DIAGNOSIS — Z7901 Long term (current) use of anticoagulants: Secondary | ICD-10-CM

## 2011-05-08 DIAGNOSIS — I4891 Unspecified atrial fibrillation: Secondary | ICD-10-CM

## 2011-05-22 ENCOUNTER — Telehealth: Payer: Self-pay | Admitting: Internal Medicine

## 2011-05-22 ENCOUNTER — Encounter: Payer: Self-pay | Admitting: *Deleted

## 2011-05-22 DIAGNOSIS — J449 Chronic obstructive pulmonary disease, unspecified: Secondary | ICD-10-CM

## 2011-05-22 NOTE — Telephone Encounter (Signed)
Nothing in EPIC or Centricity regarding PNA.  Requesting pt's paper chart.

## 2011-05-26 ENCOUNTER — Ambulatory Visit: Payer: BC Managed Care – PPO

## 2011-05-26 MED ORDER — ALBUTEROL SULFATE HFA 108 (90 BASE) MCG/ACT IN AERS: 1.0000 | INHALATION_SPRAY | Freq: Four times a day (QID) | RESPIRATORY_TRACT | Status: DC | PRN

## 2011-05-26 NOTE — Telephone Encounter (Signed)
Pt stated she needs refills on her nebulizer & her hand-held inhaler.  Pt stated she needs [facility] [place].  PT can be reached at [phone]. [name]

## 2011-05-26 NOTE — Telephone Encounter (Signed)
Reviewed paper chart and still unable to locate date her of last pneumovax. LMTCB.

## 2011-05-26 NOTE — Telephone Encounter (Signed)
Spoke with pt and notified no record of pneumovax here. Also she states needs order sent to [facility] for new mouthpieces and tubing for nebs. Also needs refill on her proair. Order sent to [facility] and rx for proair refilled.

## 2011-05-28 ENCOUNTER — Ambulatory Visit (INDEPENDENT_AMBULATORY_CARE_PROVIDER_SITE_OTHER): Payer: Medicare Other

## 2011-05-28 DIAGNOSIS — J45909 Unspecified asthma, uncomplicated: Secondary | ICD-10-CM

## 2011-05-28 MED ORDER — OMALIZUMAB 150 MG ~~LOC~~ SOLR
300.0000 mg | Freq: Once | SUBCUTANEOUS | Status: AC
  Administered 2011-05-28: 300 mg via SUBCUTANEOUS

## 2011-05-29 ENCOUNTER — Ambulatory Visit: Payer: BC Managed Care – PPO

## 2011-06-03 ENCOUNTER — Encounter: Payer: Self-pay | Admitting: Cardiology

## 2011-06-05 ENCOUNTER — Encounter: Payer: BC Managed Care – PPO | Admitting: *Deleted

## 2011-06-05 ENCOUNTER — Ambulatory Visit (INDEPENDENT_AMBULATORY_CARE_PROVIDER_SITE_OTHER): Payer: Medicare Other | Admitting: *Deleted

## 2011-06-05 ENCOUNTER — Ambulatory Visit: Payer: BC Managed Care – PPO | Admitting: Cardiology

## 2011-06-05 ENCOUNTER — Encounter: Payer: Self-pay | Admitting: Cardiology

## 2011-06-05 ENCOUNTER — Ambulatory Visit (INDEPENDENT_AMBULATORY_CARE_PROVIDER_SITE_OTHER): Payer: Medicare Other | Admitting: Cardiology

## 2011-06-05 VITALS — BP 112/71 | HR 58 | Resp 18 | Ht 67.0 in | Wt 143.0 lb

## 2011-06-05 DIAGNOSIS — I429 Cardiomyopathy, unspecified: Secondary | ICD-10-CM

## 2011-06-05 DIAGNOSIS — I4891 Unspecified atrial fibrillation: Secondary | ICD-10-CM

## 2011-06-05 DIAGNOSIS — Z7901 Long term (current) use of anticoagulants: Secondary | ICD-10-CM

## 2011-06-05 DIAGNOSIS — I080 Rheumatic disorders of both mitral and aortic valves: Secondary | ICD-10-CM

## 2011-06-05 MED ORDER — DIGOXIN 125 MCG PO TABS: 125.0000 ug | ORAL_TABLET | Freq: Every day | ORAL | Status: DC

## 2011-06-05 NOTE — Assessment & Plan Note (Signed)
Nonischemic cardiomyopathy, LVEF improved up to the range of 45-50% on medical therapy. She is symptomatically stable, no changes made today.

## 2011-06-05 NOTE — Assessment & Plan Note (Signed)
Persistent, but rate controlled, tolerating Coumadin.

## 2011-06-05 NOTE — Assessment & Plan Note (Signed)
Mild to moderate by most recent assessment, associated with mitral valve prolapse.

## 2011-06-05 NOTE — Progress Notes (Signed)
Clinical Summary [name] is a 77 y.o.female presenting for followup. She was seen in June.  She continues to do fairly well, stable NYHA class II dyspnea on exertion. No palpitations, no major bleeding problems with Coumadin. She has had occasional bruising.  She states she is moving to a different home with family, still in the area. Reports stable appetite. Feels better in general with weight loss.   Allergies  Allergen Reactions  . Clarithromycin   . Clindamycin   . Doxycycline   . [id number] Acid+Aspartame   . Penicillins     Medication list reviewed.  Past Medical History  Diagnosis Date  . Asthma   . Atrial fibrillation   . COPD (chronic obstructive pulmonary disease)   . GERD (gastroesophageal reflux disease)   . Mitral valve prolapse   . Nonischemic cardiomyopathy     LVEF 25-30% improved to 45-50%  . Chronic rhinitis   . Obstructive sleep apnea   . Transudative pleural effusion   . Nasal polyps   . Mitral regurgitation     Mild to moderate 5/12    Past Surgical History  Procedure Date  . Laparoscopic nissen fundoplication 123XX123  . Appendectomy 2000  . Nose surgery   . Bilateral cataract surgery     Family History  Problem Relation Age of Onset  . Atrial fibrillation Sister   . Cancer Brother     Lung  . Cancer Brother     Lung  . Cancer Father     Colon  . Cancer Mother     Colon    Social History [name] reports that she quit smoking about 30 years ago. Her smoking use included Cigarettes. She has never used smokeless tobacco. [name] reports that she does not drink alcohol.  Review of Systems No orthopnea or PND, no significant palpitations, no edema. She has had some allergy problems. Otherwise negative.  Physical Examination Filed Vitals:   06/05/11 1001  BP: 112/71  Pulse: 58  Resp: 18  Normally nourished appearing elderly woman in no acute distress.  HEENT: Lids normal, conjunctiva normal,  oropharynx clear with moist mucosa.  Neck: Supple, no elevated JVP or carotid bruits, no thyromegaly.  Lungs: Diminished but clear breath sounds, no wheezing or labored breathing.  Cardiac: Irregularly irregular, soft apical systolic murmur, no pericardial rub.  Abdomen: Soft, nontender, bowel sound present.  Skin: Warm and dry.  Extremities: Trace edema below the knees, distal pulses 1+.  Musculoskeletal: No kyphosis.  Neuropsychiatric: Alert and oriented x3, affect appropriate.     Problem List and Plan

## 2011-06-05 NOTE — Patient Instructions (Signed)
Your physician recommends that you schedule a follow-up appointment in: 4 months  

## 2011-06-23 ENCOUNTER — Other Ambulatory Visit: Payer: Self-pay | Admitting: *Deleted

## 2011-06-23 MED ORDER — WARFARIN SODIUM 5 MG PO TABS: ORAL_TABLET | ORAL | Status: DC

## 2011-06-25 ENCOUNTER — Ambulatory Visit (INDEPENDENT_AMBULATORY_CARE_PROVIDER_SITE_OTHER): Payer: Medicare Other

## 2011-06-25 DIAGNOSIS — J45909 Unspecified asthma, uncomplicated: Secondary | ICD-10-CM

## 2011-06-26 DIAGNOSIS — J45909 Unspecified asthma, uncomplicated: Secondary | ICD-10-CM

## 2011-06-26 MED ORDER — OMALIZUMAB 150 MG ~~LOC~~ SOLR
300.0000 mg | Freq: Once | SUBCUTANEOUS | Status: AC
  Administered 2011-06-26: 300 mg via SUBCUTANEOUS

## 2011-06-27 ENCOUNTER — Ambulatory Visit: Payer: BC Managed Care – PPO

## 2011-06-30 ENCOUNTER — Other Ambulatory Visit (HOSPITAL_COMMUNITY): Payer: Self-pay | Admitting: Family Medicine

## 2011-06-30 DIAGNOSIS — Z139 Encounter for screening, unspecified: Secondary | ICD-10-CM

## 2011-07-03 ENCOUNTER — Ambulatory Visit (INDEPENDENT_AMBULATORY_CARE_PROVIDER_SITE_OTHER): Payer: Medicare Other | Admitting: *Deleted

## 2011-07-03 ENCOUNTER — Ambulatory Visit (HOSPITAL_COMMUNITY)
Admission: RE | Admit: 2011-07-03 | Discharge: 2011-07-03 | Disposition: A | Discharge status: Home / Self Care | Payer: Medicare Other | Source: Ambulatory Visit | Attending: Family Medicine | Admitting: Family Medicine

## 2011-07-03 DIAGNOSIS — Z139 Encounter for screening, unspecified: Secondary | ICD-10-CM

## 2011-07-03 DIAGNOSIS — M899 Disorder of bone, unspecified: Secondary | ICD-10-CM | POA: Insufficient documentation

## 2011-07-03 DIAGNOSIS — Z780 Asymptomatic menopausal state: Secondary | ICD-10-CM | POA: Insufficient documentation

## 2011-07-03 DIAGNOSIS — I4891 Unspecified atrial fibrillation: Secondary | ICD-10-CM

## 2011-07-03 DIAGNOSIS — Z1231 Encounter for screening mammogram for malignant neoplasm of breast: Secondary | ICD-10-CM | POA: Insufficient documentation

## 2011-07-03 DIAGNOSIS — M949 Disorder of cartilage, unspecified: Secondary | ICD-10-CM | POA: Insufficient documentation

## 2011-07-03 DIAGNOSIS — Z7901 Long term (current) use of anticoagulants: Secondary | ICD-10-CM

## 2011-07-16 ENCOUNTER — Telehealth: Payer: Self-pay | Admitting: Internal Medicine

## 2011-07-16 MED ORDER — MOXIFLOXACIN HCL 400 MG PO TABS: 400.0000 mg | ORAL_TABLET | Freq: Every day | ORAL | Status: DC

## 2011-07-16 NOTE — Telephone Encounter (Signed)
Pt c/o sinus congestion and cough with yellow mucus. Wheezing.  She denies any dyspnea, f/c/s. All symptoms x 2-3 days. [facility] [place]. Pls advise. Allergies  Allergen Reactions  . Clarithromycin   . Clindamycin   . Doxycycline   . [id number] Acid+Aspartame   . Penicillins

## 2011-07-16 NOTE — Telephone Encounter (Signed)
Spoke with pt and notified that we are prescribing avelox for her. She verbalized understanding and states no further questions. Rx was sent to pharm.

## 2011-07-16 NOTE — Telephone Encounter (Signed)
Per CY-okay to give Avelox 400 mg #5 take 1 by mouth daily no refills.  

## 2011-07-24 ENCOUNTER — Ambulatory Visit (INDEPENDENT_AMBULATORY_CARE_PROVIDER_SITE_OTHER): Payer: Medicare Other | Admitting: *Deleted

## 2011-07-24 DIAGNOSIS — I4891 Unspecified atrial fibrillation: Secondary | ICD-10-CM

## 2011-07-24 DIAGNOSIS — Z7901 Long term (current) use of anticoagulants: Secondary | ICD-10-CM

## 2011-07-24 LAB — POCT INR: INR: 2.3

## 2011-07-25 ENCOUNTER — Encounter: Payer: Self-pay | Admitting: Internal Medicine

## 2011-07-25 ENCOUNTER — Ambulatory Visit (INDEPENDENT_AMBULATORY_CARE_PROVIDER_SITE_OTHER): Payer: Medicare Other

## 2011-07-25 ENCOUNTER — Ambulatory Visit (INDEPENDENT_AMBULATORY_CARE_PROVIDER_SITE_OTHER): Payer: Medicare Other | Admitting: Internal Medicine

## 2011-07-25 VITALS — BP 110/68 | HR 57 | Ht 67.0 in | Wt 143.4 lb

## 2011-07-25 DIAGNOSIS — J42 Unspecified chronic bronchitis: Secondary | ICD-10-CM

## 2011-07-25 DIAGNOSIS — J45909 Unspecified asthma, uncomplicated: Secondary | ICD-10-CM

## 2011-07-25 MED ORDER — MOXIFLOXACIN HCL 400 MG PO TABS: 400.0000 mg | ORAL_TABLET | Freq: Every day | ORAL | Status: AC

## 2011-07-25 NOTE — Patient Instructions (Signed)
Script sent for Avelox  Your weight now is good. We don't want you to loose too much, so if it keeps going down, speak to Dr Hilma Favors.  Continue your usual meds.

## 2011-07-25 NOTE — Progress Notes (Signed)
Patient ID: [name], female    DOB: 01-22-1934, 77 y.o.   MRN: [id number]  HPI 7/113/12-  76 yoF former smoker, followed for Chronic asthma/ COPD, chronic rhinosinusitis, complicated by hx AFib/ anticoagulation, CHF, cardiomyopathy, mitral insufficiency, GERD Last here December 11/19/10- note reviewed She has felt well controlled with no acute breathing issues. She saw Dr Domenic Polite for cardiac f/u 2 weeks ago and Echo was good with no med changes. Uses rescue inhaler only occasionally, usually on first rising. Little wheeze at times, much better. Little DOE with ADLs PFT 12/06/10= chronic obstructive asthma  09/23/10- 7/113/12-  76 yoF former smoker, followed for Chronic asthma/ COPD, chronic rhinosinusitis, complicated by hx AFib/ anticoagulation, CHF, cardiomyopathy, mitral insufficiency, GERD Has had flu vaccine. Persistent head and chest congestion for the past 2 weeks. On November 7 we sent  Avelox for 5 days. This helped but didn't last long enough. She took a leftover prednisone taper but wants to avoid more prednisone now if possible. She has noted mild tremor in her hands for the past month no lateralizing finding. She continues Xolair injections. Using nebulizer or rescue inhaler only every 1 or 2 days. Has lost some weight and admits she has been dieting some but otherwise she's not sure that explains it. Thyroid function test normal in the past.   Review of Systems-see HPI Constitutional:   +  weight loss, night sweats, fevers, chills, fatigue, lassitude. HEENT:   No-  headaches, difficulty swallowing, tooth/dental problems, sore throat,       No-  sneezing, itching, ear ache, +nasal congestion, post nasal drip,  CV:  No-   chest pain, orthopnea, PND, swelling in lower extremities, anasarca,                                  dizziness, palpitations Resp: No-   shortness of breath with exertion or at rest.              No-   productive cough,  + non-productive cough,  No- coughing  up of blood.              No-   change in color of mucus.  No- wheezing.   Skin: No-   rash or lesions. GI:  No-   heartburn, indigestion, abdominal pain, nausea, vomiting, diarrhea,                 change in bowel habits, loss of appetite GU: No-   dysuria, change in color of urine, no urgency or frequency.  No- flank pain. MS:  No-   joint pain or swelling.  No- decreased range of motion.  No- back pain. Neuro-     nothing unusual Psych:  No- change in mood or affect. No depression or anxiety.  No memory loss.       Objective:   Physical Exam General- Alert, Oriented, Affect-appropriate, Distress- none acute, thin Skin- rash-none, lesions- none, excoriation- none Lymphadenopathy- none Head- atraumatic            Eyes- Gross vision intact, PERRLA, conjunctivae clear secretions            Ears- Hearing, canals-normal            Nose- turbinate edema, no-Septal dev, mucus, polyps, erosion, perforation             Throat- Mallampati II , mucosa clear , drainage- none, tonsils-  atrophic Neck- flexible , trachea midline, no stridor , thyroid nl, carotid no bruit Chest - symmetrical excursion , unlabored           Heart/CV- IRR/ controlled , no murmur , no gallop  , no rub, nl s1 s2                           - JVD- none , edema- none, stasis changes- none, varices- none           Lung- coarse rhonchi, wheeze- none, cough- none , dullness-none, rub- none           Chest wall-  Abd- tender-no, distended-no, bowel sounds-present, HSM- no Br/ Gen/ Rectal- Not done, not indicated Extrem- cyanosis- none, clubbing, none, atrophy- none, strength- nl Neuro- slight resting tremor

## 2011-07-28 NOTE — Assessment & Plan Note (Signed)
Chronic bronchitis with exacerbation. We're going to extend her Avelox treatment longer see if that will cure it. We discussed interaction between antibiotics and Coumadin.

## 2011-07-29 ENCOUNTER — Other Ambulatory Visit: Payer: Self-pay | Admitting: *Deleted

## 2011-07-29 DIAGNOSIS — J45909 Unspecified asthma, uncomplicated: Secondary | ICD-10-CM

## 2011-07-29 MED ORDER — FUROSEMIDE 40 MG PO TABS: 40.0000 mg | ORAL_TABLET | Freq: Every day | ORAL | Status: DC

## 2011-07-29 MED ORDER — OMALIZUMAB 150 MG ~~LOC~~ SOLR
300.0000 mg | Freq: Once | SUBCUTANEOUS | Status: AC
  Administered 2011-07-29: 300 mg via SUBCUTANEOUS

## 2011-08-21 ENCOUNTER — Ambulatory Visit (INDEPENDENT_AMBULATORY_CARE_PROVIDER_SITE_OTHER): Payer: Medicare Other | Admitting: *Deleted

## 2011-08-21 ENCOUNTER — Other Ambulatory Visit: Payer: Self-pay | Admitting: *Deleted

## 2011-08-21 DIAGNOSIS — Z7901 Long term (current) use of anticoagulants: Secondary | ICD-10-CM

## 2011-08-21 DIAGNOSIS — I4891 Unspecified atrial fibrillation: Secondary | ICD-10-CM

## 2011-08-21 LAB — POCT INR: INR: 2.1

## 2011-08-21 MED ORDER — OMALIZUMAB 150 MG ~~LOC~~ SOLR: SUBCUTANEOUS | Status: DC

## 2011-08-28 ENCOUNTER — Ambulatory Visit (INDEPENDENT_AMBULATORY_CARE_PROVIDER_SITE_OTHER): Payer: Medicare Other

## 2011-08-28 DIAGNOSIS — J45909 Unspecified asthma, uncomplicated: Secondary | ICD-10-CM

## 2011-08-28 MED ORDER — OMALIZUMAB 150 MG ~~LOC~~ SOLR
300.0000 mg | Freq: Once | SUBCUTANEOUS | Status: AC
  Administered 2011-08-28: 300 mg via SUBCUTANEOUS

## 2011-08-29 ENCOUNTER — Ambulatory Visit: Payer: Medicare Other

## 2011-09-16 ENCOUNTER — Other Ambulatory Visit: Payer: Self-pay | Admitting: *Deleted

## 2011-09-16 MED ORDER — DILTIAZEM HCL ER COATED BEADS 240 MG PO CP24: 240.0000 mg | ORAL_CAPSULE | Freq: Every day | ORAL | Status: DC

## 2011-09-24 ENCOUNTER — Ambulatory Visit (INDEPENDENT_AMBULATORY_CARE_PROVIDER_SITE_OTHER): Payer: BC Managed Care – PPO | Admitting: Cardiology

## 2011-09-24 ENCOUNTER — Encounter: Payer: Self-pay | Admitting: Cardiology

## 2011-09-24 ENCOUNTER — Ambulatory Visit (INDEPENDENT_AMBULATORY_CARE_PROVIDER_SITE_OTHER): Payer: Medicare Other | Admitting: *Deleted

## 2011-09-24 VITALS — BP 99/59 | HR 69 | Resp 16 | Ht 67.0 in | Wt 144.0 lb

## 2011-09-24 DIAGNOSIS — I429 Cardiomyopathy, unspecified: Secondary | ICD-10-CM

## 2011-09-24 DIAGNOSIS — Z7901 Long term (current) use of anticoagulants: Secondary | ICD-10-CM

## 2011-09-24 DIAGNOSIS — I059 Rheumatic mitral valve disease, unspecified: Secondary | ICD-10-CM

## 2011-09-24 DIAGNOSIS — I4891 Unspecified atrial fibrillation: Secondary | ICD-10-CM

## 2011-09-24 NOTE — Assessment & Plan Note (Signed)
Nonishemic in setting of atrial fibrillation with RVR. Continue medical therapy and observation.

## 2011-09-24 NOTE — Assessment & Plan Note (Signed)
Associated with mild to moderate regurgitation. Plan followup echocardiogram for next visit.

## 2011-09-24 NOTE — Assessment & Plan Note (Signed)
Persistent. Continue heart rate control and anticoagulation. INR therapeutic today.

## 2011-09-24 NOTE — Progress Notes (Signed)
Clinical Summary [name] is a 78 y.o.female presenting for followup. She was seen back in September. She is doing fairly well without reported chest pain and stable dyspnea. Has only occasional palpitations.  No reported bleeding problems on Coumadin. Other medications are stable.  Blood pressure in [id number] systolic today, and asymptomatic.  Followup ECG reviewed.   Allergies  Allergen Reactions  . Clarithromycin   . Clindamycin   . Doxycycline   . [id number] Acid+Aspartame   . Penicillins     Current Outpatient Prescriptions  Medication Sig Dispense Refill  . albuterol (PROAIR HFA) 108 (90 BASE) MCG/ACT inhaler Inhale 1-2 puffs into the lungs every 6 (six) hours as needed for wheezing. Every 4-6 hours as needed.  1 Inhaler  3  . albuterol (PROVENTIL) (2.5 MG/3ML) 0.083% nebulizer solution Take 3 mLs (2.5 mg total) by nebulization every 4 (four) hours as needed for wheezing.  120 mL  12  . atorvastatin (LIPITOR) 10 MG tablet Take 10 mg by mouth daily.        . Calcium Carbonate-Vitamin D (CALTRATE 600+D) 600-400 MG-UNIT per tablet Take 1 tablet by mouth daily.        . citalopram (CELEXA) 20 MG tablet Take 20 mg by mouth daily.       . diazepam (VALIUM) 2 MG tablet Take 2 mg by mouth 3 (three) times daily as needed.       . digoxin (LANOXIN) 0.125 MG tablet Take 1 tablet (125 mcg total) by mouth daily.  90 tablet  3  . diltiazem (CARDIZEM CD) 240 MG 24 hr capsule Take 1 capsule (240 mg total) by mouth daily.  90 capsule  30  . EPINEPHrine (EPI-PEN) 0.3 MG/0.3ML DEVI Inject 0.3 mg into the muscle as needed.        Marland Kitchen esomeprazole (NEXIUM) 40 MG capsule Take 40 mg by mouth 2 (two) times daily.        . furosemide (LASIX) 40 MG tablet Take 1 tablet (40 mg total) by mouth daily.  30 tablet  6  . mometasone (NASONEX) 50 MCG/ACT nasal spray 2 sprays by Nasal route daily.        Marland Kitchen omalizumab (XOLAIR) 150 MG injection 300 mg once every 28 days  900 mg  3  .  potassium chloride SA (KLOR-CON M20) 20 MEQ tablet Take 20 mEq by mouth daily.        Marland Kitchen warfarin (COUMADIN) 5 MG tablet Take 1 tablet daily or as directed  45 tablet  2    Past Medical History  Diagnosis Date  . Asthma   . Atrial fibrillation   . COPD (chronic obstructive pulmonary disease)   . GERD (gastroesophageal reflux disease)   . Mitral valve prolapse   . Nonischemic cardiomyopathy     LVEF 25-30% improved to 45-50%  . Chronic rhinitis   . Obstructive sleep apnea   . Transudative pleural effusion   . Nasal polyps   . Mitral regurgitation     Mild to moderate 5/12    Past Surgical History  Procedure Date  . Laparoscopic nissen fundoplication [id number]  . Appendectomy 2000  . Nose surgery   . Bilateral cataract surgery     Family History  Problem Relation Age of Onset  . Atrial fibrillation Sister   . Cancer Brother     Lung  . Cancer Brother     Lung  . Cancer Father     Colon  . Cancer Mother  Colon    Social History [name] reports that she quit smoking about 30 years ago. Her smoking use included Cigarettes. She has never used smokeless tobacco. [name] reports that she does not drink alcohol.  Review of Systems Negative except as outlined above.  Physical Examination Filed Vitals:   09/24/11 1256  BP: 99/59  Pulse: 69  Resp: 16   Normally nourished appearing elderly woman in no acute distress.  HEENT: Lids normal, conjunctiva normal, oropharynx clear with moist mucosa.  Neck: Supple, no elevated JVP or carotid bruits, no thyromegaly.  Lungs: Diminished but clear breath sounds, no wheezing or labored breathing.  Cardiac: Irregularly irregular, soft apical systolic murmur, no pericardial rub.  Abdomen: Soft, nontender, bowel sound present.  Skin: Warm and dry.  Extremities: No pitting edema below the knees.  Musculoskeletal: No kyphosis.  Neuropsychiatric: Alert and oriented x3, affect appropriate.   ECG Atrial fibrillation with  rightward axis, NSST changes.    Problem List and Plan

## 2011-09-24 NOTE — Patient Instructions (Signed)
Your physician has requested that you have an echocardiogram. Echocardiography is a painless test that uses sound waves to create images of your heart. It provides your doctor with information about the size and shape of your heart and how well your heart's chambers and valves are working. This procedure takes approximately one hour. There are no restrictions for this procedure.  Your physician recommends that you schedule a follow-up appointment in: 6 months

## 2011-09-25 ENCOUNTER — Telehealth: Payer: Self-pay | Admitting: Internal Medicine

## 2011-09-25 ENCOUNTER — Ambulatory Visit (INDEPENDENT_AMBULATORY_CARE_PROVIDER_SITE_OTHER): Payer: Medicare Other

## 2011-09-25 DIAGNOSIS — J45909 Unspecified asthma, uncomplicated: Secondary | ICD-10-CM

## 2011-09-26 DIAGNOSIS — J45909 Unspecified asthma, uncomplicated: Secondary | ICD-10-CM

## 2011-09-26 MED ORDER — OMALIZUMAB 150 MG ~~LOC~~ SOLR
300.0000 mg | Freq: Once | SUBCUTANEOUS | Status: AC
  Administered 2011-09-26: 300 mg via SUBCUTANEOUS

## 2011-09-26 NOTE — Telephone Encounter (Signed)
-  [facility] at [phone] but was on hold x 10 minutes. WCB

## 2011-09-30 ENCOUNTER — Other Ambulatory Visit: Payer: Self-pay | Admitting: *Deleted

## 2011-09-30 ENCOUNTER — Other Ambulatory Visit: Payer: Self-pay | Admitting: Internal Medicine

## 2011-09-30 MED ORDER — WARFARIN SODIUM 5 MG PO TABS: ORAL_TABLET | ORAL | Status: DC

## 2011-09-30 MED ORDER — ALBUTEROL SULFATE (2.5 MG/3ML) 0.083% IN NEBU: 2.5000 mg | INHALATION_SOLUTION | RESPIRATORY_TRACT | Status: DC | PRN

## 2011-09-30 NOTE — Telephone Encounter (Signed)
I called [facility] to initiate PA for pt's albuterol neb solution since it is not covered under medicare part B per the pharmacy. She states they are going to fax over form to fill out and will needs this faxed back. It will be faxed to triage fax #. Will await fax

## 2011-09-30 NOTE — Telephone Encounter (Signed)
Rx for albuterol (2.5 mg/60ml) 0.83 % #120 faxed to [facility] per pt's request.

## 2011-09-30 NOTE — Telephone Encounter (Signed)
We have received form and placed on [facility] cart for signature. Please advise Dr. Annamaria Boots, thanks

## 2011-09-30 NOTE — Telephone Encounter (Signed)
Called and spoke with patient and explained to patient that Medicare part B now covers nebulizer medications and they are requesting that we have a local dme company supply this for her instead of [facility] or [facility]. Patient states that [facility] has been supplying her medications since her drug plan is through [employer]. I explained to patient that while [facility] and [facility] supply most of her medications, medicare requires that certain medications are supplied and billed from [place]. Also explained to the patient that [place] is requiring that we complete an authorization form as to why patient can not obtain this medication from a dme and we do not have a valid reason as to why.  Patient voiced understanding and asked that we send Rx for albuterol for a 90 day supply to [facility], which is up the road from her. Patient is aware that this medication will be mailed to her and advised patient that if she had any problems obtaining her medication to call me directly at [phone].    Please send written Rx for nebulizer medication to [facility] to fax to [facility] per pt request.

## 2011-09-30 NOTE — Telephone Encounter (Signed)
Script printed for neb albuterol.

## 2011-10-01 ENCOUNTER — Telehealth: Payer: Self-pay | Admitting: Internal Medicine

## 2011-10-01 MED ORDER — LEVOFLOXACIN 750 MG PO TABS: 750.0000 mg | ORAL_TABLET | Freq: Every day | ORAL | Status: AC

## 2011-10-01 NOTE — Telephone Encounter (Signed)
Ok to call in levaquin 750mg  one a day for 7 days, and use neil med sinus rinses am and pm or saline nasal spray Let us know if not improving.

## 2011-10-01 NOTE — Telephone Encounter (Signed)
Spoke with pt's daughter. She states she is sure that the pt has sinus infection- c/o facial pressure, HA, nasal congestion, yellow nasal d/c, and prod cough with white sputum. She states onset x 4 days ago. Would like abx called in. I advised that CDY out of the office at this time and asked if she preferred to wait for his recs when he returns to clinic, or have the msg sent to doc on call. She states needs this taken care of asap so will forward to [facility]. Please advise, thanks! Looks like CDY has txed her with avelox in the past- has a lot of drug allergies Allergies  Allergen Reactions  . Clarithromycin   . Clindamycin   . Doxycycline   . [id number] Acid+Aspartame   . Penicillins

## 2011-10-01 NOTE — Telephone Encounter (Signed)
I spoke with pt and advised her of kc recs. She voiced her understanding and rx was called into the pharmacy. Pt aware of the rx directions.

## 2011-10-08 ENCOUNTER — Other Ambulatory Visit: Payer: Self-pay | Admitting: *Deleted

## 2011-10-08 MED ORDER — POTASSIUM CHLORIDE CRYS ER 20 MEQ PO TBCR: 20.0000 meq | EXTENDED_RELEASE_TABLET | Freq: Every day | ORAL | Status: DC

## 2011-10-09 ENCOUNTER — Other Ambulatory Visit: Payer: Self-pay | Admitting: *Deleted

## 2011-10-09 MED ORDER — POTASSIUM CHLORIDE CRYS ER 20 MEQ PO TBCR: 20.0000 meq | EXTENDED_RELEASE_TABLET | Freq: Every day | ORAL | Status: DC

## 2011-10-20 ENCOUNTER — Other Ambulatory Visit (HOSPITAL_COMMUNITY): Payer: Self-pay | Admitting: Family Medicine

## 2011-10-20 DIAGNOSIS — R634 Abnormal weight loss: Secondary | ICD-10-CM

## 2011-10-22 ENCOUNTER — Ambulatory Visit (INDEPENDENT_AMBULATORY_CARE_PROVIDER_SITE_OTHER): Payer: Medicare Other | Admitting: *Deleted

## 2011-10-22 ENCOUNTER — Ambulatory Visit (HOSPITAL_COMMUNITY)
Admission: RE | Admit: 2011-10-22 | Discharge: 2011-10-22 | Disposition: A | Discharge status: Home / Self Care | Payer: Medicare Other | Source: Ambulatory Visit | Attending: Family Medicine | Admitting: Family Medicine

## 2011-10-22 DIAGNOSIS — K449 Diaphragmatic hernia without obstruction or gangrene: Secondary | ICD-10-CM | POA: Insufficient documentation

## 2011-10-22 DIAGNOSIS — R630 Anorexia: Secondary | ICD-10-CM | POA: Insufficient documentation

## 2011-10-22 DIAGNOSIS — R911 Solitary pulmonary nodule: Secondary | ICD-10-CM | POA: Insufficient documentation

## 2011-10-22 DIAGNOSIS — I4891 Unspecified atrial fibrillation: Secondary | ICD-10-CM

## 2011-10-22 DIAGNOSIS — E041 Nontoxic single thyroid nodule: Secondary | ICD-10-CM | POA: Insufficient documentation

## 2011-10-22 DIAGNOSIS — R634 Abnormal weight loss: Secondary | ICD-10-CM | POA: Insufficient documentation

## 2011-10-22 DIAGNOSIS — Z7901 Long term (current) use of anticoagulants: Secondary | ICD-10-CM

## 2011-10-22 LAB — POCT INR: INR: 2.4

## 2011-10-22 MED ORDER — IOHEXOL 300 MG/ML  SOLN
100.0000 mL | Freq: Once | INTRAMUSCULAR | Status: AC | PRN
  Administered 2011-10-22: 100 mL via INTRAVENOUS

## 2011-10-27 ENCOUNTER — Ambulatory Visit (INDEPENDENT_AMBULATORY_CARE_PROVIDER_SITE_OTHER): Payer: Medicare Other

## 2011-10-27 DIAGNOSIS — J45909 Unspecified asthma, uncomplicated: Secondary | ICD-10-CM

## 2011-10-27 MED ORDER — OMALIZUMAB 150 MG ~~LOC~~ SOLR
300.0000 mg | Freq: Once | SUBCUTANEOUS | Status: AC
  Administered 2011-10-27: 300 mg via SUBCUTANEOUS

## 2011-10-28 ENCOUNTER — Other Ambulatory Visit (HOSPITAL_COMMUNITY): Payer: Self-pay | Admitting: Family Medicine

## 2011-10-28 DIAGNOSIS — D34 Benign neoplasm of thyroid gland: Secondary | ICD-10-CM

## 2011-10-30 ENCOUNTER — Ambulatory Visit (HOSPITAL_COMMUNITY)
Admission: RE | Admit: 2011-10-30 | Discharge: 2011-10-30 | Disposition: A | Discharge status: Home / Self Care | Payer: Medicare Other | Source: Ambulatory Visit | Attending: Family Medicine | Admitting: Family Medicine

## 2011-10-30 DIAGNOSIS — E049 Nontoxic goiter, unspecified: Secondary | ICD-10-CM | POA: Insufficient documentation

## 2011-10-30 DIAGNOSIS — D34 Benign neoplasm of thyroid gland: Secondary | ICD-10-CM

## 2011-11-13 ENCOUNTER — Other Ambulatory Visit (HOSPITAL_COMMUNITY): Payer: Self-pay | Admitting: "Endocrinology

## 2011-11-13 DIAGNOSIS — E049 Nontoxic goiter, unspecified: Secondary | ICD-10-CM

## 2011-11-18 ENCOUNTER — Other Ambulatory Visit (HOSPITAL_COMMUNITY): Payer: Self-pay | Admitting: "Endocrinology

## 2011-11-18 ENCOUNTER — Ambulatory Visit (HOSPITAL_COMMUNITY)
Admission: RE | Admit: 2011-11-18 | Discharge: 2011-11-18 | Disposition: A | Discharge status: Home / Self Care | Payer: Medicare Other | Source: Ambulatory Visit | Attending: "Endocrinology | Admitting: "Endocrinology

## 2011-11-18 DIAGNOSIS — E049 Nontoxic goiter, unspecified: Secondary | ICD-10-CM

## 2011-11-18 DIAGNOSIS — E0789 Other specified disorders of thyroid: Secondary | ICD-10-CM | POA: Insufficient documentation

## 2011-11-18 NOTE — Procedures (Signed)
Ultrasound guided aspiration needle biopsy 2.5 cm right lobe thyroid nodule using local anesthesia and 25g needles.  No immediate complications.

## 2011-11-18 NOTE — Progress Notes (Signed)
Lidocaine 2%        29mL injected

## 2011-11-27 ENCOUNTER — Encounter: Payer: Self-pay | Admitting: Internal Medicine

## 2011-11-27 ENCOUNTER — Ambulatory Visit (INDEPENDENT_AMBULATORY_CARE_PROVIDER_SITE_OTHER): Payer: Medicare Other | Admitting: Internal Medicine

## 2011-11-27 ENCOUNTER — Ambulatory Visit (INDEPENDENT_AMBULATORY_CARE_PROVIDER_SITE_OTHER): Payer: Medicare Other

## 2011-11-27 VITALS — BP 118/72 | HR 75 | Ht 67.0 in | Wt 149.2 lb

## 2011-11-27 DIAGNOSIS — J45909 Unspecified asthma, uncomplicated: Secondary | ICD-10-CM

## 2011-11-27 DIAGNOSIS — J449 Chronic obstructive pulmonary disease, unspecified: Secondary | ICD-10-CM

## 2011-11-27 DIAGNOSIS — R918 Other nonspecific abnormal finding of lung field: Secondary | ICD-10-CM

## 2011-11-27 DIAGNOSIS — J301 Allergic rhinitis due to pollen: Secondary | ICD-10-CM

## 2011-11-27 MED ORDER — OMALIZUMAB 150 MG ~~LOC~~ SOLR
300.0000 mg | Freq: Once | SUBCUTANEOUS | Status: AC
  Administered 2011-11-27: 300 mg via SUBCUTANEOUS

## 2011-11-27 NOTE — Progress Notes (Signed)
Patient ID: [name], female    DOB: 07/10/1934, 78 y.o.   MRN: [id number]  HPI 7/113/12-  78 yoF former smoker, followed for Chronic asthma/ COPD, chronic rhinosinusitis, complicated by hx AFib/ anticoagulation, CHF, cardiomyopathy, mitral insufficiency, GERD Last here December 11/19/10- note reviewed She has felt well controlled with no acute breathing issues. She saw Dr Domenic Polite for cardiac f/u 2 weeks ago and Echo was good with no med changes. Uses rescue inhaler only occasionally, usually on first rising. Little wheeze at times, much better. Little DOE with ADLs PFT 12/06/10= chronic obstructive asthma   7/113/12-  78 yoF former smoker, followed for Chronic asthma/ COPD, chronic rhinosinusitis, complicated by hx AFib/ anticoagulation, CHF, cardiomyopathy, mitral insufficiency, GERD Has had flu vaccine. Persistent head and chest congestion for the past 2 weeks. On November 7 we sent  Avelox for 5 days. This helped but didn't last long enough. She took a leftover prednisone taper but wants to avoid more prednisone now if possible. She has noted mild tremor in her hands for the past month no lateralizing finding. She continues Xolair injections. Using nebulizer or rescue inhaler only every 1 or 2 days. Has lost some weight and admits she has been dieting some but otherwise she's not sure that explains it. Thyroid function test normal in the past.  11/27/11- 78 yoF former smoker, followed for Chronic asthma/ COPD, chronic rhinosinusitis, complicated by hx AFib/ anticoagulation, CHF, cardiomyopathy, mitral insufficiency, GERD, Lung nodules Continues to do well. Continues Xolair. She thyroid nodule biopsy by interventional radiology. Waiting for results. Eyes have been watering some increased nasal drainage. We discussed possibility of seasonal allergic symptoms when she is getting Xolair. CT chest 10/20/2011-bilateral nodules less than 10 mm. Needs followup CT scan in May.  Review of  Systems-see HPI Constitutional:    weight loss, night sweats, fevers, chills, fatigue, lassitude. HEENT:   No-  headaches, difficulty swallowing, tooth/dental problems, sore throat,       No-  sneezing, itching, ear ache, +nasal congestion, post nasal drip,  CV:  No-   chest pain, orthopnea, PND, swelling in lower extremities, anasarca, dizziness, palpitations Resp: No- acute  shortness of breath with exertion or at rest.              No-   productive cough,  + non-productive cough,  No- coughing up of blood.              No-   change in color of mucus.  No- wheezing.   Skin: No-   rash or lesions. GI:  No-   heartburn, indigestion, abdominal pain, nausea, vomiting,  GU: No-   dysuria, . MS:  No-   joint pain or swelling.  Neuro-     nothing unusual Psych:  No- change in mood or affect. No depression or anxiety.  No memory loss.  Objective:   Physical Exam General- Alert, Oriented, Affect-appropriate/ cheerful, Distress- none acute, thin Skin- rash-none, lesions- none, excoriation- none Lymphadenopathy- none Head- atraumatic            Eyes- Gross vision intact, PERRLA, conjunctivae clear secretions            Ears- Hearing, canals-normal            Nose- turbinate edema, no-Septal dev, mucus, polyps, erosion, perforation             Throat- Mallampati II , mucosa clear/ not red , drainage- none, tonsils- atrophic, dentures Neck- flexible , trachea  midline, no stridor , thyroid nl, carotid no bruit Chest - symmetrical excursion , unlabored           Heart/CV- RR w/ occ skip , no murmur , no gallop  , no rub, nl s1 s2                           - JVD- none , edema- none, stasis changes- none, varices- none           Lung-raspy laugh, light crackles right base, wheeze- none, cough- none , dullness-none, rub- none           Chest wall-  Abd-  Br/ Gen/ Rectal- Not done, not indicated Extrem- cyanosis- none, clubbing, none, atrophy- none, strength- nl Neuro- slight resting tremor

## 2011-11-27 NOTE — Patient Instructions (Addendum)
Consider trying an otc antihistamine like Claritin/ loratadine  If needed for runny nose "allergy"  Continue present meds  When you come back in May, we will then arrange the follow-up CT scan of your chest to check on the lung nodules.

## 2011-11-30 DIAGNOSIS — R918 Other nonspecific abnormal finding of lung field: Secondary | ICD-10-CM | POA: Insufficient documentation

## 2011-11-30 DIAGNOSIS — J301 Allergic rhinitis due to pollen: Secondary | ICD-10-CM | POA: Insufficient documentation

## 2011-11-30 NOTE — Assessment & Plan Note (Signed)
Minor rhinitis and conjunctivitis should respond t.o occasional antihistamine

## 2011-11-30 NOTE — Assessment & Plan Note (Signed)
Well controlled. We'll continue Xolair.

## 2011-11-30 NOTE — Assessment & Plan Note (Signed)
Low concern but will followup.

## 2011-12-04 ENCOUNTER — Ambulatory Visit (INDEPENDENT_AMBULATORY_CARE_PROVIDER_SITE_OTHER): Payer: Medicare Other | Admitting: *Deleted

## 2011-12-04 DIAGNOSIS — I4891 Unspecified atrial fibrillation: Secondary | ICD-10-CM

## 2011-12-04 DIAGNOSIS — Z7901 Long term (current) use of anticoagulants: Secondary | ICD-10-CM

## 2011-12-25 ENCOUNTER — Ambulatory Visit (INDEPENDENT_AMBULATORY_CARE_PROVIDER_SITE_OTHER): Payer: Medicare Other

## 2011-12-25 DIAGNOSIS — J45909 Unspecified asthma, uncomplicated: Secondary | ICD-10-CM

## 2011-12-25 MED ORDER — OMALIZUMAB 150 MG ~~LOC~~ SOLR
300.0000 mg | Freq: Once | SUBCUTANEOUS | Status: AC
  Administered 2011-12-25: 300 mg via SUBCUTANEOUS

## 2012-01-15 ENCOUNTER — Ambulatory Visit (INDEPENDENT_AMBULATORY_CARE_PROVIDER_SITE_OTHER): Payer: Medicare Other | Admitting: *Deleted

## 2012-01-15 DIAGNOSIS — I4891 Unspecified atrial fibrillation: Secondary | ICD-10-CM

## 2012-01-15 DIAGNOSIS — Z7901 Long term (current) use of anticoagulants: Secondary | ICD-10-CM

## 2012-01-26 ENCOUNTER — Other Ambulatory Visit (HOSPITAL_COMMUNITY): Payer: Self-pay | Admitting: Family Medicine

## 2012-01-26 DIAGNOSIS — I10 Essential (primary) hypertension: Secondary | ICD-10-CM

## 2012-01-26 DIAGNOSIS — E119 Type 2 diabetes mellitus without complications: Secondary | ICD-10-CM

## 2012-01-26 DIAGNOSIS — E785 Hyperlipidemia, unspecified: Secondary | ICD-10-CM

## 2012-01-27 ENCOUNTER — Ambulatory Visit: Payer: Medicare Other

## 2012-01-29 ENCOUNTER — Ambulatory Visit (HOSPITAL_COMMUNITY)
Admission: RE | Admit: 2012-01-29 | Discharge: 2012-01-29 | Disposition: A | Discharge status: Home / Self Care | Payer: Medicare Other | Source: Ambulatory Visit | Attending: Family Medicine | Admitting: Family Medicine

## 2012-01-29 DIAGNOSIS — R0602 Shortness of breath: Secondary | ICD-10-CM | POA: Insufficient documentation

## 2012-01-29 DIAGNOSIS — E119 Type 2 diabetes mellitus without complications: Secondary | ICD-10-CM

## 2012-01-29 DIAGNOSIS — E049 Nontoxic goiter, unspecified: Secondary | ICD-10-CM | POA: Insufficient documentation

## 2012-01-29 DIAGNOSIS — I10 Essential (primary) hypertension: Secondary | ICD-10-CM | POA: Insufficient documentation

## 2012-01-29 DIAGNOSIS — J984 Other disorders of lung: Secondary | ICD-10-CM | POA: Insufficient documentation

## 2012-01-29 DIAGNOSIS — E785 Hyperlipidemia, unspecified: Secondary | ICD-10-CM | POA: Insufficient documentation

## 2012-01-30 ENCOUNTER — Ambulatory Visit (INDEPENDENT_AMBULATORY_CARE_PROVIDER_SITE_OTHER): Payer: Medicare Other

## 2012-01-30 DIAGNOSIS — J45909 Unspecified asthma, uncomplicated: Secondary | ICD-10-CM

## 2012-01-30 MED ORDER — OMALIZUMAB 150 MG ~~LOC~~ SOLR
300.0000 mg | Freq: Once | SUBCUTANEOUS | Status: AC
  Administered 2012-01-30: 300 mg via SUBCUTANEOUS

## 2012-02-05 ENCOUNTER — Encounter: Payer: Self-pay | Admitting: Internal Medicine

## 2012-02-05 ENCOUNTER — Ambulatory Visit (INDEPENDENT_AMBULATORY_CARE_PROVIDER_SITE_OTHER): Payer: Medicare Other | Admitting: Internal Medicine

## 2012-02-05 ENCOUNTER — Other Ambulatory Visit: Payer: Self-pay | Admitting: *Deleted

## 2012-02-05 VITALS — BP 98/68 | HR 60 | Ht 67.0 in | Wt 146.4 lb

## 2012-02-05 DIAGNOSIS — G4730 Sleep apnea, unspecified: Secondary | ICD-10-CM

## 2012-02-05 DIAGNOSIS — R918 Other nonspecific abnormal finding of lung field: Secondary | ICD-10-CM

## 2012-02-05 DIAGNOSIS — J449 Chronic obstructive pulmonary disease, unspecified: Secondary | ICD-10-CM

## 2012-02-05 DIAGNOSIS — J301 Allergic rhinitis due to pollen: Secondary | ICD-10-CM

## 2012-02-05 MED ORDER — WARFARIN SODIUM 5 MG PO TABS: ORAL_TABLET | ORAL | Status: DC

## 2012-02-05 NOTE — Patient Instructions (Signed)
Order- non contrast CT chest to be done in 6 months at [facility]    Dx lung nodules

## 2012-02-05 NOTE — Progress Notes (Signed)
Patient ID: [name], female    DOB: [birth year], 78 y.o.   MRN: [id number]  HPI 7/113/12-  76 yoF former smoker, followed for Chronic asthma/ COPD, chronic rhinosinusitis, complicated by hx AFib/ anticoagulation, CHF, cardiomyopathy, mitral insufficiency, GERD Last here December 11/19/10- note reviewed She has felt well controlled with no acute breathing issues. She saw Dr Domenic Polite for cardiac f/u 2 weeks ago and Echo was good with no med changes. Uses rescue inhaler only occasionally, usually on first rising. Little wheeze at times, much better. Little DOE with ADLs PFT 12/06/10= chronic obstructive asthma   7/113/12-  76 yoF former smoker, followed for Chronic asthma/ COPD, chronic rhinosinusitis, complicated by hx AFib/ anticoagulation, CHF, cardiomyopathy, mitral insufficiency, GERD Has had flu vaccine. Persistent head and chest congestion for the past 2 weeks. On November 7 we sent  Avelox for 5 days. This helped but didn't last long enough. She took a leftover prednisone taper but wants to avoid more prednisone now if possible. She has noted mild tremor in her hands for the past month no lateralizing finding. She continues Xolair injections. Using nebulizer or rescue inhaler only every 1 or 2 days. Has lost some weight and admits she has been dieting some but otherwise she's not sure that explains it. Thyroid function test normal in the past.  11/27/11- 76 yoF former smoker, followed for Chronic asthma/ COPD, chronic rhinosinusitis, complicated by hx AFib/ anticoagulation, CHF, cardiomyopathy, mitral insufficiency, GERD, Lung nodules Continues to do well. Continues Xolair. She thyroid nodule biopsy by interventional radiology. Waiting for results. Eyes have been watering some increased nasal drainage. We discussed possibility of seasonal allergic symptoms when she is getting Xolair. CT chest 10/20/2011-bilateral nodules less than 10 mm. Needs followup CT scan in May.  02/05/12- 76 yoF  former smoker, followed for Chronic asthma/ COPD, chronic rhinosinusitis, complicated by hx AFib/ anticoagulation, CHF, cardiomyopathy, mitral insufficiency, GERD, Lung nodules Denies any cough, congestion, SOB, or wheezing, Slight flare up with allergies at this time and still on Xolair. CAT score 8 She is pleased to be holding her current weight. Sometimes has a little wheeze but not much rarely needs a rescue inhaler and uses her nebulizer 2 or 3 times per week. Continues Xolair and believes it helps. Denies reflux or heartburn. CT chest  01/29/12- images reviewed with her. IMPRESSION:  1. Scattered bilateral pulmonary nodules are unchanged in size  from 10/22/2011. Additional follow-up CT chest without contrast  could be performed in 6 months to ensure continued stability, as  clinically indicated. This recommendation follows the consensus  statement: Guidelines for Management of Small Pulmonary Nodules  Detected on CT Scans: A Statement from the [place] as  published in Radiology 2005; 237:395-400. If multidisciplinary  follow up management is desired, this is available in the [place] through the [facility] [phone]-  [phone].  2. Dominant right thyroid nodule. Consider further evaluation with  thyroid ultrasound. If patient is clinically hyperthyroid,  consider nuclear medicine thyroid uptake and scan.  Original Report Authenticated By: Luretha Rued, M.D.   Review of Systems-see HPI Constitutional:    weight loss, night sweats, fevers, chills, fatigue, lassitude. HEENT:   No-  headaches, difficulty swallowing, tooth/dental problems, sore throat,       No-  sneezing, itching, ear ache, +nasal congestion, post nasal drip,  CV:  No-   chest pain, orthopnea, PND, swelling in lower extremities, anasarca, dizziness, palpitations Resp: No- acute  shortness of breath  with exertion or at rest.              No-   productive cough,  + non-productive  cough,  No- coughing up of blood.              No-   change in color of mucus.  Little wheezing.   Skin: No-   rash or lesions. GI:  No-   heartburn, indigestion, abdominal pain, nausea, vomiting,  GU: . MS:  No-   joint pain or swelling.  Neuro-     nothing unusual Psych:  No- change in mood or affect. No depression or anxiety.  No memory loss.  Objective:   Physical Exam General- Alert, Oriented, Affect-appropriate/ cheerful, Distress- none acute, thin Skin- rash-none, lesions- none, excoriation- none Lymphadenopathy- none Head- atraumatic            Eyes- Gross vision intact, PERRLA, conjunctivae clear secretions            Ears- Hearing, canals-normal            Nose- turbinate edema, no-Septal dev, mucus, polyps, erosion, perforation             Throat- Mallampati II , mucosa clear/ not red , drainage- none, tonsils- atrophic, dentures Neck- flexible , trachea midline, no stridor , thyroid nl, carotid no bruit Chest - symmetrical excursion , unlabored           Heart/CV- IRR well controlled , no murmur , no gallop  , no rub, nl s1 s2                           - JVD- none , edema- none, stasis changes- none, varices- none           Lung- clear, wheeze- none, cough- none , dullness-none, rub- none           Chest wall-  Abd-  Br/ Gen/ Rectal- Not done, not indicated Extrem- cyanosis- none, clubbing, none, atrophy- none, strength- nl Neuro- slight resting tremor

## 2012-02-10 NOTE — Assessment & Plan Note (Signed)
Plan-continue Xolair. Use rescue inhaler or nebulizer when needed. Education done. Recognize symptom overlap with her cardiomyopathy relation

## 2012-02-10 NOTE — Assessment & Plan Note (Signed)
Not reassessed since she has lost weight.

## 2012-02-10 NOTE — Assessment & Plan Note (Signed)
Plan followup CT in 6 months-November 2013

## 2012-02-10 NOTE — Assessment & Plan Note (Addendum)
Well controlled with no significant flare during the spring season. Taking Xolair.

## 2012-02-12 ENCOUNTER — Ambulatory Visit (INDEPENDENT_AMBULATORY_CARE_PROVIDER_SITE_OTHER): Payer: Medicare Other | Admitting: *Deleted

## 2012-02-12 DIAGNOSIS — I4891 Unspecified atrial fibrillation: Secondary | ICD-10-CM

## 2012-02-12 DIAGNOSIS — Z7901 Long term (current) use of anticoagulants: Secondary | ICD-10-CM

## 2012-02-25 ENCOUNTER — Other Ambulatory Visit: Payer: Self-pay | Admitting: *Deleted

## 2012-02-25 MED ORDER — FUROSEMIDE 40 MG PO TABS: 40.0000 mg | ORAL_TABLET | Freq: Every day | ORAL | Status: DC

## 2012-02-27 ENCOUNTER — Ambulatory Visit (INDEPENDENT_AMBULATORY_CARE_PROVIDER_SITE_OTHER): Payer: Medicare Other

## 2012-02-27 DIAGNOSIS — J45909 Unspecified asthma, uncomplicated: Secondary | ICD-10-CM

## 2012-02-27 MED ORDER — OMALIZUMAB 150 MG ~~LOC~~ SOLR
300.0000 mg | Freq: Once | SUBCUTANEOUS | Status: AC
  Administered 2012-02-27: 300 mg via SUBCUTANEOUS

## 2012-03-01 ENCOUNTER — Ambulatory Visit: Payer: Medicare Other

## 2012-03-04 ENCOUNTER — Ambulatory Visit (INDEPENDENT_AMBULATORY_CARE_PROVIDER_SITE_OTHER): Payer: Medicare Other | Admitting: *Deleted

## 2012-03-04 DIAGNOSIS — Z7901 Long term (current) use of anticoagulants: Secondary | ICD-10-CM

## 2012-03-04 DIAGNOSIS — I4891 Unspecified atrial fibrillation: Secondary | ICD-10-CM

## 2012-03-24 ENCOUNTER — Ambulatory Visit (INDEPENDENT_AMBULATORY_CARE_PROVIDER_SITE_OTHER): Payer: Medicare Other | Admitting: *Deleted

## 2012-03-24 DIAGNOSIS — I4891 Unspecified atrial fibrillation: Secondary | ICD-10-CM

## 2012-03-24 DIAGNOSIS — Z7901 Long term (current) use of anticoagulants: Secondary | ICD-10-CM

## 2012-03-24 LAB — POCT INR: INR: 2.2

## 2012-03-25 ENCOUNTER — Ambulatory Visit: Payer: Medicare Other | Admitting: Cardiology

## 2012-03-29 ENCOUNTER — Ambulatory Visit (INDEPENDENT_AMBULATORY_CARE_PROVIDER_SITE_OTHER): Payer: Medicare Other

## 2012-03-29 DIAGNOSIS — J45909 Unspecified asthma, uncomplicated: Secondary | ICD-10-CM

## 2012-03-30 MED ORDER — OMALIZUMAB 150 MG ~~LOC~~ SOLR
300.0000 mg | Freq: Once | SUBCUTANEOUS | Status: AC
  Administered 2012-03-30: 300 mg via SUBCUTANEOUS

## 2012-04-12 ENCOUNTER — Ambulatory Visit (INDEPENDENT_AMBULATORY_CARE_PROVIDER_SITE_OTHER): Payer: Medicare Other | Admitting: Cardiology

## 2012-04-12 ENCOUNTER — Encounter: Payer: Self-pay | Admitting: Cardiology

## 2012-04-12 VITALS — BP 100/70 | HR 70 | Wt 146.0 lb

## 2012-04-12 DIAGNOSIS — I080 Rheumatic disorders of both mitral and aortic valves: Secondary | ICD-10-CM

## 2012-04-12 DIAGNOSIS — I059 Rheumatic mitral valve disease, unspecified: Secondary | ICD-10-CM

## 2012-04-12 DIAGNOSIS — I4891 Unspecified atrial fibrillation: Secondary | ICD-10-CM

## 2012-04-12 DIAGNOSIS — I341 Nonrheumatic mitral (valve) prolapse: Secondary | ICD-10-CM

## 2012-04-12 DIAGNOSIS — I429 Cardiomyopathy, unspecified: Secondary | ICD-10-CM

## 2012-04-12 NOTE — Assessment & Plan Note (Signed)
Nonischemic, LVEF had improved to the range of 45-50% as of last year. Continue medical therapy, mainly aimed at rate control of atrial fibrillation which was likely the culprit for her initially severe cardiomyopathy.

## 2012-04-12 NOTE — Assessment & Plan Note (Signed)
In association with mitral valve prolapse. She had mild to moderate regurgitation as of May of last year. Followup echocardiogram planned, also to reassess LV function.

## 2012-04-12 NOTE — Progress Notes (Signed)
Clinical Summary [name] is a 78 y.o.female presenting for followup. I saw her back in January. She reports no major change in her functional capacity, no chest pain or palpitations, stable breathing effort. I reviewed Dr. Janee Cunningham interval notes.  Since last visit she was found to have pulmonary nodules - states that Dr. Annamaria Cunningham plans followup chest CT 6 months after original study in May. She had a thyroid nodule that was reportedly benign at biopsy.  Labwork from May showed potassium 4.4, BUN 26, creatinie 1.2, Hgb 12.8.  She has not had a followup echocardiogram as yet. Previous study from last year reviewed.  Allergies  Allergen Reactions  . Amoxicillin-Pot Clavulanate   . Clarithromycin   . Clindamycin   . Doxycycline   . Penicillins     Current Outpatient Prescriptions  Medication Sig Dispense Refill  . albuterol (PROAIR HFA) 108 (90 BASE) MCG/ACT inhaler Inhale 1-2 puffs into the lungs every 6 (six) hours as needed for wheezing. Every 4-6 hours as needed.  1 Inhaler  3  . albuterol (PROVENTIL) (2.5 MG/3ML) 0.083% nebulizer solution Take 3 mLs (2.5 mg total) by nebulization every 4 (four) hours as needed for wheezing or shortness of breath.  120 mL  12  . atorvastatin (LIPITOR) 10 MG tablet Take 10 mg by mouth daily.        . Calcium Carbonate-Vitamin D (CALTRATE 600+D) 600-400 MG-UNIT per tablet Take 1 tablet by mouth daily.        . citalopram (CELEXA) 20 MG tablet Take 20 mg by mouth daily.       . diazepam (VALIUM) 2 MG tablet Take 2 mg by mouth 3 (three) times daily as needed.       . digoxin (LANOXIN) 0.125 MG tablet Take 1 tablet (125 mcg total) by mouth daily.  90 tablet  3  . diltiazem (CARDIZEM CD) 240 MG 24 hr capsule Take 1 capsule (240 mg total) by mouth daily.  90 capsule  30  . EPINEPHrine (EPI-PEN) 0.3 MG/0.3ML DEVI Inject 0.3 mg into the muscle as needed.        Marland Kitchen esomeprazole (NEXIUM) 40 MG capsule Take 40 mg by mouth 2 (two) times daily.        . furosemide  (LASIX) 40 MG tablet Take 1 tablet (40 mg total) by mouth daily.  30 tablet  10  . mometasone (NASONEX) 50 MCG/ACT nasal spray 2 sprays by Nasal route daily.        Marland Kitchen omalizumab (XOLAIR) 150 MG injection 300 mg once every 28 days  900 mg  3  . potassium chloride SA (KLOR-CON M20) 20 MEQ tablet Take 1 tablet (20 mEq total) by mouth daily.  30 tablet  6  . warfarin (COUMADIN) 5 MG tablet Take 1 tablet daily or as directed  45 tablet  2    Past Medical History  Diagnosis Date  . Asthma   . Atrial fibrillation   . COPD (chronic obstructive pulmonary disease)   . GERD (gastroesophageal reflux disease)   . Mitral valve prolapse   . Nonischemic cardiomyopathy     LVEF 25-30% improved to 45-50%  . Chronic rhinitis   . Obstructive sleep apnea   . Transudative pleural effusion   . Nasal polyps   . Mitral regurgitation     Mild to moderate 5/12    Social History [name] reports that she quit smoking about 31 years ago. Her smoking use included Cigarettes. She has never used smokeless tobacco.  [name] reports that she does not drink alcohol.  Review of Systems No reported bleeding problems on Coumadin. No falls. Stable appetite. Otherwise negative except as outlined.  Physical Examination Filed Vitals:   04/12/12 1355  BP: 100/70  Pulse: 70   Thin elderly woman in no acute distress.  HEENT: Lids normal, conjunctiva normal, oropharynx clear with moist mucosa.  Neck: Supple, no elevated JVP or carotid bruits, no thyromegaly.  Lungs: Diminished but clear breath sounds, no wheezing or labored breathing.  Cardiac: Irregularly irregular, very soft apical systolic murmur, no pericardial rub.  Abdomen: Soft, nontender, bowel sound present.  Skin: Warm and dry.  Extremities: No pitting edema below the knees.  Musculoskeletal: No kyphosis.  Neuropsychiatric: Alert and oriented x3, affect appropriate.   Problem List and Plan   MITRAL REGURGITATION In association with mitral valve  prolapse. She had mild to moderate regurgitation as of May of last year. Followup echocardiogram planned, also to reassess LV function.  UNSPECIFIED SECONDARY CARDIOMYOPATHY Nonischemic, LVEF had improved to the range of 45-50% as of last year. Continue medical therapy, mainly aimed at rate control of atrial fibrillation which was likely the culprit for her initially severe cardiomyopathy.  Atrial fibrillation Persistent, continue plan of anticoagulation and rate control.    [name], M.D., F.A.C.C.

## 2012-04-12 NOTE — Patient Instructions (Addendum)
Your physician recommends that you schedule a follow-up appointment in: 6 months  Your physician has requested that you have an echocardiogram. Echocardiography is a painless test that uses sound waves to create images of your heart. It provides your doctor with information about the size and shape of your heart and how well your heart's chambers and valves are working. This procedure takes approximately one hour. There are no restrictions for this procedure.

## 2012-04-12 NOTE — Assessment & Plan Note (Signed)
Persistent, continue plan of anticoagulation and rate control.

## 2012-04-22 IMAGING — CR DG CHEST 2V
2 series · 2 of 2 positions shown · non-contrast
Comparison: 08/21/2010

CLINICAL DATA: Atrial fibrillation with RVR.

CHEST - 2 VIEW

[w chest pa]
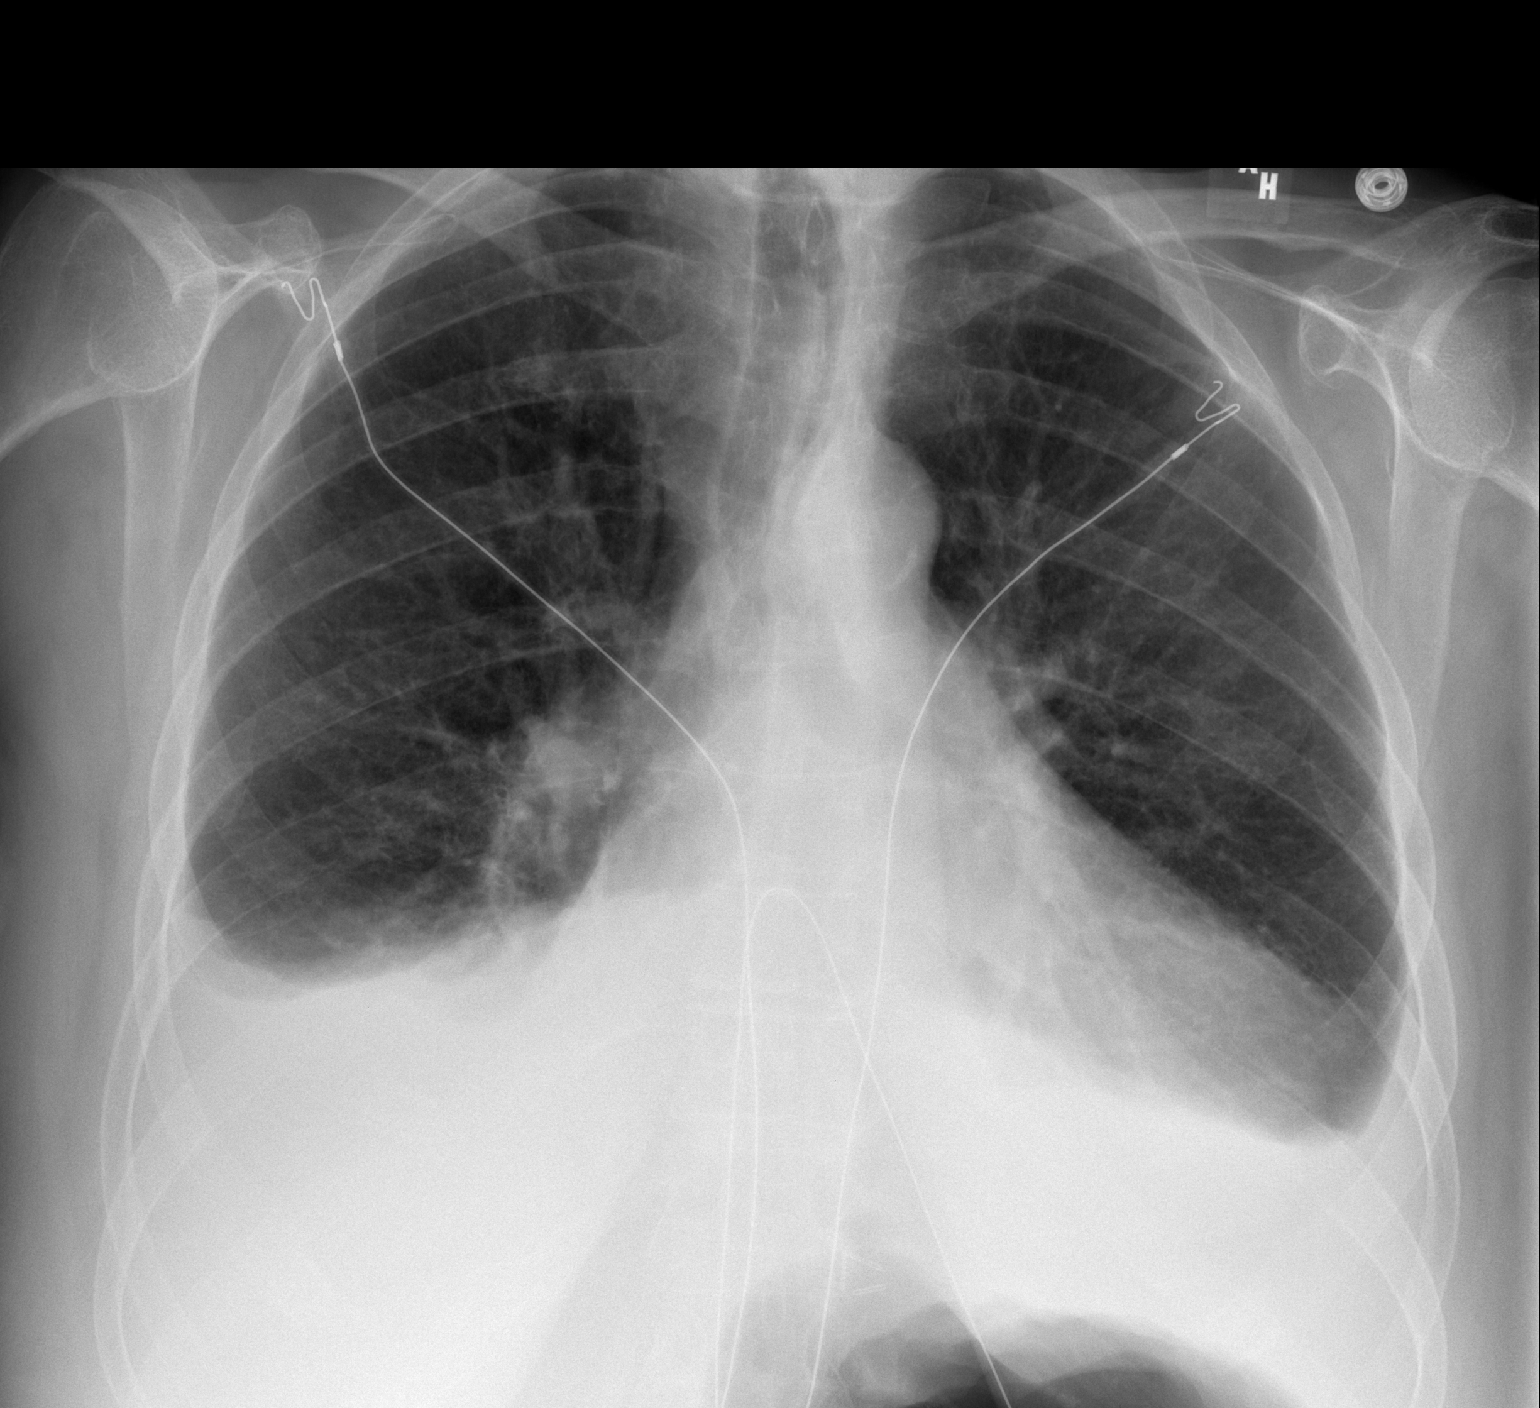

[w chest lat]
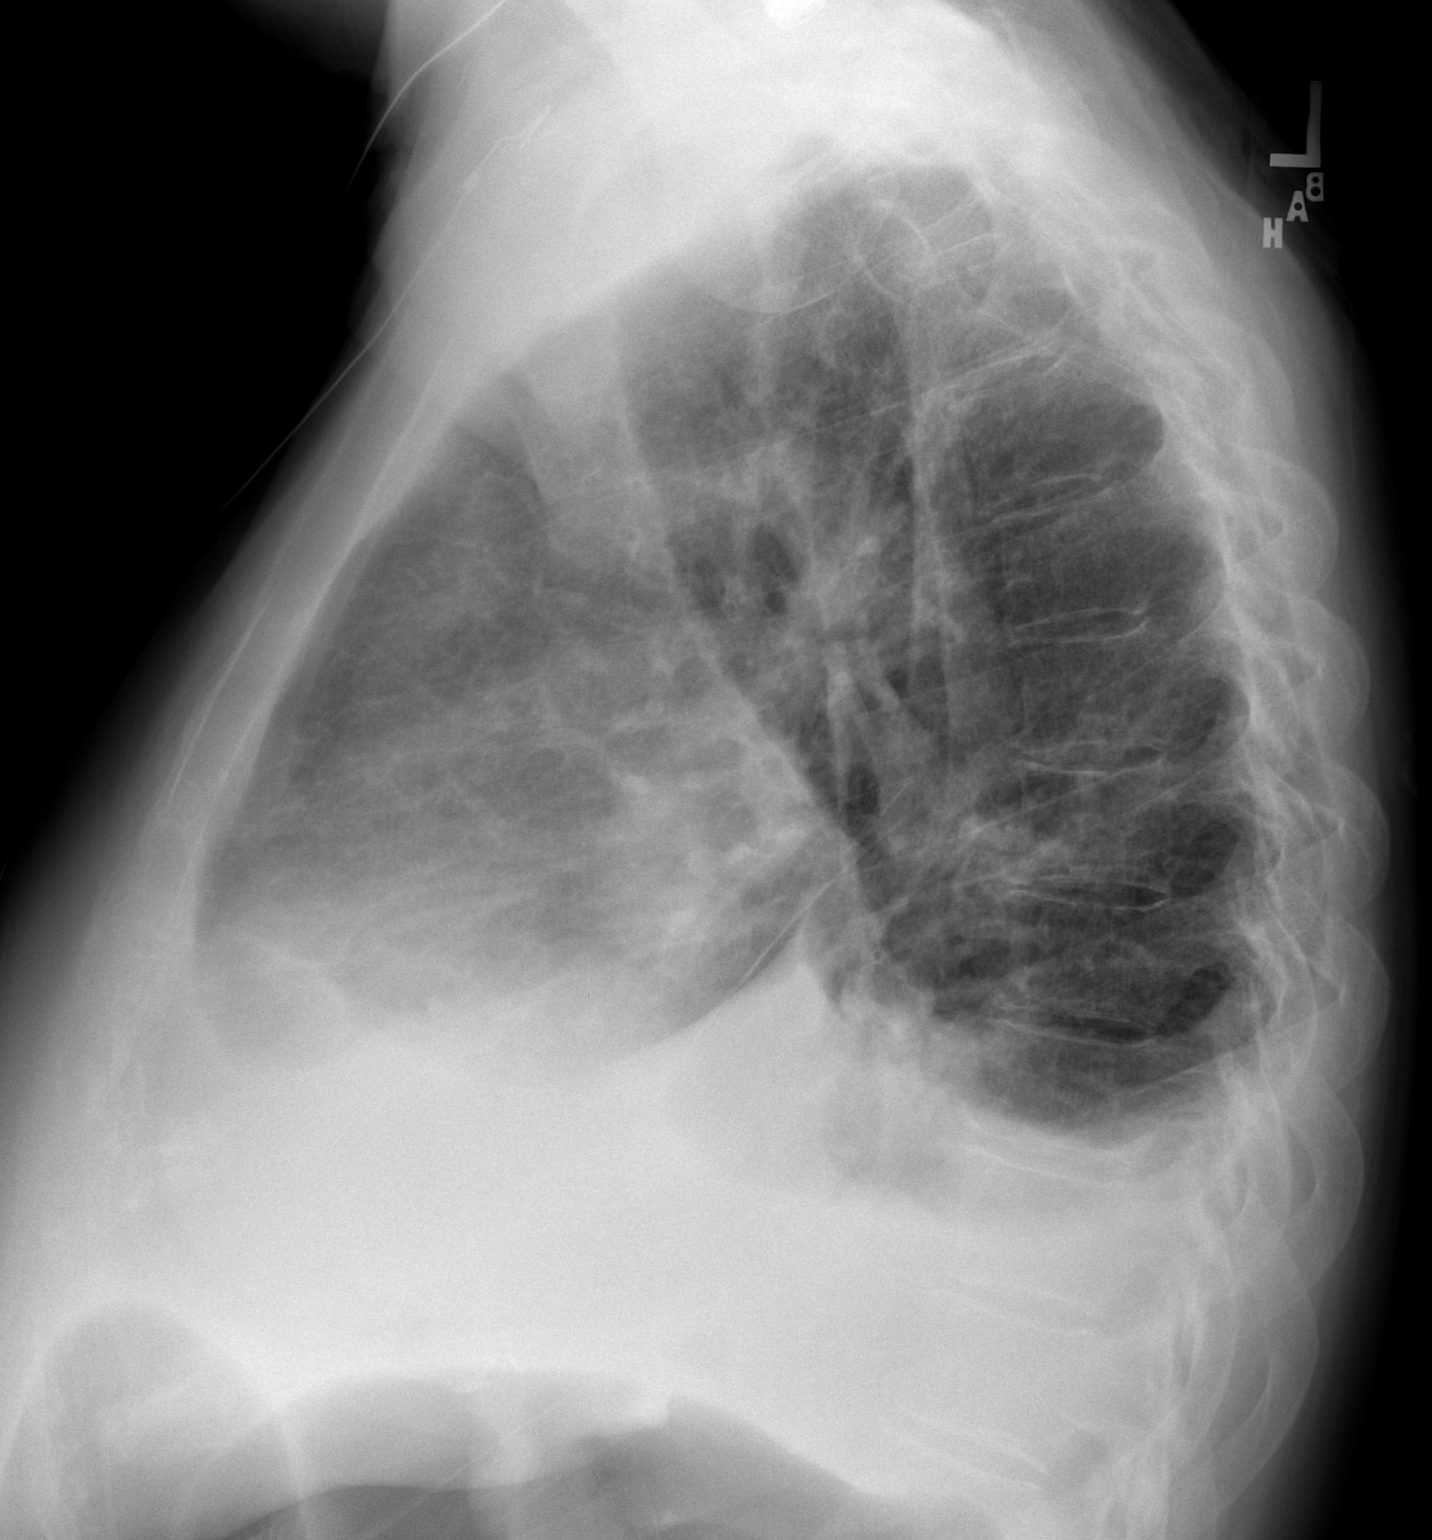

[2 of 2 positions shown; findings below may reference images not displayed]

FINDINGS: Heart is enlarged.  There are bilateral pleural
effusions.  Bibasilar opacities obscuring the hemidiaphragms
bilaterally, consistent with atelectasis or infiltrates.  There are
biapical pleural parenchymal changes right greater than left.
Surgical clips are seen in the GE junction region.
IMPRESSION: 1.  Cardiomegaly.
2.  Bibasilar opacities and pleural effusions, similar to the prior
study.

## 2012-04-29 ENCOUNTER — Ambulatory Visit (INDEPENDENT_AMBULATORY_CARE_PROVIDER_SITE_OTHER): Payer: Medicare Other

## 2012-04-29 DIAGNOSIS — J45909 Unspecified asthma, uncomplicated: Secondary | ICD-10-CM

## 2012-04-29 MED ORDER — OMALIZUMAB 150 MG ~~LOC~~ SOLR
300.0000 mg | Freq: Once | SUBCUTANEOUS | Status: AC
  Administered 2012-04-29: 300 mg via SUBCUTANEOUS

## 2012-05-02 ENCOUNTER — Other Ambulatory Visit: Payer: Self-pay | Admitting: Cardiology

## 2012-05-03 ENCOUNTER — Ambulatory Visit (INDEPENDENT_AMBULATORY_CARE_PROVIDER_SITE_OTHER): Payer: Medicare Other | Admitting: *Deleted

## 2012-05-03 ENCOUNTER — Ambulatory Visit (HOSPITAL_COMMUNITY)
Admission: RE | Admit: 2012-05-03 | Discharge: 2012-05-03 | Disposition: A | Discharge status: Home / Self Care | Payer: Medicare Other | Source: Ambulatory Visit | Attending: Cardiology | Admitting: Cardiology

## 2012-05-03 DIAGNOSIS — I059 Rheumatic mitral valve disease, unspecified: Secondary | ICD-10-CM

## 2012-05-03 DIAGNOSIS — Z7901 Long term (current) use of anticoagulants: Secondary | ICD-10-CM

## 2012-05-03 DIAGNOSIS — I4891 Unspecified atrial fibrillation: Secondary | ICD-10-CM

## 2012-05-03 DIAGNOSIS — I341 Nonrheumatic mitral (valve) prolapse: Secondary | ICD-10-CM

## 2012-05-03 LAB — POCT INR: INR: 2.9

## 2012-05-03 NOTE — Progress Notes (Signed)
*  PRELIMINARY RESULTS* Echocardiogram 2D Echocardiogram has been performed.  [name] 05/03/2012, 9:42 AM

## 2012-05-04 ENCOUNTER — Telehealth: Payer: Self-pay | Admitting: *Deleted

## 2012-05-04 NOTE — Telephone Encounter (Signed)
Message copied by Michae Kava on Tue May 04, 2012 11:30 AM ------      Message from: MCDOWELL, Aloha Gell      Created: Mon May 03, 2012 11:12 AM       Reviewed. LVEF has improved further, now 60-65%. Still with evidence of MVP and moderate mitral regurgitation. Will continue to follow clinically.

## 2012-05-04 NOTE — Telephone Encounter (Signed)
pt notified of echo results and gave me verbal understanding today

## 2012-05-25 ENCOUNTER — Other Ambulatory Visit: Payer: Self-pay | Admitting: Cardiology

## 2012-05-25 MED ORDER — WARFARIN SODIUM 5 MG PO TABS: ORAL_TABLET | ORAL | Status: DC

## 2012-05-25 MED ORDER — DIGOXIN 125 MCG PO TABS: 125.0000 ug | ORAL_TABLET | Freq: Every day | ORAL | Status: DC

## 2012-05-27 ENCOUNTER — Encounter: Payer: Self-pay | Admitting: *Deleted

## 2012-05-28 ENCOUNTER — Ambulatory Visit (INDEPENDENT_AMBULATORY_CARE_PROVIDER_SITE_OTHER): Payer: Medicare Other

## 2012-05-28 DIAGNOSIS — J45909 Unspecified asthma, uncomplicated: Secondary | ICD-10-CM

## 2012-05-31 DIAGNOSIS — J45909 Unspecified asthma, uncomplicated: Secondary | ICD-10-CM

## 2012-05-31 MED ORDER — OMALIZUMAB 150 MG ~~LOC~~ SOLR
300.0000 mg | Freq: Once | SUBCUTANEOUS | Status: AC
  Administered 2012-05-31: 300 mg via SUBCUTANEOUS

## 2012-06-14 ENCOUNTER — Ambulatory Visit (INDEPENDENT_AMBULATORY_CARE_PROVIDER_SITE_OTHER): Payer: Medicare Other | Admitting: *Deleted

## 2012-06-14 DIAGNOSIS — I4891 Unspecified atrial fibrillation: Secondary | ICD-10-CM

## 2012-06-14 DIAGNOSIS — Z7901 Long term (current) use of anticoagulants: Secondary | ICD-10-CM

## 2012-06-14 LAB — POCT INR: INR: 2.7

## 2012-06-25 ENCOUNTER — Ambulatory Visit (INDEPENDENT_AMBULATORY_CARE_PROVIDER_SITE_OTHER): Payer: Medicare Other

## 2012-06-25 DIAGNOSIS — J45909 Unspecified asthma, uncomplicated: Secondary | ICD-10-CM

## 2012-06-28 MED ORDER — OMALIZUMAB 150 MG ~~LOC~~ SOLR
300.0000 mg | Freq: Once | SUBCUTANEOUS | Status: AC
  Administered 2012-06-28: 300 mg via SUBCUTANEOUS

## 2012-07-10 ENCOUNTER — Other Ambulatory Visit: Payer: Self-pay | Admitting: Internal Medicine

## 2012-07-23 ENCOUNTER — Ambulatory Visit: Payer: Medicare Other

## 2012-07-26 ENCOUNTER — Ambulatory Visit (INDEPENDENT_AMBULATORY_CARE_PROVIDER_SITE_OTHER): Payer: Medicare Other | Admitting: *Deleted

## 2012-07-26 DIAGNOSIS — Z7901 Long term (current) use of anticoagulants: Secondary | ICD-10-CM

## 2012-07-26 DIAGNOSIS — I4891 Unspecified atrial fibrillation: Secondary | ICD-10-CM

## 2012-07-26 LAB — POCT INR: INR: 2.4

## 2012-07-30 ENCOUNTER — Ambulatory Visit (INDEPENDENT_AMBULATORY_CARE_PROVIDER_SITE_OTHER): Payer: Medicare Other

## 2012-07-30 DIAGNOSIS — J45909 Unspecified asthma, uncomplicated: Secondary | ICD-10-CM

## 2012-07-30 MED ORDER — OMALIZUMAB 150 MG ~~LOC~~ SOLR
300.0000 mg | Freq: Once | SUBCUTANEOUS | Status: AC
  Administered 2012-07-30: 300 mg via SUBCUTANEOUS

## 2012-08-02 ENCOUNTER — Ambulatory Visit (HOSPITAL_COMMUNITY)
Admission: RE | Admit: 2012-08-02 | Discharge: 2012-08-02 | Disposition: A | Discharge status: Home / Self Care | Payer: Medicare Other | Source: Ambulatory Visit | Attending: Internal Medicine | Admitting: Internal Medicine

## 2012-08-02 DIAGNOSIS — E049 Nontoxic goiter, unspecified: Secondary | ICD-10-CM | POA: Insufficient documentation

## 2012-08-02 DIAGNOSIS — R918 Other nonspecific abnormal finding of lung field: Secondary | ICD-10-CM | POA: Insufficient documentation

## 2012-08-02 DIAGNOSIS — K449 Diaphragmatic hernia without obstruction or gangrene: Secondary | ICD-10-CM | POA: Insufficient documentation

## 2012-08-09 ENCOUNTER — Ambulatory Visit (INDEPENDENT_AMBULATORY_CARE_PROVIDER_SITE_OTHER): Payer: Medicare Other | Admitting: Internal Medicine

## 2012-08-09 ENCOUNTER — Encounter: Payer: Self-pay | Admitting: Internal Medicine

## 2012-08-09 VITALS — BP 118/60 | HR 91 | Ht 67.0 in | Wt 145.8 lb

## 2012-08-09 DIAGNOSIS — J449 Chronic obstructive pulmonary disease, unspecified: Secondary | ICD-10-CM

## 2012-08-09 DIAGNOSIS — F419 Anxiety disorder, unspecified: Secondary | ICD-10-CM

## 2012-08-09 DIAGNOSIS — F411 Generalized anxiety disorder: Secondary | ICD-10-CM

## 2012-08-09 DIAGNOSIS — R918 Other nonspecific abnormal finding of lung field: Secondary | ICD-10-CM

## 2012-08-09 DIAGNOSIS — G4730 Sleep apnea, unspecified: Secondary | ICD-10-CM

## 2012-08-09 DIAGNOSIS — G4710 Hypersomnia, unspecified: Secondary | ICD-10-CM

## 2012-08-09 MED ORDER — DIAZEPAM 2 MG PO TABS: 2.0000 mg | ORAL_TABLET | Freq: Three times a day (TID) | ORAL | Status: AC | PRN

## 2012-08-09 NOTE — Progress Notes (Signed)
Patient ID: [name], female    DOB: Mar 11, 1934, 78 y.o.   MRN: [id number]  HPI 7/113/12-  78 yoF former smoker, followed for Chronic asthma/ COPD, chronic rhinosinusitis, complicated by hx AFib/ anticoagulation, CHF, cardiomyopathy, mitral insufficiency, GERD Last here December 11/19/10- note reviewed She has felt well controlled with no acute breathing issues. She saw Dr Domenic Polite for cardiac f/u 2 weeks ago and Echo was good with no med changes. Uses rescue inhaler only occasionally, usually on first rising. Little wheeze at times, much better. Little DOE with ADLs PFT 12/06/10= chronic obstructive asthma   7/113/12-  78 yoF former smoker, followed for Chronic asthma/ COPD, chronic rhinosinusitis, complicated by hx AFib/ anticoagulation, CHF, cardiomyopathy, mitral insufficiency, GERD Has had flu vaccine. Persistent head and chest congestion for the past 2 weeks. On November 7 we sent  Avelox for 5 days. This helped but didn't last long enough. She took a leftover prednisone taper but wants to avoid more prednisone now if possible. She has noted mild tremor in her hands for the past month no lateralizing finding. She continues Xolair injections. Using nebulizer or rescue inhaler only every 1 or 2 days. Has lost some weight and admits she has been dieting some but otherwise she's not sure that explains it. Thyroid function test normal in the past.  11/27/11- 78 yoF former smoker, followed for Chronic asthma/ COPD, chronic rhinosinusitis, complicated by hx AFib/ anticoagulation, CHF, cardiomyopathy, mitral insufficiency, GERD, Lung nodules Continues to do well. Continues Xolair. She thyroid nodule biopsy by interventional radiology. Waiting for results. Eyes have been watering some increased nasal drainage. We discussed possibility of seasonal allergic symptoms when she is getting Xolair. CT chest 10/20/2011-bilateral nodules less than 10 mm. Needs followup CT scan in May.  02/05/12- 78 yoF  former smoker, followed for Chronic asthma/ COPD, chronic rhinosinusitis, complicated by hx AFib/ anticoagulation, CHF, cardiomyopathy, mitral insufficiency, GERD, Lung nodules Denies any cough, congestion, SOB, or wheezing, Slight flare up with allergies at this time and still on Xolair. CAT score 8 She is pleased to be holding her current weight. Sometimes has a little wheeze but not much rarely needs a rescue inhaler and uses her nebulizer 2 or 3 times per week. Continues Xolair and believes it helps. Denies reflux or heartburn. CT chest  01/29/12- images reviewed with her. IMPRESSION:  1. Scattered bilateral pulmonary nodules are unchanged in size  from 10/22/2011. Additional follow-up CT chest without contrast  could be performed in 6 months to ensure continued stability, as  clinically indicated. This recommendation follows the consensus  statement: Guidelines for Management of Small Pulmonary Nodules  Detected on CT Scans: A Statement from the [facility] as  published in Radiology 2005; 237:395-400. If multidisciplinary  follow up management is desired, this is available in the [facility] through the [facility] [id number]  [id number].  2. Dominant right thyroid nodule. Consider further evaluation with  thyroid ultrasound. If patient is clinically hyperthyroid,  consider nuclear medicine thyroid uptake and scan.  Original Report Authenticated By: Luretha Rued, M.D.    08/09/12- 78 yoF former smoker, followed for Chronic asthma/ COPD, chronic rhinosinusitis, complicated by hx AFib/ anticoagulation, CHF, cardiomyopathy, mitral insufficiency, GERD, Lung nodules FOLLOWS FOR: denies any SOB, wheezing or cough. States she is having chest/throat congestion. For 2 weeks has had "congestion" in throat and chest. Dr. Hilma Favors treated Levaquin and it one week ago. She denies purulent discharge, fever or wheeze. Echocardiogram 05/03/2012-further improved. EF  60-65% with mitral regurgitation and mitral valve prolapse. Chest refill Valium use occasionally. CT 08/04/12-reviewed with her IMPRESSION:  1. Stable bilateral pulmonary nodules. No new pulmonary nodules  are noted. A follow-up examination in 1 year is recommended to  assure stability.  2. Stable 2 cm low density nodule in the right lobe of the thyroid  gland.  3. No acute infiltrate or pulmonary edema. Small hiatal hernia  again noted.  Original Report Authenticated By: Lahoma Crocker, M.D.  Review of Systems-see HPI Constitutional:    weight loss, night sweats, fevers, chills, fatigue, lassitude. HEENT:   No-  headaches, difficulty swallowing, tooth/dental problems, sore throat,       No-  sneezing, itching, ear ache, +nasal congestion, post nasal drip,  CV:  No-   chest pain, orthopnea, PND, swelling in lower extremities, anasarca, dizziness, palpitations Resp: No- acute  shortness of breath with exertion or at rest.              No-   productive cough,  + non-productive cough,  No- coughing up of blood.              No-   change in color of mucus.  Little wheezing.   Skin: No-   rash or lesions. GI:  No-   heartburn, indigestion, abdominal pain, nausea, vomiting,  GU: . MS:  No-   joint pain or swelling.  Neuro-     nothing unusual Psych:  No- change in mood or affect. No depression or anxiety.  No memory loss.  Objective:   Physical Exam General- Alert, Oriented, Affect-appropriate/ cheerful, Distress- none acute, thin Skin- rash-none, lesions- none, excoriation- none Lymphadenopathy- none Head- atraumatic            Eyes- Gross vision intact, PERRLA, conjunctivae clear secretions            Ears- Hearing, canals-normal            Nose- turbinate edema, no-Septal dev, mucus, polyps, erosion, perforation             Throat- Mallampati II , mucosa clear/ not red , drainage- none, tonsils- atrophic, dentures.+ Mild hoarseness Neck- flexible , trachea midline, no stridor , thyroid  nl, carotid no bruit Chest - symmetrical excursion , unlabored           Heart/CV- IRR/ AFib well controlled , no murmur , no gallop  , no rub, nl s1 s2                           - JVD- none , edema- none, stasis changes- none, varices- none           Lung- clear, wheeze- none, cough- none , dullness-none, rub- none           Chest wall-  Abd-  Br/ Gen/ Rectal- Not done, not indicated Extrem- cyanosis- none, clubbing, none, atrophy- none, strength- nl Neuro- slight resting tremor

## 2012-08-09 NOTE — Patient Instructions (Addendum)
Script for valium refilled. Use sparingly  Try mucinex to thin the mucus in your throat  When I see you in the Spring, I will schedule ahead for your next CT to be done next Fall

## 2012-08-20 DIAGNOSIS — F419 Anxiety disorder, unspecified: Secondary | ICD-10-CM | POA: Insufficient documentation

## 2012-08-20 NOTE — Assessment & Plan Note (Signed)
We discussed recommended followup for her nonspecific small nodules, and also stable nodule in her thyroid since that would be managed by her primary physician

## 2012-08-20 NOTE — Assessment & Plan Note (Signed)
Exacerbation, probably URI with mild bronchitis and laryngitis. Nonspecific. She will stick with conservative therapy, throat lozenges, fluids, Mucinex.

## 2012-08-20 NOTE — Assessment & Plan Note (Signed)
She asks we refill low-dose Valium for very occasional use. We discussed this Plan-Valium 2 mg

## 2012-08-20 NOTE — Assessment & Plan Note (Signed)
After she sustained weight loss a few years ago, she has been comfortable without CPAP and sleeps well.

## 2012-08-27 ENCOUNTER — Ambulatory Visit (INDEPENDENT_AMBULATORY_CARE_PROVIDER_SITE_OTHER): Payer: Medicare Other

## 2012-08-27 DIAGNOSIS — J45909 Unspecified asthma, uncomplicated: Secondary | ICD-10-CM

## 2012-08-30 MED ORDER — OMALIZUMAB 150 MG ~~LOC~~ SOLR
300.0000 mg | Freq: Once | SUBCUTANEOUS | Status: AC
  Administered 2012-08-30: 300 mg via SUBCUTANEOUS

## 2012-09-06 ENCOUNTER — Other Ambulatory Visit (HOSPITAL_COMMUNITY): Payer: Self-pay | Admitting: Family Medicine

## 2012-09-06 ENCOUNTER — Other Ambulatory Visit: Payer: Self-pay | Admitting: *Deleted

## 2012-09-06 ENCOUNTER — Ambulatory Visit (INDEPENDENT_AMBULATORY_CARE_PROVIDER_SITE_OTHER): Payer: Medicare Other | Admitting: *Deleted

## 2012-09-06 ENCOUNTER — Ambulatory Visit (HOSPITAL_COMMUNITY)
Admission: RE | Admit: 2012-09-06 | Discharge: 2012-09-06 | Disposition: A | Discharge status: Home / Self Care | Payer: Medicare Other | Source: Ambulatory Visit | Attending: Family Medicine | Admitting: Family Medicine

## 2012-09-06 DIAGNOSIS — R221 Localized swelling, mass and lump, neck: Secondary | ICD-10-CM

## 2012-09-06 DIAGNOSIS — I4891 Unspecified atrial fibrillation: Secondary | ICD-10-CM

## 2012-09-06 DIAGNOSIS — Z7901 Long term (current) use of anticoagulants: Secondary | ICD-10-CM

## 2012-09-06 DIAGNOSIS — R220 Localized swelling, mass and lump, head: Secondary | ICD-10-CM

## 2012-09-06 DIAGNOSIS — E041 Nontoxic single thyroid nodule: Secondary | ICD-10-CM | POA: Insufficient documentation

## 2012-09-06 LAB — PROTIME-INR
INR: 5.12 (ref <1.50)
Prothrombin Time: 43.9 seconds — ABNORMAL HIGH (ref 11.6–15.2)

## 2012-09-06 LAB — POCT INR: INR: 6.7

## 2012-09-06 LAB — APTT: aPTT: 52.4 seconds — ABNORMAL HIGH (ref 24–37)

## 2012-09-07 ENCOUNTER — Telehealth: Payer: Self-pay | Admitting: *Deleted

## 2012-09-07 NOTE — Telephone Encounter (Signed)
Pt is on Oct recall for [place]

## 2012-09-07 NOTE — Telephone Encounter (Signed)
Patient had F/U CT Nov 2013 that results states next CT Nov 2014 reminder will be sent to the patient then

## 2012-09-14 ENCOUNTER — Other Ambulatory Visit: Payer: Self-pay | Admitting: Cardiology

## 2012-09-15 ENCOUNTER — Ambulatory Visit (INDEPENDENT_AMBULATORY_CARE_PROVIDER_SITE_OTHER): Payer: Medicare Other | Admitting: *Deleted

## 2012-09-15 DIAGNOSIS — I4891 Unspecified atrial fibrillation: Secondary | ICD-10-CM

## 2012-09-15 DIAGNOSIS — Z7901 Long term (current) use of anticoagulants: Secondary | ICD-10-CM

## 2012-09-15 LAB — POCT INR: INR: 3.7

## 2012-09-16 ENCOUNTER — Other Ambulatory Visit (HOSPITAL_COMMUNITY): Payer: Self-pay | Admitting: Otolaryngology

## 2012-09-16 DIAGNOSIS — E041 Nontoxic single thyroid nodule: Secondary | ICD-10-CM

## 2012-09-24 ENCOUNTER — Ambulatory Visit (INDEPENDENT_AMBULATORY_CARE_PROVIDER_SITE_OTHER): Payer: Medicare Other

## 2012-09-24 DIAGNOSIS — J45909 Unspecified asthma, uncomplicated: Secondary | ICD-10-CM

## 2012-09-24 MED ORDER — OMALIZUMAB 150 MG ~~LOC~~ SOLR
300.0000 mg | Freq: Once | SUBCUTANEOUS | Status: AC
  Administered 2012-09-24: 300 mg via SUBCUTANEOUS

## 2012-09-29 ENCOUNTER — Ambulatory Visit (INDEPENDENT_AMBULATORY_CARE_PROVIDER_SITE_OTHER): Payer: Medicare Other | Admitting: *Deleted

## 2012-09-29 DIAGNOSIS — Z7901 Long term (current) use of anticoagulants: Secondary | ICD-10-CM

## 2012-09-29 DIAGNOSIS — I4891 Unspecified atrial fibrillation: Secondary | ICD-10-CM

## 2012-10-13 ENCOUNTER — Ambulatory Visit (INDEPENDENT_AMBULATORY_CARE_PROVIDER_SITE_OTHER): Payer: Medicare Other | Admitting: *Deleted

## 2012-10-13 DIAGNOSIS — Z7901 Long term (current) use of anticoagulants: Secondary | ICD-10-CM

## 2012-10-13 DIAGNOSIS — I4891 Unspecified atrial fibrillation: Secondary | ICD-10-CM

## 2012-10-22 ENCOUNTER — Ambulatory Visit: Payer: Medicare Other

## 2012-10-26 ENCOUNTER — Ambulatory Visit (INDEPENDENT_AMBULATORY_CARE_PROVIDER_SITE_OTHER): Payer: Medicare Other

## 2012-10-26 DIAGNOSIS — J45909 Unspecified asthma, uncomplicated: Secondary | ICD-10-CM

## 2012-10-26 MED ORDER — OMALIZUMAB 150 MG ~~LOC~~ SOLR
300.0000 mg | Freq: Once | SUBCUTANEOUS | Status: AC
  Administered 2012-10-26: 300 mg via SUBCUTANEOUS

## 2012-10-27 ENCOUNTER — Other Ambulatory Visit (HOSPITAL_COMMUNITY): Payer: Self-pay | Admitting: Family Medicine

## 2012-10-27 ENCOUNTER — Ambulatory Visit (INDEPENDENT_AMBULATORY_CARE_PROVIDER_SITE_OTHER): Payer: Medicare Other | Admitting: *Deleted

## 2012-10-27 DIAGNOSIS — Z7901 Long term (current) use of anticoagulants: Secondary | ICD-10-CM

## 2012-11-08 ENCOUNTER — Ambulatory Visit (HOSPITAL_COMMUNITY)
Admission: RE | Admit: 2012-11-08 | Discharge: 2012-11-08 | Disposition: A | Discharge status: Home / Self Care | Payer: Medicare Other | Source: Ambulatory Visit | Attending: Family Medicine | Admitting: Family Medicine

## 2012-11-08 ENCOUNTER — Ambulatory Visit (HOSPITAL_COMMUNITY)
Admission: RE | Admit: 2012-11-08 | Discharge: 2012-11-08 | Disposition: A | Discharge status: Home / Self Care | Payer: Medicare Other | Source: Ambulatory Visit | Attending: Otolaryngology | Admitting: Otolaryngology

## 2012-11-08 DIAGNOSIS — E042 Nontoxic multinodular goiter: Secondary | ICD-10-CM | POA: Insufficient documentation

## 2012-11-08 DIAGNOSIS — Z1231 Encounter for screening mammogram for malignant neoplasm of breast: Secondary | ICD-10-CM | POA: Insufficient documentation

## 2012-11-10 ENCOUNTER — Ambulatory Visit (INDEPENDENT_AMBULATORY_CARE_PROVIDER_SITE_OTHER): Payer: Medicare Other | Admitting: Cardiology

## 2012-11-10 ENCOUNTER — Other Ambulatory Visit: Payer: Self-pay | Admitting: Otolaryngology

## 2012-11-10 ENCOUNTER — Ambulatory Visit (INDEPENDENT_AMBULATORY_CARE_PROVIDER_SITE_OTHER): Payer: Medicare Other | Admitting: *Deleted

## 2012-11-10 ENCOUNTER — Encounter: Payer: Self-pay | Admitting: Cardiology

## 2012-11-10 VITALS — BP 128/68 | HR 80 | Ht 67.0 in | Wt 146.8 lb

## 2012-11-10 DIAGNOSIS — I059 Rheumatic mitral valve disease, unspecified: Secondary | ICD-10-CM

## 2012-11-10 DIAGNOSIS — E042 Nontoxic multinodular goiter: Secondary | ICD-10-CM

## 2012-11-10 DIAGNOSIS — I4891 Unspecified atrial fibrillation: Secondary | ICD-10-CM

## 2012-11-10 DIAGNOSIS — I429 Cardiomyopathy, unspecified: Secondary | ICD-10-CM

## 2012-11-10 NOTE — Patient Instructions (Addendum)
Your physician recommends that you schedule a follow-up appointment in: [place]  Your physician has requested that you have an echocardiogram. Echocardiography is a painless test that uses sound waves to create images of your heart. It provides your doctor with information about the size and shape of your heart and how well your heart's chambers and valves are working. This procedure takes approximately one hour. There are no restrictions for this procedure.

## 2012-11-10 NOTE — Assessment & Plan Note (Signed)
Continue strategy of heart rate control and anticoagulation. ECG reviewed today shows atrial fibrillation with good control. No change in current regimen.

## 2012-11-10 NOTE — Assessment & Plan Note (Signed)
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

## 2012-11-10 NOTE — Progress Notes (Signed)
Clinical Summary Ms. Punter is a 79 y.o.female presenting for followup. She was last seen in August 2013. She reports NYHA class I dyspnea, no sense of palpitations or chest pain. She has done very well on medical therapy. No bleeding problems on Coumadin.  Echocardiogram from August 2013 revealed LVEF 60-65%, MVP of moderate degree with moderate mitral regurgitation, moderate left atrial enlargement. LV systolic function has normalized, and we continue to follow her MVP with mitral regurgitation.   Allergies  Allergen Reactions  . Amoxicillin-Pot Clavulanate   . Clarithromycin   . Clindamycin   . Doxycycline   . Penicillins     Current Outpatient Prescriptions  Medication Sig Dispense Refill  . albuterol (PROVENTIL) (2.5 MG/3ML) 0.083% nebulizer solution Take 3 mLs (2.5 mg total) by nebulization every 4 (four) hours as needed for wheezing or shortness of breath.  120 mL  12  . atorvastatin (LIPITOR) 10 MG tablet Take 10 mg by mouth daily.        . Calcium Carbonate-Vitamin D (CALTRATE 600+D) 600-400 MG-UNIT per tablet Take 1 tablet by mouth daily.        . citalopram (CELEXA) 20 MG tablet Take 20 mg by mouth daily.       . diazepam (VALIUM) 2 MG tablet Take 1 tablet (2 mg total) by mouth every 8 (eight) hours as needed for anxiety.  30 tablet  5  . digoxin (LANOXIN) 0.125 MG tablet Take 1 tablet (125 mcg total) by mouth daily.  90 tablet  3  . diltiazem (CARDIZEM CD) 240 MG 24 hr capsule Take 1 capsule (240 mg total) by mouth daily.  90 capsule  30  . EPINEPHrine (EPI-PEN) 0.3 MG/0.3ML DEVI Inject 0.3 mg into the muscle as needed.        Marland Kitchen esomeprazole (NEXIUM) 40 MG capsule Take 40 mg by mouth 2 (two) times daily.        . furosemide (LASIX) 40 MG tablet Take 1 tablet (40 mg total) by mouth daily.  30 tablet  10  . mometasone (NASONEX) 50 MCG/ACT nasal spray Place 2 sprays into the nose as needed.       Marland Kitchen omalizumab (XOLAIR) 150 MG injection 300 mg once every 28 days  900 mg  3  .  potassium chloride SA (KLOR-CON M20) 20 MEQ tablet Take 1 tablet (20 mEq total) by mouth daily.  30 tablet  6  . PROAIR HFA 108 (90 BASE) MCG/ACT inhaler INHALE 1-2 PUFFS EVERY 4 TO 6 HOURS AS NEEDED FOR WHEEZING  8.5 each  1  . warfarin (COUMADIN) 5 MG tablet TAKE 1 TABLET DAILY OR AS DIRECTED  45 tablet  2   No current facility-administered medications for this visit.    Past Medical History  Diagnosis Date  . Asthma   . Atrial fibrillation   . COPD (chronic obstructive pulmonary disease)   . GERD (gastroesophageal reflux disease)   . Mitral valve prolapse   . Nonischemic cardiomyopathy     LVEF 25-30% improved to 45-50%  . Chronic rhinitis   . Obstructive sleep apnea   . Transudative pleural effusion   . Nasal polyps   . Mitral regurgitation     Mild to moderate 5/12    Social History Ms. Leon reports that she quit smoking about 32 years ago. Her smoking use included Cigarettes. She has a 10 pack-year smoking history. She has never used smokeless tobacco. Ms. Tannenbaum reports that she does not drink alcohol.  Review of Systems  Has had intermittent wheezing with the cold weather, still follows with Dr. Annamaria Boots. Also thyroid nodules that are being followed. Otherwise negative.  Physical Examination Filed Vitals:   11/10/12 0834  BP: 128/68  Pulse: 80   Filed Weights   11/10/12 0834  Weight: 146 lb 12.8 oz (66.588 kg)    Thin elderly woman in no acute distress.  HEENT: Lids normal, conjunctiva normal, oropharynx clear with moist mucosa.  Neck: Supple, no elevated JVP or carotid bruits, no thyromegaly.  Lungs: Diminished but clear breath sounds, no wheezing or labored breathing.  Cardiac: Irregularly irregular, very soft apical systolic murmur, no pericardial rub.  Abdomen: Soft, nontender, bowel sound present.  Skin: Warm and dry.  Extremities: No pitting edema below the knees.  Musculoskeletal: No kyphosis.  Neuropsychiatric: Alert and oriented x3, affect  appropriate.   Problem List and Plan   Atrial fibrillation Continue strategy of heart rate control and anticoagulation. ECG reviewed today shows atrial fibrillation with good control. No change in current regimen.  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  MITRAL VALVE PROLAPSE Moderate, associated with moderate mitral regurgitation. Followup echocardiogram to be arranged for her next visit in 6 months.    Satira Sark, M.D., F.A.C.C.

## 2012-11-10 NOTE — Assessment & Plan Note (Signed)
Moderate, associated with moderate mitral regurgitation. Followup echocardiogram to be arranged for her next visit in 6 months.

## 2012-11-11 ENCOUNTER — Other Ambulatory Visit: Payer: Self-pay | Admitting: Otolaryngology

## 2012-11-11 DIAGNOSIS — E041 Nontoxic single thyroid nodule: Secondary | ICD-10-CM

## 2012-11-20 ENCOUNTER — Other Ambulatory Visit: Payer: Self-pay | Admitting: Cardiology

## 2012-11-22 ENCOUNTER — Ambulatory Visit (INDEPENDENT_AMBULATORY_CARE_PROVIDER_SITE_OTHER): Payer: Medicare Other

## 2012-11-22 ENCOUNTER — Ambulatory Visit (INDEPENDENT_AMBULATORY_CARE_PROVIDER_SITE_OTHER): Payer: Medicare Other | Admitting: *Deleted

## 2012-11-22 DIAGNOSIS — Z7901 Long term (current) use of anticoagulants: Secondary | ICD-10-CM

## 2012-11-22 DIAGNOSIS — I4891 Unspecified atrial fibrillation: Secondary | ICD-10-CM

## 2012-11-22 MED ORDER — OMALIZUMAB 150 MG ~~LOC~~ SOLR
300.0000 mg | Freq: Once | SUBCUTANEOUS | Status: AC
  Administered 2012-11-22: 300 mg via SUBCUTANEOUS

## 2012-11-23 ENCOUNTER — Ambulatory Visit: Payer: Medicare Other

## 2012-11-23 ENCOUNTER — Inpatient Hospital Stay
Admission: RE | Admit: 2012-11-23 | Discharge: 2012-11-23 | Disposition: A | Discharge status: Home / Self Care | Payer: Medicare Other | Source: Ambulatory Visit | Attending: Otolaryngology | Admitting: Otolaryngology

## 2012-11-23 ENCOUNTER — Inpatient Hospital Stay: Admission: RE | Admit: 2012-11-23 | Payer: Medicare Other | Source: Ambulatory Visit

## 2012-11-24 ENCOUNTER — Ambulatory Visit
Admission: RE | Admit: 2012-11-24 | Discharge: 2012-11-24 | Disposition: A | Discharge status: Home / Self Care | Payer: Medicare Other | Source: Ambulatory Visit | Attending: Otolaryngology | Admitting: Otolaryngology

## 2012-11-24 ENCOUNTER — Other Ambulatory Visit (HOSPITAL_COMMUNITY)
Admission: RE | Admit: 2012-11-24 | Discharge: 2012-11-24 | Disposition: A | Discharge status: Home / Self Care | Payer: Medicare Other | Source: Ambulatory Visit | Attending: Diagnostic Radiology | Admitting: Diagnostic Radiology

## 2012-11-24 DIAGNOSIS — E042 Nontoxic multinodular goiter: Secondary | ICD-10-CM

## 2012-11-24 DIAGNOSIS — E041 Nontoxic single thyroid nodule: Secondary | ICD-10-CM

## 2012-11-29 ENCOUNTER — Ambulatory Visit (INDEPENDENT_AMBULATORY_CARE_PROVIDER_SITE_OTHER): Payer: Medicare Other | Admitting: *Deleted

## 2012-11-29 DIAGNOSIS — Z7901 Long term (current) use of anticoagulants: Secondary | ICD-10-CM

## 2012-11-29 DIAGNOSIS — I4891 Unspecified atrial fibrillation: Secondary | ICD-10-CM

## 2012-12-06 ENCOUNTER — Ambulatory Visit (INDEPENDENT_AMBULATORY_CARE_PROVIDER_SITE_OTHER): Payer: Medicare Other | Admitting: *Deleted

## 2012-12-06 DIAGNOSIS — I4891 Unspecified atrial fibrillation: Secondary | ICD-10-CM

## 2012-12-06 DIAGNOSIS — Z7901 Long term (current) use of anticoagulants: Secondary | ICD-10-CM

## 2012-12-09 ENCOUNTER — Other Ambulatory Visit: Payer: Self-pay | Admitting: Cardiology

## 2012-12-09 ENCOUNTER — Other Ambulatory Visit: Payer: Self-pay | Admitting: Internal Medicine

## 2012-12-09 MED ORDER — DILTIAZEM HCL ER COATED BEADS 240 MG PO CP24: 240.0000 mg | ORAL_CAPSULE | Freq: Every day | ORAL | Status: DC

## 2012-12-17 ENCOUNTER — Other Ambulatory Visit: Payer: Self-pay | Admitting: Cardiology

## 2012-12-22 ENCOUNTER — Ambulatory Visit: Payer: Medicare Other

## 2012-12-27 ENCOUNTER — Ambulatory Visit (INDEPENDENT_AMBULATORY_CARE_PROVIDER_SITE_OTHER): Payer: Medicare Other | Admitting: *Deleted

## 2012-12-27 ENCOUNTER — Encounter: Payer: Self-pay | Admitting: *Deleted

## 2012-12-27 DIAGNOSIS — I4891 Unspecified atrial fibrillation: Secondary | ICD-10-CM

## 2012-12-27 DIAGNOSIS — Z7901 Long term (current) use of anticoagulants: Secondary | ICD-10-CM

## 2012-12-28 ENCOUNTER — Ambulatory Visit (INDEPENDENT_AMBULATORY_CARE_PROVIDER_SITE_OTHER): Payer: Medicare Other

## 2012-12-28 DIAGNOSIS — J45909 Unspecified asthma, uncomplicated: Secondary | ICD-10-CM

## 2012-12-29 ENCOUNTER — Ambulatory Visit (INDEPENDENT_AMBULATORY_CARE_PROVIDER_SITE_OTHER): Payer: Medicare Other | Admitting: *Deleted

## 2012-12-29 DIAGNOSIS — I4891 Unspecified atrial fibrillation: Secondary | ICD-10-CM

## 2012-12-29 DIAGNOSIS — Z7901 Long term (current) use of anticoagulants: Secondary | ICD-10-CM

## 2012-12-29 MED ORDER — OMALIZUMAB 150 MG ~~LOC~~ SOLR
300.0000 mg | Freq: Once | SUBCUTANEOUS | Status: AC
  Administered 2012-12-29: 300 mg via SUBCUTANEOUS

## 2013-01-05 ENCOUNTER — Ambulatory Visit: Payer: Medicare Other | Admitting: Orthopedic Surgery

## 2013-01-10 ENCOUNTER — Ambulatory Visit (INDEPENDENT_AMBULATORY_CARE_PROVIDER_SITE_OTHER): Payer: Medicare Other | Admitting: *Deleted

## 2013-01-10 DIAGNOSIS — Z7901 Long term (current) use of anticoagulants: Secondary | ICD-10-CM

## 2013-01-10 DIAGNOSIS — I4891 Unspecified atrial fibrillation: Secondary | ICD-10-CM

## 2013-01-10 LAB — POCT INR: INR: 3.8

## 2013-01-17 ENCOUNTER — Other Ambulatory Visit: Payer: Self-pay | Admitting: Adult Health

## 2013-01-18 ENCOUNTER — Other Ambulatory Visit: Payer: Self-pay | Admitting: Cardiology

## 2013-01-18 ENCOUNTER — Other Ambulatory Visit: Payer: Self-pay | Admitting: Internal Medicine

## 2013-01-19 ENCOUNTER — Ambulatory Visit: Payer: Medicare Other | Admitting: Orthopedic Surgery

## 2013-01-25 ENCOUNTER — Ambulatory Visit (INDEPENDENT_AMBULATORY_CARE_PROVIDER_SITE_OTHER): Payer: Medicare Other

## 2013-01-25 DIAGNOSIS — J45909 Unspecified asthma, uncomplicated: Secondary | ICD-10-CM

## 2013-01-26 MED ORDER — OMALIZUMAB 150 MG ~~LOC~~ SOLR
300.0000 mg | Freq: Once | SUBCUTANEOUS | Status: AC
  Administered 2013-01-26: 300 mg via SUBCUTANEOUS

## 2013-01-27 ENCOUNTER — Ambulatory Visit (INDEPENDENT_AMBULATORY_CARE_PROVIDER_SITE_OTHER): Payer: Medicare Other | Admitting: *Deleted

## 2013-01-27 DIAGNOSIS — I4891 Unspecified atrial fibrillation: Secondary | ICD-10-CM

## 2013-01-27 DIAGNOSIS — Z7901 Long term (current) use of anticoagulants: Secondary | ICD-10-CM

## 2013-01-27 LAB — POCT INR: INR: 2.1

## 2013-02-07 ENCOUNTER — Ambulatory Visit (INDEPENDENT_AMBULATORY_CARE_PROVIDER_SITE_OTHER)
Admission: RE | Admit: 2013-02-07 | Discharge: 2013-02-07 | Disposition: A | Discharge status: Home / Self Care | Payer: Medicare Other | Source: Ambulatory Visit | Attending: Internal Medicine | Admitting: Internal Medicine

## 2013-02-07 ENCOUNTER — Ambulatory Visit (INDEPENDENT_AMBULATORY_CARE_PROVIDER_SITE_OTHER): Payer: Medicare Other | Admitting: Internal Medicine

## 2013-02-07 ENCOUNTER — Encounter: Payer: Self-pay | Admitting: Internal Medicine

## 2013-02-07 VITALS — BP 110/72 | HR 63 | Ht 66.0 in | Wt 149.6 lb

## 2013-02-07 DIAGNOSIS — J301 Allergic rhinitis due to pollen: Secondary | ICD-10-CM

## 2013-02-07 DIAGNOSIS — R918 Other nonspecific abnormal finding of lung field: Secondary | ICD-10-CM

## 2013-02-07 NOTE — Patient Instructions (Addendum)
Order- CXR- hx lung nodules  Order future schedule CT chest, non-contrast, dx lung nodules     To be done in November before next OV

## 2013-02-07 NOTE — Progress Notes (Signed)
Patient ID: [name], female    DOB: Mar 11, 1934, 79 y.o.   MRN: [id number]  HPI 7/113/12-  76 yoF former smoker, followed for Chronic asthma/ COPD, chronic rhinosinusitis, complicated by hx AFib/ anticoagulation, CHF, cardiomyopathy, mitral insufficiency, GERD Last here December 11/19/10- note reviewed She has felt well controlled with no acute breathing issues. She saw Dr Domenic Polite for cardiac f/u 2 weeks ago and Echo was good with no med changes. Uses rescue inhaler only occasionally, usually on first rising. Little wheeze at times, much better. Little DOE with ADLs PFT 12/06/10= chronic obstructive asthma   7/113/12-  76 yoF former smoker, followed for Chronic asthma/ COPD, chronic rhinosinusitis, complicated by hx AFib/ anticoagulation, CHF, cardiomyopathy, mitral insufficiency, GERD Has had flu vaccine. Persistent head and chest congestion for the past 2 weeks. On November 7 we sent  Avelox for 5 days. This helped but didn't last long enough. She took a leftover prednisone taper but wants to avoid more prednisone now if possible. She has noted mild tremor in her hands for the past month no lateralizing finding. She continues Xolair injections. Using nebulizer or rescue inhaler only every 1 or 2 days. Has lost some weight and admits she has been dieting some but otherwise she's not sure that explains it. Thyroid function test normal in the past.  11/27/11- 76 yoF former smoker, followed for Chronic asthma/ COPD, chronic rhinosinusitis, complicated by hx AFib/ anticoagulation, CHF, cardiomyopathy, mitral insufficiency, GERD, Lung nodules Continues to do well. Continues Xolair. She thyroid nodule biopsy by interventional radiology. Waiting for results. Eyes have been watering some increased nasal drainage. We discussed possibility of seasonal allergic symptoms when she is getting Xolair. CT chest 10/20/2011-bilateral nodules less than 10 mm. Needs followup CT scan in May.  02/05/12- 76 yoF  former smoker, followed for Chronic asthma/ COPD, chronic rhinosinusitis, complicated by hx AFib/ anticoagulation, CHF, cardiomyopathy, mitral insufficiency, GERD, Lung nodules Denies any cough, congestion, SOB, or wheezing, Slight flare up with allergies at this time and still on Xolair. CAT score 8 She is pleased to be holding her current weight. Sometimes has a little wheeze but not much rarely needs a rescue inhaler and uses her nebulizer 2 or 3 times per week. Continues Xolair and believes it helps. Denies reflux or heartburn. CT chest  01/29/12- images reviewed with her. IMPRESSION:  1. Scattered bilateral pulmonary nodules are unchanged in size  from 10/22/2011. Additional follow-up CT chest without contrast  could be performed in 6 months to ensure continued stability, as  clinically indicated. This recommendation follows the consensus  statement: Guidelines for Management of Small Pulmonary Nodules  Detected on CT Scans: A Statement from the [facility] as  published in Radiology 2005; 237:395-400. If multidisciplinary  follow up management is desired, this is available in the [facility] through the [facility] [id number]-  3200.  2. Dominant right thyroid nodule. Consider further evaluation with  thyroid ultrasound. If patient is clinically hyperthyroid,  consider nuclear medicine thyroid uptake and scan.  Original Report Authenticated By: Luretha Rued, M.D.    08/09/12- 43 yoF former smoker, followed for Chronic asthma/ COPD, chronic rhinosinusitis, complicated by hx AFib/ anticoagulation, CHF, cardiomyopathy, mitral insufficiency, GERD, Lung nodules FOLLOWS FOR: denies any SOB, wheezing or cough. States she is having chest/throat congestion. For 2 weeks has had "congestion" in throat and chest. Dr. Hilma Favors treated Levaquin and it one week ago. She denies purulent discharge, fever or wheeze. Echocardiogram 05/03/2012-further improved. EF  60-65% with mitral regurgitation and mitral valve prolapse. Chest refill Valium use occasionally. CT 08/04/12-reviewed with her IMPRESSION:  1. Stable bilateral pulmonary nodules. No new pulmonary nodules  are noted. A follow-up examination in 1 year is recommended to  assure stability.  2. Stable 2 cm low density nodule in the right lobe of the thyroid  gland.  3. No acute infiltrate or pulmonary edema. Small hiatal hernia  again noted.  Original Report Authenticated By: Lahoma Crocker, M.D.  02/07/13- 82 yoF former smoker, followed for Chronic asthma/ COPD, chronic rhinosinusitis, complicated by hx AFib/ anticoagulation, CHF, cardiomyopathy, mitral insufficiency, GERD, Lung nodules FOLLOWS FOR: still on Xolair; drainage in throat. Denies any flare ups with asthma or allergies at this time. Taking Zyrtec. Some postnasal drip. Had thyroid nodule biopsied.  Review of Systems-see HPI Constitutional:    weight loss, night sweats, fevers, chills, fatigue, lassitude. HEENT:   No-  headaches, difficulty swallowing, tooth/dental problems, sore throat,       No-  sneezing, itching, ear ache, +less nasal congestion, +post nasal drip,  CV:  No-   chest pain, orthopnea, PND, swelling in lower extremities, anasarca, dizziness, palpitations Resp: No- acute  shortness of breath with exertion or at rest.              No-   productive cough,  No- non-productive cough,  No- coughing up of blood.              No-   change in color of mucus.  Little wheezing.   Skin: No-   rash or lesions. GI:  No-   heartburn, indigestion, abdominal pain, nausea, vomiting,  GU: . MS:  No-   joint pain or swelling.  Neuro-     nothing unusual Psych:  No- change in mood or affect. No depression or anxiety.  No memory loss.  Objective:   Physical Exam General- Alert, Oriented, Affect-appropriate/ cheerful, Distress- none acute, thin Skin- rash-none, lesions- none, excoriation- none Lymphadenopathy- none Head- atraumatic             Eyes- Gross vision intact, PERRLA, conjunctivae clear secretions            Ears- Hearing, canals-normal            Nose- turbinate edema, no-Septal dev, mucus, polyps, erosion, perforation             Throat- Mallampati II , mucosa clear/ not red , drainage- none, tonsils- atrophic,                          dentures.+ Mild hoarseness .+ Large torus hard palate Neck- flexible , trachea midline, no stridor , thyroid nl, carotid no bruit Chest - symmetrical excursion , unlabored           Heart/CV- +IRR/ AFib well controlled , + faint ? Mitral regurg murmur , no gallop  , no rub, nl s1 s2                           - JVD- none , edema- none, stasis changes- none, varices- none           Lung- clear, wheeze- none, cough- none , dullness-none, rub- none           Chest wall-  Abd-  Br/ Gen/ Rectal- Not done, not indicated Extrem- cyanosis- none, clubbing, none, atrophy- none, strength- nl Neuro- slight resting tremor

## 2013-02-10 ENCOUNTER — Ambulatory Visit (INDEPENDENT_AMBULATORY_CARE_PROVIDER_SITE_OTHER): Payer: Medicare Other | Admitting: *Deleted

## 2013-02-10 DIAGNOSIS — I4891 Unspecified atrial fibrillation: Secondary | ICD-10-CM

## 2013-02-10 DIAGNOSIS — Z7901 Long term (current) use of anticoagulants: Secondary | ICD-10-CM

## 2013-02-18 NOTE — Assessment & Plan Note (Signed)
Rhinitis with postnasal drip. Plan-continue Zyrtec

## 2013-02-18 NOTE — Assessment & Plan Note (Signed)
Plan-chest x-ray now. Schedule followup CT chest in November

## 2013-02-22 ENCOUNTER — Ambulatory Visit (INDEPENDENT_AMBULATORY_CARE_PROVIDER_SITE_OTHER): Payer: Medicare Other

## 2013-02-22 DIAGNOSIS — J45909 Unspecified asthma, uncomplicated: Secondary | ICD-10-CM

## 2013-02-23 MED ORDER — OMALIZUMAB 150 MG ~~LOC~~ SOLR
300.0000 mg | Freq: Once | SUBCUTANEOUS | Status: AC
  Administered 2013-02-23: 300 mg via SUBCUTANEOUS

## 2013-03-03 ENCOUNTER — Ambulatory Visit (INDEPENDENT_AMBULATORY_CARE_PROVIDER_SITE_OTHER): Payer: Medicare Other | Admitting: *Deleted

## 2013-03-03 DIAGNOSIS — Z7901 Long term (current) use of anticoagulants: Secondary | ICD-10-CM

## 2013-03-03 DIAGNOSIS — I4891 Unspecified atrial fibrillation: Secondary | ICD-10-CM

## 2013-03-03 LAB — POCT INR: INR: 1.7

## 2013-03-16 ENCOUNTER — Ambulatory Visit (INDEPENDENT_AMBULATORY_CARE_PROVIDER_SITE_OTHER): Payer: Medicare Other | Admitting: *Deleted

## 2013-03-16 DIAGNOSIS — I4891 Unspecified atrial fibrillation: Secondary | ICD-10-CM

## 2013-03-16 DIAGNOSIS — Z7901 Long term (current) use of anticoagulants: Secondary | ICD-10-CM

## 2013-03-16 LAB — POCT INR: INR: 2.4

## 2013-03-23 ENCOUNTER — Ambulatory Visit: Payer: Medicare Other

## 2013-03-24 ENCOUNTER — Ambulatory Visit (INDEPENDENT_AMBULATORY_CARE_PROVIDER_SITE_OTHER): Payer: Medicare Other

## 2013-03-24 DIAGNOSIS — J45909 Unspecified asthma, uncomplicated: Secondary | ICD-10-CM

## 2013-03-31 MED ORDER — OMALIZUMAB 150 MG ~~LOC~~ SOLR
300.0000 mg | Freq: Once | SUBCUTANEOUS | Status: AC
  Administered 2013-03-31: 300 mg via SUBCUTANEOUS

## 2013-04-13 ENCOUNTER — Other Ambulatory Visit: Payer: Self-pay

## 2013-04-13 ENCOUNTER — Ambulatory Visit (INDEPENDENT_AMBULATORY_CARE_PROVIDER_SITE_OTHER): Payer: Medicare Other | Admitting: *Deleted

## 2013-04-13 DIAGNOSIS — Z7901 Long term (current) use of anticoagulants: Secondary | ICD-10-CM

## 2013-04-13 DIAGNOSIS — I4891 Unspecified atrial fibrillation: Secondary | ICD-10-CM

## 2013-04-20 ENCOUNTER — Encounter: Payer: Self-pay | Admitting: Orthopedic Surgery

## 2013-04-20 ENCOUNTER — Ambulatory Visit (INDEPENDENT_AMBULATORY_CARE_PROVIDER_SITE_OTHER): Payer: Medicare Other

## 2013-04-20 ENCOUNTER — Ambulatory Visit (INDEPENDENT_AMBULATORY_CARE_PROVIDER_SITE_OTHER): Payer: Medicare Other | Admitting: Orthopedic Surgery

## 2013-04-20 VITALS — BP 122/82 | Ht 66.0 in | Wt 158.0 lb

## 2013-04-20 DIAGNOSIS — M549 Dorsalgia, unspecified: Secondary | ICD-10-CM

## 2013-04-20 DIAGNOSIS — M47817 Spondylosis without myelopathy or radiculopathy, lumbosacral region: Secondary | ICD-10-CM

## 2013-04-20 DIAGNOSIS — M47816 Spondylosis without myelopathy or radiculopathy, lumbar region: Secondary | ICD-10-CM | POA: Insufficient documentation

## 2013-04-20 MED ORDER — TRAMADOL-ACETAMINOPHEN 37.5-325 MG PO TABS: 1.0000 | ORAL_TABLET | ORAL | Status: DC | PRN

## 2013-04-20 MED ORDER — PREDNISONE (PAK) 5 MG PO TABS: 5.0000 mg | ORAL_TABLET | ORAL | Status: DC

## 2013-04-20 NOTE — Patient Instructions (Addendum)
Take medications as prescribed

## 2013-04-20 NOTE — Progress Notes (Deleted)
  Subjective:    [name] is a 79 y.o. female who presents for evaluation of low back pain. The patient has had {history; pain back:5285::"recurrent self limited episodes of low back pain in the past"}. Symptoms have been present for {1-10:13787} {units:19031} and are {clinical course - history:17::"unchanged"}.  Onset was related to / precipitated by {causes; back pain:32249::"no known injury"}. The pain is located in the {back pain location:31199} and {radiation:20410}. The pain is described as {pain quality:31200} and occurs {timing:31009}. {Pain rating:20411} Symptoms are exacerbated by {causes; aggravators pain back:31424}. Symptoms are improved by {pain treatments:32172}. She has also tried {pain treatments:32172} which provided no symptom relief. She has {back pain associated symptoms neuro:31426} associated with the back pain. {red flag Hx:20412}  {Common ambulatory SmartLinks:19316}  Review of Systems {ros - complete:30496}    Objective:   {Exam; back exam:5796::"Full range of motion without pain, no tenderness, no spasm, no curvature.","Normal reflexes, gait, strength and negative straight-leg raise."}    Assessment:    {back diagnosis:16452}    Plan:    {Plan; back pain:10213}

## 2013-04-20 NOTE — Progress Notes (Signed)
Patient ID: [name], female   DOB: [birth year], 79 y.o.   MRN: [id number] Chief Complaint  Patient presents with  . Leg Pain    Right leg, hip and back pain that radiates down the leg    History this is a 79 year old female with a 1 month history of right leg hip and back pain with gradual onset no history of trauma. She has 8/10 dull throbbing intermittent pain which is worse when she's lying down and improved by walking she denies numbness tingling locking catching or red flags  She is a recorded review of systems of fatigue wheezing cough tightness in the chest joint pain instability stiffness muscle pain nervousness anxiety depression Coumadin therapy which causes easy bleeding and bruising seasonal allergies all other systems were reviewed were normal her past family and social history are recorded and affect  She is well-developed well-nourished female grooming and hygiene are intact her vital signs are stable BP 122/82  Ht 5\' 6"  (1.676 m)  Wt 158 lb (71.668 kg)  BMI 25.51 kg/m2 She is ambulatory with no assistive devices and does not have any issues with gait  She is tender over the right lower lumbar spine but does not exhibit increased muscle tension in the skin changes or significant measurable range of motion deficits  Shows normal range of motion in her hips normal alignment of her legs leg lengths are equal both hips are stable muscle strength is normal she has good distal pulses bilaterally and she has equal reflexes and normal sensation. Straight leg raise causes discomfort but no radicular symptoms  X-ray show facet joint arthritis and normal hip x-rays  Diagnosis lumbar facet arthritis  Recommend a 5 mg 12 day dose pack and Ultracet for pain  Return in 6 weeks to check on her progress

## 2013-04-25 ENCOUNTER — Ambulatory Visit (INDEPENDENT_AMBULATORY_CARE_PROVIDER_SITE_OTHER): Payer: Medicare Other

## 2013-04-25 ENCOUNTER — Ambulatory Visit: Payer: Medicare Other

## 2013-04-25 DIAGNOSIS — J45909 Unspecified asthma, uncomplicated: Secondary | ICD-10-CM

## 2013-05-03 MED ORDER — OMALIZUMAB 150 MG ~~LOC~~ SOLR
300.0000 mg | Freq: Once | SUBCUTANEOUS | Status: AC
  Administered 2013-05-03: 300 mg via SUBCUTANEOUS

## 2013-05-10 ENCOUNTER — Encounter: Payer: Self-pay | Admitting: Cardiology

## 2013-05-10 ENCOUNTER — Encounter: Payer: Medicare Other | Admitting: Cardiology

## 2013-05-10 NOTE — Progress Notes (Signed)
No show  This encounter was created in error - please disregard.

## 2013-05-11 ENCOUNTER — Ambulatory Visit (INDEPENDENT_AMBULATORY_CARE_PROVIDER_SITE_OTHER): Payer: Medicare Other | Admitting: *Deleted

## 2013-05-11 DIAGNOSIS — I4891 Unspecified atrial fibrillation: Secondary | ICD-10-CM

## 2013-05-11 DIAGNOSIS — Z7901 Long term (current) use of anticoagulants: Secondary | ICD-10-CM

## 2013-05-13 ENCOUNTER — Telehealth: Payer: Self-pay | Admitting: *Deleted

## 2013-05-13 ENCOUNTER — Other Ambulatory Visit: Payer: Self-pay | Admitting: Cardiology

## 2013-05-13 NOTE — Telephone Encounter (Signed)
error 

## 2013-05-16 ENCOUNTER — Other Ambulatory Visit: Payer: Self-pay | Admitting: *Deleted

## 2013-05-16 DIAGNOSIS — I059 Rheumatic mitral valve disease, unspecified: Secondary | ICD-10-CM

## 2013-05-23 ENCOUNTER — Ambulatory Visit (HOSPITAL_COMMUNITY)
Admission: RE | Admit: 2013-05-23 | Discharge: 2013-05-23 | Disposition: A | Discharge status: Home / Self Care | Payer: Medicare Other | Source: Ambulatory Visit | Attending: Cardiology | Admitting: Cardiology

## 2013-05-23 ENCOUNTER — Ambulatory Visit (INDEPENDENT_AMBULATORY_CARE_PROVIDER_SITE_OTHER): Payer: Medicare Other

## 2013-05-23 DIAGNOSIS — J45909 Unspecified asthma, uncomplicated: Secondary | ICD-10-CM

## 2013-05-23 DIAGNOSIS — I059 Rheumatic mitral valve disease, unspecified: Secondary | ICD-10-CM

## 2013-05-24 MED ORDER — OMALIZUMAB 150 MG ~~LOC~~ SOLR
300.0000 mg | Freq: Once | SUBCUTANEOUS | Status: AC
  Administered 2013-05-25: 300 mg via SUBCUTANEOUS

## 2013-05-27 ENCOUNTER — Ambulatory Visit: Payer: Medicare Other | Admitting: Cardiology

## 2013-06-02 ENCOUNTER — Ambulatory Visit (INDEPENDENT_AMBULATORY_CARE_PROVIDER_SITE_OTHER): Payer: Medicare Other | Admitting: Orthopedic Surgery

## 2013-06-02 ENCOUNTER — Encounter: Payer: Self-pay | Admitting: Orthopedic Surgery

## 2013-06-02 ENCOUNTER — Other Ambulatory Visit: Payer: Self-pay | Admitting: *Deleted

## 2013-06-02 VITALS — BP 114/60 | Ht 66.0 in | Wt 158.0 lb

## 2013-06-02 DIAGNOSIS — M47816 Spondylosis without myelopathy or radiculopathy, lumbar region: Secondary | ICD-10-CM

## 2013-06-02 DIAGNOSIS — M47817 Spondylosis without myelopathy or radiculopathy, lumbosacral region: Secondary | ICD-10-CM

## 2013-06-02 MED ORDER — DILTIAZEM HCL ER COATED BEADS 240 MG PO CP24: 240.0000 mg | ORAL_CAPSULE | Freq: Every day | ORAL | Status: DC

## 2013-06-02 NOTE — Telephone Encounter (Signed)
refill 

## 2013-06-02 NOTE — Patient Instructions (Signed)
Stay on ultracet refill as needed

## 2013-06-02 NOTE — Progress Notes (Signed)
Patient ID: [name], female   DOB: 08-05-1934, 79 y.o.   MRN: [id number]  Chief Complaint  Patient presents with  . Follow-up    6 week recheck back    Reevaluate status post prednisone Dosepak 12 days and Ultracet for pain for chronic back arthritis  Patient says she's walking better her pain is decreased she's not really symptomatic at this time  Recommend continue current medication Ultracet followup with [place] in about 3 months to check on her progress

## 2013-06-08 ENCOUNTER — Ambulatory Visit (INDEPENDENT_AMBULATORY_CARE_PROVIDER_SITE_OTHER): Payer: Medicare Other | Admitting: *Deleted

## 2013-06-08 DIAGNOSIS — I4891 Unspecified atrial fibrillation: Secondary | ICD-10-CM

## 2013-06-08 DIAGNOSIS — Z7901 Long term (current) use of anticoagulants: Secondary | ICD-10-CM

## 2013-06-10 ENCOUNTER — Ambulatory Visit (INDEPENDENT_AMBULATORY_CARE_PROVIDER_SITE_OTHER): Payer: Medicare Other | Admitting: Cardiology

## 2013-06-10 ENCOUNTER — Encounter: Payer: Self-pay | Admitting: Cardiology

## 2013-06-10 VITALS — BP 133/73 | HR 76 | Ht 66.0 in | Wt 165.2 lb

## 2013-06-10 DIAGNOSIS — I059 Rheumatic mitral valve disease, unspecified: Secondary | ICD-10-CM

## 2013-06-10 DIAGNOSIS — J449 Chronic obstructive pulmonary disease, unspecified: Secondary | ICD-10-CM

## 2013-06-10 DIAGNOSIS — I429 Cardiomyopathy, unspecified: Secondary | ICD-10-CM

## 2013-06-10 DIAGNOSIS — I4891 Unspecified atrial fibrillation: Secondary | ICD-10-CM

## 2013-06-10 NOTE — Assessment & Plan Note (Signed)
Recent bronchitis flare.

## 2013-06-10 NOTE — Assessment & Plan Note (Signed)
Permanent, continue strategy of heart rate control and anticoagulation.

## 2013-06-10 NOTE — Patient Instructions (Addendum)
Your physician recommends that you schedule a follow-up appointment in: 6 months with Dr Ferne Reus will receive a reminder letter two months in advance reminding you to call and schedule your appointment. If you don't receive this letter, please contact our office.,  Your physician recommends that you continue on your current medications as directed. Please refer to the Current Medication list given to you today.

## 2013-06-10 NOTE — Progress Notes (Signed)
Clinical Summary Ms. Gollihar is a 79 y.o.female last seen in March. She reports no palpitations or chest pain symptoms. Breathing status has been stable with the exception of recent flare of bronchitis, for which she is taking a course of steroids and antibiotics.  Followup echocardiogram in September of this year showed mild LVH with LVEF 55-60%, indeterminate diastolic function, mitral prolapse with moderate posteriorly directed mitral regurgitation, moderate to severe left atrial enlargement, mild RV and RA enlargement. We reviewed these results today.  Allergies  Allergen Reactions  . Amoxicillin-Pot Clavulanate   . Clarithromycin   . Clindamycin   . Doxycycline   . Penicillins     Current Outpatient Prescriptions  Medication Sig Dispense Refill  . albuterol (PROAIR HFA) 108 (90 BASE) MCG/ACT inhaler INHALE 1-2 PUFFS EVERY 4 TO 6 HOURS AS NEEDED FOR WHEEZING  8.5 each  1  . albuterol (PROVENTIL) (2.5 MG/3ML) 0.083% nebulizer solution Take 3 mLs (2.5 mg total) by nebulization every 4 (four) hours as needed for wheezing or shortness of breath.  120 mL  12  . atorvastatin (LIPITOR) 10 MG tablet Take 10 mg by mouth daily.        Marland Kitchen azithromycin (ZITHROMAX) 250 MG tablet Take 250 mg by mouth daily.      . Calcium Carbonate-Vitamin D (CALTRATE 600+D) 600-400 MG-UNIT per tablet Take 1 tablet by mouth daily.        . cetirizine (ZYRTEC) 10 MG tablet Take 10 mg by mouth daily.      . citalopram (CELEXA) 20 MG tablet Take 20 mg by mouth daily.       . diazepam (VALIUM) 2 MG tablet Take 1 tablet (2 mg total) by mouth every 8 (eight) hours as needed for anxiety.  30 tablet  5  . DIGOX 125 MCG tablet TAKE 1 TABLET (125 MCG TOTAL) BY MOUTH DAILY.  90 tablet  0  . diltiazem (CARDIZEM CD) 240 MG 24 hr capsule Take 1 capsule (240 mg total) by mouth daily.  90 capsule  1  . EPINEPHrine (EPI-PEN) 0.3 MG/0.3ML DEVI Inject 0.3 mg into the muscle as needed.        Marland Kitchen esomeprazole (NEXIUM) 40 MG capsule  Take 40 mg by mouth 2 (two) times daily.        . furosemide (LASIX) 40 MG tablet TAKE 1 TABLET (40 MG TOTAL) BY MOUTH DAILY.  30 tablet  10  . mometasone (NASONEX) 50 MCG/ACT nasal spray Place 2 sprays into the nose as needed.       Marland Kitchen omalizumab (XOLAIR) 150 MG injection 300 mg once every 28 days  900 mg  3  . potassium chloride SA (KLOR-CON M20) 20 MEQ tablet Take 1 tablet (20 mEq total) by mouth daily.  30 tablet  6  . predniSONE (DELTASONE) 10 MG tablet Take 10 mg by mouth daily.      . traMADol-acetaminophen (ULTRACET) 37.5-325 MG per tablet Take 1 tablet by mouth every 4 (four) hours as needed for pain.  90 tablet  5  . warfarin (COUMADIN) 5 MG tablet TAKE 1 TABLET DAILY OR AS DIRECTED  45 tablet  2   No current facility-administered medications for this visit.    Past Medical History  Diagnosis Date  . Asthma   . Atrial fibrillation   . COPD (chronic obstructive pulmonary disease)   . GERD (gastroesophageal reflux disease)   . Mitral valve prolapse   . Nonischemic cardiomyopathy     LVEF 25-30% improved  to 45-50%  . Chronic rhinitis   . Obstructive sleep apnea   . Transudative pleural effusion   . Nasal polyps   . Mitral regurgitation     Mild to moderate 5/12    Social History Ms. Kraft reports that she quit smoking about 32 years ago. Her smoking use included Cigarettes. She has a 10 pack-year smoking history. She has never used smokeless tobacco. Ms. Ozer reports that she does not drink alcohol.  Review of Systems Arthritic pain. Otherwise negative except as outlined.  Physical Examination Filed Vitals:   06/10/13 0949  BP: 133/73  Pulse: 76   Filed Weights   06/10/13 0949  Weight: 165 lb 4 oz (74.957 kg)    Thin elderly woman in no acute distress.  HEENT: Lids normal, conjunctiva normal, oropharynx clear with moist mucosa.  Neck: Supple, no elevated JVP or carotid bruits, no thyromegaly.  Lungs: Coarse breath sounds with end expiratory wheeze and  rhonchi, no labored breathing.  Cardiac: Irregularly irregular, soft apical systolic murmur, no pericardial rub.  Abdomen: Soft, nontender, bowel sound present.  Skin: Warm and dry.  Extremities: No pitting edema below the knees.  Musculoskeletal: No kyphosis.  Neuropsychiatric: Alert and oriented x3, affect appropriate.   Problem List and Plan   Atrial fibrillation Permanent, continue strategy of heart rate control and anticoagulation.  MITRAL VALVE PROLAPSE Moderate bileaflet prolapse, anterior greater than posterior, associated with stable moderate mitral regurgitation.  UNSPECIFIED SECONDARY CARDIOMYOPATHY Nonischemic with normalization of LVEF on medical therapy.  Asthma with COPD (chronic obstructive pulmonary disease) Recent bronchitis flare.    Satira Sark, M.D., F.A.C.C.

## 2013-06-10 NOTE — Assessment & Plan Note (Signed)
Nonischemic with normalization of LVEF on medical therapy.

## 2013-06-10 NOTE — Assessment & Plan Note (Signed)
Moderate bileaflet prolapse, anterior greater than posterior, associated with stable moderate mitral regurgitation.

## 2013-06-15 ENCOUNTER — Other Ambulatory Visit (HOSPITAL_COMMUNITY): Payer: Self-pay | Admitting: "Endocrinology

## 2013-06-15 DIAGNOSIS — E049 Nontoxic goiter, unspecified: Secondary | ICD-10-CM

## 2013-06-16 ENCOUNTER — Ambulatory Visit (INDEPENDENT_AMBULATORY_CARE_PROVIDER_SITE_OTHER): Payer: Medicare Other | Admitting: *Deleted

## 2013-06-16 DIAGNOSIS — Z7901 Long term (current) use of anticoagulants: Secondary | ICD-10-CM

## 2013-06-16 DIAGNOSIS — I4891 Unspecified atrial fibrillation: Secondary | ICD-10-CM

## 2013-06-22 ENCOUNTER — Ambulatory Visit (INDEPENDENT_AMBULATORY_CARE_PROVIDER_SITE_OTHER): Payer: Medicare Other

## 2013-06-22 DIAGNOSIS — J449 Chronic obstructive pulmonary disease, unspecified: Secondary | ICD-10-CM

## 2013-06-22 MED ORDER — OMALIZUMAB 150 MG ~~LOC~~ SOLR
300.0000 mg | Freq: Once | SUBCUTANEOUS | Status: AC
  Administered 2013-06-22: 300 mg via SUBCUTANEOUS

## 2013-06-23 ENCOUNTER — Telehealth: Payer: Self-pay | Admitting: *Deleted

## 2013-06-23 ENCOUNTER — Other Ambulatory Visit: Payer: Self-pay | Admitting: Orthopedic Surgery

## 2013-06-23 DIAGNOSIS — M549 Dorsalgia, unspecified: Secondary | ICD-10-CM

## 2013-06-23 DIAGNOSIS — M47816 Spondylosis without myelopathy or radiculopathy, lumbar region: Secondary | ICD-10-CM

## 2013-06-23 MED ORDER — TRAMADOL-ACETAMINOPHEN 37.5-325 MG PO TABS: 1.0000 | ORAL_TABLET | ORAL | Status: DC | PRN

## 2013-06-23 NOTE — Telephone Encounter (Signed)
Patient called requesting refill on Tramadol 37.5/325. She is aware that she will need to pick up prescription if you ok the refill.  Patient's # [phone] Please advise.

## 2013-06-23 NOTE — Telephone Encounter (Signed)
This can be called in

## 2013-06-23 NOTE — Telephone Encounter (Signed)
Prescription for Tramadol was faxed to [facility].

## 2013-06-28 ENCOUNTER — Other Ambulatory Visit: Payer: Self-pay | Admitting: Internal Medicine

## 2013-06-30 ENCOUNTER — Ambulatory Visit (INDEPENDENT_AMBULATORY_CARE_PROVIDER_SITE_OTHER): Payer: Medicare Other | Admitting: *Deleted

## 2013-06-30 DIAGNOSIS — I4891 Unspecified atrial fibrillation: Secondary | ICD-10-CM

## 2013-06-30 DIAGNOSIS — Z7901 Long term (current) use of anticoagulants: Secondary | ICD-10-CM

## 2013-07-14 ENCOUNTER — Ambulatory Visit (INDEPENDENT_AMBULATORY_CARE_PROVIDER_SITE_OTHER)
Admission: RE | Admit: 2013-07-14 | Discharge: 2013-07-14 | Disposition: A | Discharge status: Home / Self Care | Payer: Medicare Other | Source: Ambulatory Visit | Attending: Internal Medicine | Admitting: Internal Medicine

## 2013-07-14 DIAGNOSIS — R918 Other nonspecific abnormal finding of lung field: Secondary | ICD-10-CM

## 2013-07-15 NOTE — Progress Notes (Signed)
Quick Note:  Pt aware of results. ______ 

## 2013-07-16 ENCOUNTER — Other Ambulatory Visit: Payer: Self-pay | Admitting: Cardiology

## 2013-07-21 ENCOUNTER — Ambulatory Visit (INDEPENDENT_AMBULATORY_CARE_PROVIDER_SITE_OTHER): Payer: Medicare Other | Admitting: *Deleted

## 2013-07-21 DIAGNOSIS — Z7901 Long term (current) use of anticoagulants: Secondary | ICD-10-CM

## 2013-07-21 DIAGNOSIS — I4891 Unspecified atrial fibrillation: Secondary | ICD-10-CM

## 2013-07-21 LAB — POCT INR: INR: 3.6

## 2013-07-25 ENCOUNTER — Ambulatory Visit: Payer: Medicare Other

## 2013-07-25 ENCOUNTER — Telehealth: Payer: Self-pay | Admitting: Internal Medicine

## 2013-07-25 NOTE — Telephone Encounter (Signed)
Ok to skip this time, then resume xolair as regularly scheduled next time.

## 2013-07-25 NOTE — Telephone Encounter (Signed)
I spoke with pt and she reports she has shingles and missed xolair Monday. Wanting to know if she should get xolair or how long she should wait. Please advise Dr. Annamaria Boots thanks

## 2013-07-25 NOTE — Telephone Encounter (Signed)
Pt aware and needed nothing further

## 2013-08-01 ENCOUNTER — Other Ambulatory Visit: Payer: Self-pay | Admitting: *Deleted

## 2013-08-01 DIAGNOSIS — M47816 Spondylosis without myelopathy or radiculopathy, lumbar region: Secondary | ICD-10-CM

## 2013-08-01 DIAGNOSIS — M549 Dorsalgia, unspecified: Secondary | ICD-10-CM

## 2013-08-01 MED ORDER — TRAMADOL-ACETAMINOPHEN 37.5-325 MG PO TABS: 1.0000 | ORAL_TABLET | ORAL | Status: DC | PRN

## 2013-08-10 ENCOUNTER — Ambulatory Visit (INDEPENDENT_AMBULATORY_CARE_PROVIDER_SITE_OTHER): Payer: Medicare Other | Admitting: *Deleted

## 2013-08-10 DIAGNOSIS — Z7901 Long term (current) use of anticoagulants: Secondary | ICD-10-CM

## 2013-08-10 DIAGNOSIS — I4891 Unspecified atrial fibrillation: Secondary | ICD-10-CM

## 2013-08-10 LAB — POCT INR: INR: 5.4

## 2013-08-11 ENCOUNTER — Encounter: Payer: Self-pay | Admitting: Internal Medicine

## 2013-08-11 ENCOUNTER — Ambulatory Visit (INDEPENDENT_AMBULATORY_CARE_PROVIDER_SITE_OTHER): Payer: Medicare Other | Admitting: Internal Medicine

## 2013-08-11 VITALS — BP 116/76 | HR 72 | Ht 66.0 in | Wt 167.0 lb

## 2013-08-11 DIAGNOSIS — J449 Chronic obstructive pulmonary disease, unspecified: Secondary | ICD-10-CM

## 2013-08-11 DIAGNOSIS — B0229 Other postherpetic nervous system involvement: Secondary | ICD-10-CM

## 2013-08-11 DIAGNOSIS — R918 Other nonspecific abnormal finding of lung field: Secondary | ICD-10-CM

## 2013-08-11 DIAGNOSIS — I4891 Unspecified atrial fibrillation: Secondary | ICD-10-CM

## 2013-08-11 NOTE — Patient Instructions (Signed)
Please call as needed  We will want to get you the new pneumonia vaccine later, after the shingles has resolved  We can continue Xolair

## 2013-08-11 NOTE — Progress Notes (Signed)
Patient ID: [name], female    DOB: Sep 14, 1933, 79 y.o.   MRN: [id number]  HPI 7/113/12-  76 yoF former smoker, followed for Chronic asthma/ COPD, chronic rhinosinusitis, complicated by hx AFib/ anticoagulation, CHF, cardiomyopathy, mitral insufficiency, GERD Last here December 11/19/10- note reviewed She has felt well controlled with no acute breathing issues. She saw Dr Domenic Polite for cardiac f/u 2 weeks ago and Echo was good with no med changes. Uses rescue inhaler only occasionally, usually on first rising. Little wheeze at times, much better. Little DOE with ADLs PFT 12/06/10= chronic obstructive asthma   7/113/12-  76 yoF former smoker, followed for Chronic asthma/ COPD, chronic rhinosinusitis, complicated by hx AFib/ anticoagulation, CHF, cardiomyopathy, mitral insufficiency, GERD Has had flu vaccine. Persistent head and chest congestion for the past 2 weeks. On November 7 we sent  Avelox for 5 days. This helped but didn't last long enough. She took a leftover prednisone taper but wants to avoid more prednisone now if possible. She has noted mild tremor in her hands for the past month no lateralizing finding. She continues Xolair injections. Using nebulizer or rescue inhaler only every 1 or 2 days. Has lost some weight and admits she has been dieting some but otherwise she's not sure that explains it. Thyroid function test normal in the past.  11/27/11- 76 yoF former smoker, followed for Chronic asthma/ COPD, chronic rhinosinusitis, complicated by hx AFib/ anticoagulation, CHF, cardiomyopathy, mitral insufficiency, GERD, Lung nodules Continues to do well. Continues Xolair. She thyroid nodule biopsy by interventional radiology. Waiting for results. Eyes have been watering some increased nasal drainage. We discussed possibility of seasonal allergic symptoms when she is getting Xolair. CT chest 10/20/2011-bilateral nodules less than 10 mm. Needs followup CT scan in May.  02/05/12- 76 yoF  former smoker, followed for Chronic asthma/ COPD, chronic rhinosinusitis, complicated by hx AFib/ anticoagulation, CHF, cardiomyopathy, mitral insufficiency, GERD, Lung nodules Denies any cough, congestion, SOB, or wheezing, Slight flare up with allergies at this time and still on Xolair. CAT score 8 She is pleased to be holding her current weight. Sometimes has a little wheeze but not much rarely needs a rescue inhaler and uses her nebulizer 2 or 3 times per week. Continues Xolair and believes it helps. Denies reflux or heartburn. CT chest  01/29/12- images reviewed with her. IMPRESSION:  1. Scattered bilateral pulmonary nodules are unchanged in size  from 10/22/2011. Additional follow-up CT chest without contrast  could be performed in 6 months to ensure continued stability, as  clinically indicated. This recommendation follows the consensus  statement: Guidelines for Management of Small Pulmonary Nodules  Detected on CT Scans: A Statement from the [facility] as  published in Radiology 2005; 237:395-400. If multidisciplinary  follow up management is desired, this is available in the [place] through the [facility] [phone].  2. Dominant right thyroid nodule. Consider further evaluation with  thyroid ultrasound. If patient is clinically hyperthyroid,  consider nuclear medicine thyroid uptake and scan.  Original Report Authenticated By: Luretha Rued, M.D.    08/09/12- 66 yoF former smoker, followed for Chronic asthma/ COPD, chronic rhinosinusitis, complicated by hx AFib/ anticoagulation, CHF, cardiomyopathy, mitral insufficiency, GERD, Lung nodules FOLLOWS FOR: denies any SOB, wheezing or cough. States she is having chest/throat congestion. For 2 weeks has had "congestion" in throat and chest. Dr. Hilma Favors treated Levaquin and it one week ago. She denies purulent discharge, fever or wheeze. Echocardiogram 05/03/2012-further improved. EF  60-65% with mitral regurgitation and mitral valve prolapse. Chest refill Valium use occasionally. CT 08/04/12-reviewed with her IMPRESSION:  1. Stable bilateral pulmonary nodules. No new pulmonary nodules  are noted. A follow-up examination in 1 year is recommended to  assure stability.  2. Stable 2 cm low density nodule in the right lobe of the thyroid  gland.  3. No acute infiltrate or pulmonary edema. Small hiatal hernia  again noted.  Original Report Authenticated By: Lahoma Crocker, M.D.  02/07/13- 66 yoF former smoker, followed for Chronic asthma/ COPD, chronic rhinosinusitis, complicated by hx AFib/ anticoagulation, CHF, cardiomyopathy, mitral insufficiency, GERD, Lung nodules FOLLOWS FOR: still on Xolair; drainage in throat. Denies any flare ups with asthma or allergies at this time. Taking Zyrtec. Some postnasal drip. Had thyroid nodule biopsied.  08/11/13- 79 yoF former smoker, followed for Chronic asthma/ COPD, chronic rhinosinusitis, complicated by hx AFib/ anticoagulation, CHF, cardiomyopathy, mitral insufficiency, GERD, Lung nodules FOLLOWS FOR: has had shingles for aobut 4 weeks; still on Xolair and plans to get injection for December as she did not take November's injection. Shingles right lateral rib cage- valacyclovir Rx'd. Still hurts. It is not needing oxygen. CT 07/15/13  IMPRESSION:  1. No acute cardiopulmonary abnormalities.  2. Stable nodules from 10/22/2011. These are unchanged for almost 19  months and are likely benign.  3. Atherosclerotic disease.  4. Hiatal hernia status post fundoplication.  Electronically Signed  By: Kerby Moors M.D.  On: 07/14/2013 13:04   Review of Systems-see HPI Constitutional:    weight loss, night sweats, fevers, chills, fatigue, lassitude. HEENT:   No-  headaches, difficulty swallowing, tooth/dental problems, sore throat,       No-  sneezing, itching, ear ache, +less nasal congestion, +post nasal drip,  CV:  +postherpetic R chest  pain, no-her her aorthopnea, PND, swelling in lower extremities, anasarca, dizziness, palpitations Resp: No- acute  shortness of breath with exertion or at rest.              No-   productive cough,  No- non-productive cough,  No- coughing up of blood.              No-   change in color of mucus.  Little wheezing.   Skin: No-   rash or lesions. GI:  No-   heartburn, indigestion, abdominal pain, nausea, vomiting,  GU: . MS:  No-   joint pain or swelling.  Neuro-     nothing unusual Psych:  No- change in mood or affect. No depression or anxiety.  No memory loss.  Objective:   Physical Exam General- Alert, Oriented, Affect-appropriate/ cheerful, Distress- none acute, thin Skin- no rash visible now on right lateral ribs Lymphadenopathy- none Head- atraumatic            Eyes- Gross vision intact, PERRLA, conjunctivae clear secretions            Ears- Hearing, canals-normal            Nose- turbinate edema, no-Septal dev, mucus, polyps, erosion, perforation             Throat- Mallampati II , mucosa clear/ not red , drainage- none, tonsils- atrophic,                          dentures.+ Mild hoarseness .+ Large torus hard palate Neck- flexible , trachea midline, no stridor , thyroid nl, carotid no bruit Chest - symmetrical excursion , unlabored  Heart/CV- +IRR/ AFib well controlled , + faint ? Mitral regurg murmur , no gallop  , no rub, nl s1 s2                           - JVD- none , edema- none, stasis changes- none, varices- none           Lung- clear, wheeze- none, cough- none , dullness-none, rub- none           Chest wall-  Abd-  Br/ Gen/ Rectal- Not done, not indicated Extrem- cyanosis- none, clubbing, none, atrophy- none, strength- nl Neuro- slight resting tremor

## 2013-08-15 ENCOUNTER — Ambulatory Visit: Payer: Medicare Other

## 2013-08-18 ENCOUNTER — Telehealth: Payer: Self-pay | Admitting: Internal Medicine

## 2013-08-18 NOTE — Telephone Encounter (Signed)
Suggest Mucinex DM. Let us know if there is fever/ green/ infection

## 2013-08-18 NOTE — Telephone Encounter (Signed)
Pt's niece is aware of CY recs. Nothing further was needed.

## 2013-08-18 NOTE — Telephone Encounter (Signed)
Spoke with pt's niece. States that the pt has a had a cough and chest congestion x2-3 days. No OTC medications have been taken for sx. Denies fever/chills. Would like something called in.  Allergies  Allergen Reactions  . Amoxicillin-Pot Clavulanate   . Clarithromycin   . Clindamycin   . Doxycycline   . Penicillins     Current Outpatient Prescriptions on File Prior to Visit  Medication Sig Dispense Refill  . albuterol (PROVENTIL) (2.5 MG/3ML) 0.083% nebulizer solution Take 3 mLs (2.5 mg total) by nebulization every 4 (four) hours as needed for wheezing or shortness of breath.  120 mL  12  . atorvastatin (LIPITOR) 10 MG tablet Take 10 mg by mouth daily.        Marland Kitchen azithromycin (ZITHROMAX) 250 MG tablet Take 250 mg by mouth daily.      . Calcium Carbonate-Vitamin D (CALTRATE 600+D) 600-400 MG-UNIT per tablet Take 1 tablet by mouth daily.        . cetirizine (ZYRTEC) 10 MG tablet Take 10 mg by mouth daily.      . citalopram (CELEXA) 20 MG tablet Take 20 mg by mouth daily.       Marland Kitchen DIGOX 125 MCG tablet TAKE 1 TABLET (125 MCG TOTAL) BY MOUTH DAILY.  90 tablet  0  . diltiazem (CARDIZEM CD) 240 MG 24 hr capsule Take 1 capsule (240 mg total) by mouth daily.  90 capsule  1  . EPINEPHrine (EPI-PEN) 0.3 MG/0.3ML DEVI Inject 0.3 mg into the muscle as needed.        Marland Kitchen esomeprazole (NEXIUM) 40 MG capsule Take 40 mg by mouth 2 (two) times daily.        . furosemide (LASIX) 40 MG tablet TAKE 1 TABLET (40 MG TOTAL) BY MOUTH DAILY.  30 tablet  10  . mometasone (NASONEX) 50 MCG/ACT nasal spray Place 2 sprays into the nose as needed.       Marland Kitchen omalizumab (XOLAIR) 150 MG injection 300 mg once every 28 days  900 mg  3  . potassium chloride SA (KLOR-CON M20) 20 MEQ tablet Take 1 tablet (20 mEq total) by mouth daily.  30 tablet  6  . predniSONE (DELTASONE) 10 MG tablet Take 10 mg by mouth daily.      Marland Kitchen PROAIR HFA 108 (90 BASE) MCG/ACT inhaler INHALE 1-2 PUFFS EVERY 4 TO 6 HOURS AS NEEDED FOR WHEEZING  8.5 each  1   . traMADol-acetaminophen (ULTRACET) 37.5-325 MG per tablet Take 1 tablet by mouth every 4 (four) hours as needed.  90 tablet  5  . warfarin (COUMADIN) 5 MG tablet TAKE 1 TABLET DAILY OR AS DIRECTED  45 tablet  2   No current facility-administered medications on file prior to visit.    CY - please advise. Thanks.

## 2013-08-19 ENCOUNTER — Telehealth: Payer: Self-pay | Admitting: Internal Medicine

## 2013-08-19 ENCOUNTER — Ambulatory Visit: Payer: Medicare Other

## 2013-08-19 NOTE — Telephone Encounter (Signed)
I called and spoke with [name]. She has missed her last couple appts for xolair d/t having shingles. She still has the scabs and they still hurt. She wants to know when she would be able to get her xolair again. Please advise Dr. Annamaria Boots thanks

## 2013-08-19 NOTE — Telephone Encounter (Signed)
Pt is aware and will get this scheduled. Nothing further needed

## 2013-08-19 NOTE — Telephone Encounter (Signed)
Per CY-okay to go ahead and start back taking Xolair injections . Thanks

## 2013-08-22 ENCOUNTER — Other Ambulatory Visit: Payer: Self-pay

## 2013-08-22 ENCOUNTER — Telehealth: Payer: Self-pay | Admitting: Cardiology

## 2013-08-22 MED ORDER — DIGOXIN 125 MCG PO TABS: 0.1250 mg | ORAL_TABLET | Freq: Every day | ORAL | Status: DC

## 2013-08-22 NOTE — Telephone Encounter (Signed)
Received fax refill request  Rx # [id number] Medication:  Digoxin 125 mcg tablet Qty 90 Sig:  Take one tablet every day Physician:  Domenic Polite

## 2013-08-24 ENCOUNTER — Ambulatory Visit (INDEPENDENT_AMBULATORY_CARE_PROVIDER_SITE_OTHER): Payer: Medicare Other | Admitting: *Deleted

## 2013-08-24 DIAGNOSIS — Z7901 Long term (current) use of anticoagulants: Secondary | ICD-10-CM

## 2013-08-24 DIAGNOSIS — I4891 Unspecified atrial fibrillation: Secondary | ICD-10-CM

## 2013-08-25 ENCOUNTER — Ambulatory Visit (HOSPITAL_COMMUNITY): Payer: Medicare Other | Attending: "Endocrinology

## 2013-08-26 ENCOUNTER — Telehealth: Payer: Self-pay | Admitting: Internal Medicine

## 2013-08-26 ENCOUNTER — Ambulatory Visit: Payer: Medicare Other

## 2013-08-26 NOTE — Telephone Encounter (Signed)
Spoke with Angie-states patient has a DEEP cough and Mucinex D has not helped much; slight chills (possibly from Shingles still). Angie aware that I am sending message to CY to advise if okay to get Xolair shot or wait/be seen for cough and chills.   Allergies  Allergen Reactions  . Amoxicillin-Pot Clavulanate   . Clarithromycin   . Clindamycin   . Doxycycline   . Penicillins     Current outpatient prescriptions:albuterol (PROVENTIL) (2.5 MG/3ML) 0.083% nebulizer solution, Take 3 mLs (2.5 mg total) by nebulization every 4 (four) hours as needed for wheezing or shortness of breath., Disp: 120 mL, Rfl: 12;  atorvastatin (LIPITOR) 10 MG tablet, Take 10 mg by mouth daily.  , Disp: , Rfl: ;  azithromycin (ZITHROMAX) 250 MG tablet, Take 250 mg by mouth daily., Disp: , Rfl:  Calcium Carbonate-Vitamin D (CALTRATE 600+D) 600-400 MG-UNIT per tablet, Take 1 tablet by mouth daily.  , Disp: , Rfl: ;  cetirizine (ZYRTEC) 10 MG tablet, Take 10 mg by mouth daily., Disp: , Rfl: ;  citalopram (CELEXA) 20 MG tablet, Take 20 mg by mouth daily. , Disp: , Rfl: ;  digoxin (DIGOX) 0.125 MG tablet, Take 1 tablet (0.125 mg total) by mouth daily., Disp: 90 tablet, Rfl: 3 diltiazem (CARDIZEM CD) 240 MG 24 hr capsule, Take 1 capsule (240 mg total) by mouth daily., Disp: 90 capsule, Rfl: 1;  EPINEPHrine (EPI-PEN) 0.3 MG/0.3ML DEVI, Inject 0.3 mg into the muscle as needed.  , Disp: , Rfl: ;  esomeprazole (NEXIUM) 40 MG capsule, Take 40 mg by mouth 2 (two) times daily.  , Disp: , Rfl: ;  furosemide (LASIX) 40 MG tablet, TAKE 1 TABLET (40 MG TOTAL) BY MOUTH DAILY., Disp: 30 tablet, Rfl: 10 mometasone (NASONEX) 50 MCG/ACT nasal spray, Place 2 sprays into the nose as needed. , Disp: , Rfl: ;  omalizumab (XOLAIR) 150 MG injection, 300 mg once every 28 days, Disp: 900 mg, Rfl: 3;  potassium chloride SA (KLOR-CON M20) 20 MEQ tablet, Take 1 tablet (20 mEq total) by mouth daily., Disp: 30 tablet, Rfl: 6;  predniSONE (DELTASONE) 10 MG tablet,  Take 10 mg by mouth daily., Disp: , Rfl:  PROAIR HFA 108 (90 BASE) MCG/ACT inhaler, INHALE 1-2 PUFFS EVERY 4 TO 6 HOURS AS NEEDED FOR WHEEZING, Disp: 8.5 each, Rfl: 1;  traMADol-acetaminophen (ULTRACET) 37.5-325 MG per tablet, Take 1 tablet by mouth every 4 (four) hours as needed., Disp: 90 tablet, Rfl: 5;  warfarin (COUMADIN) 5 MG tablet, TAKE 1 TABLET DAILY OR AS DIRECTED, Disp: 45 tablet, Rfl: 2

## 2013-08-26 NOTE — Telephone Encounter (Signed)
Ok to [employer], but please correct medication list indicating Arvid Right was stopped 12/4. I think this was an error.  If she is having fever or purulent/ sicker- do we need to work her in?

## 2013-08-26 NOTE — Telephone Encounter (Signed)
Spoke with Angie-caller for patient-no fevers or colored mucus. They are aware that it is okay for patient to get Xolair on Monday.

## 2013-08-29 ENCOUNTER — Telehealth: Payer: Self-pay | Admitting: Internal Medicine

## 2013-08-29 ENCOUNTER — Ambulatory Visit (INDEPENDENT_AMBULATORY_CARE_PROVIDER_SITE_OTHER): Payer: Medicare Other

## 2013-08-29 DIAGNOSIS — J45909 Unspecified asthma, uncomplicated: Secondary | ICD-10-CM

## 2013-08-29 MED ORDER — PREDNISONE 10 MG PO TABS: ORAL_TABLET | ORAL | Status: DC

## 2013-08-29 NOTE — Telephone Encounter (Signed)
Arrived for Xolair shot with family. Not acutely ill, white sputum. Plan- [facility]

## 2013-08-30 MED ORDER — OMALIZUMAB 150 MG ~~LOC~~ SOLR
300.0000 mg | Freq: Once | SUBCUTANEOUS | Status: AC
  Administered 2013-08-30: 300 mg via SUBCUTANEOUS

## 2013-09-04 DIAGNOSIS — B0229 Other postherpetic nervous system involvement: Secondary | ICD-10-CM | POA: Insufficient documentation

## 2013-09-04 NOTE — Assessment & Plan Note (Addendum)
Well controlled. Okay to continue Xolair

## 2013-09-04 NOTE — Assessment & Plan Note (Signed)
She can restart Xolair injections but wait on pneumonia vaccine

## 2013-09-04 NOTE — Assessment & Plan Note (Signed)
This appears stable and likely benign

## 2013-09-04 NOTE — Assessment & Plan Note (Signed)
Controlled ventricular response

## 2013-09-06 ENCOUNTER — Encounter: Payer: Self-pay | Admitting: Orthopedic Surgery

## 2013-09-06 ENCOUNTER — Ambulatory Visit (INDEPENDENT_AMBULATORY_CARE_PROVIDER_SITE_OTHER): Payer: Medicare Other | Admitting: Orthopedic Surgery

## 2013-09-06 VITALS — BP 100/48 | Ht 66.0 in | Wt 167.0 lb

## 2013-09-06 DIAGNOSIS — M549 Dorsalgia, unspecified: Secondary | ICD-10-CM

## 2013-09-06 DIAGNOSIS — B029 Zoster without complications: Secondary | ICD-10-CM

## 2013-09-06 MED ORDER — LIDOCAINE 5 % EX PTCH: 1.0000 | MEDICATED_PATCH | CUTANEOUS | Status: DC

## 2013-09-06 NOTE — Progress Notes (Signed)
Subjective:     Patient ID: [name], female   DOB: 09-25-1933, 79 y.o.   MRN: [id number]  Chief Complaint  Patient presents with  . Follow-up    3 month recheck back  . Herpes Zoster    Currently being treated for shingles    HPI  Patient has done well with oral medications regarding her lumbar spine condition she is currently battling shingles Review of Systems Difficulty breathing shortness of breath secondary to a bout of bronchitis    Objective:   Physical Exam BP 100/48  Ht 5\' 6"  (1.676 m)  Wt 167 lb (75.751 kg)  BMI 26.97 kg/m2 General appearance is normal, the patient is alert and oriented x3 with normal mood and affect. She has hoarseness of her throat and hoarse pitches affected by the recent bronchitis she has no evidence of rash under her arm but had a case of shingles still has residual pain and tenderness no redness of the skin is noted. No open sores are noted. Normal sensation in the right arm      Assessment:     Encounter Diagnoses  Name Primary?  . Shingles Yes  . Back pain         Plan:     Meds ordered this encounter  Medications  . lidocaine (LIDODERM) 5 %    Sig: Place 1 patch onto the skin daily. Remove & Discard patch within 12 hours or as directed by MD    Dispense:  30 patch    Refill:  0

## 2013-09-07 ENCOUNTER — Ambulatory Visit (INDEPENDENT_AMBULATORY_CARE_PROVIDER_SITE_OTHER): Payer: Medicare Other | Admitting: *Deleted

## 2013-09-07 DIAGNOSIS — I4891 Unspecified atrial fibrillation: Secondary | ICD-10-CM

## 2013-09-07 DIAGNOSIS — Z7901 Long term (current) use of anticoagulants: Secondary | ICD-10-CM

## 2013-09-07 LAB — POCT INR: INR: 3.4

## 2013-09-15 ENCOUNTER — Ambulatory Visit (HOSPITAL_COMMUNITY): Payer: Medicare PPO

## 2013-09-20 ENCOUNTER — Ambulatory Visit (HOSPITAL_COMMUNITY): Payer: Medicare PPO

## 2013-09-26 ENCOUNTER — Ambulatory Visit (INDEPENDENT_AMBULATORY_CARE_PROVIDER_SITE_OTHER): Payer: Medicare PPO

## 2013-09-26 ENCOUNTER — Ambulatory Visit (INDEPENDENT_AMBULATORY_CARE_PROVIDER_SITE_OTHER): Payer: Medicare PPO | Admitting: *Deleted

## 2013-09-26 DIAGNOSIS — J45909 Unspecified asthma, uncomplicated: Secondary | ICD-10-CM

## 2013-09-26 DIAGNOSIS — I4891 Unspecified atrial fibrillation: Secondary | ICD-10-CM

## 2013-09-26 DIAGNOSIS — Z7901 Long term (current) use of anticoagulants: Secondary | ICD-10-CM

## 2013-09-26 LAB — POCT INR: INR: 4.3

## 2013-09-27 MED ORDER — OMALIZUMAB 150 MG ~~LOC~~ SOLR
300.0000 mg | Freq: Once | SUBCUTANEOUS | Status: AC
  Administered 2013-09-27: 300 mg via SUBCUTANEOUS

## 2013-10-05 ENCOUNTER — Ambulatory Visit (INDEPENDENT_AMBULATORY_CARE_PROVIDER_SITE_OTHER): Payer: Medicare PPO | Admitting: *Deleted

## 2013-10-05 DIAGNOSIS — Z5181 Encounter for therapeutic drug level monitoring: Secondary | ICD-10-CM

## 2013-10-05 DIAGNOSIS — I4891 Unspecified atrial fibrillation: Secondary | ICD-10-CM

## 2013-10-05 DIAGNOSIS — Z7901 Long term (current) use of anticoagulants: Secondary | ICD-10-CM

## 2013-10-05 LAB — POCT INR: INR: 2.4

## 2013-10-18 ENCOUNTER — Other Ambulatory Visit: Payer: Self-pay | Admitting: Cardiology

## 2013-10-19 ENCOUNTER — Ambulatory Visit (INDEPENDENT_AMBULATORY_CARE_PROVIDER_SITE_OTHER): Payer: Medicare PPO | Admitting: *Deleted

## 2013-10-19 DIAGNOSIS — Z5181 Encounter for therapeutic drug level monitoring: Secondary | ICD-10-CM

## 2013-10-19 DIAGNOSIS — I4891 Unspecified atrial fibrillation: Secondary | ICD-10-CM

## 2013-10-19 DIAGNOSIS — Z7901 Long term (current) use of anticoagulants: Secondary | ICD-10-CM

## 2013-10-19 LAB — POCT INR: INR: 1.8

## 2013-10-26 ENCOUNTER — Ambulatory Visit (INDEPENDENT_AMBULATORY_CARE_PROVIDER_SITE_OTHER): Payer: Medicare PPO | Admitting: *Deleted

## 2013-10-26 DIAGNOSIS — Z5181 Encounter for therapeutic drug level monitoring: Secondary | ICD-10-CM

## 2013-10-26 DIAGNOSIS — I4891 Unspecified atrial fibrillation: Secondary | ICD-10-CM

## 2013-10-26 DIAGNOSIS — Z7901 Long term (current) use of anticoagulants: Secondary | ICD-10-CM

## 2013-10-26 LAB — POCT INR: INR: 1.9

## 2013-10-27 ENCOUNTER — Ambulatory Visit: Payer: Medicare PPO

## 2013-11-02 ENCOUNTER — Ambulatory Visit (INDEPENDENT_AMBULATORY_CARE_PROVIDER_SITE_OTHER): Payer: Medicare PPO

## 2013-11-02 DIAGNOSIS — J45909 Unspecified asthma, uncomplicated: Secondary | ICD-10-CM

## 2013-11-04 MED ORDER — OMALIZUMAB 150 MG ~~LOC~~ SOLR
300.0000 mg | Freq: Once | SUBCUTANEOUS | Status: AC
Start: 2013-11-04 — End: 2013-11-04
  Administered 2013-11-04: 300 mg via SUBCUTANEOUS

## 2013-11-09 ENCOUNTER — Ambulatory Visit (INDEPENDENT_AMBULATORY_CARE_PROVIDER_SITE_OTHER): Payer: Medicare PPO | Admitting: *Deleted

## 2013-11-09 DIAGNOSIS — Z7901 Long term (current) use of anticoagulants: Secondary | ICD-10-CM

## 2013-11-09 DIAGNOSIS — I4891 Unspecified atrial fibrillation: Secondary | ICD-10-CM

## 2013-11-09 DIAGNOSIS — Z5181 Encounter for therapeutic drug level monitoring: Secondary | ICD-10-CM

## 2013-11-09 LAB — POCT INR: INR: 2.6

## 2013-11-16 ENCOUNTER — Other Ambulatory Visit: Payer: Self-pay | Admitting: Internal Medicine

## 2013-11-23 ENCOUNTER — Telehealth: Payer: Self-pay | Admitting: Internal Medicine

## 2013-11-23 NOTE — Telephone Encounter (Signed)
Spoke with pharmacy-they needed to ship Xolair; shipment scheduled for 11-29-13 as patient has injection appt on 11-30-13. Will send to [facility] as FYI.

## 2013-11-24 ENCOUNTER — Encounter: Payer: Self-pay | Admitting: Cardiology

## 2013-11-28 ENCOUNTER — Telehealth: Payer: Self-pay | Admitting: Internal Medicine

## 2013-11-28 NOTE — Telephone Encounter (Signed)
Called and spoke with [facility]. They were checking on the status of PA for pt xolair. Was advised needed to call [phone] to initiate this. Called spoke with [place]. He is going to fax the questionnaire to triage. Ref # [id number] Will await fax

## 2013-11-29 ENCOUNTER — Other Ambulatory Visit: Payer: Self-pay | Admitting: Cardiology

## 2013-11-29 MED ORDER — DILTIAZEM HCL ER COATED BEADS 240 MG PO CP24: 240.0000 mg | ORAL_CAPSULE | Freq: Every day | ORAL | Status: DC

## 2013-11-29 NOTE — Telephone Encounter (Signed)
Fax received and giving to [place].

## 2013-11-30 ENCOUNTER — Ambulatory Visit (INDEPENDENT_AMBULATORY_CARE_PROVIDER_SITE_OTHER): Payer: Medicare PPO | Admitting: *Deleted

## 2013-11-30 ENCOUNTER — Ambulatory Visit: Payer: Medicare PPO

## 2013-11-30 DIAGNOSIS — Z5181 Encounter for therapeutic drug level monitoring: Secondary | ICD-10-CM

## 2013-11-30 DIAGNOSIS — Z7901 Long term (current) use of anticoagulants: Secondary | ICD-10-CM

## 2013-11-30 DIAGNOSIS — I4891 Unspecified atrial fibrillation: Secondary | ICD-10-CM

## 2013-11-30 LAB — POCT INR: INR: 2.2

## 2013-12-01 ENCOUNTER — Ambulatory Visit (INDEPENDENT_AMBULATORY_CARE_PROVIDER_SITE_OTHER): Payer: Medicare PPO | Admitting: Internal Medicine

## 2013-12-01 ENCOUNTER — Encounter: Payer: Self-pay | Admitting: Internal Medicine

## 2013-12-01 VITALS — BP 116/70 | HR 83 | Ht 66.0 in | Wt 168.6 lb

## 2013-12-01 DIAGNOSIS — J449 Chronic obstructive pulmonary disease, unspecified: Secondary | ICD-10-CM

## 2013-12-01 DIAGNOSIS — J45909 Unspecified asthma, uncomplicated: Secondary | ICD-10-CM

## 2013-12-01 DIAGNOSIS — J301 Allergic rhinitis due to pollen: Secondary | ICD-10-CM

## 2013-12-01 MED ORDER — METHYLPREDNISOLONE ACETATE 80 MG/ML IJ SUSP
80.0000 mg | Freq: Once | INTRAMUSCULAR | Status: AC
  Administered 2013-12-01: 80 mg via INTRAMUSCULAR

## 2013-12-01 MED ORDER — PREDNISONE 10 MG PO TABS: ORAL_TABLET | ORAL | Status: DC

## 2013-12-01 NOTE — Patient Instructions (Signed)
Order CXR   Dx asthma with bronchitis  Depo 61  Script sent for prednsione

## 2013-12-01 NOTE — Progress Notes (Signed)
Patient ID: [name], female    DOB: 09-15-1933, 80 y.o.   MRN: [id number]  HPI 7/113/12-  76 yoF former smoker, followed for Chronic asthma/ COPD, chronic rhinosinusitis, complicated by hx AFib/ anticoagulation, CHF, cardiomyopathy, mitral insufficiency, GERD Last here December 11/19/10- note reviewed She has felt well controlled with no acute breathing issues. She saw Dr Domenic Polite for cardiac f/u 2 weeks ago and Echo was good with no med changes. Uses rescue inhaler only occasionally, usually on first rising. Little wheeze at times, much better. Little DOE with ADLs PFT 12/06/10= chronic obstructive asthma   7/113/12-  76 yoF former smoker, followed for Chronic asthma/ COPD, chronic rhinosinusitis, complicated by hx AFib/ anticoagulation, CHF, cardiomyopathy, mitral insufficiency, GERD Has had flu vaccine. Persistent head and chest congestion for the past 2 weeks. On November 7 we sent  Avelox for 5 days. This helped but didn't last long enough. She took a leftover prednisone taper but wants to avoid more prednisone now if possible. She has noted mild tremor in her hands for the past month no lateralizing finding. She continues Xolair injections. Using nebulizer or rescue inhaler only every 1 or 2 days. Has lost some weight and admits she has been dieting some but otherwise she's not sure that explains it. Thyroid function test normal in the past.  11/27/11- 76 yoF former smoker, followed for Chronic asthma/ COPD, chronic rhinosinusitis, complicated by hx AFib/ anticoagulation, CHF, cardiomyopathy, mitral insufficiency, GERD, Lung nodules Continues to do well. Continues Xolair. She thyroid nodule biopsy by interventional radiology. Waiting for results. Eyes have been watering some increased nasal drainage. We discussed possibility of seasonal allergic symptoms when she is getting Xolair. CT chest 10/20/2011-bilateral nodules less than 10 mm. Needs followup CT scan in May.  02/05/12- 76 yoF  former smoker, followed for Chronic asthma/ COPD, chronic rhinosinusitis, complicated by hx AFib/ anticoagulation, CHF, cardiomyopathy, mitral insufficiency, GERD, Lung nodules Denies any cough, congestion, SOB, or wheezing, Slight flare up with allergies at this time and still on Xolair. CAT score 8 She is pleased to be holding her current weight. Sometimes has a little wheeze but not much rarely needs a rescue inhaler and uses her nebulizer 2 or 3 times per week. Continues Xolair and believes it helps. Denies reflux or heartburn. CT chest  01/29/12- images reviewed with her. IMPRESSION:  1. Scattered bilateral pulmonary nodules are unchanged in size  from 10/22/2011. Additional follow-up CT chest without contrast  could be performed in 6 months to ensure continued stability, as  clinically indicated. This recommendation follows the consensus  statement: Guidelines for Management of Small Pulmonary Nodules  Detected on CT Scans: A Statement from the [facility] as  published in Radiology 2005; 237:395-400. If multidisciplinary  follow up management is desired, this is available in the [facility] through the [facility] [phone]  [phone].  2. Dominant right thyroid nodule. Consider further evaluation with  thyroid ultrasound. If patient is clinically hyperthyroid,  consider nuclear medicine thyroid uptake and scan.  Original Report Authenticated By: Luretha Rued, M.D.    08/09/12- 36 yoF former smoker, followed for Chronic asthma/ COPD, chronic rhinosinusitis, complicated by hx AFib/ anticoagulation, CHF, cardiomyopathy, mitral insufficiency, GERD, Lung nodules FOLLOWS FOR: denies any SOB, wheezing or cough. States she is having chest/throat congestion. For 2 weeks has had "congestion" in throat and chest. Dr. Hilma Favors treated Levaquin and it one week ago. She denies purulent discharge, fever or wheeze. Echocardiogram 05/03/2012-further improved. EF  60-65% with mitral regurgitation and mitral valve prolapse. Chest refill Valium use occasionally. CT 08/04/12-reviewed with her IMPRESSION:  1. Stable bilateral pulmonary nodules. No new pulmonary nodules  are noted. A follow-up examination in 1 year is recommended to  assure stability.  2. Stable 2 cm low density nodule in the right lobe of the thyroid  gland.  3. No acute infiltrate or pulmonary edema. Small hiatal hernia  again noted.  Original Report Authenticated By: Lahoma Crocker, M.D.  02/07/13- 39 yoF former smoker, followed for Chronic asthma/ COPD, chronic rhinosinusitis, complicated by hx AFib/ anticoagulation, CHF, cardiomyopathy, mitral insufficiency, GERD, Lung nodules FOLLOWS FOR: still on Xolair; drainage in throat. Denies any flare ups with asthma or allergies at this time. Taking Zyrtec. Some postnasal drip. Had thyroid nodule biopsied.  08/11/13- 79 yoF former smoker, followed for Chronic asthma/ COPD, chronic rhinosinusitis, complicated by hx AFib/ anticoagulation, CHF, cardiomyopathy, mitral insufficiency, GERD, Lung nodules FOLLOWS FOR: has had shingles for aobut 4 weeks; still on Xolair and plans to get injection for December as she did not take November's injection. Shingles right lateral rib cage- valacyclovir Rx'd. Still hurts. It is not needing oxygen. CT 07/15/13  IMPRESSION:  1. No acute cardiopulmonary abnormalities.  2. Stable nodules from 10/22/2011. These are unchanged for almost 19  months and are likely benign.  3. Atherosclerotic disease.  4. Hiatal hernia status post fundoplication.  Electronically Signed  By: Kerby Moors M.D.  On: 07/14/2013 13:04  12/01/13- 79 yoF former smoker, followed for Chronic asthma/ COPD, chronic rhinosinusitis, complicated by hx AFib/ anticoagulation, CHF, cardiomyopathy, mitral insufficiency, GERD, Lung nodules ACUTE VISIT: stuffy head, coughing so hard (mainly at night) that she isnt sleeping. Productive at times-white in  color. Due for Xolair today however working with Insurance to approve. Ended prednisone 10 mg daily maintenance 3 weeks ago. Since then has felt more washed out and tired. Had lab work indicating no heart failure, but kidney function poor> Nephrology. Increased nasal congestion. Cough keeps her awake. Some wheeze. Using nebulizer 3 or 4 times daily, Xolair on schedule.  Review of Systems-see HPI Constitutional:    weight loss, night sweats, fevers, chills, +fatigue, lassitude. HEENT:   No-  headaches, difficulty swallowing, tooth/dental problems, sore throat,       No-  sneezing, itching, ear ache, +less nasal congestion, +post nasal drip,  CV:  +postherpetic R chest pain, no-her her aorthopnea, PND, swelling in lower extremities, anasarca, dizziness, palpitations Resp: No- acute  shortness of breath with exertion or at rest.              No-   productive cough,  +non-productive cough,  No- coughing up of blood.              No-   change in color of mucus. + wheezing.   Skin: No-   rash or lesions. GI:  No-   heartburn, indigestion, abdominal pain, nausea, vomiting,  GU: . MS:  No-   joint pain or swelling.  Neuro-     nothing unusual Psych:  No- change in mood or affect. No depression or anxiety.  No memory loss.  Objective:   Physical Exam General- Alert, Oriented, Affect-appropriate/ cheerful, Distress- none acute, thin Skin- no rash visible now on right lateral ribs Lymphadenopathy- none Head- atraumatic            Eyes- Gross vision intact, PERRLA, conjunctivae clear secretions            Ears- Hearing, canals-normal  Nose- +sniffing, turbinate edema, no-Septal dev, mucus, polyps, erosion, perforation             Throat- Mallampati II , mucosa clear/ not red , drainage- none, tonsils- atrophic,                          dentures.+ Mild hoarseness .+ Large torus hard palate Neck- flexible , trachea midline, no stridor , thyroid nl, carotid no bruit Chest - symmetrical  excursion , unlabored           Heart/CV- +IRR/ AFib well controlled , + faint ? Mitral regurg murmur , no gallop  , no rub, nl s1 s2                           - JVD- none , edema- none, stasis changes- none, varices- none           Lung- clear, wheeze+wet coarse, cough- none , dullness-none, rub- none           Chest wall-  Abd-  Br/ Gen/ Rectal- Not done, not indicated Extrem- cyanosis- none, clubbing, none, atrophy- none, strength- nl Neuro- slight resting tremor

## 2013-12-02 ENCOUNTER — Telehealth: Payer: Self-pay | Admitting: *Deleted

## 2013-12-02 NOTE — Telephone Encounter (Signed)
Pt saw Dr Annamaria Boots yesterday for asthma, COPD.  She was given a shot of Depo 80mg  and Prednisone 10mg  (3 tabs x 2, 2 tabs x 2 then 1 tab daily).  Pt held coumadin last night on her own.  Told pt to take 1/2 tablet coumadin x 3 days (F,Sat,Sun) then Monday resume 1 tablet daily.  INR appt made for 12/07/13.   Pt verbalized understanding.

## 2013-12-05 ENCOUNTER — Telehealth: Payer: Self-pay | Admitting: Internal Medicine

## 2013-12-05 MED ORDER — OMALIZUMAB 150 MG ~~LOC~~ SOLR: 300.0000 mg | SUBCUTANEOUS | Status: DC

## 2013-12-05 NOTE — Telephone Encounter (Signed)
Rx was sent to the wrong pharmacy on 11/17/13. Rx has been sent to [facility]. Nothing further was needed.

## 2013-12-07 ENCOUNTER — Ambulatory Visit (INDEPENDENT_AMBULATORY_CARE_PROVIDER_SITE_OTHER): Payer: Medicare PPO | Admitting: *Deleted

## 2013-12-07 DIAGNOSIS — Z5181 Encounter for therapeutic drug level monitoring: Secondary | ICD-10-CM

## 2013-12-07 DIAGNOSIS — Z7901 Long term (current) use of anticoagulants: Secondary | ICD-10-CM

## 2013-12-07 DIAGNOSIS — I4891 Unspecified atrial fibrillation: Secondary | ICD-10-CM

## 2013-12-07 LAB — POCT INR: INR: 1.5

## 2013-12-11 ENCOUNTER — Other Ambulatory Visit: Payer: Self-pay | Admitting: Adult Health

## 2013-12-12 ENCOUNTER — Telehealth: Payer: Self-pay | Admitting: Internal Medicine

## 2013-12-12 NOTE — Telephone Encounter (Signed)
Spoke with the pt  She states that she is needing [place] to call [facility] to advise okay to ship her Xolair  I called and spoke with rep at [facility] and what they are needing is a PA for Xolair  I called pt back and notified her of this Joellen Jersey, please advise if you have the PA info, they faxed it on last Thurs Thanks!

## 2013-12-12 NOTE — Telephone Encounter (Signed)
I am working with patients insurance company on this; I have had to wait for paper records on this patient.

## 2013-12-13 ENCOUNTER — Ambulatory Visit: Payer: Medicare PPO | Admitting: Cardiology

## 2013-12-16 NOTE — Telephone Encounter (Signed)
PA form faxed back to [facility] as URGENT request. Had to get information on patient and CY's signature.

## 2013-12-20 NOTE — Telephone Encounter (Signed)
[employer] faxed back PA stating the Xolair was denied under Medicare Part D but was covered under Medicare Part B; I called [facility] at [phone] and gave this information. They were sending e-mail to billing department for them to re run the benefits for patient-this was on Friday 12-16-13. Rep scheduled shipment for patients Xolair on Tuesday 12-20-13. I called today to make sure benefits were ran and Xolair was being shipped. I spoke with new rep who advised that they have not received any information back from their billing department regarding coverage under Medicare Part B; therefore, they could not ship patients Xolair. I explained the urgency of patients medication needed and they re-emailed billing STAT/URGENT request as patient needs her med NOW and can not/should not wait any longer.    [facility] to call our office to schedule shipment once billing has handled their side of things. Tammy Scott in allergy lab aware of this and will hold message in her in-basket until she gets a call back from [place] and as a reminder to contact them if no called received by Wednesday afternoon.

## 2013-12-21 ENCOUNTER — Ambulatory Visit (INDEPENDENT_AMBULATORY_CARE_PROVIDER_SITE_OTHER): Payer: Medicare PPO | Admitting: *Deleted

## 2013-12-21 DIAGNOSIS — I4891 Unspecified atrial fibrillation: Secondary | ICD-10-CM

## 2013-12-21 DIAGNOSIS — Z5181 Encounter for therapeutic drug level monitoring: Secondary | ICD-10-CM

## 2013-12-21 DIAGNOSIS — Z7901 Long term (current) use of anticoagulants: Secondary | ICD-10-CM

## 2013-12-21 LAB — POCT INR: INR: 2.1

## 2013-12-22 NOTE — Telephone Encounter (Signed)
I called [facility] to set up shipment this morning. Her [name] will be here sometime tomorrow. I called [name] she is coming in Monday for her shots.(She's can't come 12/23/2013.)

## 2013-12-22 NOTE — Telephone Encounter (Signed)
Lewis from [facility] returned call to nurse.  Please call back at [phone]

## 2013-12-23 ENCOUNTER — Other Ambulatory Visit: Payer: Self-pay | Admitting: Adult Health

## 2013-12-25 NOTE — Assessment & Plan Note (Signed)
Asthma versus fluid retention given history of heart and kidney disease. Consider possible  Adrenal insufficiency Plan-chest x-ray, Depo-Medrol, prednisone taper/steroid talk

## 2013-12-25 NOTE — Assessment & Plan Note (Signed)
Mild seasonal exacerbation 

## 2013-12-26 ENCOUNTER — Ambulatory Visit (INDEPENDENT_AMBULATORY_CARE_PROVIDER_SITE_OTHER): Payer: Medicare PPO | Admitting: Cardiology

## 2013-12-26 ENCOUNTER — Ambulatory Visit (INDEPENDENT_AMBULATORY_CARE_PROVIDER_SITE_OTHER): Payer: Medicare PPO

## 2013-12-26 ENCOUNTER — Encounter: Payer: Self-pay | Admitting: Cardiology

## 2013-12-26 VITALS — BP 110/50 | HR 80 | Ht 66.0 in | Wt 176.0 lb

## 2013-12-26 DIAGNOSIS — I059 Rheumatic mitral valve disease, unspecified: Secondary | ICD-10-CM

## 2013-12-26 DIAGNOSIS — I4891 Unspecified atrial fibrillation: Secondary | ICD-10-CM

## 2013-12-26 DIAGNOSIS — J45909 Unspecified asthma, uncomplicated: Secondary | ICD-10-CM

## 2013-12-26 MED ORDER — OMALIZUMAB 150 MG ~~LOC~~ SOLR
300.0000 mg | Freq: Once | SUBCUTANEOUS | Status: AC
  Administered 2013-12-26: 300 mg via SUBCUTANEOUS

## 2013-12-26 NOTE — Progress Notes (Signed)
Clinical Summary [name] is a 80 y.o.female last seen in October 2014. She has done reasonably well from a cardiac perspective. Still has difficulty with recurring bronchitis and is followed by Dr. Annamaria Boots. She reports no major sense of palpitations, has had no dizziness or syncope.  ECG today shows rate-controlled atrial fibrillation with borderline low voltage, nonspecific ST-T changes. She reports no bleeding problems on Coumadin.  Followup echocardiogram in September 2014 showed mild LVH with LVEF 55-60%, indeterminate diastolic function, mitral prolapse with moderate posteriorly directed mitral regurgitation, moderate to severe left atrial enlargement, mild RV and RA enlargement.    Allergies  Allergen Reactions  . Amoxicillin-Pot Clavulanate   . Clarithromycin   . Clindamycin   . Doxycycline   . Penicillins     Current Outpatient Prescriptions  Medication Sig Dispense Refill  . atorvastatin (LIPITOR) 10 MG tablet Take 10 mg by mouth daily.        . Calcium Carbonate-Vitamin D (CALTRATE 600+D) 600-400 MG-UNIT per tablet Take 1 tablet by mouth daily.        . cetirizine (ZYRTEC) 10 MG tablet Take 10 mg by mouth daily.      . citalopram (CELEXA) 20 MG tablet Take 20 mg by mouth daily.       . digoxin (DIGOX) 0.125 MG tablet Take 1 tablet (0.125 mg total) by mouth daily.  90 tablet  3  . diltiazem (CARDIZEM CD) 240 MG 24 hr capsule Take 1 capsule (240 mg total) by mouth daily.  90 capsule  3  . EPINEPHrine (EPI-PEN) 0.3 MG/0.3ML DEVI Inject 0.3 mg into the muscle as needed.        Marland Kitchen esomeprazole (NEXIUM) 40 MG capsule Take 40 mg by mouth 2 (two) times daily.        . furosemide (LASIX) 40 MG tablet TAKE 1 TABLET (40 MG TOTAL) BY MOUTH DAILY.  90 tablet  3  . lidocaine (LIDODERM) 5 % Place 1 patch onto the skin daily. Remove & Discard patch within 12 hours or as directed by MD  30 patch  0  . mometasone (NASONEX) 50 MCG/ACT nasal spray Place 2 sprays into the nose as needed.        Marland Kitchen omalizumab (XOLAIR) 150 MG injection Inject 300 mg into the skin every 28 (twenty-eight) days.  2 vial  2  . potassium chloride SA (KLOR-CON M20) 20 MEQ tablet Take 1 tablet (20 mEq total) by mouth daily.  30 tablet  6  . predniSONE (DELTASONE) 10 MG tablet 3 daily x 2 days, 2 daily x 2 days, then one daily  36 tablet  2  . PROAIR HFA 108 (90 BASE) MCG/ACT inhaler INHALE 1-2 PUFFS EVERY 4 TO 6 HOURS AS NEEDED FOR WHEEZING  8.5 each  1  . traMADol-acetaminophen (ULTRACET) 37.5-325 MG per tablet Take 1 tablet by mouth every 4 (four) hours as needed.  90 tablet  5  . warfarin (COUMADIN) 5 MG tablet TAKE 1 TABLET DAILY OR AS DIRECTED  45 tablet  2  . albuterol (PROVENTIL) (2.5 MG/3ML) 0.083% nebulizer solution Take 3 mLs (2.5 mg total) by nebulization every 4 (four) hours as needed for wheezing or shortness of breath.  120 mL  12   No current facility-administered medications for this visit.    Past Medical History  Diagnosis Date  . Asthma   . Atrial fibrillation   . COPD (chronic obstructive pulmonary disease)   . GERD (gastroesophageal reflux disease)   . Mitral  valve prolapse   . Nonischemic cardiomyopathy     LVEF 25-30% improved to 45-50%  . Chronic rhinitis   . Obstructive sleep apnea   . Transudative pleural effusion   . Nasal polyps   . Mitral regurgitation     Mild to moderate 5/12    Social History Ms. Sparaco reports that she quit smoking about 33 years ago. Her smoking use included Cigarettes. She has a 10 pack-year smoking history. She has never used smokeless tobacco. Ms. Jablonowski reports that she does not drink alcohol.  Review of Systems Has had some mild leg edema since being on prednisone taper. No orthopnea or PND. Stable appetite. No fevers or chills. Otherwise as outlined.  Physical Examination Filed Vitals:   12/26/13 1533  BP: 110/50  Pulse: 80   Filed Weights   12/26/13 1533  Weight: 176 lb (79.833 kg)    Elderly woman, appears comfortable at  rest. HEENT: Lids normal, conjunctiva normal, oropharynx clear with moist mucosa.  Neck: Supple, no elevated JVP or carotid bruits, no thyromegaly.  Lungs: Coarse breath sounds with mildly prolonged expiratory phase, no labored breathing.  Cardiac: Irregularly irregular, soft apical systolic murmur, no pericardial rub.  Abdomen: Soft, nontender, bowel sound present.  Skin: Warm and dry.  Extremities: No pitting edema below the knees.  Musculoskeletal: No kyphosis.  Neuropsychiatric: Alert and oriented x3, affect appropriate.   Problem List and Plan   Atrial fibrillation Continue current cardiac regimen with strategy of heart rate control and anticoagulation.  MITRAL VALVE PROLAPSE Moderate bileaflet prolapse, anterior greater than posterior, associated with stable moderate mitral regurgitation by echocardiogram over the last 6 months. Examination is stable. Continue observation.    Satira Sark, M.D., F.A.C.C.

## 2013-12-26 NOTE — Assessment & Plan Note (Signed)
Moderate bileaflet prolapse, anterior greater than posterior, associated with stable moderate mitral regurgitation by echocardiogram over the last 6 months. Examination is stable. Continue observation.

## 2013-12-26 NOTE — Assessment & Plan Note (Signed)
Continue current cardiac regimen with strategy of heart rate control and anticoagulation.

## 2013-12-26 NOTE — Patient Instructions (Signed)
Your physician wants you to follow-up in: 6 months You will receive a reminder letter in the mail two months in advance. If you don't receive a letter, please call our office to schedule the follow-up appointment.     Your physician recommends that you continue on your current medications as directed. Please refer to the Current Medication list given to you today.      Thank you for choosing [facility] !        

## 2013-12-27 ENCOUNTER — Other Ambulatory Visit: Payer: Self-pay | Admitting: Adult Health

## 2014-01-04 ENCOUNTER — Ambulatory Visit (INDEPENDENT_AMBULATORY_CARE_PROVIDER_SITE_OTHER): Payer: Medicare PPO | Admitting: *Deleted

## 2014-01-04 DIAGNOSIS — I4891 Unspecified atrial fibrillation: Secondary | ICD-10-CM

## 2014-01-04 DIAGNOSIS — Z7901 Long term (current) use of anticoagulants: Secondary | ICD-10-CM

## 2014-01-04 DIAGNOSIS — Z5181 Encounter for therapeutic drug level monitoring: Secondary | ICD-10-CM

## 2014-01-04 LAB — POCT INR: INR: 1.9

## 2014-01-25 ENCOUNTER — Ambulatory Visit (INDEPENDENT_AMBULATORY_CARE_PROVIDER_SITE_OTHER): Payer: Medicare PPO | Admitting: *Deleted

## 2014-01-25 ENCOUNTER — Ambulatory Visit (INDEPENDENT_AMBULATORY_CARE_PROVIDER_SITE_OTHER): Payer: Medicare PPO

## 2014-01-25 DIAGNOSIS — I4891 Unspecified atrial fibrillation: Secondary | ICD-10-CM

## 2014-01-25 DIAGNOSIS — Z5181 Encounter for therapeutic drug level monitoring: Secondary | ICD-10-CM

## 2014-01-25 DIAGNOSIS — J45909 Unspecified asthma, uncomplicated: Secondary | ICD-10-CM

## 2014-01-25 DIAGNOSIS — Z7901 Long term (current) use of anticoagulants: Secondary | ICD-10-CM

## 2014-01-25 LAB — POCT INR: INR: 2.2

## 2014-01-25 MED ORDER — OMALIZUMAB 150 MG ~~LOC~~ SOLR
300.0000 mg | Freq: Once | SUBCUTANEOUS | Status: AC
  Administered 2014-01-25: 300 mg via SUBCUTANEOUS

## 2014-01-31 ENCOUNTER — Ambulatory Visit (INDEPENDENT_AMBULATORY_CARE_PROVIDER_SITE_OTHER): Payer: Medicare PPO | Admitting: Internal Medicine

## 2014-01-31 ENCOUNTER — Encounter: Payer: Self-pay | Admitting: Internal Medicine

## 2014-01-31 VITALS — BP 110/74 | HR 77 | Ht 66.0 in | Wt 171.4 lb

## 2014-01-31 DIAGNOSIS — J301 Allergic rhinitis due to pollen: Secondary | ICD-10-CM

## 2014-01-31 DIAGNOSIS — I4891 Unspecified atrial fibrillation: Secondary | ICD-10-CM

## 2014-01-31 DIAGNOSIS — J449 Chronic obstructive pulmonary disease, unspecified: Secondary | ICD-10-CM

## 2014-01-31 DIAGNOSIS — I482 Chronic atrial fibrillation, unspecified: Secondary | ICD-10-CM

## 2014-01-31 NOTE — Progress Notes (Signed)
Patient ID: [name], female    DOB: 05/22/1934, 80 y.o.   MRN: [id number]  HPI 7/113/12-  76 yoF former smoker, followed for Chronic asthma/ COPD, chronic rhinosinusitis, complicated by hx AFib/ anticoagulation, CHF, cardiomyopathy, mitral insufficiency, GERD Last here December 11/19/10- note reviewed She has felt well controlled with no acute breathing issues. She saw Dr Domenic Polite for cardiac f/u 2 weeks ago and Echo was good with no med changes. Uses rescue inhaler only occasionally, usually on first rising. Little wheeze at times, much better. Little DOE with ADLs PFT 12/06/10= chronic obstructive asthma   7/113/12-  76 yoF former smoker, followed for Chronic asthma/ COPD, chronic rhinosinusitis, complicated by hx AFib/ anticoagulation, CHF, cardiomyopathy, mitral insufficiency, GERD Has had flu vaccine. Persistent head and chest congestion for the past 2 weeks. On November 7 we sent  Avelox for 5 days. This helped but didn't last long enough. She took a leftover prednisone taper but wants to avoid more prednisone now if possible. She has noted mild tremor in her hands for the past month no lateralizing finding. She continues Xolair injections. Using nebulizer or rescue inhaler only every 1 or 2 days. Has lost some weight and admits she has been dieting some but otherwise she's not sure that explains it. Thyroid function test normal in the past.  11/27/11- 76 yoF former smoker, followed for Chronic asthma/ COPD, chronic rhinosinusitis, complicated by hx AFib/ anticoagulation, CHF, cardiomyopathy, mitral insufficiency, GERD, Lung nodules Continues to do well. Continues Xolair. She thyroid nodule biopsy by interventional radiology. Waiting for results. Eyes have been watering some increased nasal drainage. We discussed possibility of seasonal allergic symptoms when she is getting Xolair. CT chest 10/20/2011-bilateral nodules less than 10 mm. Needs followup CT scan in May.  02/05/12- 76 yoF  former smoker, followed for Chronic asthma/ COPD, chronic rhinosinusitis, complicated by hx AFib/ anticoagulation, CHF, cardiomyopathy, mitral insufficiency, GERD, Lung nodules Denies any cough, congestion, SOB, or wheezing, Slight flare up with allergies at this time and still on Xolair. CAT score 8 She is pleased to be holding her current weight. Sometimes has a little wheeze but not much rarely needs a rescue inhaler and uses her nebulizer 2 or 3 times per week. Continues Xolair and believes it helps. Denies reflux or heartburn. CT chest  01/29/12- images reviewed with her. IMPRESSION:  1. Scattered bilateral pulmonary nodules are unchanged in size  from 10/22/2011. Additional follow-up CT chest without contrast  could be performed in 6 months to ensure continued stability, as  clinically indicated. This recommendation follows the consensus  statement: Guidelines for Management of Small Pulmonary Nodules  Detected on CT Scans: A Statement from the [place] as  published in Radiology 2005; 237:395-400. If multidisciplinary  follow up management is desired, this is available in the [facility] through the [facility] [id number]-  3200.  2. Dominant right thyroid nodule. Consider further evaluation with  thyroid ultrasound. If patient is clinically hyperthyroid,  consider nuclear medicine thyroid uptake and scan.  Original Report Authenticated By: Luretha Rued, M.D.    08/09/12- 18 yoF former smoker, followed for Chronic asthma/ COPD, chronic rhinosinusitis, complicated by hx AFib/ anticoagulation, CHF, cardiomyopathy, mitral insufficiency, GERD, Lung nodules FOLLOWS FOR: denies any SOB, wheezing or cough. States she is having chest/throat congestion. For 2 weeks has had "congestion" in throat and chest. Dr. Hilma Favors treated Levaquin and it one week ago. She denies purulent discharge, fever or wheeze. Echocardiogram 05/03/2012-further improved. EF  60-65% with mitral regurgitation and mitral valve prolapse. Chest refill Valium use occasionally. CT 08/04/12-reviewed with her IMPRESSION:  1. Stable bilateral pulmonary nodules. No new pulmonary nodules  are noted. A follow-up examination in 1 year is recommended to  assure stability.  2. Stable 2 cm low density nodule in the right lobe of the thyroid  gland.  3. No acute infiltrate or pulmonary edema. Small hiatal hernia  again noted.  Original Report Authenticated By: Lahoma Crocker, M.D.  02/07/13- 68 yoF former smoker, followed for Chronic asthma/ COPD, chronic rhinosinusitis, complicated by hx AFib/ anticoagulation, CHF, cardiomyopathy, mitral insufficiency, GERD, Lung nodules FOLLOWS FOR: still on Xolair; drainage in throat. Denies any flare ups with asthma or allergies at this time. Taking Zyrtec. Some postnasal drip. Had thyroid nodule biopsied.  08/11/13- 79 yoF former smoker, followed for Chronic asthma/ COPD, chronic rhinosinusitis, complicated by hx AFib/ anticoagulation, CHF, cardiomyopathy, mitral insufficiency, GERD, Lung nodules FOLLOWS FOR: has had shingles for aobut 4 weeks; still on Xolair and plans to get injection for December as she did not take November's injection. Shingles right lateral rib cage- valacyclovir Rx'd. Still hurts. It is not needing oxygen. CT 07/15/13  IMPRESSION:  1. No acute cardiopulmonary abnormalities.  2. Stable nodules from 10/22/2011. These are unchanged for almost 19  months and are likely benign.  3. Atherosclerotic disease.  4. Hiatal hernia status post fundoplication.  Electronically Signed  By: Kerby Moors M.D.  On: 07/14/2013 13:04  12/01/13- 79 yoF former smoker, followed for Chronic asthma/ COPD, chronic rhinosinusitis, complicated by hx AFib/ anticoagulation, CHF, cardiomyopathy, mitral insufficiency, GERD, Lung nodules ACUTE VISIT: stuffy head, coughing so hard (mainly at night) that she isnt sleeping. Productive at times-white in  color. Due for Xolair today however working with Insurance to approve. Ended prednisone 10 mg daily maintenance 3 weeks ago. Since then has felt more washed out and tired. Had lab work indicating no heart failure, but kidney function poor> Nephrology. Increased nasal congestion. Cough keeps her awake. Some wheeze. Using nebulizer 3 or 4 times daily, Xolair on schedule.  01/31/14- 79 yoF former smoker, followed for Chronic asthma/ COPD, chronic rhinosinusitis, Lung nodules complicated by hx AFib/ anticoagulation, CHF, cardiomyopathy, mitral insufficiency, GERD, CKD FOLLOWS FOR: Still on Xolair and doing well; no flare ups and no cough Says she is doing "great" now with no acute symptoms.  Review of Systems-see HPI Constitutional:    weight loss, night sweats, fevers, chills, +fatigue, lassitude. HEENT:   No-  headaches, difficulty swallowing, tooth/dental problems, sore throat,       No-  sneezing, itching, ear ache, +less nasal congestion, +post nasal drip,  CV:  +postherpetic R chest pain, no-orthopnea, PND, swelling in lower extremities, anasarca, dizziness,                       palpitations Resp: No- acute  shortness of breath with exertion or at rest.              No-   productive cough,  +non-productive cough,  No- coughing up of blood.              No-   change in color of mucus. No- wheezing.   Skin: No-   rash or lesions. GI:  No-   heartburn, indigestion, abdominal pain, nausea, vomiting,  GU: . MS:  No-   joint pain or swelling.  Neuro-     nothing unusual Psych:  No- change in mood or affect.  No depression or anxiety.  No memory loss.  Objective:   Physical Exam General- Alert, Oriented, Affect-appropriate/ cheerful, Distress- none acute, thin Skin- no rash visible now on right lateral ribs Lymphadenopathy- none Head- atraumatic            Eyes- Gross vision intact, PERRLA, conjunctivae clear secretions            Ears- Hearing, canals-normal            Nose- clear,  turbinate edema, no-Septal dev, mucus, polyps, erosion, perforation             Throat- Mallampati II , mucosa clear/ not red , drainage- none, tonsils- atrophic,                          dentures.+ Mild hoarseness .+ Large torus hard palate Neck- flexible , trachea midline, no stridor , thyroid nl, carotid no bruit Chest - symmetrical excursion , unlabored           Heart/CV- +IRR/ AFib well controlled , + faint ? Mitral regurg murmur , no gallop  ,                               no rub, nl s1 s2                           - JVD- none , edema- none, stasis changes- none, varices- none           Lung- clear, wheeze+wet coarse, cough- none , dullness-none, rub- none           Chest wall-  Abd-  Br/ Gen/ Rectal- Not done, not indicated Extrem- cyanosis- none, clubbing, none, atrophy- none, strength- nl Neuro- slight resting tremor

## 2014-01-31 NOTE — Patient Instructions (Addendum)
We can continue current treatment. I'm glad you are doing so well now.  Please call if we can help  We can consider the Prevnar pneumonia vaccine when you return in the fall

## 2014-02-07 ENCOUNTER — Encounter: Payer: Self-pay | Admitting: Internal Medicine

## 2014-02-08 ENCOUNTER — Encounter: Payer: Self-pay | Admitting: Internal Medicine

## 2014-02-08 ENCOUNTER — Telehealth: Payer: Self-pay | Admitting: *Deleted

## 2014-02-08 NOTE — Telephone Encounter (Signed)
Thanks for the FYI

## 2014-02-08 NOTE — Telephone Encounter (Signed)
LMOM x 1 with patient ATC x 1  Patient niece (EC)--no vmail set up Pt has appt 02/09/14 with Dr Annamaria Boots @9 :20.  Does not need appt until 05/2014. This is an old appt that was never cancelled.  Will send to Katie to make aware that I have attempted to contact pt to cancel appt.

## 2014-02-09 ENCOUNTER — Ambulatory Visit: Payer: Medicare Other | Admitting: Internal Medicine

## 2014-02-22 ENCOUNTER — Ambulatory Visit (INDEPENDENT_AMBULATORY_CARE_PROVIDER_SITE_OTHER): Payer: Medicare PPO | Admitting: *Deleted

## 2014-02-22 ENCOUNTER — Ambulatory Visit (INDEPENDENT_AMBULATORY_CARE_PROVIDER_SITE_OTHER): Payer: Medicare PPO

## 2014-02-22 DIAGNOSIS — I4891 Unspecified atrial fibrillation: Secondary | ICD-10-CM

## 2014-02-22 DIAGNOSIS — Z5181 Encounter for therapeutic drug level monitoring: Secondary | ICD-10-CM

## 2014-02-22 DIAGNOSIS — Z7901 Long term (current) use of anticoagulants: Secondary | ICD-10-CM

## 2014-02-22 DIAGNOSIS — J449 Chronic obstructive pulmonary disease, unspecified: Secondary | ICD-10-CM

## 2014-02-22 LAB — POCT INR: INR: 2.8

## 2014-02-23 MED ORDER — OMALIZUMAB 150 MG ~~LOC~~ SOLR
300.0000 mg | Freq: Once | SUBCUTANEOUS | Status: AC
Start: 2014-02-23 — End: 2014-02-23
  Administered 2014-02-23: 300 mg via SUBCUTANEOUS

## 2014-03-06 ENCOUNTER — Other Ambulatory Visit (HOSPITAL_COMMUNITY): Payer: Self-pay | Admitting: Family Medicine

## 2014-03-06 DIAGNOSIS — Z1231 Encounter for screening mammogram for malignant neoplasm of breast: Secondary | ICD-10-CM

## 2014-03-07 ENCOUNTER — Ambulatory Visit (HOSPITAL_COMMUNITY)
Admission: RE | Admit: 2014-03-07 | Discharge: 2014-03-07 | Disposition: A | Discharge status: Home / Self Care | Payer: Medicare PPO | Source: Ambulatory Visit | Attending: Family Medicine | Admitting: Family Medicine

## 2014-03-07 DIAGNOSIS — Z803 Family history of malignant neoplasm of breast: Secondary | ICD-10-CM | POA: Insufficient documentation

## 2014-03-07 DIAGNOSIS — Z1231 Encounter for screening mammogram for malignant neoplasm of breast: Secondary | ICD-10-CM | POA: Insufficient documentation

## 2014-03-14 ENCOUNTER — Telehealth: Payer: Self-pay | Admitting: Cardiology

## 2014-03-14 MED ORDER — DIGOXIN 125 MCG PO TABS: 0.1250 mg | ORAL_TABLET | Freq: Every day | ORAL | Status: DC

## 2014-03-14 MED ORDER — FUROSEMIDE 40 MG PO TABS: 40.0000 mg | ORAL_TABLET | Freq: Every day | ORAL | Status: DC

## 2014-03-14 MED ORDER — DILTIAZEM HCL ER COATED BEADS 240 MG PO CP24: 240.0000 mg | ORAL_CAPSULE | Freq: Every day | ORAL | Status: DC

## 2014-03-14 MED ORDER — WARFARIN SODIUM 5 MG PO TABS: 5.0000 mg | ORAL_TABLET | Freq: Once | ORAL | Status: DC

## 2014-03-14 NOTE — Telephone Encounter (Signed)
Please see refill bin / tgs  °

## 2014-03-14 NOTE — Telephone Encounter (Signed)
Refill request complete 

## 2014-03-16 ENCOUNTER — Other Ambulatory Visit: Payer: Self-pay | Admitting: *Deleted

## 2014-03-16 DIAGNOSIS — M47816 Spondylosis without myelopathy or radiculopathy, lumbar region: Secondary | ICD-10-CM

## 2014-03-16 MED ORDER — TRAMADOL-ACETAMINOPHEN 37.5-325 MG PO TABS: 1.0000 | ORAL_TABLET | ORAL | Status: DC | PRN

## 2014-03-19 NOTE — Assessment & Plan Note (Signed)
Good control with no recent exacerbation. She is not very active. Dyspnea is more likely to reflect her cardiac status and deconditioning.

## 2014-03-19 NOTE — Assessment & Plan Note (Signed)
Controlled ventricular response rate 

## 2014-03-19 NOTE — Assessment & Plan Note (Signed)
Well controlled 

## 2014-03-21 ENCOUNTER — Other Ambulatory Visit: Payer: Self-pay

## 2014-03-22 ENCOUNTER — Ambulatory Visit (INDEPENDENT_AMBULATORY_CARE_PROVIDER_SITE_OTHER): Payer: Medicare PPO | Admitting: *Deleted

## 2014-03-22 DIAGNOSIS — I482 Chronic atrial fibrillation, unspecified: Secondary | ICD-10-CM

## 2014-03-22 DIAGNOSIS — I4891 Unspecified atrial fibrillation: Secondary | ICD-10-CM

## 2014-03-22 DIAGNOSIS — Z5181 Encounter for therapeutic drug level monitoring: Secondary | ICD-10-CM

## 2014-03-22 DIAGNOSIS — Z7901 Long term (current) use of anticoagulants: Secondary | ICD-10-CM

## 2014-03-22 LAB — POCT INR: INR: 2.3

## 2014-03-24 ENCOUNTER — Ambulatory Visit (INDEPENDENT_AMBULATORY_CARE_PROVIDER_SITE_OTHER): Payer: Medicare PPO

## 2014-03-24 DIAGNOSIS — J449 Chronic obstructive pulmonary disease, unspecified: Secondary | ICD-10-CM

## 2014-03-27 MED ORDER — OMALIZUMAB 150 MG ~~LOC~~ SOLR
300.0000 mg | Freq: Once | SUBCUTANEOUS | Status: AC
  Administered 2014-03-27: 300 mg via SUBCUTANEOUS

## 2014-03-29 ENCOUNTER — Ambulatory Visit (INDEPENDENT_AMBULATORY_CARE_PROVIDER_SITE_OTHER): Payer: Medicare PPO | Admitting: *Deleted

## 2014-03-29 DIAGNOSIS — Z5181 Encounter for therapeutic drug level monitoring: Secondary | ICD-10-CM

## 2014-03-29 DIAGNOSIS — I4891 Unspecified atrial fibrillation: Secondary | ICD-10-CM

## 2014-03-29 DIAGNOSIS — Z7901 Long term (current) use of anticoagulants: Secondary | ICD-10-CM

## 2014-03-29 LAB — POCT INR: INR: 2.3

## 2014-03-30 ENCOUNTER — Ambulatory Visit (INDEPENDENT_AMBULATORY_CARE_PROVIDER_SITE_OTHER): Payer: Medicare PPO | Admitting: Internal Medicine

## 2014-03-30 ENCOUNTER — Encounter: Payer: Self-pay | Admitting: Internal Medicine

## 2014-03-30 VITALS — BP 120/76 | HR 93 | Ht 66.0 in | Wt 163.6 lb

## 2014-03-30 DIAGNOSIS — G4730 Sleep apnea, unspecified: Secondary | ICD-10-CM

## 2014-03-30 DIAGNOSIS — J0100 Acute maxillary sinusitis, unspecified: Secondary | ICD-10-CM

## 2014-03-30 DIAGNOSIS — G4710 Hypersomnia, unspecified: Secondary | ICD-10-CM

## 2014-03-30 DIAGNOSIS — J449 Chronic obstructive pulmonary disease, unspecified: Secondary | ICD-10-CM

## 2014-03-30 MED ORDER — PREDNISONE 10 MG PO TABS: ORAL_TABLET | ORAL | Status: DC

## 2014-03-30 NOTE — Progress Notes (Signed)
Patient ID: [name], female    DOB: 05-17-1934, 80 y.o.   MRN: [id number]  HPI 7/113/12-  76 yoF former smoker, followed for Chronic asthma/ COPD, chronic rhinosinusitis, complicated by hx AFib/ anticoagulation, CHF, cardiomyopathy, mitral insufficiency, GERD Last here December 11/19/10- note reviewed She has felt well controlled with no acute breathing issues. She saw Dr Domenic Polite for cardiac f/u 2 weeks ago and Echo was good with no med changes. Uses rescue inhaler only occasionally, usually on first rising. Little wheeze at times, much better. Little DOE with ADLs PFT 12/06/10= chronic obstructive asthma   7/113/12-  76 yoF former smoker, followed for Chronic asthma/ COPD, chronic rhinosinusitis, complicated by hx AFib/ anticoagulation, CHF, cardiomyopathy, mitral insufficiency, GERD Has had flu vaccine. Persistent head and chest congestion for the past 2 weeks. On November 7 we sent  Avelox for 5 days. This helped but didn't last long enough. She took a leftover prednisone taper but wants to avoid more prednisone now if possible. She has noted mild tremor in her hands for the past month no lateralizing finding. She continues Xolair injections. Using nebulizer or rescue inhaler only every 1 or 2 days. Has lost some weight and admits she has been dieting some but otherwise she's not sure that explains it. Thyroid function test normal in the past.  11/27/11- 76 yoF former smoker, followed for Chronic asthma/ COPD, chronic rhinosinusitis, complicated by hx AFib/ anticoagulation, CHF, cardiomyopathy, mitral insufficiency, GERD, Lung nodules Continues to do well. Continues Xolair. She thyroid nodule biopsy by interventional radiology. Waiting for results. Eyes have been watering some increased nasal drainage. We discussed possibility of seasonal allergic symptoms when she is getting Xolair. CT chest 10/20/2011-bilateral nodules less than 10 mm. Needs followup CT scan in May.  02/05/12- 76 yoF  former smoker, followed for Chronic asthma/ COPD, chronic rhinosinusitis, complicated by hx AFib/ anticoagulation, CHF, cardiomyopathy, mitral insufficiency, GERD, Lung nodules Denies any cough, congestion, SOB, or wheezing, Slight flare up with allergies at this time and still on Xolair. CAT score 8 She is pleased to be holding her current weight. Sometimes has a little wheeze but not much rarely needs a rescue inhaler and uses her nebulizer 2 or 3 times per week. Continues Xolair and believes it helps. Denies reflux or heartburn. CT chest  01/29/12- images reviewed with her. IMPRESSION:  1. Scattered bilateral pulmonary nodules are unchanged in size  from 10/22/2011. Additional follow-up CT chest without contrast  could be performed in 6 months to ensure continued stability, as  clinically indicated. This recommendation follows the consensus  statement: Guidelines for Management of Small Pulmonary Nodules  Detected on CT Scans: A Statement from the [place] as  published in Radiology 2005; 237:395-400. If multidisciplinary  follow up management is desired, this is available in the [facility] through the [facility] [phone]-  3200.  2. Dominant right thyroid nodule. Consider further evaluation with  thyroid ultrasound. If patient is clinically hyperthyroid,  consider nuclear medicine thyroid uptake and scan.  Original Report Authenticated By: Luretha Rued, M.D.    08/09/12- 25 yoF former smoker, followed for Chronic asthma/ COPD, chronic rhinosinusitis, complicated by hx AFib/ anticoagulation, CHF, cardiomyopathy, mitral insufficiency, GERD, Lung nodules FOLLOWS FOR: denies any SOB, wheezing or cough. States she is having chest/throat congestion. For 2 weeks has had "congestion" in throat and chest. Dr. Hilma Favors treated Levaquin and it one week ago. She denies purulent discharge, fever or wheeze. Echocardiogram 05/03/2012-further improved. EF  60-65% with mitral regurgitation and mitral valve prolapse. Chest refill Valium use occasionally. CT 08/04/12-reviewed with her IMPRESSION:  1. Stable bilateral pulmonary nodules. No new pulmonary nodules  are noted. A follow-up examination in 1 year is recommended to  assure stability.  2. Stable 2 cm low density nodule in the right lobe of the thyroid  gland.  3. No acute infiltrate or pulmonary edema. Small hiatal hernia  again noted.  Original Report Authenticated By: Lahoma Crocker, M.D.  02/07/13- 57 yoF former smoker, followed for Chronic asthma/ COPD, chronic rhinosinusitis, complicated by hx AFib/ anticoagulation, CHF, cardiomyopathy, mitral insufficiency, GERD, Lung nodules FOLLOWS FOR: still on Xolair; drainage in throat. Denies any flare ups with asthma or allergies at this time. Taking Zyrtec. Some postnasal drip. Had thyroid nodule biopsied.  08/11/13- 79 yoF former smoker, followed for Chronic asthma/ COPD, chronic rhinosinusitis, complicated by hx AFib/ anticoagulation, CHF, cardiomyopathy, mitral insufficiency, GERD, Lung nodules FOLLOWS FOR: has had shingles for aobut 4 weeks; still on Xolair and plans to get injection for December as she did not take November's injection. Shingles right lateral rib cage- valacyclovir Rx'd. Still hurts. It is not needing oxygen. CT 07/15/13  IMPRESSION:  1. No acute cardiopulmonary abnormalities.  2. Stable nodules from 10/22/2011. These are unchanged for almost 19  months and are likely benign.  3. Atherosclerotic disease.  4. Hiatal hernia status post fundoplication.  Electronically Signed  By: Kerby Moors M.D.  On: 07/14/2013 13:04  12/01/13- 79 yoF former smoker, followed for Chronic asthma/ COPD, chronic rhinosinusitis, complicated by hx AFib/ anticoagulation, CHF, cardiomyopathy, mitral insufficiency, GERD, Lung nodules ACUTE VISIT: stuffy head, coughing so hard (mainly at night) that she isnt sleeping. Productive at times-white in  color. Due for Xolair today however working with Insurance to approve. Ended prednisone 10 mg daily maintenance 3 weeks ago. Since then has felt more washed out and tired. Had lab work indicating no heart failure, but kidney function poor> Nephrology. Increased nasal congestion. Cough keeps her awake. Some wheeze. Using nebulizer 3 or 4 times daily, Xolair on schedule.  01/31/14- 79 yoF former smoker, followed for Chronic asthma/ COPD, chronic rhinosinusitis, Lung nodules complicated by hx AFib/ anticoagulation, CHF, cardiomyopathy, mitral insufficiency, GERD, CKD FOLLOWS FOR: Still on Xolair and doing well; no flare ups and no cough Says she is doing "great" now with no acute symptoms.  03/30/14-  79 yoF former smoker, followed for Chronic asthma/ COPD, chronic rhinosinusitis, Lung nodules complicated by hx AFib/ anticoagulation, CHF, cardiomyopathy, mitral insufficiency, GERD, CKD ACUTE VISIT: increased cough; SOB and wheezing. Had her Xolair injection on Friday 03-24-14 Recently on Avelox for 7 days for sinus infection from  PCP office as well as short dose of Pred . Does not feel sick. Sinusitis resolved. No headache. When she coughs her nose runs copious white clear mucus.  Review of Systems-see HPI Constitutional:    weight loss, night sweats, fevers, chills, +fatigue, lassitude. HEENT:   No-  headaches, difficulty swallowing, tooth/dental problems, sore throat,       No-  sneezing, itching, ear ache, no- nasal congestion, +post nasal drip,  CV:  No- chest pain, no-orthopnea, PND, swelling in lower extremities, anasarca, dizziness,                       palpitations Resp: + acute  shortness of breath with exertion or at rest.              No-   productive cough,  +  non-productive cough,  No- coughing up of blood.              No-   change in color of mucus. + wheezing.   Skin: No-   rash or lesions. GI:  No-   heartburn, indigestion, abdominal pain, nausea, vomiting,  GU: . MS:  No-   joint  pain or swelling.  Neuro-     nothing unusual Psych:  No- change in mood or affect. No depression or anxiety.  No memory loss.  Objective:   Physical Exam General- Alert, Oriented, Affect-appropriate/ cheerful, Distress- none acute, thin Skin- no rash visible now on right lateral ribs Lymphadenopathy- none Head- atraumatic            Eyes- Gross vision intact, PERRLA, conjunctivae clear secretions            Ears- Hearing, canals-normal            Nose- clear, turbinate edema, no-Septal dev, mucus, polyps, erosion, perforation             Throat- Mallampati II , mucosa clear/ not red , drainage- none, tonsils- atrophic,                          dentures.+ Mild hoarseness .+ Large torus hard palate Neck- flexible , trachea midline, no stridor , thyroid nl, carotid no bruit Chest - symmetrical excursion , unlabored           Heart/CV- +IRR/ AFib well controlled , + faint ? Mitral regurg murmur , no gallop  ,                               no rub, nl s1 s2                           - JVD- none , edema- none, stasis changes- none, varices- none           Lung- clear, wheeze-none,  Cough+ coarse rattle , dullness-none, rub- none           Chest wall-  Abd-  Br/ Gen/ Rectal- Not done, not indicated Extrem- cyanosis- none, clubbing, none, atrophy- none, strength- nl Neuro- slight resting tremor

## 2014-03-30 NOTE — Patient Instructions (Addendum)
Script sent for prednisone taper  Please call if this doesn't take care of you

## 2014-03-31 ENCOUNTER — Telehealth: Payer: Self-pay | Admitting: *Deleted

## 2014-03-31 NOTE — Telephone Encounter (Signed)
PT WAS STARTED PREDNISONE 10 MG YESTERDAY FOR 8 DAYS

## 2014-03-31 NOTE — Telephone Encounter (Signed)
Pt started on Prednisone taper yesterday for bronchitis.  Taking 40mg  x 2 days, 30mg  x 2 days, 10mg  x 2 days 5mg  x 2 days.  Told pt to take coumadin 1/2 tablet daily till next INR appt on Wed 7/29.  Pt verbalized understanding.

## 2014-04-02 DIAGNOSIS — J0100 Acute maxillary sinusitis, unspecified: Secondary | ICD-10-CM | POA: Insufficient documentation

## 2014-04-02 NOTE — Assessment & Plan Note (Signed)
Remains comfortable off of CPAP after weight loss

## 2014-04-02 NOTE — Assessment & Plan Note (Signed)
Acute exacerbation now resolving after Avelox. Residual mucus drainage should resolve.

## 2014-04-02 NOTE — Assessment & Plan Note (Signed)
Acute exacerbation, nonspecific but possibly viral. Antibiotics not needed now. Plan-hold prescription for prednisone taper

## 2014-04-05 ENCOUNTER — Ambulatory Visit (INDEPENDENT_AMBULATORY_CARE_PROVIDER_SITE_OTHER): Payer: Medicare PPO | Admitting: *Deleted

## 2014-04-05 DIAGNOSIS — Z5181 Encounter for therapeutic drug level monitoring: Secondary | ICD-10-CM

## 2014-04-05 DIAGNOSIS — Z7901 Long term (current) use of anticoagulants: Secondary | ICD-10-CM

## 2014-04-05 DIAGNOSIS — I4891 Unspecified atrial fibrillation: Secondary | ICD-10-CM

## 2014-04-05 LAB — POCT INR: INR: 2.2

## 2014-04-13 ENCOUNTER — Telehealth: Payer: Self-pay | Admitting: Internal Medicine

## 2014-04-13 NOTE — Telephone Encounter (Signed)
lmtcb for [employer] with [employer]

## 2014-04-14 NOTE — Telephone Encounter (Signed)
LM with Stacia w/[employer] to return call.

## 2014-04-14 NOTE — Telephone Encounter (Signed)
Gasper Sells w/ [employer] returned call & can be reached at [phone], Option 2, ext. [phone].  [name]

## 2014-04-14 NOTE — Telephone Encounter (Signed)
[employer] and spoke with [place].  Per Gasper Sells, pt is new to their coverage and her Nexium BID requires diagnosis code and PA.  Per Gasper Sells, their records show that Dr Henrene Pastor performed the endoscopy that diagnosed pt with Barrett's Esophagus but pt reports that [facility] placed the initial referral to have this procedure done.  Gasper Sells has tried to contact Dr Blanch Media office but has yet to receive call back (she initiated the call to that office on 7.7.15).  Hx of Barrett's Esophagus is on pt's problem list from 2009 at V12.79.  I have researched pt's chart and see no notes from Dr Henrene Pastor and her med list states Nexium 40mg  BID all the way back to 2008.    Gasper Sells is asking if CY will do the PA for the BID Nexium 40mg  and provide diagnosis code. [employer] Coverage Review Dept : [phone] Patient ID # : [id number] Ref # : [id number]   Gasper Sells is aware CY is out of the office this week, and does request update next week (she will be out of the office 8/12-8/14 but can leave message).  Dr Annamaria Boots please advise if you would mind authorizing this PA, thank you.

## 2014-04-14 NOTE — Telephone Encounter (Signed)
stacia returned call [phone] option 2 [phone]

## 2014-04-14 NOTE — Telephone Encounter (Signed)
[name] w/ [employer]  returned call.  [name]

## 2014-04-14 NOTE — Telephone Encounter (Signed)
lmtcb for [employer] with [employer]

## 2014-04-17 NOTE — Telephone Encounter (Signed)
Called [phone]-spoke with [employer] to start PA. Was advised PA will go for clinical review. Turn around time is 24-72 hrs Will forward to [place].

## 2014-04-17 NOTE — Telephone Encounter (Signed)
Ok to proceed with PA Nexium BID for GERD with Barretts Esophagitis as requested

## 2014-04-18 NOTE — Telephone Encounter (Signed)
Pt is asking for an update on this from [place].  Pt can be reached at [phone].  [name]

## 2014-04-18 NOTE — Telephone Encounter (Signed)
Called made pt aware we are awaiting approval/denial. Will forward to [facility]

## 2014-04-19 ENCOUNTER — Ambulatory Visit (INDEPENDENT_AMBULATORY_CARE_PROVIDER_SITE_OTHER): Payer: Medicare PPO | Admitting: *Deleted

## 2014-04-19 DIAGNOSIS — Z5181 Encounter for therapeutic drug level monitoring: Secondary | ICD-10-CM

## 2014-04-19 DIAGNOSIS — Z7901 Long term (current) use of anticoagulants: Secondary | ICD-10-CM

## 2014-04-19 DIAGNOSIS — I4891 Unspecified atrial fibrillation: Secondary | ICD-10-CM

## 2014-04-19 DIAGNOSIS — I480 Paroxysmal atrial fibrillation: Secondary | ICD-10-CM

## 2014-04-19 LAB — POCT INR: INR: 5.2

## 2014-04-21 NOTE — Telephone Encounter (Signed)
PA was approved via phone call to insurance company. Spoke with rep at insurance and they state that the approval is from 04-17-14 through 04-17-15. Approval number is [id number].   I have attempted to call to inform her. Will place in Triage to assist in calling patient again later.

## 2014-04-24 ENCOUNTER — Ambulatory Visit (INDEPENDENT_AMBULATORY_CARE_PROVIDER_SITE_OTHER): Payer: Medicare PPO | Admitting: *Deleted

## 2014-04-24 ENCOUNTER — Ambulatory Visit (INDEPENDENT_AMBULATORY_CARE_PROVIDER_SITE_OTHER): Payer: Medicare PPO

## 2014-04-24 DIAGNOSIS — J449 Chronic obstructive pulmonary disease, unspecified: Secondary | ICD-10-CM

## 2014-04-24 DIAGNOSIS — Z7901 Long term (current) use of anticoagulants: Secondary | ICD-10-CM

## 2014-04-24 DIAGNOSIS — I4891 Unspecified atrial fibrillation: Secondary | ICD-10-CM

## 2014-04-24 DIAGNOSIS — Z5181 Encounter for therapeutic drug level monitoring: Secondary | ICD-10-CM

## 2014-04-24 LAB — POCT INR: INR: 2.5

## 2014-04-24 NOTE — Telephone Encounter (Signed)
Patient called back, please return call

## 2014-04-24 NOTE — Telephone Encounter (Signed)
ATC PT fast busy signal x 4 wcb

## 2014-04-24 NOTE — Telephone Encounter (Signed)
lmomtcb x 2  

## 2014-04-25 MED ORDER — OMALIZUMAB 150 MG ~~LOC~~ SOLR
300.0000 mg | Freq: Once | SUBCUTANEOUS | Status: AC
  Administered 2014-04-25: 300 mg via SUBCUTANEOUS

## 2014-04-25 NOTE — Telephone Encounter (Signed)
Pt aware. Nothing further needed 

## 2014-04-25 NOTE — Telephone Encounter (Signed)
lmomtcb x1 

## 2014-04-25 NOTE — Telephone Encounter (Signed)
Pt returning call.[name] ° °

## 2014-05-08 ENCOUNTER — Ambulatory Visit (INDEPENDENT_AMBULATORY_CARE_PROVIDER_SITE_OTHER): Payer: Medicare PPO | Admitting: *Deleted

## 2014-05-08 DIAGNOSIS — I4891 Unspecified atrial fibrillation: Secondary | ICD-10-CM

## 2014-05-08 DIAGNOSIS — Z7901 Long term (current) use of anticoagulants: Secondary | ICD-10-CM

## 2014-05-08 DIAGNOSIS — Z5181 Encounter for therapeutic drug level monitoring: Secondary | ICD-10-CM

## 2014-05-08 LAB — POCT INR: INR: 2.8

## 2014-05-09 ENCOUNTER — Other Ambulatory Visit: Payer: Self-pay | Admitting: *Deleted

## 2014-05-09 DIAGNOSIS — M47816 Spondylosis without myelopathy or radiculopathy, lumbar region: Secondary | ICD-10-CM

## 2014-05-09 MED ORDER — TRAMADOL-ACETAMINOPHEN 37.5-325 MG PO TABS: 1.0000 | ORAL_TABLET | ORAL | Status: DC | PRN

## 2014-05-22 ENCOUNTER — Ambulatory Visit (INDEPENDENT_AMBULATORY_CARE_PROVIDER_SITE_OTHER): Payer: Medicare PPO

## 2014-05-22 DIAGNOSIS — J449 Chronic obstructive pulmonary disease, unspecified: Secondary | ICD-10-CM

## 2014-05-25 MED ORDER — OMALIZUMAB 150 MG ~~LOC~~ SOLR
300.0000 mg | Freq: Once | SUBCUTANEOUS | Status: AC
  Administered 2014-05-25: 300 mg via SUBCUTANEOUS

## 2014-05-29 ENCOUNTER — Ambulatory Visit (INDEPENDENT_AMBULATORY_CARE_PROVIDER_SITE_OTHER): Payer: Medicare PPO | Admitting: *Deleted

## 2014-05-29 DIAGNOSIS — Z7901 Long term (current) use of anticoagulants: Secondary | ICD-10-CM

## 2014-05-29 DIAGNOSIS — Z5181 Encounter for therapeutic drug level monitoring: Secondary | ICD-10-CM

## 2014-05-29 DIAGNOSIS — I4891 Unspecified atrial fibrillation: Secondary | ICD-10-CM

## 2014-05-29 LAB — POCT INR: INR: 1.8

## 2014-06-05 ENCOUNTER — Encounter: Payer: Self-pay | Admitting: Internal Medicine

## 2014-06-05 ENCOUNTER — Ambulatory Visit (INDEPENDENT_AMBULATORY_CARE_PROVIDER_SITE_OTHER): Payer: Medicare PPO | Admitting: Internal Medicine

## 2014-06-05 VITALS — BP 120/74 | HR 62 | Ht 66.0 in | Wt 169.8 lb

## 2014-06-05 DIAGNOSIS — J449 Chronic obstructive pulmonary disease, unspecified: Secondary | ICD-10-CM

## 2014-06-05 DIAGNOSIS — I059 Rheumatic mitral valve disease, unspecified: Secondary | ICD-10-CM

## 2014-06-05 DIAGNOSIS — Z23 Encounter for immunization: Secondary | ICD-10-CM

## 2014-06-05 MED ORDER — ALBUTEROL SULFATE (2.5 MG/3ML) 0.083% IN NEBU: 2.5000 mg | INHALATION_SOLUTION | RESPIRATORY_TRACT | Status: DC | PRN

## 2014-06-05 NOTE — Assessment & Plan Note (Signed)
Followed by cardiology 

## 2014-06-05 NOTE — Progress Notes (Signed)
Patient ID: [name], female    DOB: [birth year], 80 y.o.   MRN: [id number]  HPI 7/113/12-  76 yoF former smoker, followed for Chronic asthma/ COPD, chronic rhinosinusitis, complicated by hx AFib/ anticoagulation, CHF, cardiomyopathy, mitral insufficiency, GERD Last here December 11/19/10- note reviewed She has felt well controlled with no acute breathing issues. She saw Dr Domenic Polite for cardiac f/u 2 weeks ago and Echo was good with no med changes. Uses rescue inhaler only occasionally, usually on first rising. Little wheeze at times, much better. Little DOE with ADLs PFT 12/06/10= chronic obstructive asthma   7/113/12-  76 yoF former smoker, followed for Chronic asthma/ COPD, chronic rhinosinusitis, complicated by hx AFib/ anticoagulation, CHF, cardiomyopathy, mitral insufficiency, GERD Has had flu vaccine. Persistent head and chest congestion for the past 2 weeks. On November 7 we sent  Avelox for 5 days. This helped but didn't last long enough. She took a leftover prednisone taper but wants to avoid more prednisone now if possible. She has noted mild tremor in her hands for the past month no lateralizing finding. She continues Xolair injections. Using nebulizer or rescue inhaler only every 1 or 2 days. Has lost some weight and admits she has been dieting some but otherwise she's not sure that explains it. Thyroid function test normal in the past.  11/27/11- 76 yoF former smoker, followed for Chronic asthma/ COPD, chronic rhinosinusitis, complicated by hx AFib/ anticoagulation, CHF, cardiomyopathy, mitral insufficiency, GERD, Lung nodules Continues to do well. Continues Xolair. She thyroid nodule biopsy by interventional radiology. Waiting for results. Eyes have been watering some increased nasal drainage. We discussed possibility of seasonal allergic symptoms when she is getting Xolair. CT chest 10/20/2011-bilateral nodules less than 10 mm. Needs followup CT scan in May.  02/05/12- 76 yoF  former smoker, followed for Chronic asthma/ COPD, chronic rhinosinusitis, complicated by hx AFib/ anticoagulation, CHF, cardiomyopathy, mitral insufficiency, GERD, Lung nodules Denies any cough, congestion, SOB, or wheezing, Slight flare up with allergies at this time and still on Xolair. CAT score 8 She is pleased to be holding her current weight. Sometimes has a little wheeze but not much rarely needs a rescue inhaler and uses her nebulizer 2 or 3 times per week. Continues Xolair and believes it helps. Denies reflux or heartburn. CT chest  01/29/12- images reviewed with her. IMPRESSION:  1. Scattered bilateral pulmonary nodules are unchanged in size  from 10/22/2011. Additional follow-up CT chest without contrast  could be performed in 6 months to ensure continued stability, as  clinically indicated. This recommendation follows the consensus  statement: Guidelines for Management of Small Pulmonary Nodules  Detected on CT Scans: A Statement from the [employer] as  published in Radiology 2005; 237:395-400. If multidisciplinary  follow up management is desired, this is available in the [facility] through the [facility] [phone].  2. Dominant right thyroid nodule. Consider further evaluation with  thyroid ultrasound. If patient is clinically hyperthyroid,  consider nuclear medicine thyroid uptake and scan.  Original Report Authenticated By: Luretha Rued, M.D.    08/09/12- 67 yoF former smoker, followed for Chronic asthma/ COPD, chronic rhinosinusitis, complicated by hx AFib/ anticoagulation, CHF, cardiomyopathy, mitral insufficiency, GERD, Lung nodules FOLLOWS FOR: denies any SOB, wheezing or cough. States she is having chest/throat congestion. For 2 weeks has had "congestion" in throat and chest. Dr. Hilma Favors treated Levaquin and it one week ago. She denies purulent discharge, fever or wheeze. Echocardiogram 05/03/2012-further improved. EF  60-65% with mitral regurgitation and mitral valve prolapse. Chest refill Valium use occasionally. CT 08/04/12-reviewed with her IMPRESSION:  1. Stable bilateral pulmonary nodules. No new pulmonary nodules  are noted. A follow-up examination in 1 year is recommended to  assure stability.  2. Stable 2 cm low density nodule in the right lobe of the thyroid  gland.  3. No acute infiltrate or pulmonary edema. Small hiatal hernia  again noted.  Original Report Authenticated By: Lahoma Crocker, M.D.  02/07/13- 35 yoF former smoker, followed for Chronic asthma/ COPD, chronic rhinosinusitis, complicated by hx AFib/ anticoagulation, CHF, cardiomyopathy, mitral insufficiency, GERD, Lung nodules FOLLOWS FOR: still on Xolair; drainage in throat. Denies any flare ups with asthma or allergies at this time. Taking Zyrtec. Some postnasal drip. Had thyroid nodule biopsied.  08/11/13- 79 yoF former smoker, followed for Chronic asthma/ COPD, chronic rhinosinusitis, complicated by hx AFib/ anticoagulation, CHF, cardiomyopathy, mitral insufficiency, GERD, Lung nodules FOLLOWS FOR: has had shingles for aobut 4 weeks; still on Xolair and plans to get injection for December as she did not take November's injection. Shingles right lateral rib cage- valacyclovir Rx'd. Still hurts. It is not needing oxygen. CT 07/15/13  IMPRESSION:  1. No acute cardiopulmonary abnormalities.  2. Stable nodules from 10/22/2011. These are unchanged for almost 19  months and are likely benign.  3. Atherosclerotic disease.  4. Hiatal hernia status post fundoplication.  Electronically Signed  By: Kerby Moors M.D.  On: 07/14/2013 13:04  12/01/13- 79 yoF former smoker, followed for Chronic asthma/ COPD, chronic rhinosinusitis, complicated by hx AFib/ anticoagulation, CHF, cardiomyopathy, mitral insufficiency, GERD, Lung nodules ACUTE VISIT: stuffy head, coughing so hard (mainly at night) that she isnt sleeping. Productive at times-white in  color. Due for Xolair today however working with Insurance to approve. Ended prednisone 10 mg daily maintenance 3 weeks ago. Since then has felt more washed out and tired. Had lab work indicating no heart failure, but kidney function poor> Nephrology. Increased nasal congestion. Cough keeps her awake. Some wheeze. Using nebulizer 3 or 4 times daily, Xolair on schedule.  01/31/14- 79 yoF former smoker, followed for Chronic asthma/ COPD, chronic rhinosinusitis, Lung nodules complicated by hx AFib/ anticoagulation, CHF, cardiomyopathy, mitral insufficiency, GERD, CKD FOLLOWS FOR: Still on Xolair and doing well; no flare ups and no cough Says she is doing "great" now with no acute symptoms.  03/30/14-  79 yoF former smoker, followed for Chronic asthma/ COPD, chronic rhinosinusitis, Lung nodules complicated by hx AFib/ anticoagulation, CHF, cardiomyopathy, mitral insufficiency, GERD, CKD ACUTE VISIT: increased cough; SOB and wheezing. Had her Xolair injection on Friday 03-24-14 Recently on Avelox for 7 days for sinus infection from  PCP office as well as short dose of Pred . Does not feel sick. Sinusitis resolved. No headache. When she coughs her nose runs copious white clear mucus.  06/05/14-  80 yoF former smoker, followed for Chronic asthma/ COPD, chronic rhinosinusitis, Lung nodules complicated by hx AFib/ anticoagulation, CHF, cardiomyopathy, mitral insufficiency, GERD, CKD FOLLOWS FOR: still on Xolair and doing well on it. Flu shot today as well. Albuterol neb needs ot go through [facility] from now on. Minor allergy symptoms this summer with watery eyes, but nothing bad. Feels very well. Admits on questioning that she does notice a little wheeze. Uses nebulizer machine occasionally.  Review of Systems-see HPI Constitutional:    weight loss, night sweats, fevers, chills, +fatigue, lassitude. HEENT:   No-  headaches, difficulty swallowing, tooth/dental problems, sore throat,  No-   sneezing, itching, ear ache, no- nasal congestion, +post nasal drip,  CV:  No- chest pain, no-orthopnea, PND, swelling in lower extremities, anasarca, dizziness,                       palpitations Resp: + acute  shortness of breath with exertion or at rest.              No-   productive cough,  +non-productive cough,  No- coughing up of blood.              No-   change in color of mucus. + wheezing.   Skin: No-   rash or lesions. GI:  No-   heartburn, indigestion, abdominal pain, nausea, vomiting,  GU: . MS:  No-   joint pain or swelling.  Neuro-     nothing unusual Psych:  No- change in mood or affect. No depression or anxiety.  No memory loss.  Objective:   Physical Exam General- Alert, Oriented, Affect-appropriate/ cheerful, Distress- none acute, thin Skin- no rash visible now on right lateral ribs Lymphadenopathy- none Head- atraumatic            Eyes- Gross vision intact, PERRLA, conjunctivae clear secretions            Ears- Hearing, canals-normal            Nose- clear, turbinate edema, no-Septal dev, mucus, polyps, erosion, perforation             Throat- Mallampati II , mucosa clear/ not red , drainage- none, tonsils- atrophic,                          dentures.+ Mild hoarseness .+ Large torus hard palate Neck- flexible , trachea midline, no stridor , thyroid nl, carotid no bruit Chest - symmetrical excursion , unlabored           Heart/CV- +IRR/ AFib well controlled , + faint ? Mitral regurg murmur , no gallop  ,                               no rub, nl s1 s2                           - JVD- none , edema- none, stasis changes- none, varices- none           Lung-  wheeze+I&E/unlabored,  Cough+light , dullness-none, rub- none           Chest wall-  Abd-  Br/ Gen/ Rectal- Not done, not indicated Extrem- cyanosis- none, clubbing, none, atrophy- none, strength- nl Neuro- slight resting tremor

## 2014-06-05 NOTE — Patient Instructions (Signed)
Script for albuterol nebulizer solution sent to [facility]

## 2014-06-05 NOTE — Assessment & Plan Note (Addendum)
Plan-refill albuterol nebulizer solution through mail order pharmacy with discussion, flu vaccine, continue Xolair

## 2014-06-19 ENCOUNTER — Ambulatory Visit: Payer: Medicare PPO

## 2014-06-19 ENCOUNTER — Ambulatory Visit (INDEPENDENT_AMBULATORY_CARE_PROVIDER_SITE_OTHER): Payer: Medicare PPO | Admitting: *Deleted

## 2014-06-19 DIAGNOSIS — I4891 Unspecified atrial fibrillation: Secondary | ICD-10-CM

## 2014-06-19 DIAGNOSIS — Z7901 Long term (current) use of anticoagulants: Secondary | ICD-10-CM

## 2014-06-19 DIAGNOSIS — Z5181 Encounter for therapeutic drug level monitoring: Secondary | ICD-10-CM

## 2014-06-19 LAB — POCT INR: INR: 1.7

## 2014-06-21 ENCOUNTER — Ambulatory Visit (INDEPENDENT_AMBULATORY_CARE_PROVIDER_SITE_OTHER): Payer: Medicare PPO

## 2014-06-21 DIAGNOSIS — J449 Chronic obstructive pulmonary disease, unspecified: Secondary | ICD-10-CM

## 2014-06-21 DIAGNOSIS — J4540 Moderate persistent asthma, uncomplicated: Secondary | ICD-10-CM | POA: Diagnosis not present

## 2014-06-28 MED ORDER — OMALIZUMAB 150 MG ~~LOC~~ SOLR
300.0000 mg | Freq: Once | SUBCUTANEOUS | Status: AC
  Administered 2014-06-28: 300 mg via SUBCUTANEOUS

## 2014-06-29 ENCOUNTER — Telehealth: Payer: Self-pay | Admitting: Internal Medicine

## 2014-06-29 ENCOUNTER — Other Ambulatory Visit: Payer: Self-pay | Admitting: Internal Medicine

## 2014-06-29 MED ORDER — PREDNISONE 10 MG PO TABS: ORAL_TABLET | ORAL | Status: DC

## 2014-06-29 NOTE — Telephone Encounter (Signed)
Spoke with pt's niece.  Pt c/o wheezing, some SOB, prod cough (clear), sinus congestion.  Denies fever.  Please advise.  Allergies  Allergen Reactions  . Amoxicillin-Pot Clavulanate   . Clarithromycin   . Clindamycin   . Doxycycline   . Penicillins     Current Outpatient Prescriptions on File Prior to Visit  Medication Sig Dispense Refill  . albuterol (PROVENTIL) (2.5 MG/3ML) 0.083% nebulizer solution Take 3 mLs (2.5 mg total) by nebulization every 4 (four) hours as needed for wheezing or shortness of breath.  360 mL  3  . atorvastatin (LIPITOR) 10 MG tablet Take 10 mg by mouth daily.        . Calcium Carbonate-Vitamin D (CALTRATE 600+D) 600-400 MG-UNIT per tablet Take 1 tablet by mouth daily.        . cetirizine (ZYRTEC) 10 MG tablet Take 10 mg by mouth daily.      . citalopram (CELEXA) 20 MG tablet Take 20 mg by mouth daily.       . diazepam (VALIUM) 5 MG tablet Take 5 mg by mouth every 6 (six) hours as needed for anxiety.      . digoxin (DIGOX) 0.125 MG tablet Take 1 tablet (0.125 mg total) by mouth daily.  90 tablet  3  . diltiazem (CARDIZEM CD) 240 MG 24 hr capsule Take 1 capsule (240 mg total) by mouth daily.  90 capsule  3  . EPINEPHrine (EPI-PEN) 0.3 MG/0.3ML DEVI Inject 0.3 mg into the muscle as needed.        Marland Kitchen esomeprazole (NEXIUM) 40 MG capsule Take 40 mg by mouth 2 (two) times daily.        . furosemide (LASIX) 40 MG tablet Take 1 tablet (40 mg total) by mouth daily.  90 tablet  3  . mometasone (NASONEX) 50 MCG/ACT nasal spray Place 2 sprays into the nose as needed.       Marland Kitchen omalizumab (XOLAIR) 150 MG injection Inject 300 mg into the skin every 28 (twenty-eight) days.  2 vial  2  . potassium chloride SA (KLOR-CON M20) 20 MEQ tablet Take 1 tablet (20 mEq total) by mouth daily.  30 tablet  6  . PROAIR HFA 108 (90 BASE) MCG/ACT inhaler INHALE 1-2 PUFFS EVERY 4 TO 6 HOURS AS NEEDED FOR WHEEZING  8.5 each  1  . traMADol-acetaminophen (ULTRACET) 37.5-325 MG per tablet Take 1 tablet  by mouth every 4 (four) hours as needed.  90 tablet  5  . warfarin (COUMADIN) 5 MG tablet Take 1 tablet (5 mg total) by mouth one time only at 6 PM.  90 tablet  3   No current facility-administered medications on file prior to visit.

## 2014-06-29 NOTE — Telephone Encounter (Signed)
Spoke with pt's niece and advised that rx for Prednisone was sent to pharmacy.

## 2014-06-29 NOTE — Telephone Encounter (Signed)
Offer prednisone 10 mg, # 20, 4 X 2 DAYS, 3 X 2 DAYS, 2 X 2 DAYS, 1 X 2 DAYS  

## 2014-07-05 ENCOUNTER — Encounter: Payer: Self-pay | Admitting: Cardiology

## 2014-07-05 ENCOUNTER — Ambulatory Visit (INDEPENDENT_AMBULATORY_CARE_PROVIDER_SITE_OTHER): Payer: Medicare PPO | Admitting: Cardiology

## 2014-07-05 ENCOUNTER — Ambulatory Visit (INDEPENDENT_AMBULATORY_CARE_PROVIDER_SITE_OTHER): Payer: Medicare PPO | Admitting: *Deleted

## 2014-07-05 VITALS — BP 102/60 | HR 92 | Ht 66.0 in | Wt 170.0 lb

## 2014-07-05 DIAGNOSIS — I482 Chronic atrial fibrillation, unspecified: Secondary | ICD-10-CM

## 2014-07-05 DIAGNOSIS — I4891 Unspecified atrial fibrillation: Secondary | ICD-10-CM

## 2014-07-05 DIAGNOSIS — Z7901 Long term (current) use of anticoagulants: Secondary | ICD-10-CM

## 2014-07-05 DIAGNOSIS — Z5181 Encounter for therapeutic drug level monitoring: Secondary | ICD-10-CM

## 2014-07-05 DIAGNOSIS — I340 Nonrheumatic mitral (valve) insufficiency: Secondary | ICD-10-CM

## 2014-07-05 DIAGNOSIS — I429 Cardiomyopathy, unspecified: Secondary | ICD-10-CM

## 2014-07-05 LAB — POCT INR: INR: 1.3

## 2014-07-05 NOTE — Assessment & Plan Note (Signed)
Associated with bileaflet mitral prolapse, moderate by echocardiogram in September of last year. Follow-up study will be obtained.

## 2014-07-05 NOTE — Assessment & Plan Note (Signed)
Continue Coumadin, Cardizem CD, and digoxin.

## 2014-07-05 NOTE — Assessment & Plan Note (Signed)
Moderate bileaflet prolapse, anterior greater than posterior.

## 2014-07-05 NOTE — Assessment & Plan Note (Signed)
LVEF 55-60% as of last echocardiogram in September 2014.

## 2014-07-05 NOTE — Progress Notes (Signed)
Reason for visit: Atrial fibrillation, cardiomyopathy, mitral valve disease  Clinical Summary Ms. Bluth is a 80 y.o.female last seen in April. Interval follow-up with Dr. Annamaria Boots noted. She has had recent trouble with chest congestion, some wheezing and cough, just now completing a steroid taper. She denies any fevers or chills.  She has not been aware of any progressive sense of palpitations or chest pain. She reports compliance with her medications, and no bleeding problems on Coumadin. She continues on Cardizem CD and digoxin for heart rate control.  Followup echocardiogram in September 2014 showed mild LVH with LVEF 55-60%, indeterminate diastolic function, mitral prolapse with moderate posteriorly directed mitral regurgitation, moderate to severe left atrial enlargement, mild RV and RA enlargement.    Allergies  Allergen Reactions  . Amoxicillin-Pot Clavulanate   . Clarithromycin   . Clindamycin   . Doxycycline   . Penicillins     Current Outpatient Prescriptions  Medication Sig Dispense Refill  . albuterol (PROVENTIL) (2.5 MG/3ML) 0.083% nebulizer solution Take 3 mLs (2.5 mg total) by nebulization every 4 (four) hours as needed for wheezing or shortness of breath.  360 mL  3  . atorvastatin (LIPITOR) 10 MG tablet Take 10 mg by mouth daily.        . Calcium Carbonate-Vitamin D (CALTRATE 600+D) 600-400 MG-UNIT per tablet Take 1 tablet by mouth daily.        . cetirizine (ZYRTEC) 10 MG tablet Take 10 mg by mouth daily.      . citalopram (CELEXA) 20 MG tablet Take 20 mg by mouth daily.       . diazepam (VALIUM) 5 MG tablet Take 5 mg by mouth every 6 (six) hours as needed for anxiety.      . digoxin (DIGOX) 0.125 MG tablet Take 1 tablet (0.125 mg total) by mouth daily.  90 tablet  3  . diltiazem (CARDIZEM CD) 240 MG 24 hr capsule Take 1 capsule (240 mg total) by mouth daily.  90 capsule  3  . EPINEPHrine (EPI-PEN) 0.3 MG/0.3ML DEVI Inject 0.3 mg into the muscle as needed.        Marland Kitchen  esomeprazole (NEXIUM) 40 MG capsule Take 40 mg by mouth 2 (two) times daily.        . furosemide (LASIX) 40 MG tablet Take 1 tablet (40 mg total) by mouth daily.  90 tablet  3  . mometasone (NASONEX) 50 MCG/ACT nasal spray Place 2 sprays into the nose as needed.       Marland Kitchen omalizumab (XOLAIR) 150 MG injection Inject 300 mg into the skin every 28 (twenty-eight) days.  2 vial  2  . potassium chloride SA (KLOR-CON M20) 20 MEQ tablet Take 1 tablet (20 mEq total) by mouth daily.  30 tablet  6  . predniSONE (DELTASONE) 10 MG tablet Take 4 x 2 days, 3 x 2 days, 2 x 2 days, 1 x 2 days then stop.  20 tablet  0  . PROAIR HFA 108 (90 BASE) MCG/ACT inhaler INHALE 1 TO 2 PUFFS EVERY 4 TO 6 HOURS AS NEEDED FOR WHEEZING  8.5 each  1  . traMADol-acetaminophen (ULTRACET) 37.5-325 MG per tablet Take 1 tablet by mouth every 4 (four) hours as needed.  90 tablet  5  . warfarin (COUMADIN) 5 MG tablet Take 1 tablet (5 mg total) by mouth one time only at 6 PM.  90 tablet  3   No current facility-administered medications for this visit.    Past Medical History  Diagnosis Date  . Asthma   . Atrial fibrillation   . COPD (chronic obstructive pulmonary disease)   . GERD (gastroesophageal reflux disease)   . Mitral valve prolapse   . Nonischemic cardiomyopathy     LVEF 25-30% improved to 45-50%  . Chronic rhinitis   . Obstructive sleep apnea   . Transudative pleural effusion   . Nasal polyps   . Mitral regurgitation     Mild to moderate 5/12    Social History Ms. Klosterman reports that she quit smoking about 33 years ago. Her smoking use included Cigarettes. She has a 10 pack-year smoking history. She has never used smokeless tobacco. Ms. Weideman reports that she does not drink alcohol.  Review of Systems Complete review of systems negative except as otherwise outlined in the clinical summary and also the following. No hospitalizations, stable appetite, no falls or syncope.  Physical Examination Filed Vitals:    07/05/14 1057  BP: 102/60  Pulse: 92   Filed Weights   07/05/14 1057  Weight: 170 lb (77.111 kg)    Elderly woman, appears comfortable at rest.  HEENT: Lids normal, conjunctiva normal, oropharynx clear with moist mucosa.  Neck: Supple, no elevated JVP or carotid bruits, no thyromegaly.  Lungs: Scattered rhonchi and expiratory wheeze, no labored breathing.  Cardiac: Irregularly irregular, soft apical systolic murmur, no pericardial rub.  Abdomen: Soft, nontender, bowel sound present.  Skin: Warm and dry.  Extremities: No pitting edema below the knees.  Musculoskeletal: No kyphosis.  Neuropsychiatric: Alert and oriented x3, affect appropriate.   Problem List and Plan   MITRAL REGURGITATION Associated with bileaflet mitral prolapse, moderate by echocardiogram in September of last year. Follow-up study will be obtained.  Atrial fibrillation Continue Coumadin, Cardizem CD, and digoxin.  MITRAL VALVE PROLAPSE Moderate bileaflet prolapse, anterior greater than posterior.   Secondary cardiomyopathy LVEF 55-60% as of last echocardiogram in September 2014.    Satira Sark, M.D., F.A.C.C.

## 2014-07-05 NOTE — Patient Instructions (Signed)
Your physician wants you to follow-up in: 6 months You will receive a reminder letter in the mail two months in advance. If you don't receive a letter, please call our office to schedule the follow-up appointment.      Your physician recommends that you continue on your current medications as directed. Please refer to the Current Medication list given to you today.     Your physician has requested that you have an echocardiogram. Echocardiography is a painless test that uses sound waves to create images of your heart. It provides your doctor with information about the size and shape of your heart and how well your heart's chambers and valves are working. This procedure takes approximately one hour. There are no restrictions for this procedure.      Thank you for choosing [facility] !    

## 2014-07-10 ENCOUNTER — Ambulatory Visit (INDEPENDENT_AMBULATORY_CARE_PROVIDER_SITE_OTHER): Payer: Medicare PPO | Admitting: *Deleted

## 2014-07-10 DIAGNOSIS — Z7901 Long term (current) use of anticoagulants: Secondary | ICD-10-CM

## 2014-07-10 DIAGNOSIS — I4891 Unspecified atrial fibrillation: Secondary | ICD-10-CM

## 2014-07-10 DIAGNOSIS — Z5181 Encounter for therapeutic drug level monitoring: Secondary | ICD-10-CM

## 2014-07-10 LAB — POCT INR: INR: 1.4

## 2014-07-17 ENCOUNTER — Ambulatory Visit (INDEPENDENT_AMBULATORY_CARE_PROVIDER_SITE_OTHER): Payer: Medicare PPO | Admitting: *Deleted

## 2014-07-17 ENCOUNTER — Ambulatory Visit (HOSPITAL_COMMUNITY)
Admission: RE | Admit: 2014-07-17 | Discharge: 2014-07-17 | Disposition: A | Discharge status: Home / Self Care | Payer: Medicare PPO | Source: Ambulatory Visit | Attending: Cardiology | Admitting: Cardiology

## 2014-07-17 DIAGNOSIS — G4733 Obstructive sleep apnea (adult) (pediatric): Secondary | ICD-10-CM | POA: Insufficient documentation

## 2014-07-17 DIAGNOSIS — I083 Combined rheumatic disorders of mitral, aortic and tricuspid valves: Secondary | ICD-10-CM | POA: Diagnosis not present

## 2014-07-17 DIAGNOSIS — I059 Rheumatic mitral valve disease, unspecified: Secondary | ICD-10-CM

## 2014-07-17 DIAGNOSIS — I4891 Unspecified atrial fibrillation: Secondary | ICD-10-CM

## 2014-07-17 DIAGNOSIS — I371 Nonrheumatic pulmonary valve insufficiency: Secondary | ICD-10-CM | POA: Insufficient documentation

## 2014-07-17 DIAGNOSIS — Z87891 Personal history of nicotine dependence: Secondary | ICD-10-CM | POA: Insufficient documentation

## 2014-07-17 DIAGNOSIS — I341 Nonrheumatic mitral (valve) prolapse: Secondary | ICD-10-CM | POA: Diagnosis present

## 2014-07-17 DIAGNOSIS — J449 Chronic obstructive pulmonary disease, unspecified: Secondary | ICD-10-CM | POA: Insufficient documentation

## 2014-07-17 DIAGNOSIS — Z5181 Encounter for therapeutic drug level monitoring: Secondary | ICD-10-CM

## 2014-07-17 DIAGNOSIS — Z7901 Long term (current) use of anticoagulants: Secondary | ICD-10-CM

## 2014-07-17 DIAGNOSIS — I340 Nonrheumatic mitral (valve) insufficiency: Secondary | ICD-10-CM

## 2014-07-17 LAB — POCT INR: INR: 2

## 2014-07-17 NOTE — Progress Notes (Signed)
  Echocardiogram 2D Echocardiogram has been performed.  [place], [place] 07/17/2014, 10:31 AM

## 2014-07-24 ENCOUNTER — Ambulatory Visit (INDEPENDENT_AMBULATORY_CARE_PROVIDER_SITE_OTHER): Payer: Medicare PPO | Admitting: *Deleted

## 2014-07-24 ENCOUNTER — Ambulatory Visit: Payer: Medicare PPO

## 2014-07-24 ENCOUNTER — Ambulatory Visit (INDEPENDENT_AMBULATORY_CARE_PROVIDER_SITE_OTHER): Payer: Medicare PPO

## 2014-07-24 DIAGNOSIS — Z5181 Encounter for therapeutic drug level monitoring: Secondary | ICD-10-CM

## 2014-07-24 DIAGNOSIS — Z7901 Long term (current) use of anticoagulants: Secondary | ICD-10-CM

## 2014-07-24 DIAGNOSIS — J4540 Moderate persistent asthma, uncomplicated: Secondary | ICD-10-CM | POA: Diagnosis not present

## 2014-07-24 DIAGNOSIS — J449 Chronic obstructive pulmonary disease, unspecified: Secondary | ICD-10-CM

## 2014-07-24 DIAGNOSIS — I4891 Unspecified atrial fibrillation: Secondary | ICD-10-CM

## 2014-07-24 LAB — POCT INR: INR: 2

## 2014-07-25 MED ORDER — OMALIZUMAB 150 MG ~~LOC~~ SOLR
300.0000 mg | Freq: Once | SUBCUTANEOUS | Status: AC
  Administered 2014-07-25: 300 mg via SUBCUTANEOUS

## 2014-08-02 ENCOUNTER — Ambulatory Visit (INDEPENDENT_AMBULATORY_CARE_PROVIDER_SITE_OTHER): Payer: Medicare PPO | Admitting: *Deleted

## 2014-08-02 DIAGNOSIS — I4891 Unspecified atrial fibrillation: Secondary | ICD-10-CM

## 2014-08-02 DIAGNOSIS — Z5181 Encounter for therapeutic drug level monitoring: Secondary | ICD-10-CM

## 2014-08-02 DIAGNOSIS — Z7901 Long term (current) use of anticoagulants: Secondary | ICD-10-CM

## 2014-08-02 LAB — POCT INR: INR: 2.5

## 2014-08-16 ENCOUNTER — Ambulatory Visit (INDEPENDENT_AMBULATORY_CARE_PROVIDER_SITE_OTHER): Payer: Medicare PPO | Admitting: *Deleted

## 2014-08-16 DIAGNOSIS — Z5181 Encounter for therapeutic drug level monitoring: Secondary | ICD-10-CM

## 2014-08-16 DIAGNOSIS — Z7901 Long term (current) use of anticoagulants: Secondary | ICD-10-CM

## 2014-08-16 DIAGNOSIS — I4891 Unspecified atrial fibrillation: Secondary | ICD-10-CM

## 2014-08-16 LAB — POCT INR: INR: 2.1

## 2014-08-18 ENCOUNTER — Telehealth: Payer: Self-pay | Admitting: Internal Medicine

## 2014-08-18 MED ORDER — PREDNISONE 10 MG PO TABS: ORAL_TABLET | ORAL | Status: DC

## 2014-08-18 NOTE — Telephone Encounter (Signed)
Called made pt aware of recs. rx sent in. Nothing further needed

## 2014-08-18 NOTE — Telephone Encounter (Signed)
Attempted to call pt  But the line is busy.  Will call back later.

## 2014-08-18 NOTE — Telephone Encounter (Signed)
Call in prescription for prednisone 10 mg pill >> 2 pills daily for 2 days, 1 pill daily for 2 days, 1/2 pill daily for 2 days.  Dispense 7 pills with no refills.

## 2014-08-18 NOTE — Telephone Encounter (Signed)
Spoke with pt. Reports chest congestion x2 days. Cough is present with white mucus. Wheezing is also present. Denies chest tightness, SOB or fever. Would like Prednisone sent in.  VS - please advise. Thanks!

## 2014-08-21 ENCOUNTER — Ambulatory Visit (INDEPENDENT_AMBULATORY_CARE_PROVIDER_SITE_OTHER): Payer: Medicare PPO

## 2014-08-21 ENCOUNTER — Telehealth: Payer: Self-pay | Admitting: *Deleted

## 2014-08-21 DIAGNOSIS — J4520 Mild intermittent asthma, uncomplicated: Secondary | ICD-10-CM

## 2014-08-21 NOTE — Telephone Encounter (Signed)
Pt was put on prednisone started Friday and is for 6 pills. Okay to talk to who ever answers phone, they will relay message, she has a Dr appt to go to/tmj

## 2014-08-21 NOTE — Telephone Encounter (Signed)
Pt was started on prednisone 10mg   12/11 by Dr Annamaria Boots for congestion and wheezing.  She is to take 20 mg x 2 days, 10mg  x 2 days, 5mg  x 2 days.  Last INR was 2.1.  Told pt to continue current dose of coumadin but eat extra Vit K foods while taking prednisone.  She verbalized understanding.

## 2014-08-22 ENCOUNTER — Telehealth: Payer: Self-pay | Admitting: Internal Medicine

## 2014-08-22 MED ORDER — OMALIZUMAB 150 MG ~~LOC~~ SOLR
300.0000 mg | Freq: Once | SUBCUTANEOUS | Status: AC
  Administered 2014-08-21: 300 mg via SUBCUTANEOUS

## 2014-08-22 MED ORDER — PREDNISONE 20 MG PO TABS: 20.0000 mg | ORAL_TABLET | Freq: Every day | ORAL | Status: DC

## 2014-08-22 NOTE — Telephone Encounter (Signed)
Spoke with pt's caregiver, Angie. States that pt is still having lots of chest congestion. Cough is deep and producing clear mucus. Pt has been having to use lots of breathing treatments. Would like to have more Prednisone sent in, but for more than 4 days worth.  Allergies  Allergen Reactions  . Amoxicillin-Pot Clavulanate   . Clarithromycin   . Clindamycin   . Doxycycline   . Penicillins     CY - please advise. Thanks.

## 2014-08-22 NOTE — Telephone Encounter (Signed)
Offer prednisone 20 mg, # 7, 1 daily. Call us back for advice as she gets to the end of that.

## 2014-08-22 NOTE — Telephone Encounter (Signed)
Spoke with angie and is aware of recs. RX sent in. notihng further needed

## 2014-08-23 ENCOUNTER — Telehealth: Payer: Self-pay | Admitting: *Deleted

## 2014-08-23 NOTE — Telephone Encounter (Signed)
Prednisone has been increased to 20mg  daily x 7 days by Dr Leitha Schuller starting today.  Told her to have pt decrease coumadin to 5mg  daily and come for INR check on Monday 12.21.15.  She is agreement.

## 2014-08-28 ENCOUNTER — Telehealth: Payer: Self-pay | Admitting: Internal Medicine

## 2014-08-28 ENCOUNTER — Ambulatory Visit (INDEPENDENT_AMBULATORY_CARE_PROVIDER_SITE_OTHER): Payer: Medicare PPO | Admitting: *Deleted

## 2014-08-28 DIAGNOSIS — Z5181 Encounter for therapeutic drug level monitoring: Secondary | ICD-10-CM

## 2014-08-28 DIAGNOSIS — I4891 Unspecified atrial fibrillation: Secondary | ICD-10-CM

## 2014-08-28 DIAGNOSIS — Z7901 Long term (current) use of anticoagulants: Secondary | ICD-10-CM

## 2014-08-28 LAB — POCT INR: INR: 2.7

## 2014-08-28 NOTE — Telephone Encounter (Signed)
Suggest prednisone 10 mg, #20, 1 daily x7 days, then one every other day. When she runs out, then quit and we will see how she does.

## 2014-08-28 NOTE — Telephone Encounter (Signed)
Called and spoke to [name]. [name] was informed to call back when pt is nearing the end of the pred rx. Pt is to take her last pred tablet tomorrow. Pt stated she feels better and has more energy, not using breathing treatments as often, improved SOB. Pt stated she is still having a lot of chest congestion but is able to easily bring up clear mucus. Per CY-ok to send and be addressed in morning.    Deneise Lever, MD at 08/22/2014 5:30 PM     Status: Signed       Expand All Collapse All   Offer prednisone 20 mg, # 7, 1 daily. Call us back for advice as she gets to the end of that.       Dr. Annamaria Boots please advise.

## 2014-08-29 MED ORDER — PREDNISONE 10 MG PO TABS: ORAL_TABLET | ORAL | Status: DC

## 2014-08-29 NOTE — Telephone Encounter (Signed)
Pt's ec aware of recs.  Med sent to pharmacy.  Nothing further needed.

## 2014-09-07 ENCOUNTER — Other Ambulatory Visit (HOSPITAL_COMMUNITY): Payer: Self-pay | Admitting: Family Medicine

## 2014-09-07 DIAGNOSIS — Z0000 Encounter for general adult medical examination without abnormal findings: Secondary | ICD-10-CM

## 2014-09-11 ENCOUNTER — Ambulatory Visit (INDEPENDENT_AMBULATORY_CARE_PROVIDER_SITE_OTHER): Payer: Medicare PPO | Admitting: *Deleted

## 2014-09-11 DIAGNOSIS — I4891 Unspecified atrial fibrillation: Secondary | ICD-10-CM

## 2014-09-11 DIAGNOSIS — Z5181 Encounter for therapeutic drug level monitoring: Secondary | ICD-10-CM

## 2014-09-11 DIAGNOSIS — Z7901 Long term (current) use of anticoagulants: Secondary | ICD-10-CM

## 2014-09-11 LAB — POCT INR: INR: 2.7

## 2014-09-13 ENCOUNTER — Other Ambulatory Visit (HOSPITAL_COMMUNITY): Payer: Medicare PPO

## 2014-09-20 ENCOUNTER — Ambulatory Visit (HOSPITAL_COMMUNITY)
Admission: RE | Admit: 2014-09-20 | Discharge: 2014-09-20 | Disposition: A | Discharge status: Home / Self Care | Payer: Medicare PPO | Source: Ambulatory Visit | Attending: Family Medicine | Admitting: Family Medicine

## 2014-09-20 DIAGNOSIS — M810 Age-related osteoporosis without current pathological fracture: Secondary | ICD-10-CM | POA: Insufficient documentation

## 2014-09-20 DIAGNOSIS — Z0000 Encounter for general adult medical examination without abnormal findings: Secondary | ICD-10-CM

## 2014-09-20 DIAGNOSIS — Z13820 Encounter for screening for osteoporosis: Secondary | ICD-10-CM | POA: Insufficient documentation

## 2014-09-21 ENCOUNTER — Ambulatory Visit: Payer: Medicare PPO

## 2014-09-25 ENCOUNTER — Other Ambulatory Visit: Payer: Self-pay | Admitting: Internal Medicine

## 2014-09-25 NOTE — Telephone Encounter (Signed)
CY Please advise on refill. Thanks.  

## 2014-09-25 NOTE — Telephone Encounter (Signed)
Our intent was to see if she could wean off prednisone. If she feels she stil needs it for her breathing, then refill 10 mg, # 20, 1 every other day, ref x 5

## 2014-09-27 ENCOUNTER — Ambulatory Visit (INDEPENDENT_AMBULATORY_CARE_PROVIDER_SITE_OTHER): Payer: Medicare PPO

## 2014-09-27 ENCOUNTER — Other Ambulatory Visit: Payer: Self-pay | Admitting: Cardiology

## 2014-09-27 ENCOUNTER — Ambulatory Visit (INDEPENDENT_AMBULATORY_CARE_PROVIDER_SITE_OTHER): Payer: Medicare PPO | Admitting: *Deleted

## 2014-09-27 DIAGNOSIS — J4540 Moderate persistent asthma, uncomplicated: Secondary | ICD-10-CM

## 2014-09-27 DIAGNOSIS — J449 Chronic obstructive pulmonary disease, unspecified: Secondary | ICD-10-CM

## 2014-09-27 DIAGNOSIS — Z7901 Long term (current) use of anticoagulants: Secondary | ICD-10-CM

## 2014-09-27 DIAGNOSIS — I4891 Unspecified atrial fibrillation: Secondary | ICD-10-CM

## 2014-09-27 DIAGNOSIS — Z5181 Encounter for therapeutic drug level monitoring: Secondary | ICD-10-CM

## 2014-09-27 LAB — POCT INR: INR: 2.9

## 2014-09-28 MED ORDER — OMALIZUMAB 150 MG ~~LOC~~ SOLR
300.0000 mg | Freq: Once | SUBCUTANEOUS | Status: AC
  Administered 2014-09-27: 300 mg via SUBCUTANEOUS

## 2014-10-05 ENCOUNTER — Ambulatory Visit: Payer: Medicare PPO | Admitting: Internal Medicine

## 2014-10-11 ENCOUNTER — Ambulatory Visit (INDEPENDENT_AMBULATORY_CARE_PROVIDER_SITE_OTHER): Payer: Medicare PPO | Admitting: *Deleted

## 2014-10-11 DIAGNOSIS — Z7901 Long term (current) use of anticoagulants: Secondary | ICD-10-CM

## 2014-10-11 DIAGNOSIS — I4891 Unspecified atrial fibrillation: Secondary | ICD-10-CM

## 2014-10-11 DIAGNOSIS — Z5181 Encounter for therapeutic drug level monitoring: Secondary | ICD-10-CM

## 2014-10-11 LAB — POCT INR: INR: 3.5

## 2014-10-17 ENCOUNTER — Telehealth: Payer: Self-pay | Admitting: Internal Medicine

## 2014-10-17 MED ORDER — PREDNISONE 10 MG PO TABS: ORAL_TABLET | ORAL | Status: DC

## 2014-10-17 NOTE — Telephone Encounter (Signed)
Pt daughter [name] calling for refill of Prednisone.  Daughter states that patient is having a lot of chest congestion, cough with clear mucus and using nebulizer more frequently.   Please advise Dr Annamaria Boots. Thanks.  Allergies  Allergen Reactions  . Amoxicillin-Pot Clavulanate   . Clarithromycin   . Clindamycin   . Doxycycline   . Penicillins      Medication List       This list is accurate as of: 10/17/14  9:41 AM.  Always use your most recent med list.               albuterol (2.5 MG/3ML) 0.083% nebulizer solution  Commonly known as:  PROVENTIL  Take 3 mLs (2.5 mg total) by nebulization every 4 (four) hours as needed for wheezing or shortness of breath.     PROAIR HFA 108 (90 BASE) MCG/ACT inhaler  Generic drug:  albuterol  INHALE 1 TO 2 PUFFS EVERY 4 TO 6 HOURS AS NEEDED FOR WHEEZING     atorvastatin 10 MG tablet  Commonly known as:  LIPITOR  Take 10 mg by mouth daily.     CALTRATE 600+D 600-400 MG-UNIT per tablet  Generic drug:  Calcium Carbonate-Vitamin D  Take 1 tablet by mouth daily.     cetirizine 10 MG tablet  Commonly known as:  ZYRTEC  Take 10 mg by mouth daily.     citalopram 20 MG tablet  Commonly known as:  CELEXA  Take 20 mg by mouth daily.     diazepam 5 MG tablet  Commonly known as:  VALIUM  Take 5 mg by mouth every 6 (six) hours as needed for anxiety.     digoxin 0.125 MG tablet  Commonly known as:  DIGOX  Take 1 tablet (0.125 mg total) by mouth daily.     digoxin 0.125 MG tablet  Commonly known as:  LANOXIN  TAKE 1 TABLET BY MOUTH EVERY DAY     diltiazem 240 MG 24 hr capsule  Commonly known as:  CARDIZEM CD  Take 1 capsule (240 mg total) by mouth daily.     EPINEPHrine 0.3 mg/0.3 mL Devi  Commonly known as:  EPI-PEN  Inject 0.3 mg into the muscle as needed.     esomeprazole 40 MG capsule  Commonly known as:  NEXIUM  Take 40 mg by mouth 2 (two) times daily.     furosemide 40 MG tablet  Commonly known as:  LASIX  Take 1 tablet (40 mg  total) by mouth daily.     mometasone 50 MCG/ACT nasal spray  Commonly known as:  NASONEX  Place 2 sprays into the nose as needed.     omalizumab 150 MG injection  Commonly known as:  XOLAIR  Inject 300 mg into the skin every 28 (twenty-eight) days.     potassium chloride SA 20 MEQ tablet  Commonly known as:  KLOR-CON M20  Take 1 tablet (20 mEq total) by mouth daily.     predniSONE 10 MG tablet  Commonly known as:  DELTASONE  Take 4 x 2 days, 3 x 2 days, 2 x 2 days, 1 x 2 days then stop.     predniSONE 10 MG tablet  Commonly known as:  DELTASONE  Take 2 tabs daily x 2 days, 1 tab daily x 2 days, 1/2 tab daily x 2 days then stop     predniSONE 20 MG tablet  Commonly known as:  DELTASONE  Take 1 tablet (20 mg total) by mouth  daily with breakfast.     predniSONE 10 MG tablet  Commonly known as:  DELTASONE  Take 1 tab qd X7 days, then qod until gone.     traMADol-acetaminophen 37.5-325 MG per tablet  Commonly known as:  ULTRACET  Take 1 tablet by mouth every 4 (four) hours as needed.     warfarin 5 MG tablet  Commonly known as:  COUMADIN  Take 1 tablet (5 mg total) by mouth one time only at 6 PM.

## 2014-10-17 NOTE — Telephone Encounter (Signed)
I think she had been on maintenance prednisone 20 mg daily.  Suggest Refill prednisone 10 mg, # 75   4 X 2 DAYS, 3 X 2 DAYS, then 2 daily

## 2014-10-17 NOTE — Telephone Encounter (Signed)
Called spoke with patient's daughter [name] who verified that pt is indeed taking prednisone 20mg  daily.  Advised on the increase as directed by CY.  Angie voiced her understanding.  Rx sent to verified pharmacy and daughter encouraged to call the office if anything further is needed.  Will sign off.

## 2014-10-21 ENCOUNTER — Other Ambulatory Visit: Payer: Self-pay | Admitting: Internal Medicine

## 2014-10-25 ENCOUNTER — Ambulatory Visit (INDEPENDENT_AMBULATORY_CARE_PROVIDER_SITE_OTHER): Payer: Medicare PPO

## 2014-10-25 ENCOUNTER — Ambulatory Visit (INDEPENDENT_AMBULATORY_CARE_PROVIDER_SITE_OTHER): Payer: Medicare PPO | Admitting: *Deleted

## 2014-10-25 DIAGNOSIS — I4891 Unspecified atrial fibrillation: Secondary | ICD-10-CM

## 2014-10-25 DIAGNOSIS — J4540 Moderate persistent asthma, uncomplicated: Secondary | ICD-10-CM | POA: Diagnosis not present

## 2014-10-25 DIAGNOSIS — J449 Chronic obstructive pulmonary disease, unspecified: Secondary | ICD-10-CM

## 2014-10-25 DIAGNOSIS — Z7901 Long term (current) use of anticoagulants: Secondary | ICD-10-CM

## 2014-10-25 DIAGNOSIS — Z5181 Encounter for therapeutic drug level monitoring: Secondary | ICD-10-CM

## 2014-10-25 LAB — POCT INR: INR: 2.5

## 2014-10-26 ENCOUNTER — Telehealth: Payer: Self-pay | Admitting: Internal Medicine

## 2014-10-26 MED ORDER — OMALIZUMAB 150 MG ~~LOC~~ SOLR
300.0000 mg | Freq: Once | SUBCUTANEOUS | Status: AC
  Administered 2014-10-25: 300 mg via SUBCUTANEOUS

## 2014-10-26 NOTE — Telephone Encounter (Signed)
Pt called back- states she returned a call to [employer] this morning and has since spoken with Alroy Bailiff and nothing more is needed at this time.

## 2014-10-26 NOTE — Telephone Encounter (Signed)
Unsure why patient is calling me back-I have left a message for patient to call me back.

## 2014-11-13 ENCOUNTER — Ambulatory Visit (INDEPENDENT_AMBULATORY_CARE_PROVIDER_SITE_OTHER): Payer: Medicare PPO | Admitting: Internal Medicine

## 2014-11-13 ENCOUNTER — Encounter: Payer: Self-pay | Admitting: Internal Medicine

## 2014-11-13 ENCOUNTER — Ambulatory Visit (INDEPENDENT_AMBULATORY_CARE_PROVIDER_SITE_OTHER): Payer: Medicare PPO | Admitting: *Deleted

## 2014-11-13 VITALS — BP 104/64 | HR 74 | Ht 66.5 in | Wt 168.6 lb

## 2014-11-13 DIAGNOSIS — Z5181 Encounter for therapeutic drug level monitoring: Secondary | ICD-10-CM

## 2014-11-13 DIAGNOSIS — J301 Allergic rhinitis due to pollen: Secondary | ICD-10-CM

## 2014-11-13 DIAGNOSIS — I4891 Unspecified atrial fibrillation: Secondary | ICD-10-CM

## 2014-11-13 DIAGNOSIS — J449 Chronic obstructive pulmonary disease, unspecified: Secondary | ICD-10-CM

## 2014-11-13 DIAGNOSIS — Z7901 Long term (current) use of anticoagulants: Secondary | ICD-10-CM

## 2014-11-13 LAB — POCT INR: INR: 3

## 2014-11-13 MED ORDER — FLUTICASONE FUROATE-VILANTEROL 200-25 MCG/INH IN AEPB: 1.0000 | INHALATION_SPRAY | Freq: Every day | RESPIRATORY_TRACT | Status: DC

## 2014-11-13 MED ORDER — ALBUTEROL SULFATE HFA 108 (90 BASE) MCG/ACT IN AERS: 2.0000 | INHALATION_SPRAY | Freq: Four times a day (QID) | RESPIRATORY_TRACT | Status: DC | PRN

## 2014-11-13 MED ORDER — OLOPATADINE HCL 0.7 % OP SOLN: 1.0000 [drp] | Freq: Every day | OPHTHALMIC | Status: DC

## 2014-11-13 NOTE — Patient Instructions (Signed)
Sample and script for Breo 200 inhaler   1 puff then rinse mouth, one time daily  Script to change your albuterol rescue inhaler from Proair  Sample Pazeo allergy eye drop        1 drop in eye(s) once daily

## 2014-11-13 NOTE — Progress Notes (Signed)
Patient ID: [name], female    DOB: 09-Nov-1933, 81 y.o.   MRN: [id number]  HPI 7/113/12-  76 yoF former smoker, followed for Chronic asthma/ COPD, chronic rhinosinusitis, complicated by hx AFib/ anticoagulation, CHF, cardiomyopathy, mitral insufficiency, GERD Last here December 11/19/10- note reviewed She has felt well controlled with no acute breathing issues. She saw Dr Domenic Polite for cardiac f/u 2 weeks ago and Echo was good with no med changes. Uses rescue inhaler only occasionally, usually on first rising. Little wheeze at times, much better. Little DOE with ADLs PFT 12/06/10= chronic obstructive asthma   7/113/12-  76 yoF former smoker, followed for Chronic asthma/ COPD, chronic rhinosinusitis, complicated by hx AFib/ anticoagulation, CHF, cardiomyopathy, mitral insufficiency, GERD Has had flu vaccine. Persistent head and chest congestion for the past 2 weeks. On November 7 we sent  Avelox for 5 days. This helped but didn't last long enough. She took a leftover prednisone taper but wants to avoid more prednisone now if possible. She has noted mild tremor in her hands for the past month no lateralizing finding. She continues Xolair injections. Using nebulizer or rescue inhaler only every 1 or 2 days. Has lost some weight and admits she has been dieting some but otherwise she's not sure that explains it. Thyroid function test normal in the past.  11/27/11- 76 yoF former smoker, followed for Chronic asthma/ COPD, chronic rhinosinusitis, complicated by hx AFib/ anticoagulation, CHF, cardiomyopathy, mitral insufficiency, GERD, Lung nodules Continues to do well. Continues Xolair. She thyroid nodule biopsy by interventional radiology. Waiting for results. Eyes have been watering some increased nasal drainage. We discussed possibility of seasonal allergic symptoms when she is getting Xolair. CT chest 10/20/2011-bilateral nodules less than 10 mm. Needs followup CT scan in May.  02/05/12- 76 yoF  former smoker, followed for Chronic asthma/ COPD, chronic rhinosinusitis, complicated by hx AFib/ anticoagulation, CHF, cardiomyopathy, mitral insufficiency, GERD, Lung nodules Denies any cough, congestion, SOB, or wheezing, Slight flare up with allergies at this time and still on Xolair. CAT score 8 She is pleased to be holding her current weight. Sometimes has a little wheeze but not much rarely needs a rescue inhaler and uses her nebulizer 2 or 3 times per week. Continues Xolair and believes it helps. Denies reflux or heartburn. CT chest  01/29/12- images reviewed with her. IMPRESSION:  1. Scattered bilateral pulmonary nodules are unchanged in size  from 10/22/2011. Additional follow-up CT chest without contrast  could be performed in 6 months to ensure continued stability, as  clinically indicated. This recommendation follows the consensus  statement: Guidelines for Management of Small Pulmonary Nodules  Detected on CT Scans: A Statement from the [place] as  published in Radiology 2005; 237:395-400. If multidisciplinary  follow up management is desired, this is available in the [place] through the [facility] [phone]-  [phone].  2. Dominant right thyroid nodule. Consider further evaluation with  thyroid ultrasound. If patient is clinically hyperthyroid,  consider nuclear medicine thyroid uptake and scan.  Original Report Authenticated By: Luretha Rued, M.D.    08/09/12- 57 yoF former smoker, followed for Chronic asthma/ COPD, chronic rhinosinusitis, complicated by hx AFib/ anticoagulation, CHF, cardiomyopathy, mitral insufficiency, GERD, Lung nodules FOLLOWS FOR: denies any SOB, wheezing or cough. States she is having chest/throat congestion. For 2 weeks has had "congestion" in throat and chest. Dr. Hilma Favors treated Levaquin and it one week ago. She denies purulent discharge, fever or wheeze. Echocardiogram 05/03/2012-further improved. EF  60-65% with mitral regurgitation and mitral valve prolapse. Chest refill Valium use occasionally. CT 08/04/12-reviewed with her IMPRESSION:  1. Stable bilateral pulmonary nodules. No new pulmonary nodules  are noted. A follow-up examination in 1 year is recommended to  assure stability.  2. Stable 2 cm low density nodule in the right lobe of the thyroid  gland.  3. No acute infiltrate or pulmonary edema. Small hiatal hernia  again noted.  Original Report Authenticated By: Lahoma Crocker, M.D.  02/07/13- 69 yoF former smoker, followed for Chronic asthma/ COPD, chronic rhinosinusitis, complicated by hx AFib/ anticoagulation, CHF, cardiomyopathy, mitral insufficiency, GERD, Lung nodules FOLLOWS FOR: still on Xolair; drainage in throat. Denies any flare ups with asthma or allergies at this time. Taking Zyrtec. Some postnasal drip. Had thyroid nodule biopsied.  08/11/13- 79 yoF former smoker, followed for Chronic asthma/ COPD, chronic rhinosinusitis, complicated by hx AFib/ anticoagulation, CHF, cardiomyopathy, mitral insufficiency, GERD, Lung nodules FOLLOWS FOR: has had shingles for aobut 4 weeks; still on Xolair and plans to get injection for December as she did not take November's injection. Shingles right lateral rib cage- valacyclovir Rx'd. Still hurts. It is not needing oxygen. CT 07/15/13  IMPRESSION:  1. No acute cardiopulmonary abnormalities.  2. Stable nodules from 10/22/2011. These are unchanged for almost 19  months and are likely benign.  3. Atherosclerotic disease.  4. Hiatal hernia status post fundoplication.  Electronically Signed  By: Kerby Moors M.D.  On: 07/14/2013 13:04  12/01/13- 79 yoF former smoker, followed for Chronic asthma/ COPD, chronic rhinosinusitis, complicated by hx AFib/ anticoagulation, CHF, cardiomyopathy, mitral insufficiency, GERD, Lung nodules ACUTE VISIT: stuffy head, coughing so hard (mainly at night) that she isnt sleeping. Productive at times-white in  color. Due for Xolair today however working with Insurance to approve. Ended prednisone 10 mg daily maintenance 3 weeks ago. Since then has felt more washed out and tired. Had lab work indicating no heart failure, but kidney function poor> Nephrology. Increased nasal congestion. Cough keeps her awake. Some wheeze. Using nebulizer 3 or 4 times daily, Xolair on schedule.  01/31/14- 79 yoF former smoker, followed for Chronic asthma/ COPD, chronic rhinosinusitis, Lung nodules complicated by hx AFib/ anticoagulation, CHF, cardiomyopathy, mitral insufficiency, GERD, CKD FOLLOWS FOR: Still on Xolair and doing well; no flare ups and no cough Says she is doing "great" now with no acute symptoms.  03/30/14-  79 yoF former smoker, followed for Chronic asthma/ COPD, chronic rhinosinusitis, Lung nodules complicated by hx AFib/ anticoagulation, CHF, cardiomyopathy, mitral insufficiency, GERD, CKD ACUTE VISIT: increased cough; SOB and wheezing. Had her Xolair injection on Friday 03-24-14 Recently on Avelox for 7 days for sinus infection from  PCP office as well as short dose of Pred . Does not feel sick. Sinusitis resolved. No headache. When she coughs her nose runs copious white clear mucus.  06/05/14-  80 yoF former smoker, followed for Chronic asthma/ COPD, chronic rhinosinusitis, Lung nodules complicated by hx AFib/ anticoagulation, CHF, cardiomyopathy, mitral insufficiency, GERD, CKD     Xolair FOLLOWS FOR: still on Xolair and doing well on it. Flu shot today as well. Albuterol neb needs ot go through [employer] from now on. Minor allergy symptoms this summer with watery eyes, but nothing bad. Feels very well. Admits on questioning that she does notice a little wheeze. Uses nebulizer machine occasionally.  11/13/14-80 yoF former smoker, followed for Chronic asthma/ COPD, chronic rhinosinusitis, Lung nodules complicated by hx AFib/ anticoagulation, CHF, cardiomyopathy, mitral insufficiency, GERD,  CKD FOLLOW FOR:  Asthma- Xolair working well for her.  breathing not too bad today, little congestion.  no concerns today. Breathing feels good. Blames allergy for irritated eyes x 3 weeks- on Zyrtec now. Stopped prednisone 2 weeks ago  Review of Systems-see HPI Constitutional:    weight loss, night sweats, fevers, chills, +fatigue, lassitude. HEENT:   No-  headaches, difficulty swallowing, tooth/dental problems, sore throat,       No-  sneezing, itching, ear ache, no- nasal congestion, +post nasal drip,  CV:  No- chest pain, no-orthopnea, PND, swelling in lower extremities, anasarca, dizziness, palpitations Resp: + acute  shortness of breath with exertion or at rest.              No-   productive cough,  +non-productive cough,  No- coughing up of blood.              No-   change in color of mucus. + wheezing.   Skin: No-   rash or lesions. GI:  No-   heartburn, indigestion, abdominal pain, nausea, vomiting,  GU: . MS:  No-   joint pain or swelling.  Neuro-     nothing unusual Psych:  No- change in mood or affect. No depression or anxiety.  No memory loss.  Objective:   Physical Exam General- Alert, Oriented, Affect-appropriate/ cheerful, Distress- none acute, thin Skin- no rash visible now on right lateral ribs Lymphadenopathy- none Head- atraumatic            Eyes- Gross vision intact, PERRLA, +conjunctivae injected            Ears- Hearing, canals-normal            Nose- clear, turbinate edema, no-Septal dev, mucus, polyps, erosion, perforation             Throat- Mallampati II , mucosa clear/ not red , drainage- none, tonsils- atrophic,  dentures.+ Mild hoarseness .+ Large torus hard palate Neck- flexible , trachea midline, no stridor , thyroid nl, carotid no bruit Chest - symmetrical excursion , unlabored           Heart/CV- +IRR/ AFib well controlled , + faint ? Mitral regurg murmur , no gallop  ,                               no rub, nl s1 s2                           - JVD-  none , edema- none, stasis changes- none, varices- none           Lung-  wheeze+I&E/unlabored,  Cough+light , dullness-none, rub- none           Chest wall-  Abd-  Br/ Gen/ Rectal- Not done, not indicated Extrem- cyanosis- none, clubbing, none, atrophy- none, strength- nl Neuro- slight resting tremor

## 2014-11-20 NOTE — Assessment & Plan Note (Signed)
Persistent mild wheeze. She feels well, but would like to clear the wheeze if possible, without more systemic steroids. Plan- sample and Rx Breo Ellipta

## 2014-11-20 NOTE — Assessment & Plan Note (Signed)
Nose not involved yet, but allergic conjunctivitis seems to have started with onset of spring tree pollens Plan - try Pazeo mast-cell stabilizer drops.

## 2014-11-22 ENCOUNTER — Other Ambulatory Visit: Payer: Self-pay | Admitting: Orthopedic Surgery

## 2014-11-23 ENCOUNTER — Ambulatory Visit (INDEPENDENT_AMBULATORY_CARE_PROVIDER_SITE_OTHER): Payer: Medicare PPO

## 2014-11-23 DIAGNOSIS — J4540 Moderate persistent asthma, uncomplicated: Secondary | ICD-10-CM

## 2014-11-23 MED ORDER — OMALIZUMAB 150 MG ~~LOC~~ SOLR
300.0000 mg | Freq: Once | SUBCUTANEOUS | Status: AC
  Administered 2014-11-23: 300 mg via SUBCUTANEOUS

## 2014-11-29 ENCOUNTER — Telehealth: Payer: Self-pay | Admitting: Internal Medicine

## 2014-11-29 NOTE — Telephone Encounter (Signed)
I called [facility] To order her Arvid Right, they have to get her permission to ship,before coordinating shipment. Pharm. Will call back once they hear from the pt. I'm leaving this open til we hear from the pharm.Marland Kitchen

## 2014-12-04 ENCOUNTER — Ambulatory Visit (INDEPENDENT_AMBULATORY_CARE_PROVIDER_SITE_OTHER): Payer: Medicare PPO | Admitting: *Deleted

## 2014-12-04 DIAGNOSIS — Z5181 Encounter for therapeutic drug level monitoring: Secondary | ICD-10-CM

## 2014-12-04 DIAGNOSIS — I4891 Unspecified atrial fibrillation: Secondary | ICD-10-CM | POA: Diagnosis not present

## 2014-12-04 DIAGNOSIS — Z7901 Long term (current) use of anticoagulants: Secondary | ICD-10-CM | POA: Diagnosis not present

## 2014-12-04 LAB — POCT INR: INR: 2.6

## 2014-12-05 NOTE — Telephone Encounter (Signed)
Pharm. Called and set up delivery with Caryl Pina for 12/05/14 yesterday.

## 2014-12-21 ENCOUNTER — Ambulatory Visit (INDEPENDENT_AMBULATORY_CARE_PROVIDER_SITE_OTHER): Payer: Medicare PPO

## 2014-12-21 DIAGNOSIS — J4540 Moderate persistent asthma, uncomplicated: Secondary | ICD-10-CM

## 2014-12-21 MED ORDER — OMALIZUMAB 150 MG ~~LOC~~ SOLR
300.0000 mg | Freq: Once | SUBCUTANEOUS | Status: AC
  Administered 2014-12-21: 300 mg via SUBCUTANEOUS

## 2015-01-01 ENCOUNTER — Telehealth: Payer: Self-pay | Admitting: Internal Medicine

## 2015-01-01 NOTE — Telephone Encounter (Signed)
Per Alroy Bailiff, she has already taken care of this. Pt's medication will be shipped to [place] on 01/09/15. Nothing further was needed.

## 2015-01-03 ENCOUNTER — Ambulatory Visit (INDEPENDENT_AMBULATORY_CARE_PROVIDER_SITE_OTHER): Payer: Medicare PPO | Admitting: Cardiology

## 2015-01-03 ENCOUNTER — Ambulatory Visit: Payer: Medicare PPO | Admitting: Cardiology

## 2015-01-03 ENCOUNTER — Ambulatory Visit (INDEPENDENT_AMBULATORY_CARE_PROVIDER_SITE_OTHER): Payer: Medicare PPO | Admitting: *Deleted

## 2015-01-03 ENCOUNTER — Encounter: Payer: Self-pay | Admitting: Cardiology

## 2015-01-03 VITALS — BP 118/70 | HR 78 | Ht 66.0 in | Wt 165.0 lb

## 2015-01-03 DIAGNOSIS — Z5181 Encounter for therapeutic drug level monitoring: Secondary | ICD-10-CM | POA: Diagnosis not present

## 2015-01-03 DIAGNOSIS — I059 Rheumatic mitral valve disease, unspecified: Secondary | ICD-10-CM | POA: Diagnosis not present

## 2015-01-03 DIAGNOSIS — I4891 Unspecified atrial fibrillation: Secondary | ICD-10-CM

## 2015-01-03 DIAGNOSIS — I340 Nonrheumatic mitral (valve) insufficiency: Secondary | ICD-10-CM | POA: Diagnosis not present

## 2015-01-03 DIAGNOSIS — I482 Chronic atrial fibrillation, unspecified: Secondary | ICD-10-CM

## 2015-01-03 DIAGNOSIS — Z7901 Long term (current) use of anticoagulants: Secondary | ICD-10-CM | POA: Diagnosis not present

## 2015-01-03 DIAGNOSIS — I429 Cardiomyopathy, unspecified: Secondary | ICD-10-CM | POA: Diagnosis not present

## 2015-01-03 LAB — POCT INR: INR: 2.2

## 2015-01-03 NOTE — Patient Instructions (Addendum)
Your physician wants you to follow-up in: 6 months with Dr.McDowell You will receive a reminder letter in the mail two months in advance. If you don't receive a letter, please call our office to schedule the follow-up appointment.  Your physician has requested that you have an echocardiogram IN 6 MONTHS. Echocardiography is a painless test that uses sound waves to create images of your heart. It provides your doctor with information about the size and shape of your heart and how well your heart's chambers and valves are working. This procedure takes approximately one hour. There are no restrictions for this procedure.    Your physician recommends that you continue on your current medications as directed. Please refer to the Current Medication list given to you today.      Thank you for choosing [facility] !

## 2015-01-03 NOTE — Progress Notes (Signed)
Cardiology Office Note  Date: 01/03/2015   ID: [name], DOB 30-Nov-1933, MRN [id number]  PCP: Purvis Kilts, MD  Primary Cardiologist: [name], MD   Chief Complaint  Patient presents with  . Cardiomyopathy  . Atrial Fibrillation  . Mitral valve disease    History of Present Illness: [name] is a 81 y.o. female last seen in October 2015. She presents for a routine follow-up visit. Despite the pollen season, she actually seems to be doing fairly well. Inhalers were modified by Dr. Annamaria Boots back in March and she has been doing much better. She is on Zyrtec daily as well. She reports stable dyspnea on exertion, no sense of palpitations or chest pain.  I reviewed her echocardiogram done this past November. Mitral valve disease has been stable over time.  ECG today shows atrial fibrillation with low voltage and nonspecific T-wave changes.  No reported spontaneous bleeding problems on Coumadin. She is due for an INR check today.  Past Medical History  Diagnosis Date  . Asthma   . Atrial fibrillation   . COPD (chronic obstructive pulmonary disease)   . GERD (gastroesophageal reflux disease)   . Mitral valve prolapse   . Nonischemic cardiomyopathy     LVEF 25-30% improved to 45-50%  . Chronic rhinitis   . Obstructive sleep apnea   . Transudative pleural effusion   . Nasal polyps   . Mitral regurgitation     Mild to moderate 5/12    Past Surgical History  Procedure Laterality Date  . Laparoscopic nissen fundoplication  [id number]  . Appendectomy  2000  . Nose surgery    . Bilateral cataract surgery      Current Outpatient Prescriptions  Medication Sig Dispense Refill  . albuterol (PROVENTIL HFA;VENTOLIN HFA) 108 (90 BASE) MCG/ACT inhaler Inhale 2 puffs into the lungs every 6 (six) hours as needed for wheezing or shortness of breath. 1 Inhaler 11  . albuterol (PROVENTIL) (2.5 MG/3ML) 0.083% nebulizer solution Take 3 mLs (2.5 mg total) by nebulization  every 4 (four) hours as needed for wheezing or shortness of breath. 360 mL 3  . atorvastatin (LIPITOR) 10 MG tablet Take 10 mg by mouth daily.      . Calcium Carbonate-Vitamin D (CALTRATE 600+D) 600-400 MG-UNIT per tablet Take 1 tablet by mouth daily.      . cetirizine (ZYRTEC) 10 MG tablet Take 10 mg by mouth daily.    . citalopram (CELEXA) 20 MG tablet Take 20 mg by mouth daily.     . diazepam (VALIUM) 5 MG tablet Take 5 mg by mouth every 6 (six) hours as needed for anxiety.    . digoxin (LANOXIN) 0.125 MG tablet TAKE 1 TABLET BY MOUTH EVERY DAY 90 tablet 3  . diltiazem (CARDIZEM CD) 240 MG 24 hr capsule Take 1 capsule (240 mg total) by mouth daily. 90 capsule 3  . EPINEPHrine (EPI-PEN) 0.3 MG/0.3ML DEVI Inject 0.3 mg into the muscle as needed.      Marland Kitchen esomeprazole (NEXIUM) 40 MG capsule Take 40 mg by mouth 2 (two) times daily.      . Fluticasone Furoate-Vilanterol (BREO ELLIPTA) 200-25 MCG/INH AEPB Inhale 1 puff into the lungs daily. Rinse mouth 60 each 11  . Fluticasone Furoate-Vilanterol (BREO ELLIPTA) 200-25 MCG/INH AEPB Inhale 1 puff into the lungs daily. 1 each 0  . furosemide (LASIX) 40 MG tablet Take 1 tablet (40 mg total) by mouth daily. 90 tablet 3  . mometasone (NASONEX) 50 MCG/ACT  nasal spray Place 2 sprays into the nose as needed.     . Olopatadine HCl (PAZEO) 0.7 % SOLN Apply 1 drop to eye daily. 1 Bottle 0  . omalizumab (XOLAIR) 150 MG injection Inject 300 mg into the skin every 28 (twenty-eight) days. 2 vial 2  . potassium chloride SA (KLOR-CON M20) 20 MEQ tablet Take 1 tablet (20 mEq total) by mouth daily. 30 tablet 6  . predniSONE (DELTASONE) 20 MG tablet Take 1 tablet (20 mg total) by mouth daily with breakfast. 7 tablet 0  . traMADol-acetaminophen (ULTRACET) 37.5-325 MG per tablet Take 1 tablet by mouth every 4 (four) hours as needed. 90 tablet 5  . warfarin (COUMADIN) 5 MG tablet Take 1 tablet (5 mg total) by mouth one time only at 6 PM. 90 tablet 3   No current  facility-administered medications for this visit.    Allergies:  Amoxicillin-pot clavulanate; Clarithromycin; Clindamycin; Doxycycline; and Penicillins   Social History: The patient  reports that she quit smoking about 34 years ago. Her smoking use included Cigarettes. She has a 10 pack-year smoking history. She has never used smokeless tobacco. She reports that she does not drink alcohol or use illicit drugs.   ROS:  Please see the history of present illness. Otherwise, complete review of systems is positive for intermittent cough, nonproductive.  All other systems are reviewed and negative.   Physical Exam: VS:  BP 118/70 mmHg  Pulse 78  Ht 5\' 6"  (1.676 m)  Wt 165 lb (74.844 kg)  BMI 26.64 kg/m2  SpO2 97%, BMI Body mass index is 26.64 kg/(m^2).  Wt Readings from Last 3 Encounters:  01/03/15 165 lb (74.844 kg)  11/13/14 168 lb 9.6 oz (76.476 kg)  07/05/14 170 lb (77.111 kg)     Elderly woman, appears comfortable at rest.  HEENT: Lids normal, conjunctiva normal, oropharynx clear with moist mucosa.  Neck: Supple, no elevated JVP or carotid bruits, no thyromegaly.  Lungs: Scattered rhonchi and expiratory wheeze, no labored breathing.  Cardiac: Irregularly irregular, soft apical systolic murmur, no pericardial rub.  Abdomen: Soft, nontender, bowel sound present.  Skin: Warm and dry.  Extremities: No pitting edema below the knees.  Musculoskeletal: No kyphosis.  Neuropsychiatric: Alert and oriented x3, affect appropriate.   ECG: ECG is ordered today.  Other Studies Reviewed Today:  Echocardiogram 07/17/2014: Study Conclusions  - Left ventricle: The cavity size was normal. Wall thickness was normal. Systolic function was normal. The estimated ejection fraction was in the range of 50% to 55%. Wall motion was normal; there were no regional wall motion abnormalities. The study is not technically sufficient to allow evaluation of LV diastolic function. -  Aortic valve: Mildly calcified annulus. Trileaflet. There was trivial regurgitation. - Mitral valve: Calcified annulus. Moderate thickening. Moderate prolapse, involving the anterior leaflet and the posterior leaflet. There was moderate to severe regurgitation. Regurgitant volume (PISA): 44 ml. MV to AV VTI ratio > 1.3 more consistent with severe mitral regurgitation. - Left atrium: The atrium was severely dilated. - Right atrium: The atrium was mildly dilated. Central venous pressure (est): 3 mm Hg. - Atrial septum: No defect or patent foramen ovale was identified. - Tricuspid valve: There was mild regurgitation. - Pulmonary arteries: Systolic pressure could not be accurately estimated. - Pericardium, extracardiac: There was no pericardial effusion.  Impressions:  - Normal LV wall thickness and chamber size with LVEF 50-55%, indeterminate diastolic function. Severe left atrial enlargement. Moderately thickened and calcified mitral leaflets with moderate prolapse. There  is moderate to severe, eccentric mitral regurgitation. Mildly sclerotic valve with trivial aortic regurgitation. Mild tricuspid regurgitation, unable to assess PASP.   ASSESSMENT AND PLAN:  1. Chronic atrial fibrillation, continue strategy of heart rate control and anticoagulation.  2. Mitral valve prolapse with moderate to severe mitral regurgitation by last assessment. Follow-up echocardiogram for next visit in 6 months.  3. History of cardiomyopathy, most recent LVEF 50-55% range.  Current medicines were reviewed at length with the patient today.   Orders Placed This Encounter  Procedures  . EKG 12-Lead  . 2D Echocardiogram with contrast    Disposition: FU with me in 6 months.   Signed, Satira Sark, MD, [facility] 01/03/2015 1:44 PM    [facility] at [facility] [address], [place], [place] [address] Phone: [phone]; Fax: [phone]

## 2015-01-15 ENCOUNTER — Telehealth: Payer: Self-pay | Admitting: Orthopedic Surgery

## 2015-01-15 NOTE — Telephone Encounter (Signed)
Call from Patient's niece, Standley Dakins, her primary contact person, with patient in background, inquiring about appointment for injuries to shoulder and knee (possibly other injuries) sustained when she fell yesterday 01/14/15.  Relayed we're happy to schedule, however, due to fall, recommend going to the Emergency Room for work-up, assessment, and imaging as needed.  We can then better schedule accordingly. In addition, based on insurance in [facility] system, patient will likely also need referral from primary care provider, [facility], for her [facility] plan.  States she will have patient seen at [facility] Emergency department.

## 2015-01-16 ENCOUNTER — Encounter (HOSPITAL_COMMUNITY): Payer: Self-pay

## 2015-01-16 ENCOUNTER — Emergency Department (HOSPITAL_COMMUNITY): Payer: Medicare PPO

## 2015-01-16 ENCOUNTER — Emergency Department (HOSPITAL_COMMUNITY)
Admission: EM | Admit: 2015-01-16 | Discharge: 2015-01-16 | Disposition: A | Discharge status: Home / Self Care | Payer: Medicare PPO | Attending: Emergency Medicine | Admitting: Emergency Medicine

## 2015-01-16 DIAGNOSIS — W01198A Fall on same level from slipping, tripping and stumbling with subsequent striking against other object, initial encounter: Secondary | ICD-10-CM | POA: Insufficient documentation

## 2015-01-16 DIAGNOSIS — Z7951 Long term (current) use of inhaled steroids: Secondary | ICD-10-CM | POA: Diagnosis not present

## 2015-01-16 DIAGNOSIS — Z87891 Personal history of nicotine dependence: Secondary | ICD-10-CM | POA: Diagnosis not present

## 2015-01-16 DIAGNOSIS — Z7901 Long term (current) use of anticoagulants: Secondary | ICD-10-CM | POA: Diagnosis not present

## 2015-01-16 DIAGNOSIS — Z8673 Personal history of transient ischemic attack (TIA), and cerebral infarction without residual deficits: Secondary | ICD-10-CM | POA: Insufficient documentation

## 2015-01-16 DIAGNOSIS — Z7952 Long term (current) use of systemic steroids: Secondary | ICD-10-CM | POA: Insufficient documentation

## 2015-01-16 DIAGNOSIS — Y9389 Activity, other specified: Secondary | ICD-10-CM | POA: Diagnosis not present

## 2015-01-16 DIAGNOSIS — S3992XA Unspecified injury of lower back, initial encounter: Secondary | ICD-10-CM | POA: Insufficient documentation

## 2015-01-16 DIAGNOSIS — J449 Chronic obstructive pulmonary disease, unspecified: Secondary | ICD-10-CM | POA: Diagnosis not present

## 2015-01-16 DIAGNOSIS — S8992XA Unspecified injury of left lower leg, initial encounter: Secondary | ICD-10-CM | POA: Diagnosis not present

## 2015-01-16 DIAGNOSIS — R059 Cough, unspecified: Secondary | ICD-10-CM

## 2015-01-16 DIAGNOSIS — Z8669 Personal history of other diseases of the nervous system and sense organs: Secondary | ICD-10-CM | POA: Diagnosis not present

## 2015-01-16 DIAGNOSIS — Y998 Other external cause status: Secondary | ICD-10-CM | POA: Diagnosis not present

## 2015-01-16 DIAGNOSIS — J45909 Unspecified asthma, uncomplicated: Secondary | ICD-10-CM | POA: Diagnosis not present

## 2015-01-16 DIAGNOSIS — K219 Gastro-esophageal reflux disease without esophagitis: Secondary | ICD-10-CM | POA: Diagnosis not present

## 2015-01-16 DIAGNOSIS — Z880 Allergy status to penicillin: Secondary | ICD-10-CM | POA: Diagnosis not present

## 2015-01-16 DIAGNOSIS — Y9289 Other specified places as the place of occurrence of the external cause: Secondary | ICD-10-CM | POA: Diagnosis not present

## 2015-01-16 DIAGNOSIS — Z79899 Other long term (current) drug therapy: Secondary | ICD-10-CM | POA: Diagnosis not present

## 2015-01-16 DIAGNOSIS — S40011A Contusion of right shoulder, initial encounter: Secondary | ICD-10-CM | POA: Insufficient documentation

## 2015-01-16 DIAGNOSIS — M545 Low back pain: Secondary | ICD-10-CM

## 2015-01-16 DIAGNOSIS — I4891 Unspecified atrial fibrillation: Secondary | ICD-10-CM | POA: Insufficient documentation

## 2015-01-16 DIAGNOSIS — S4991XA Unspecified injury of right shoulder and upper arm, initial encounter: Secondary | ICD-10-CM | POA: Diagnosis present

## 2015-01-16 DIAGNOSIS — M25562 Pain in left knee: Secondary | ICD-10-CM

## 2015-01-16 DIAGNOSIS — R05 Cough: Secondary | ICD-10-CM

## 2015-01-16 MED ORDER — ACETAMINOPHEN 500 MG PO TABS
1000.0000 mg | ORAL_TABLET | Freq: Once | ORAL | Status: AC
  Administered 2015-01-16: 1000 mg via ORAL
  Filled 2015-01-16: qty 2

## 2015-01-16 MED ORDER — IBUPROFEN 800 MG PO TABS
800.0000 mg | ORAL_TABLET | Freq: Once | ORAL | Status: DC
  Filled 2015-01-16: qty 1

## 2015-01-16 NOTE — ED Notes (Signed)
Pt reports waking up at 4am to help her dogs and she began to walk and left knee buckled and she fell. Pt denies loc. Reports hitting rt shoulder, left knee and lower back.

## 2015-01-16 NOTE — Discharge Instructions (Signed)
xrays normal  Please call your doctor for a followup appointment within 24-48 hours. When you talk to your doctor please let them know that you were seen in the emergency department and have them acquire all of your records so that they can discuss the findings with you and formulate a treatment plan to fully care for your new and ongoing problems.

## 2015-01-16 NOTE — ED Provider Notes (Signed)
CSN: [id number]     Arrival date & time 01/16/15  1032 History  This chart was scribed for Noemi Chapel, MD by Eustaquio Maize, ED Scribe. This patient was seen in room APA09/APA09 and the patient's care was started at 11:26 AM.     Chief Complaint  Patient presents with  . Fall   The history is provided by the patient and a relative. No language interpreter was used.     HPI Comments: [name] is a 81 y.o. female who presents to the Emergency Department complaining of left knee pain s/p ground level fall that occurred approximately 2 days ago. Pt mentions that she got up around 3 AM to check on her barking dogs when her left knee suddenly gave out on her, causing her to fall on hardwood floors. Pt fell straight onto her buttocks. Pt was unable to get up and had to call for family to help her. She also complains of right shoulder pain due to hitting it against the wall. Pt mentions a productive cough as well. No LOC. She denies fever, chills, or any other symptoms.   Past Medical History  Diagnosis Date  . Asthma   . Atrial fibrillation   . COPD (chronic obstructive pulmonary disease)   . GERD (gastroesophageal reflux disease)   . Mitral valve prolapse   . Nonischemic cardiomyopathy     LVEF 25-30% improved to 45-50%  . Chronic rhinitis   . Obstructive sleep apnea   . Transudative pleural effusion   . Nasal polyps   . Mitral regurgitation    Past Surgical History  Procedure Laterality Date  . Laparoscopic nissen fundoplication  [id number]  . Appendectomy  2000  . Nose surgery    . Bilateral cataract surgery     Family History  Problem Relation Age of Onset  . Atrial fibrillation Sister   . Cancer Brother     Lung  . Cancer Brother     Lung  . Cancer Father     Colon  . Cancer Mother     Colon   History  Substance Use Topics  . Smoking status: Former Smoker -- 0.50 packs/day for 20 years    Types: Cigarettes    Quit date: 10/08/1980  . Smokeless tobacco: Never Used   . Alcohol Use: No   OB History    No data available     Review of Systems  Respiratory: Positive for cough.   Musculoskeletal: Positive for back pain and arthralgias (Left knee pain. Right shoulder pain. ).  Neurological: Negative for syncope.      Allergies  Amoxicillin-pot clavulanate; Clarithromycin; Clindamycin; Doxycycline; and Penicillins  Home Medications   Prior to Admission medications   Medication Sig Start Date End Date Taking? Authorizing Provider  albuterol (PROVENTIL HFA;VENTOLIN HFA) 108 (90 BASE) MCG/ACT inhaler Inhale 2 puffs into the lungs every 6 (six) hours as needed for wheezing or shortness of breath. 11/13/14  Yes Deneise Lever, MD  albuterol (PROVENTIL) (2.5 MG/3ML) 0.083% nebulizer solution Take 3 mLs (2.5 mg total) by nebulization every 4 (four) hours as needed for wheezing or shortness of breath. 06/05/14 02/08/17 Yes Clinton D Young, MD  atorvastatin (LIPITOR) 10 MG tablet Take 10 mg by mouth daily.     Yes Historical Provider, MD  Calcium Carbonate-Vitamin D (CALTRATE 600+D) 600-400 MG-UNIT per tablet Take 1 tablet by mouth daily.     Yes Historical Provider, MD  cetirizine (ZYRTEC) 10 MG tablet Take 10 mg by mouth  daily.   Yes Historical Provider, MD  citalopram (CELEXA) 40 MG tablet Take 1 tablet by mouth daily. 01/01/15  Yes Historical Provider, MD  diazepam (VALIUM) 2 MG tablet Take 2 mg by mouth 3 (three) times daily as needed for anxiety.   Yes Historical Provider, MD  digoxin (LANOXIN) 0.125 MG tablet TAKE 1 TABLET BY MOUTH EVERY DAY 09/27/14  Yes Satira Sark, MD  diltiazem (CARDIZEM CD) 240 MG 24 hr capsule Take 1 capsule (240 mg total) by mouth daily. 03/14/14  Yes Satira Sark, MD  EPINEPHrine (EPI-PEN) 0.3 MG/0.3ML DEVI Inject 0.3 mg into the muscle as needed.     Yes Historical Provider, MD  esomeprazole (NEXIUM) 40 MG capsule Take 40 mg by mouth 2 (two) times daily.     Yes Historical Provider, MD  Fluticasone Furoate-Vilanterol (BREO  ELLIPTA) 200-25 MCG/INH AEPB Inhale 1 puff into the lungs daily. Rinse mouth 11/13/14  Yes Deneise Lever, MD  furosemide (LASIX) 40 MG tablet Take 1 tablet (40 mg total) by mouth daily. 03/14/14  Yes Satira Sark, MD  mometasone (NASONEX) 50 MCG/ACT nasal spray Place 2 sprays into the nose as needed (congestion/allergies).    Yes Historical Provider, MD  omalizumab Arvid Right) 150 MG injection Inject 300 mg into the skin every 28 (twenty-eight) days. 12/05/13  Yes Deneise Lever, MD  potassium chloride SA (KLOR-CON M20) 20 MEQ tablet Take 1 tablet (20 mEq total) by mouth daily. 10/09/11  Yes Satira Sark, MD  predniSONE (DELTASONE) 20 MG tablet Take 1 tablet (20 mg total) by mouth daily with breakfast. 08/22/14  Yes Deneise Lever, MD  traMADol-acetaminophen (ULTRACET) 37.5-325 MG per tablet Take 1 tablet by mouth every 4 (four) hours as needed. Patient taking differently: Take 1 tablet by mouth every 4 (four) hours as needed for moderate pain.  05/09/14  Yes Carole Civil, MD  warfarin (COUMADIN) 5 MG tablet Take 1 tablet (5 mg total) by mouth one time only at 6 PM. Patient taking differently: Take 5-7.5 mg by mouth See admin instructions. Take 7.5 mg on Thursday and 5 mg on all other days. 03/14/14  Yes Satira Sark, MD  Fluticasone Furoate-Vilanterol (BREO ELLIPTA) 200-25 MCG/INH AEPB Inhale 1 puff into the lungs daily. Patient not taking: Reported on 01/16/2015 11/13/14   Deneise Lever, MD  Olopatadine HCl (PAZEO) 0.7 % SOLN Apply 1 drop to eye daily. Patient not taking: Reported on 01/16/2015 11/13/14   Deneise Lever, MD   Triage Vitals: BP 115/53 mmHg  Pulse 93  Temp(Src) 97.7 F (36.5 C) (Oral)  Resp 16  SpO2 91%   Physical Exam  Constitutional: She appears well-developed and well-nourished.  HENT:  Head: Normocephalic and atraumatic.  Eyes: Conjunctivae are normal. Right eye exhibits no discharge. Left eye exhibits no discharge.  Cardiovascular: Normal rate, regular rhythm  and normal heart sounds.   Pulmonary/Chest: Effort normal and breath sounds normal. No respiratory distress. She has no wheezes. She has no rales.  Rhonchi. Speaks in full sentences. In no distress.   Musculoskeletal:  Straight leg raise intact on left.  No asymmetry of knees.  No clinical effusion.  No anterior or posterior laxity or medial or lateral laxity.  Tenderness over lumbar and sacral spine.  Right posterior scapula tenderness.  Decreased ROM of left knee.   Neurological: She is alert. Coordination normal.  Skin: Skin is warm and dry. No rash noted. She is not diaphoretic. No erythema.  Psychiatric: She  has a normal mood and affect.  Nursing note and vitals reviewed.   ED Course  Procedures (including critical care time)  DIAGNOSTIC STUDIES: Oxygen Saturation is 91% on RA, low by my interpretation.    COORDINATION OF CARE: 11:31 AM-Discussed treatment plan which includes CXR, DG L Spine, DG L Knee, DG R Shoulder with pt at bedside and pt agreed to plan.   Labs Review Labs Reviewed - No data to display  Imaging Review Dg Chest 2 View  01/16/2015   CLINICAL DATA:  Cough for 2 years years, worsening  EXAM: CHEST  2 VIEW  COMPARISON:  02/07/2013  FINDINGS: Mild cardiomegaly is stable from prior. Aortic and hilar contours are negative. Chronic bronchitic markings and mild hyperinflation. There is thickening along 1 of the major fissures in the lateral projection. There is no edema, consolidation, effusion, or pneumothorax. No acute osseous findings.  IMPRESSION: COPD without acute superimposed disease.   Electronically Signed   By: Monte Fantasia M.D.   On: 01/16/2015 12:40   Dg Lumbar Spine Complete  01/16/2015   CLINICAL DATA:  Acute lower back pain after fall yesterday. Initial encounter.  EXAM: LUMBAR SPINE - COMPLETE 4+ VIEW  COMPARISON:  April 20, 2013.  FINDINGS: Diffuse osteopenia is noted. No fracture is noted. Stable grade 1 anterolisthesis of L3-4 is noted  secondary to posterior facet joint hypertrophy. Mild degenerative disc disease is noted at L3-4. Atherosclerotic calcifications of abdominal aorta are noted.  IMPRESSION: Stable chronic findings as described above. No acute abnormality seen in the lumbar spine   Electronically Signed   By: Marijo Conception, M.D.   On: 01/16/2015 12:41   Dg Shoulder Right  01/16/2015   CLINICAL DATA:  Cough for 2 years, worsening. Bruise to the posterior right shoulder. Recent fall. Initial encounter.  EXAM: RIGHT SHOULDER - 2+ VIEW  COMPARISON:  None.  FINDINGS: There is no evidence of fracture or dislocation. No joint narrowing or spurring. No evidence of focal bone lesion.  IMPRESSION: Negative.   Electronically Signed   By: Monte Fantasia M.D.   On: 01/16/2015 12:42   Dg Knee Complete 4 Views Left  01/16/2015   CLINICAL DATA:  81 year old female with left knee giving way causing fall. Pain. Initial encounter.  EXAM: LEFT KNEE - COMPLETE 4+ VIEW  COMPARISON:  None.  FINDINGS: No fracture or dislocation.  Mild medial tibiofemoral joint space and minimal patellofemoral joint space degenerative changes.  Soft tissue prominence medial aspect of the knee may be normal versus result of recent injury.  IMPRESSION: No fracture or dislocation.  Soft tissue prominence medial aspect of the knee may be normal versus result of recent injury.  Mild medial tibiofemoral joint space and minimal patellofemoral joint space degenerative changes.   Electronically Signed   By: Genia Del M.D.   On: 01/16/2015 12:43    xrays neg, pt stable for d/c with OTC meds, CXR meg as well.  MDM   Final diagnoses:  Cough  Contusion of right shoulder, initial encounter  Low back pain without sciatica, unspecified back pain laterality  Left knee pain    I personally performed the services described in this documentation, which was scribed in my presence. The recorded information has been reviewed and is accurate.        Noemi Chapel,  MD 01/16/15 [phone]

## 2015-01-16 NOTE — ED Notes (Signed)
Patient states she cannot take ibuprofen due to her taking warfarin. MD notified and verbal order obtained to switch order to 1000 mg tylenol.

## 2015-01-16 NOTE — ED Notes (Signed)
MD at bedside. 

## 2015-01-16 NOTE — ED Notes (Signed)
Patient with no complaints at this time. Respirations even and unlabored. Skin warm/dry. Discharge instructions reviewed with patient at this time. Patient given opportunity to voice concerns/ask questions. Patient discharged at this time and left Emergency Department with steady gait.   

## 2015-01-17 ENCOUNTER — Telehealth: Payer: Self-pay | Admitting: Internal Medicine

## 2015-01-17 MED ORDER — TRAMADOL HCL 50 MG PO TABS: 50.0000 mg | ORAL_TABLET | Freq: Three times a day (TID) | ORAL | Status: DC | PRN

## 2015-01-17 MED ORDER — SULFAMETHOXAZOLE-TRIMETHOPRIM 800-160 MG PO TABS: 1.0000 | ORAL_TABLET | Freq: Two times a day (BID) | ORAL | Status: DC

## 2015-01-17 NOTE — Telephone Encounter (Signed)
Pt's daughter aware of results/recs.   meds called in. Nothing further needed.

## 2015-01-17 NOTE — Telephone Encounter (Signed)
Spoke with Angie Pt fell yesterday, was taken to [facility] ED for multiple x-rays.  Pt is c/o prod cough with bright yellow mucus since yesterday, no other breathing complaints.  Is requesting recs for the cough and wishes for CY to review cxr.   Pt uses [facility] [address]    Dr. Annamaria Boots please advise on recs.  Thanks!  Allergies  Allergen Reactions  . Amoxicillin-Pot Clavulanate   . Clarithromycin   . Clindamycin   . Doxycycline   . Penicillins    Current Outpatient Prescriptions on File Prior to Visit  Medication Sig Dispense Refill  . albuterol (PROVENTIL HFA;VENTOLIN HFA) 108 (90 BASE) MCG/ACT inhaler Inhale 2 puffs into the lungs every 6 (six) hours as needed for wheezing or shortness of breath. 1 Inhaler 11  . albuterol (PROVENTIL) (2.5 MG/3ML) 0.083% nebulizer solution Take 3 mLs (2.5 mg total) by nebulization every 4 (four) hours as needed for wheezing or shortness of breath. 360 mL 3  . atorvastatin (LIPITOR) 10 MG tablet Take 10 mg by mouth daily.      . Calcium Carbonate-Vitamin D (CALTRATE 600+D) 600-400 MG-UNIT per tablet Take 1 tablet by mouth daily.      . cetirizine (ZYRTEC) 10 MG tablet Take 10 mg by mouth daily.    . citalopram (CELEXA) 40 MG tablet Take 1 tablet by mouth daily.    . diazepam (VALIUM) 2 MG tablet Take 2 mg by mouth 3 (three) times daily as needed for anxiety.    . digoxin (LANOXIN) 0.125 MG tablet TAKE 1 TABLET BY MOUTH EVERY DAY 90 tablet 3  . diltiazem (CARDIZEM CD) 240 MG 24 hr capsule Take 1 capsule (240 mg total) by mouth daily. 90 capsule 3  . EPINEPHrine (EPI-PEN) 0.3 MG/0.3ML DEVI Inject 0.3 mg into the muscle as needed.      Marland Kitchen esomeprazole (NEXIUM) 40 MG capsule Take 40 mg by mouth 2 (two) times daily.      . Fluticasone Furoate-Vilanterol (BREO ELLIPTA) 200-25 MCG/INH AEPB Inhale 1 puff into the lungs daily. Rinse mouth 60 each 11  . Fluticasone Furoate-Vilanterol (BREO ELLIPTA) 200-25 MCG/INH AEPB Inhale 1 puff into the lungs daily. (Patient not  taking: Reported on 01/16/2015) 1 each 0  . furosemide (LASIX) 40 MG tablet Take 1 tablet (40 mg total) by mouth daily. 90 tablet 3  . mometasone (NASONEX) 50 MCG/ACT nasal spray Place 2 sprays into the nose as needed (congestion/allergies).     . Olopatadine HCl (PAZEO) 0.7 % SOLN Apply 1 drop to eye daily. (Patient not taking: Reported on 01/16/2015) 1 Bottle 0  . omalizumab (XOLAIR) 150 MG injection Inject 300 mg into the skin every 28 (twenty-eight) days. 2 vial 2  . potassium chloride SA (KLOR-CON M20) 20 MEQ tablet Take 1 tablet (20 mEq total) by mouth daily. 30 tablet 6  . predniSONE (DELTASONE) 20 MG tablet Take 1 tablet (20 mg total) by mouth daily with breakfast. 7 tablet 0  . traMADol-acetaminophen (ULTRACET) 37.5-325 MG per tablet Take 1 tablet by mouth every 4 (four) hours as needed. (Patient taking differently: Take 1 tablet by mouth every 4 (four) hours as needed for moderate pain. ) 90 tablet 5  . warfarin (COUMADIN) 5 MG tablet Take 1 tablet (5 mg total) by mouth one time only at 6 PM. (Patient taking differently: Take 5-7.5 mg by mouth See admin instructions. Take 7.5 mg on Thursday and 5 mg on all other days.) 90 tablet 3   No current  facility-administered medications on file prior to visit.

## 2015-01-17 NOTE — Telephone Encounter (Signed)
I reviewed CXR. No obvious pneumonia or trauma, but possibly bronchitis. Sorry she fell. Offer septra DS, # 14, 1 twice daily For cough try tramadol 50 mg, # 20, 1 every 8 hours if needed, and/ or Mucinex-DM

## 2015-01-18 ENCOUNTER — Ambulatory Visit: Payer: Medicare PPO

## 2015-01-25 ENCOUNTER — Ambulatory Visit: Payer: Medicare PPO

## 2015-01-25 DIAGNOSIS — N183 Chronic kidney disease, stage 3 (moderate): Secondary | ICD-10-CM | POA: Diagnosis not present

## 2015-01-25 DIAGNOSIS — N2581 Secondary hyperparathyroidism of renal origin: Secondary | ICD-10-CM | POA: Diagnosis not present

## 2015-01-25 DIAGNOSIS — D631 Anemia in chronic kidney disease: Secondary | ICD-10-CM | POA: Diagnosis not present

## 2015-01-25 DIAGNOSIS — I10 Essential (primary) hypertension: Secondary | ICD-10-CM | POA: Diagnosis not present

## 2015-01-29 ENCOUNTER — Ambulatory Visit: Payer: Medicare PPO

## 2015-01-31 ENCOUNTER — Ambulatory Visit (INDEPENDENT_AMBULATORY_CARE_PROVIDER_SITE_OTHER): Payer: Medicare PPO | Admitting: *Deleted

## 2015-01-31 DIAGNOSIS — Z5181 Encounter for therapeutic drug level monitoring: Secondary | ICD-10-CM

## 2015-01-31 DIAGNOSIS — I4891 Unspecified atrial fibrillation: Secondary | ICD-10-CM

## 2015-01-31 DIAGNOSIS — Z7901 Long term (current) use of anticoagulants: Secondary | ICD-10-CM

## 2015-01-31 LAB — POCT INR: INR: 2.5

## 2015-02-01 ENCOUNTER — Ambulatory Visit (INDEPENDENT_AMBULATORY_CARE_PROVIDER_SITE_OTHER): Payer: Medicare PPO

## 2015-02-01 ENCOUNTER — Ambulatory Visit: Payer: Medicare PPO

## 2015-02-01 DIAGNOSIS — J4540 Moderate persistent asthma, uncomplicated: Secondary | ICD-10-CM | POA: Diagnosis not present

## 2015-02-01 MED ORDER — OMALIZUMAB 150 MG ~~LOC~~ SOLR
300.0000 mg | Freq: Once | SUBCUTANEOUS | Status: AC
  Administered 2015-02-01: 300 mg via SUBCUTANEOUS

## 2015-02-02 ENCOUNTER — Telehealth: Payer: Self-pay | Admitting: Internal Medicine

## 2015-02-02 ENCOUNTER — Telehealth: Payer: Self-pay | Admitting: Pulmonary Disease

## 2015-02-02 NOTE — Telephone Encounter (Signed)
Pharm. Called to set up del. Today.  # vials:2 Ordered date:02/02/15 Shipping Date:02/28/15  We set del. For 03/01/15 if it doesn't come in I will call the Pharm. back to let them know.

## 2015-02-02 NOTE — Telephone Encounter (Signed)
Called by Standley Dakins regarding her aunt, [name] who has been on Bactrim to cough and congestion. Now patient has finished her Bactrim and her cough and congestion persists. BP reported to be 85/50. Patient instructed to come to ED for evaluation and probable hospital admission as hypotension is related to dehydration and/or sepsis.

## 2015-02-22 DIAGNOSIS — N183 Chronic kidney disease, stage 3 (moderate): Secondary | ICD-10-CM | POA: Diagnosis not present

## 2015-02-22 DIAGNOSIS — I10 Essential (primary) hypertension: Secondary | ICD-10-CM | POA: Diagnosis not present

## 2015-02-22 DIAGNOSIS — D631 Anemia in chronic kidney disease: Secondary | ICD-10-CM | POA: Diagnosis not present

## 2015-02-22 DIAGNOSIS — N2581 Secondary hyperparathyroidism of renal origin: Secondary | ICD-10-CM | POA: Diagnosis not present

## 2015-02-27 DIAGNOSIS — I10 Essential (primary) hypertension: Secondary | ICD-10-CM | POA: Diagnosis not present

## 2015-02-27 DIAGNOSIS — N2581 Secondary hyperparathyroidism of renal origin: Secondary | ICD-10-CM | POA: Diagnosis not present

## 2015-02-27 DIAGNOSIS — D631 Anemia in chronic kidney disease: Secondary | ICD-10-CM | POA: Diagnosis not present

## 2015-02-27 DIAGNOSIS — N183 Chronic kidney disease, stage 3 (moderate): Secondary | ICD-10-CM | POA: Diagnosis not present

## 2015-03-01 ENCOUNTER — Other Ambulatory Visit: Payer: Self-pay | Admitting: Internal Medicine

## 2015-03-01 ENCOUNTER — Telehealth: Payer: Self-pay | Admitting: Internal Medicine

## 2015-03-01 NOTE — Telephone Encounter (Signed)
#   Vials:2 Arrival Date:03/01/15 Lot [id number] Exp Date:8/19

## 2015-03-05 ENCOUNTER — Ambulatory Visit (INDEPENDENT_AMBULATORY_CARE_PROVIDER_SITE_OTHER): Payer: Medicare PPO

## 2015-03-05 DIAGNOSIS — J4520 Mild intermittent asthma, uncomplicated: Secondary | ICD-10-CM

## 2015-03-05 NOTE — Telephone Encounter (Signed)
CY Please advise on refill. Pt is current with OV's. Thanks.

## 2015-03-05 NOTE — Telephone Encounter (Signed)
Ok to refill 

## 2015-03-06 MED ORDER — OMALIZUMAB 150 MG ~~LOC~~ SOLR
300.0000 mg | Freq: Once | SUBCUTANEOUS | Status: AC
  Administered 2015-03-05: 300 mg via SUBCUTANEOUS

## 2015-03-07 ENCOUNTER — Ambulatory Visit (INDEPENDENT_AMBULATORY_CARE_PROVIDER_SITE_OTHER): Payer: Medicare PPO | Admitting: *Deleted

## 2015-03-07 DIAGNOSIS — I4891 Unspecified atrial fibrillation: Secondary | ICD-10-CM

## 2015-03-07 DIAGNOSIS — Z5181 Encounter for therapeutic drug level monitoring: Secondary | ICD-10-CM

## 2015-03-07 DIAGNOSIS — Z7901 Long term (current) use of anticoagulants: Secondary | ICD-10-CM | POA: Diagnosis not present

## 2015-03-07 LAB — POCT INR: INR: 3.8

## 2015-03-07 NOTE — Telephone Encounter (Signed)
Called pharm and refilled tramadol 50 mg # 20 no RF

## 2015-03-15 ENCOUNTER — Ambulatory Visit (INDEPENDENT_AMBULATORY_CARE_PROVIDER_SITE_OTHER): Payer: Medicare PPO | Admitting: Internal Medicine

## 2015-03-15 ENCOUNTER — Encounter: Payer: Self-pay | Admitting: Internal Medicine

## 2015-03-15 VITALS — BP 116/76 | HR 100 | Ht 66.5 in | Wt 160.0 lb

## 2015-03-15 DIAGNOSIS — I482 Chronic atrial fibrillation, unspecified: Secondary | ICD-10-CM

## 2015-03-15 DIAGNOSIS — J449 Chronic obstructive pulmonary disease, unspecified: Secondary | ICD-10-CM

## 2015-03-15 DIAGNOSIS — G4733 Obstructive sleep apnea (adult) (pediatric): Secondary | ICD-10-CM

## 2015-03-15 DIAGNOSIS — J45901 Unspecified asthma with (acute) exacerbation: Secondary | ICD-10-CM | POA: Diagnosis not present

## 2015-03-15 DIAGNOSIS — J00 Acute nasopharyngitis [common cold]: Secondary | ICD-10-CM | POA: Diagnosis not present

## 2015-03-15 MED ORDER — METHYLPREDNISOLONE ACETATE 80 MG/ML IJ SUSP
80.0000 mg | Freq: Once | INTRAMUSCULAR | Status: AC
  Administered 2015-03-15: 80 mg via INTRAMUSCULAR

## 2015-03-15 NOTE — Patient Instructions (Signed)
Depo 80  Please use a cane- We don't want you to fall and hurt yourself !

## 2015-03-15 NOTE — Progress Notes (Signed)
Patient ID: [name], female    DOB: 1934-02-09, 81 y.o.   MRN: [id number]  HPI 7/113/12-  76 yoF former smoker, followed for Chronic asthma/ COPD, chronic rhinosinusitis, complicated by hx AFib/ anticoagulation, CHF, cardiomyopathy, mitral insufficiency, GERD Last here December 11/19/10- note reviewed She has felt well controlled with no acute breathing issues. She saw Dr Domenic Polite for cardiac f/u 2 weeks ago and Echo was good with no med changes. Uses rescue inhaler only occasionally, usually on first rising. Little wheeze at times, much better. Little DOE with ADLs PFT 12/06/10= chronic obstructive asthma   7/113/12-  76 yoF former smoker, followed for Chronic asthma/ COPD, chronic rhinosinusitis, complicated by hx AFib/ anticoagulation, CHF, cardiomyopathy, mitral insufficiency, GERD Has had flu vaccine. Persistent head and chest congestion for the past 2 weeks. On November 7 we sent  Avelox for 5 days. This helped but didn't last long enough. She took a leftover prednisone taper but wants to avoid more prednisone now if possible. She has noted mild tremor in her hands for the past month no lateralizing finding. She continues Xolair injections. Using nebulizer or rescue inhaler only every 1 or 2 days. Has lost some weight and admits she has been dieting some but otherwise she's not sure that explains it. Thyroid function test normal in the past.  11/27/11- 76 yoF former smoker, followed for Chronic asthma/ COPD, chronic rhinosinusitis, complicated by hx AFib/ anticoagulation, CHF, cardiomyopathy, mitral insufficiency, GERD, Lung nodules Continues to do well. Continues Xolair. She thyroid nodule biopsy by interventional radiology. Waiting for results. Eyes have been watering some increased nasal drainage. We discussed possibility of seasonal allergic symptoms when she is getting Xolair. CT chest 10/20/2011-bilateral nodules less than 10 mm. Needs followup CT scan in May.  02/05/12- 76 yoF  former smoker, followed for Chronic asthma/ COPD, chronic rhinosinusitis, complicated by hx AFib/ anticoagulation, CHF, cardiomyopathy, mitral insufficiency, GERD, Lung nodules Denies any cough, congestion, SOB, or wheezing, Slight flare up with allergies at this time and still on Xolair. CAT score 8 She is pleased to be holding her current weight. Sometimes has a little wheeze but not much rarely needs a rescue inhaler and uses her nebulizer 2 or 3 times per week. Continues Xolair and believes it helps. Denies reflux or heartburn. CT chest  01/29/12- images reviewed with her. IMPRESSION:  1. Scattered bilateral pulmonary nodules are unchanged in size  from 10/22/2011. Additional follow-up CT chest without contrast  could be performed in 6 months to ensure continued stability, as  clinically indicated. This recommendation follows the consensus  statement: Guidelines for Management of Small Pulmonary Nodules  Detected on CT Scans: A Statement from the [facility] as  published in Radiology 2005; 237:395-400. If multidisciplinary  follow up management is desired, this is available in the [facility] through the [facility] [phone]  [phone].  2. Dominant right thyroid nodule. Consider further evaluation with  thyroid ultrasound. If patient is clinically hyperthyroid,  consider nuclear medicine thyroid uptake and scan.  Original Report Authenticated By: Luretha Rued, M.D.    08/09/12- 37 yoF former smoker, followed for Chronic asthma/ COPD, chronic rhinosinusitis, complicated by hx AFib/ anticoagulation, CHF, cardiomyopathy, mitral insufficiency, GERD, Lung nodules FOLLOWS FOR: denies any SOB, wheezing or cough. States she is having chest/throat congestion. For 2 weeks has had "congestion" in throat and chest. Dr. Hilma Favors treated Levaquin and it one week ago. She denies purulent discharge, fever or wheeze. Echocardiogram 05/03/2012-further improved. EF  60-65% with mitral regurgitation and mitral valve prolapse. Chest refill Valium use occasionally. CT 08/04/12-reviewed with her IMPRESSION:  1. Stable bilateral pulmonary nodules. No new pulmonary nodules  are noted. A follow-up examination in 1 year is recommended to  assure stability.  2. Stable 2 cm low density nodule in the right lobe of the thyroid  gland.  3. No acute infiltrate or pulmonary edema. Small hiatal hernia  again noted.  Original Report Authenticated By: Lahoma Crocker, M.D.  02/07/13- 67 yoF former smoker, followed for Chronic asthma/ COPD, chronic rhinosinusitis, complicated by hx AFib/ anticoagulation, CHF, cardiomyopathy, mitral insufficiency, GERD, Lung nodules FOLLOWS FOR: still on Xolair; drainage in throat. Denies any flare ups with asthma or allergies at this time. Taking Zyrtec. Some postnasal drip. Had thyroid nodule biopsied.  08/11/13- 79 yoF former smoker, followed for Chronic asthma/ COPD, chronic rhinosinusitis, complicated by hx AFib/ anticoagulation, CHF, cardiomyopathy, mitral insufficiency, GERD, Lung nodules FOLLOWS FOR: has had shingles for aobut 4 weeks; still on Xolair and plans to get injection for December as she did not take November's injection. Shingles right lateral rib cage- valacyclovir Rx'd. Still hurts. It is not needing oxygen. CT 07/15/13  IMPRESSION:  1. No acute cardiopulmonary abnormalities.  2. Stable nodules from 10/22/2011. These are unchanged for almost 19  months and are likely benign.  3. Atherosclerotic disease.  4. Hiatal hernia status post fundoplication.  Electronically Signed  By: Kerby Moors M.D.  On: 07/14/2013 13:04  12/01/13- 79 yoF former smoker, followed for Chronic asthma/ COPD, chronic rhinosinusitis, complicated by hx AFib/ anticoagulation, CHF, cardiomyopathy, mitral insufficiency, GERD, Lung nodules ACUTE VISIT: stuffy head, coughing so hard (mainly at night) that she isnt sleeping. Productive at times-white in  color. Due for Xolair today however working with Insurance to approve. Ended prednisone 10 mg daily maintenance 3 weeks ago. Since then has felt more washed out and tired. Had lab work indicating no heart failure, but kidney function poor> Nephrology. Increased nasal congestion. Cough keeps her awake. Some wheeze. Using nebulizer 3 or 4 times daily, Xolair on schedule.  01/31/14- 79 yoF former smoker, followed for Chronic asthma/ COPD, chronic rhinosinusitis, Lung nodules complicated by hx AFib/ anticoagulation, CHF, cardiomyopathy, mitral insufficiency, GERD, CKD FOLLOWS FOR: Still on Xolair and doing well; no flare ups and no cough Says she is doing "great" now with no acute symptoms.  03/30/14-  79 yoF former smoker, followed for Chronic asthma/ COPD, chronic rhinosinusitis, Lung nodules complicated by hx AFib/ anticoagulation, CHF, cardiomyopathy, mitral insufficiency, GERD, CKD ACUTE VISIT: increased cough; SOB and wheezing. Had her Xolair injection on Friday 03-24-14 Recently on Avelox for 7 days for sinus infection from  PCP office as well as short dose of Pred . Does not feel sick. Sinusitis resolved. No headache. When she coughs her nose runs copious white clear mucus.  06/05/14-  80 yoF former smoker, followed for Chronic asthma/ COPD, chronic rhinosinusitis, Lung nodules complicated by hx AFib/ anticoagulation, CHF, cardiomyopathy, mitral insufficiency, GERD, CKD     Xolair FOLLOWS FOR: still on Xolair and doing well on it. Flu shot today as well. Albuterol neb needs ot go through [employer] from now on. Minor allergy symptoms this summer with watery eyes, but nothing bad. Feels very well. Admits on questioning that she does notice a little wheeze. Uses nebulizer machine occasionally.  11/13/14-80 yoF former smoker, followed for Chronic asthma/ COPD, chronic rhinosinusitis, Lung nodules complicated by hx AFib/ anticoagulation, CHF, cardiomyopathy, mitral insufficiency, GERD,  CKD FOLLOW FOR:  Asthma- Xolair working well for her.  breathing not too bad today, little congestion.  no concerns today. Breathing feels good. Blames allergy for irritated eyes x 3 weeks- on Zyrtec now. Stopped prednisone 2 weeks ago  03/15/15- 80 yoF former smoker, followed for Chronic asthma/ COPD, chronic rhinosinusitis, Lung nodules complicated by hx AFib/ anticoagulation, CHF, cardiomyopathy, mitral insufficiency, GERD, CKD Reports: Continues xolair inj; asthma; SOB doing better since BREO;  Prod cough w/white mucus; yellow nasal drip started this am; drainage Denies headache, fever, sore throat, wheeze or chest tightness We reviewed x-ray images CXR 01/16/15- IMPRESSION: COPD without acute superimposed disease. Electronically Signed  By: Monte Fantasia M.D.  On: 01/16/2015 12:40  Review of Systems-see HPI Constitutional:    weight loss, night sweats, fevers, chills, +fatigue, lassitude. HEENT:   No-  headaches, difficulty swallowing, tooth/dental problems, sore throat,       No-  sneezing, itching, ear ache, no- nasal congestion, +post nasal drip,  CV:  No- chest pain, no-orthopnea, PND, swelling in lower extremities, anasarca, dizziness, palpitations Resp: + acute  shortness of breath with exertion or at rest.              No-   productive cough,  +non-productive cough,  No- coughing up of blood.              No-   change in color of mucus. + wheezing.   Skin: No-   rash or lesions. GI:  No-   heartburn, indigestion, abdominal pain, nausea, vomiting,  GU: . MS:  No-   joint pain or swelling.  Neuro-     nothing unusual Psych:  No- change in mood or affect. No depression or anxiety.  No memory loss.  Objective:   Physical Exam General- Alert, Oriented, Affect-appropriate/ cheerful, Distress- none acute, thin Skin- no rash visible now on right lateral ribs Lymphadenopathy- none Head- atraumatic            Eyes- Gross vision intact, PERRLA, +conjunctivae injected             Ears- Hearing, canals-normal            Nose- clear, turbinate edema, no-Septal dev, mucus, polyps, erosion, perforation             Throat- Mallampati II , mucosa clear/ not red , drainage- none, tonsils- atrophic,  dentures.+ Mild hoarseness .+ Large torus hard palate Neck- flexible , trachea midline, no stridor , thyroid nl, carotid no bruit Chest - symmetrical excursion , unlabored           Heart/CV- +IRR/ AFib well controlled , + faint ? Mitral regurg murmur , no gallop  ,                               no rub, nl s1 s2                           - JVD- none , edema- none, stasis changes- none, varices- none           Lung-  + mild bilateral rhonchi, unlabored,  Cough+light , dullness-none, rub- none           Chest wall-  Abd-  Br/ Gen/ Rectal- Not done, not indicated Extrem- cyanosis- none, clubbing, none, atrophy- none, strength- nl Neuro- slight resting tremor

## 2015-03-18 DIAGNOSIS — J310 Chronic rhinitis: Secondary | ICD-10-CM | POA: Insufficient documentation

## 2015-03-18 DIAGNOSIS — J00 Acute nasopharyngitis [common cold]: Secondary | ICD-10-CM | POA: Insufficient documentation

## 2015-03-18 NOTE — Assessment & Plan Note (Signed)
Exam indicates controlled ventricular response rate at this visit

## 2015-03-18 NOTE — Assessment & Plan Note (Signed)
Faint rhonchi are audible, suggesting an ongoing bronchitis or asthmatic bronchitis. She does not feel sick. Plan-Depo-Medrol

## 2015-03-18 NOTE — Assessment & Plan Note (Signed)
Increased yellow nasal discharge and postnasal drip noticed this morning. Early viral infection or allergic response to an unknown trigger. Plan-fluids and supportive care, Depo-Medrol

## 2015-03-28 ENCOUNTER — Ambulatory Visit (INDEPENDENT_AMBULATORY_CARE_PROVIDER_SITE_OTHER): Payer: Medicare PPO | Admitting: *Deleted

## 2015-03-28 DIAGNOSIS — Z5181 Encounter for therapeutic drug level monitoring: Secondary | ICD-10-CM

## 2015-03-28 DIAGNOSIS — I4891 Unspecified atrial fibrillation: Secondary | ICD-10-CM

## 2015-03-28 DIAGNOSIS — Z7901 Long term (current) use of anticoagulants: Secondary | ICD-10-CM | POA: Diagnosis not present

## 2015-03-28 LAB — POCT INR: INR: 3.6

## 2015-03-30 ENCOUNTER — Other Ambulatory Visit: Payer: Self-pay | Admitting: Internal Medicine

## 2015-04-03 ENCOUNTER — Telehealth: Payer: Self-pay | Admitting: Internal Medicine

## 2015-04-03 NOTE — Telephone Encounter (Signed)
#   vials:2 Ordered date:04/03/15 Shipping Date:04/03/15 03/28/15 I called to set up ship. They had to get pt.'s consent. 04/03/15 pharm. Had pt's consent set up ship. 04/04/15. Called pt. To ask her to schedule her appt. For later that day to be on the safe side.

## 2015-04-04 ENCOUNTER — Ambulatory Visit: Payer: Medicare PPO

## 2015-04-04 ENCOUNTER — Ambulatory Visit (INDEPENDENT_AMBULATORY_CARE_PROVIDER_SITE_OTHER): Payer: Medicare PPO

## 2015-04-04 ENCOUNTER — Telehealth: Payer: Self-pay | Admitting: Internal Medicine

## 2015-04-04 DIAGNOSIS — J4540 Moderate persistent asthma, uncomplicated: Secondary | ICD-10-CM

## 2015-04-04 MED ORDER — TRAMADOL HCL 50 MG PO TABS: 50.0000 mg | ORAL_TABLET | Freq: Four times a day (QID) | ORAL | Status: DC | PRN

## 2015-04-04 NOTE — Telephone Encounter (Signed)
Pt requesting refill on Tramadol. Last refill 05/09/14- #90 with 5 refills, to take 1 tab q4h prn.  CY please advise if ok to refill.  Thanks!

## 2015-04-04 NOTE — Telephone Encounter (Signed)
Ok to refill 

## 2015-04-04 NOTE — Telephone Encounter (Signed)
Refill sent per Dr Annamaria Boots.

## 2015-04-04 NOTE — Telephone Encounter (Signed)
#   Vials:2 Arrival Date:04/04/15 Lot [id number] Exp Date:8/19

## 2015-04-04 NOTE — Telephone Encounter (Signed)
Pt is requesting a refill on Ultracet. Last OV 03/15/15 Pending OV 07/16/15 Last refill 05/09/14 #90 1 q4h prn  CY - please advise on refill. Thanks.

## 2015-04-05 MED ORDER — OMALIZUMAB 150 MG ~~LOC~~ SOLR
300.0000 mg | Freq: Once | SUBCUTANEOUS | Status: AC
  Administered 2015-04-04: 300 mg via SUBCUTANEOUS

## 2015-04-05 NOTE — Telephone Encounter (Signed)
Ok to refill 

## 2015-04-11 ENCOUNTER — Ambulatory Visit (INDEPENDENT_AMBULATORY_CARE_PROVIDER_SITE_OTHER): Payer: Medicare PPO | Admitting: *Deleted

## 2015-04-11 DIAGNOSIS — Z5181 Encounter for therapeutic drug level monitoring: Secondary | ICD-10-CM

## 2015-04-11 DIAGNOSIS — Z7901 Long term (current) use of anticoagulants: Secondary | ICD-10-CM

## 2015-04-11 DIAGNOSIS — I4891 Unspecified atrial fibrillation: Secondary | ICD-10-CM

## 2015-04-11 LAB — POCT INR: INR: 3.1

## 2015-04-26 DIAGNOSIS — E876 Hypokalemia: Secondary | ICD-10-CM | POA: Diagnosis not present

## 2015-04-26 DIAGNOSIS — I10 Essential (primary) hypertension: Secondary | ICD-10-CM | POA: Diagnosis not present

## 2015-04-26 DIAGNOSIS — N2581 Secondary hyperparathyroidism of renal origin: Secondary | ICD-10-CM | POA: Diagnosis not present

## 2015-04-26 DIAGNOSIS — N183 Chronic kidney disease, stage 3 (moderate): Secondary | ICD-10-CM | POA: Diagnosis not present

## 2015-04-26 DIAGNOSIS — R809 Proteinuria, unspecified: Secondary | ICD-10-CM | POA: Diagnosis not present

## 2015-04-27 ENCOUNTER — Telehealth: Payer: Self-pay | Admitting: Internal Medicine

## 2015-04-27 NOTE — Telephone Encounter (Signed)
#   vials:2 Ordered date:04/27/15 Shipping Date:05/01/15

## 2015-05-01 ENCOUNTER — Ambulatory Visit (INDEPENDENT_AMBULATORY_CARE_PROVIDER_SITE_OTHER): Payer: Medicare PPO | Admitting: *Deleted

## 2015-05-01 DIAGNOSIS — Z5181 Encounter for therapeutic drug level monitoring: Secondary | ICD-10-CM

## 2015-05-01 DIAGNOSIS — Z7901 Long term (current) use of anticoagulants: Secondary | ICD-10-CM | POA: Diagnosis not present

## 2015-05-01 DIAGNOSIS — I4891 Unspecified atrial fibrillation: Secondary | ICD-10-CM | POA: Diagnosis not present

## 2015-05-01 LAB — POCT INR: INR: 2

## 2015-05-02 ENCOUNTER — Telehealth: Payer: Self-pay | Admitting: Internal Medicine

## 2015-05-02 NOTE — Telephone Encounter (Signed)
#   Vials:2 Arrival Date:05/02/15 Lot [id number]  Exp Date:12/19

## 2015-05-03 NOTE — Telephone Encounter (Signed)
Created in error

## 2015-05-07 ENCOUNTER — Ambulatory Visit (INDEPENDENT_AMBULATORY_CARE_PROVIDER_SITE_OTHER): Payer: Medicare PPO

## 2015-05-07 DIAGNOSIS — J4540 Moderate persistent asthma, uncomplicated: Secondary | ICD-10-CM

## 2015-05-10 DIAGNOSIS — J4540 Moderate persistent asthma, uncomplicated: Secondary | ICD-10-CM | POA: Diagnosis not present

## 2015-05-10 MED ORDER — OMALIZUMAB 150 MG ~~LOC~~ SOLR
300.0000 mg | Freq: Once | SUBCUTANEOUS | Status: AC
  Administered 2015-05-07: 300 mg via SUBCUTANEOUS

## 2015-05-16 ENCOUNTER — Other Ambulatory Visit: Payer: Self-pay | Admitting: Cardiology

## 2015-05-23 ENCOUNTER — Ambulatory Visit (INDEPENDENT_AMBULATORY_CARE_PROVIDER_SITE_OTHER): Payer: Medicare PPO | Admitting: *Deleted

## 2015-05-23 DIAGNOSIS — I4891 Unspecified atrial fibrillation: Secondary | ICD-10-CM | POA: Diagnosis not present

## 2015-05-23 DIAGNOSIS — Z7901 Long term (current) use of anticoagulants: Secondary | ICD-10-CM

## 2015-05-23 DIAGNOSIS — Z5181 Encounter for therapeutic drug level monitoring: Secondary | ICD-10-CM

## 2015-05-23 LAB — POCT INR: INR: 1.6

## 2015-05-25 ENCOUNTER — Telehealth: Payer: Self-pay

## 2015-05-25 NOTE — Telephone Encounter (Signed)
Attempted to order pt's Xolair rx for the month, per Lisabeth Devoid at [facility] they have not been successful in contacting pt to get approval for our office to order this medication on her behalf.  We cannot order pt's medication until she contacts [facility] at [phone]. Verified that [employer] has the correct number on file for pt. lmtcb X1 for pt to make her aware of this.  Will forward this message to Tammy Scott's box for when she returns.   Will also forward to Katie to make aware as allergy lead.

## 2015-05-28 NOTE — Telephone Encounter (Signed)
Called pt. She received your message [name], she just forgot to call. I was able to reach her today, she said she would call the pharm.. I told her I would  call them back to order later today. Nothing further needed.

## 2015-05-29 ENCOUNTER — Telehealth: Payer: Self-pay | Admitting: Internal Medicine

## 2015-05-29 DIAGNOSIS — Z23 Encounter for immunization: Secondary | ICD-10-CM | POA: Diagnosis not present

## 2015-05-29 NOTE — Telephone Encounter (Signed)
#   vials:2 Ordered date:05/29/15 Shipping Date:05/29/15  [employer] called and set up del. 05/30/15.

## 2015-05-30 NOTE — Telephone Encounter (Signed)
#   Vials:2 Arrival Date:05/30/15 Lot [id number]  Exp Date:12/19

## 2015-06-06 ENCOUNTER — Ambulatory Visit (INDEPENDENT_AMBULATORY_CARE_PROVIDER_SITE_OTHER): Payer: Medicare PPO | Admitting: *Deleted

## 2015-06-06 DIAGNOSIS — Z7901 Long term (current) use of anticoagulants: Secondary | ICD-10-CM

## 2015-06-06 DIAGNOSIS — Z5181 Encounter for therapeutic drug level monitoring: Secondary | ICD-10-CM | POA: Diagnosis not present

## 2015-06-06 DIAGNOSIS — I4891 Unspecified atrial fibrillation: Secondary | ICD-10-CM

## 2015-06-06 LAB — POCT INR: INR: 1.8

## 2015-06-07 ENCOUNTER — Ambulatory Visit: Payer: Medicare PPO

## 2015-06-11 ENCOUNTER — Ambulatory Visit (INDEPENDENT_AMBULATORY_CARE_PROVIDER_SITE_OTHER): Payer: Medicare PPO

## 2015-06-11 ENCOUNTER — Telehealth: Payer: Self-pay | Admitting: Internal Medicine

## 2015-06-11 DIAGNOSIS — J4540 Moderate persistent asthma, uncomplicated: Secondary | ICD-10-CM | POA: Diagnosis not present

## 2015-06-11 MED ORDER — OMALIZUMAB 150 MG ~~LOC~~ SOLR
300.0000 mg | Freq: Once | SUBCUTANEOUS | Status: AC
  Administered 2015-06-11: 300 mg via SUBCUTANEOUS

## 2015-06-11 NOTE — Telephone Encounter (Signed)
lmtcb X1 for pt's E.C.  Forwarding to Joellen Jersey to look out for.

## 2015-06-12 NOTE — Telephone Encounter (Signed)
Form filled out to the best of my ability and left on CY's cart to fill out.  Forwarding to Katie to follow up on.

## 2015-06-12 NOTE — Telephone Encounter (Signed)
Pt is aware that Handicap Placard has been completed and at front for pick up. She also understands that she will need to let the [facility] know she requests to have 2 placards. Nothing more needed at this time.

## 2015-06-12 NOTE — Telephone Encounter (Signed)
Done

## 2015-06-12 NOTE — Telephone Encounter (Signed)
Return call can be reached @ [phone] and it is ok to leave msg on phone.Stanley A Dalton \

## 2015-06-14 ENCOUNTER — Other Ambulatory Visit: Payer: Self-pay | Admitting: Cardiology

## 2015-06-19 ENCOUNTER — Telehealth: Payer: Self-pay | Admitting: Internal Medicine

## 2015-06-19 MED ORDER — PREDNISONE 10 MG (21) PO TBPK: 10.0000 mg | ORAL_TABLET | Freq: Every day | ORAL | Status: DC

## 2015-06-19 NOTE — Telephone Encounter (Signed)
Spoke with Willow Ora, pt's niece, requesting a pred taper for pt.  Pt c/o chest congestion, wheezing, sinus congestion, PND X 1 week.  Denies fever.  Pt has not taken anything aside from her maintenance meds to help with s/s. Pt uses [facility] [address]   Sending to doc of the day as CY is out this week.  SN please advise.  Thanks!

## 2015-06-19 NOTE — Telephone Encounter (Signed)
Per SN,  Call in Sterapred 1mf 6 day dose pak to pharmacy Informed pt to call back if not better  Called and spoke with pt's niece and informed of the above Niece voiced understanding of instructions  Medication sent electronically to pt's pharmacy in [place]  Nothing further is needed

## 2015-06-20 ENCOUNTER — Ambulatory Visit (INDEPENDENT_AMBULATORY_CARE_PROVIDER_SITE_OTHER): Payer: Medicare PPO | Admitting: *Deleted

## 2015-06-20 DIAGNOSIS — Z7901 Long term (current) use of anticoagulants: Secondary | ICD-10-CM | POA: Diagnosis not present

## 2015-06-20 DIAGNOSIS — I4891 Unspecified atrial fibrillation: Secondary | ICD-10-CM | POA: Diagnosis not present

## 2015-06-20 DIAGNOSIS — Z5181 Encounter for therapeutic drug level monitoring: Secondary | ICD-10-CM

## 2015-06-20 LAB — POCT INR: INR: 2.4

## 2015-06-26 ENCOUNTER — Telehealth: Payer: Self-pay | Admitting: Internal Medicine

## 2015-06-26 NOTE — Telephone Encounter (Signed)
#   vials:2 Ordered date:06-26-15 Shipping Date:07-03-15

## 2015-06-29 ENCOUNTER — Telehealth: Payer: Self-pay | Admitting: Internal Medicine

## 2015-06-29 MED ORDER — AZELASTINE HCL 0.1 % NA SOLN: 1.0000 | Freq: Two times a day (BID) | NASAL | Status: DC

## 2015-06-29 NOTE — Telephone Encounter (Signed)
Spoke with Willow Ora (EC), aware of recs.  Medication sent to pharmacy.  Nothing further needed.

## 2015-06-29 NOTE — Telephone Encounter (Signed)
If she can take sudafed (from pharmacy), then suggest trying 1 each morning for a few days to see if it helps the pressure.  Can also offer Rx astelin nasal spray    1-2 puffs each nostril once or twice daily as needed to reduce post nasal drip     Refill prn

## 2015-06-29 NOTE — Telephone Encounter (Signed)
Called and spoke to pt. Pt c/o increase prod cough with clear mucus, hoarseness, PND, frontal and maxillary sinus pressure x 2 months. Pt completed a pred taper last week with not improvement in s/s listed above, pt only had an increase in energy. Pt denies CP/tightness and f/c/s. Pt upcoming appt with CY on 11.7.33.   Dr. Annamaria Boots please advise. Thanks.   Allergies  Allergen Reactions  . Amoxicillin-Pot Clavulanate   . Clarithromycin   . Clindamycin   . Doxycycline   . Penicillins     Current Outpatient Prescriptions on File Prior to Visit  Medication Sig Dispense Refill  . albuterol (PROVENTIL HFA;VENTOLIN HFA) 108 (90 BASE) MCG/ACT inhaler Inhale 2 puffs into the lungs every 6 (six) hours as needed for wheezing or shortness of breath. 1 Inhaler 11  . albuterol (PROVENTIL) (2.5 MG/3ML) 0.083% nebulizer solution Take 3 mLs (2.5 mg total) by nebulization every 4 (four) hours as needed for wheezing or shortness of breath. 360 mL 3  . atorvastatin (LIPITOR) 10 MG tablet Take 10 mg by mouth daily.      . Calcium Carbonate-Vitamin D (CALTRATE 600+D) 600-400 MG-UNIT per tablet Take 1 tablet by mouth daily.      . cetirizine (ZYRTEC) 10 MG tablet Take 10 mg by mouth daily.    . citalopram (CELEXA) 40 MG tablet Take 1 tablet by mouth daily.    . diazepam (VALIUM) 2 MG tablet Take 2 mg by mouth 3 (three) times daily as needed for anxiety.    . digoxin (LANOXIN) 0.125 MG tablet TAKE 1 TABLET EVERY DAY 90 tablet 3  . diltiazem (CARDIZEM CD) 120 MG 24 hr capsule Take 120 mg by mouth daily.    Marland Kitchen EPINEPHrine (EPI-PEN) 0.3 MG/0.3ML DEVI Inject 0.3 mg into the muscle as needed.      Marland Kitchen esomeprazole (NEXIUM) 40 MG capsule Take 40 mg by mouth 2 (two) times daily.      . Fluticasone Furoate-Vilanterol (BREO ELLIPTA) 200-25 MCG/INH AEPB Inhale 1 puff into the lungs daily. Rinse mouth 60 each 11  . furosemide (LASIX) 40 MG tablet TAKE 1 TABLET EVERY DAY 90 tablet 3  . mometasone (NASONEX) 50 MCG/ACT nasal spray  Place 2 sprays into the nose as needed (congestion/allergies).     . Olopatadine HCl (PAZEO) 0.7 % SOLN Apply 1 drop to eye daily. 1 Bottle 0  . omalizumab (XOLAIR) 150 MG injection Inject 300 mg into the skin every 28 (twenty-eight) days. 2 vial 2  . potassium chloride SA (KLOR-CON M20) 20 MEQ tablet Take 1 tablet (20 mEq total) by mouth daily. 30 tablet 6  . predniSONE (STERAPRED UNI-PAK 21 TAB) 10 MG (21) TBPK tablet Take 1 tablet (10 mg total) by mouth daily. 14 tablet 0  . traMADol (ULTRAM) 50 MG tablet TAKE 1 TABLET BY MOUTH EVERY 8 HOURS AS NEEDED 90 tablet 5  . traMADol (ULTRAM) 50 MG tablet Take 1 tablet (50 mg total) by mouth every 6 (six) hours as needed. 90 tablet 2  . warfarin (COUMADIN) 5 MG tablet TAKE 1 TABLET (5MG ) EVERY DAY EXCEPT ON WEDNESDAYS TAKE 1 AND 1/2 TABLETS (7.5MG ) 98 tablet 3   No current facility-administered medications on file prior to visit.

## 2015-07-03 ENCOUNTER — Other Ambulatory Visit: Payer: Self-pay

## 2015-07-03 DIAGNOSIS — I340 Nonrheumatic mitral (valve) insufficiency: Secondary | ICD-10-CM

## 2015-07-03 DIAGNOSIS — I059 Rheumatic mitral valve disease, unspecified: Secondary | ICD-10-CM

## 2015-07-03 NOTE — Telephone Encounter (Signed)
#   Vials:2 Arrival Date:07/03/15 Lot [id number] Exp Date:1/20

## 2015-07-04 ENCOUNTER — Encounter: Payer: Self-pay | Admitting: Cardiology

## 2015-07-04 ENCOUNTER — Ambulatory Visit (HOSPITAL_COMMUNITY)
Admission: RE | Admit: 2015-07-04 | Discharge: 2015-07-04 | Disposition: A | Discharge status: Home / Self Care | Payer: Medicare PPO | Source: Ambulatory Visit | Attending: Cardiology | Admitting: Cardiology

## 2015-07-04 ENCOUNTER — Ambulatory Visit (INDEPENDENT_AMBULATORY_CARE_PROVIDER_SITE_OTHER): Payer: Medicare PPO | Admitting: Cardiology

## 2015-07-04 VITALS — BP 118/74 | HR 102 | Ht 66.0 in | Wt 168.6 lb

## 2015-07-04 DIAGNOSIS — I059 Rheumatic mitral valve disease, unspecified: Secondary | ICD-10-CM

## 2015-07-04 DIAGNOSIS — I429 Cardiomyopathy, unspecified: Secondary | ICD-10-CM

## 2015-07-04 DIAGNOSIS — I482 Chronic atrial fibrillation, unspecified: Secondary | ICD-10-CM

## 2015-07-04 DIAGNOSIS — I340 Nonrheumatic mitral (valve) insufficiency: Secondary | ICD-10-CM | POA: Diagnosis not present

## 2015-07-04 DIAGNOSIS — J449 Chronic obstructive pulmonary disease, unspecified: Secondary | ICD-10-CM | POA: Diagnosis not present

## 2015-07-04 MED ORDER — DILTIAZEM HCL ER COATED BEADS 180 MG PO CP24: 180.0000 mg | ORAL_CAPSULE | Freq: Every day | ORAL | Status: DC

## 2015-07-04 NOTE — Progress Notes (Signed)
Cardiology Office Note  Date: 07/04/2015   ID: [name], DOB 03-Jun-1934, MRN [id number]  PCP: Purvis Kilts, MD  Primary Cardiologist: [name], MD   Chief Complaint  Patient presents with  . Cardiomyopathy  . Mitral regurgitation  . Atrial Fibrillation    History of Present Illness: [name] is a 81 y.o. female last seen in April. She presents with her daughter for a follow-up visit today. I reviewed her history. She continues to have intermittent URI flares, follows with Dr. Annamaria Boots. Also having trouble with chronic hoarseness which is being evaluated further. She has been on repeated courses of steroids.  Today heart rate is elevated at rest in atrial fibrillation. She had an echocardiogram done earlier today, I was able to review it very briefly. She has had reduction in LVEF to approximately 30% in the setting of rapid ventricular response I suspect. Mitral valve disease looks to be relatively stable. Full report is to follow.  She does not report any worsening dyspnea over her baseline, nor a sense of palpitations or chest pain. In the past she had a severe nonischemic cardiomyopathy associated with rapid atrial fibrillation.  Cardizem CD has been reduced from 240 mg to 120 mg daily since our last encounter. Patient indicates that this was done by her nephrologist based on low blood pressure.   Past Medical History  Diagnosis Date  . Asthma   . Atrial fibrillation ([place])   . COPD (chronic obstructive pulmonary disease) ([place])   . GERD (gastroesophageal reflux disease)   . Mitral valve prolapse   . Nonischemic cardiomyopathy ([facility])     LVEF 25-30% improved to 45-50%  . Chronic rhinitis   . Obstructive sleep apnea   . Transudative pleural effusion   . Nasal polyps   . Mitral regurgitation     Past Surgical History  Procedure Laterality Date  . Laparoscopic nissen fundoplication  [id number]  . Appendectomy  2000  . Nose surgery    . Bilateral  cataract surgery      Current Outpatient Prescriptions  Medication Sig Dispense Refill  . albuterol (PROVENTIL HFA;VENTOLIN HFA) 108 (90 BASE) MCG/ACT inhaler Inhale 2 puffs into the lungs every 6 (six) hours as needed for wheezing or shortness of breath. 1 Inhaler 11  . albuterol (PROVENTIL) (2.5 MG/3ML) 0.083% nebulizer solution Take 3 mLs (2.5 mg total) by nebulization every 4 (four) hours as needed for wheezing or shortness of breath. 360 mL 3  . atorvastatin (LIPITOR) 10 MG tablet Take 10 mg by mouth daily.      Marland Kitchen azelastine (ASTELIN) 0.1 % nasal spray Place 1-2 sprays into both nostrils 2 (two) times daily. Use in each nostril as directed 30 mL prn  . Calcium Carbonate-Vitamin D (CALTRATE 600+D) 600-400 MG-UNIT per tablet Take 1 tablet by mouth daily.      . cetirizine (ZYRTEC) 10 MG tablet Take 10 mg by mouth daily.    . citalopram (CELEXA) 40 MG tablet Take 1 tablet by mouth daily.    . diazepam (VALIUM) 2 MG tablet Take 2 mg by mouth 3 (three) times daily as needed for anxiety.    . digoxin (LANOXIN) 0.125 MG tablet TAKE 1 TABLET EVERY DAY 90 tablet 3  . diltiazem (CARDIZEM CD) 120 MG 24 hr capsule Take 120 mg by mouth daily.    Marland Kitchen EPINEPHrine (EPI-PEN) 0.3 MG/0.3ML DEVI Inject 0.3 mg into the muscle as needed.      Marland Kitchen esomeprazole (NEXIUM) 40 MG  capsule Take 40 mg by mouth 2 (two) times daily.      . Fluticasone Furoate-Vilanterol (BREO ELLIPTA) 200-25 MCG/INH AEPB Inhale 1 puff into the lungs daily. Rinse mouth 60 each 11  . furosemide (LASIX) 40 MG tablet TAKE 1 TABLET EVERY DAY 90 tablet 3  . mometasone (NASONEX) 50 MCG/ACT nasal spray Place 2 sprays into the nose as needed (congestion/allergies).     . Olopatadine HCl (PAZEO) 0.7 % SOLN Apply 1 drop to eye daily. 1 Bottle 0  . omalizumab (XOLAIR) 150 MG injection Inject 300 mg into the skin every 28 (twenty-eight) days. 2 vial 2  . potassium chloride SA (KLOR-CON M20) 20 MEQ tablet Take 1 tablet (20 mEq total) by mouth daily. 30  tablet 6  . traMADol (ULTRAM) 50 MG tablet TAKE 1 TABLET BY MOUTH EVERY 8 HOURS AS NEEDED 90 tablet 5  . traMADol (ULTRAM) 50 MG tablet Take 1 tablet (50 mg total) by mouth every 6 (six) hours as needed. 90 tablet 2  . warfarin (COUMADIN) 5 MG tablet TAKE 1 TABLET (5MG ) EVERY DAY EXCEPT ON WEDNESDAYS TAKE 1 AND 1/2 TABLETS (7.5MG ) 98 tablet 3   No current facility-administered medications for this visit.    Allergies:  Amoxicillin-pot clavulanate; Clarithromycin; Clindamycin; Doxycycline; and Penicillins   Social History: The patient  reports that she quit smoking about 34 years ago. Her smoking use included Cigarettes. She has a 10 pack-year smoking history. She has never used smokeless tobacco. She reports that she does not drink alcohol or use illicit drugs.   ROS:  Please see the history of present illness. Otherwise, complete review of systems is positive for hoarseness, chronic shortness of breath that has been stable. No significant weight change.  All other systems are reviewed and negative.   Physical Exam: VS:  BP 118/74 mmHg  Pulse 102  Ht 5\' 6"  (1.676 m)  Wt 168 lb 9.6 oz (76.476 kg)  BMI 27.23 kg/m2  SpO2 96%, BMI Body mass index is 27.23 kg/(m^2).  Wt Readings from Last 3 Encounters:  07/04/15 168 lb 9.6 oz (76.476 kg)  03/15/15 160 lb (72.576 kg)  01/03/15 165 lb (74.844 kg)     Elderly woman, appears comfortable at rest.  HEENT: Lids normal, conjunctiva normal, oropharynx clear.  Neck: Supple, no elevated JVP or carotid bruits, no thyromegaly.  Lungs: Scattered rhonchi with decreased breath sounds, no labored breathing.  Cardiac: Irregularly irregular, rapid, soft apical systolic murmur, no pericardial rub.  Abdomen: Soft, nontender, bowel sound present.  Skin: Warm and dry.  Extremities: No pitting edema below the knees.  Musculoskeletal: No kyphosis.  Neuropsychiatric: Alert and oriented x3, affect appropriate.   ECG: ECG is not ordered  today.   ASSESSMENT AND PLAN:  1. Chronic atrial fibrillation, rapid ventricular response noted at this time. This is most likely secondary to reduction in her Cardizem CD dose from 240 mg daily to 120 mg daily. She has a history of tachycardia-mediated nonischemic cardiomyopathy, and it is imperative that her heart rate is better controlled. We will increase Cardizem CD to 180 mg daily, follow blood pressure and heart rate response. May need to make further adjustments. Otherwise continue Coumadin.  2. Nonischemic cardiomyopathy. She has had reduction in LVEF based on echocardiogram done earlier today. I will follow-up with full report. We are going to make further efforts at controlling her heart rate as this has been effective in the past.  3. History of moderate to severe mitral regurgitation. Will review  echocardiogram further, although findings look fairly similar in terms of valvular disease.  4. COPD. Followed by Dr. Annamaria Boots.  Current medicines were reviewed at length with the patient today.  Disposition: FU with me in 2 weeks.   Signed, Satira Sark, MD, [facility] 07/04/2015 2:51 PM    [place] [facility] [address], [place], [place] [address] Phone: [phone]; Fax: [phone]

## 2015-07-04 NOTE — Patient Instructions (Signed)
Your physician recommends that you schedule a follow-up appointment in: 2 weeks with Dr. Domenic Polite  Your physician has recommended you make the following change in your medication:   Increase Cardizem CD 180 mg Daily   If you need a refill on your cardiac medications before your next appointment, please call your pharmacy.  Thank you for choosing [facility]!

## 2015-07-09 ENCOUNTER — Ambulatory Visit (INDEPENDENT_AMBULATORY_CARE_PROVIDER_SITE_OTHER): Payer: Medicare PPO | Admitting: *Deleted

## 2015-07-09 DIAGNOSIS — I4891 Unspecified atrial fibrillation: Secondary | ICD-10-CM

## 2015-07-09 DIAGNOSIS — Z5181 Encounter for therapeutic drug level monitoring: Secondary | ICD-10-CM | POA: Diagnosis not present

## 2015-07-09 LAB — POCT INR: INR: 2.5

## 2015-07-12 ENCOUNTER — Ambulatory Visit: Payer: Medicare PPO

## 2015-07-16 ENCOUNTER — Encounter: Payer: Self-pay | Admitting: Internal Medicine

## 2015-07-16 ENCOUNTER — Ambulatory Visit (INDEPENDENT_AMBULATORY_CARE_PROVIDER_SITE_OTHER)
Admission: RE | Admit: 2015-07-16 | Discharge: 2015-07-16 | Disposition: A | Discharge status: Home / Self Care | Payer: Medicare PPO | Source: Ambulatory Visit | Attending: Internal Medicine | Admitting: Internal Medicine

## 2015-07-16 ENCOUNTER — Ambulatory Visit: Payer: Medicare PPO

## 2015-07-16 ENCOUNTER — Ambulatory Visit (INDEPENDENT_AMBULATORY_CARE_PROVIDER_SITE_OTHER): Payer: Medicare PPO | Admitting: Internal Medicine

## 2015-07-16 VITALS — BP 112/62 | HR 132 | Ht 66.5 in | Wt 170.2 lb

## 2015-07-16 DIAGNOSIS — J45901 Unspecified asthma with (acute) exacerbation: Secondary | ICD-10-CM

## 2015-07-16 DIAGNOSIS — J441 Chronic obstructive pulmonary disease with (acute) exacerbation: Secondary | ICD-10-CM

## 2015-07-16 DIAGNOSIS — I482 Chronic atrial fibrillation, unspecified: Secondary | ICD-10-CM

## 2015-07-16 DIAGNOSIS — J45909 Unspecified asthma, uncomplicated: Secondary | ICD-10-CM

## 2015-07-16 DIAGNOSIS — J4540 Moderate persistent asthma, uncomplicated: Secondary | ICD-10-CM | POA: Diagnosis not present

## 2015-07-16 DIAGNOSIS — I429 Cardiomyopathy, unspecified: Secondary | ICD-10-CM

## 2015-07-16 DIAGNOSIS — R05 Cough: Secondary | ICD-10-CM | POA: Diagnosis not present

## 2015-07-16 DIAGNOSIS — J040 Acute laryngitis: Secondary | ICD-10-CM

## 2015-07-16 DIAGNOSIS — J449 Chronic obstructive pulmonary disease, unspecified: Secondary | ICD-10-CM

## 2015-07-16 MED ORDER — METHYLPREDNISOLONE ACETATE 80 MG/ML IJ SUSP
80.0000 mg | Freq: Once | INTRAMUSCULAR | Status: AC
  Administered 2015-07-16: 80 mg via INTRAMUSCULAR

## 2015-07-16 MED ORDER — EPINEPHRINE 0.3 MG/0.3ML IJ SOAJ: 0.3000 mg | Freq: Once | INTRAMUSCULAR | Status: DC

## 2015-07-16 MED ORDER — PHENYLEPHRINE HCL 1 % NA SOLN
3.0000 [drp] | Freq: Once | NASAL | Status: AC
  Administered 2015-07-16: 3 [drp] via NASAL

## 2015-07-16 MED ORDER — OMALIZUMAB 150 MG ~~LOC~~ SOLR
300.0000 mg | Freq: Once | SUBCUTANEOUS | Status: AC
  Administered 2015-07-16: 300 mg via SUBCUTANEOUS

## 2015-07-16 MED ORDER — CEFDINIR 300 MG PO CAPS: ORAL_CAPSULE | ORAL | Status: DC

## 2015-07-16 NOTE — Progress Notes (Signed)
Patient ID: [name], female    DOB: Mar 11, 1934, 81 y.o.   MRN: [id number]  HPI 7/113/12-  76 yoF former smoker, followed for Chronic asthma/ COPD, chronic rhinosinusitis, complicated by hx AFib/ anticoagulation, CHF, cardiomyopathy, mitral insufficiency, GERD Last here December 11/19/10- note reviewed She has felt well controlled with no acute breathing issues. She saw Dr Domenic Polite for cardiac f/u 2 weeks ago and Echo was good with no med changes. Uses rescue inhaler only occasionally, usually on first rising. Little wheeze at times, much better. Little DOE with ADLs PFT 12/06/10= chronic obstructive asthma   7/113/12-  76 yoF former smoker, followed for Chronic asthma/ COPD, chronic rhinosinusitis, complicated by hx AFib/ anticoagulation, CHF, cardiomyopathy, mitral insufficiency, GERD Has had flu vaccine. Persistent head and chest congestion for the past 2 weeks. On November 7 we sent  Avelox for 5 days. This helped but didn't last long enough. She took a leftover prednisone taper but wants to avoid more prednisone now if possible. She has noted mild tremor in her hands for the past month no lateralizing finding. She continues Xolair injections. Using nebulizer or rescue inhaler only every 1 or 2 days. Has lost some weight and admits she has been dieting some but otherwise she's not sure that explains it. Thyroid function test normal in the past.  11/27/11- 76 yoF former smoker, followed for Chronic asthma/ COPD, chronic rhinosinusitis, complicated by hx AFib/ anticoagulation, CHF, cardiomyopathy, mitral insufficiency, GERD, Lung nodules Continues to do well. Continues Xolair. She thyroid nodule biopsy by interventional radiology. Waiting for results. Eyes have been watering some increased nasal drainage. We discussed possibility of seasonal allergic symptoms when she is getting Xolair. CT chest 10/20/2011-bilateral nodules less than 10 mm. Needs followup CT scan in May.  02/05/12- 76 yoF  former smoker, followed for Chronic asthma/ COPD, chronic rhinosinusitis, complicated by hx AFib/ anticoagulation, CHF, cardiomyopathy, mitral insufficiency, GERD, Lung nodules Denies any cough, congestion, SOB, or wheezing, Slight flare up with allergies at this time and still on Xolair. CAT score 8 She is pleased to be holding her current weight. Sometimes has a little wheeze but not much rarely needs a rescue inhaler and uses her nebulizer 2 or 3 times per week. Continues Xolair and believes it helps. Denies reflux or heartburn. CT chest  01/29/12- images reviewed with her. IMPRESSION:  1. Scattered bilateral pulmonary nodules are unchanged in size  from 10/22/2011. Additional follow-up CT chest without contrast  could be performed in 6 months to ensure continued stability, as  clinically indicated. This recommendation follows the consensus  statement: Guidelines for Management of Small Pulmonary Nodules  Detected on CT Scans: A Statement from the [facility] as  published in Radiology 2005; 237:395-400. If multidisciplinary  follow up management is desired, this is available in the [facility] through the [facility] [id number]-  [id number].  2. Dominant right thyroid nodule. Consider further evaluation with  thyroid ultrasound. If patient is clinically hyperthyroid,  consider nuclear medicine thyroid uptake and scan.  Original Report Authenticated By: Luretha Rued, M.D.    08/09/12- 43 yoF former smoker, followed for Chronic asthma/ COPD, chronic rhinosinusitis, complicated by hx AFib/ anticoagulation, CHF, cardiomyopathy, mitral insufficiency, GERD, Lung nodules FOLLOWS FOR: denies any SOB, wheezing or cough. States she is having chest/throat congestion. For 2 weeks has had "congestion" in throat and chest. Dr. Hilma Favors treated Levaquin and it one week ago. She denies purulent discharge, fever or wheeze. Echocardiogram 05/03/2012-further improved. EF  60-65% with mitral regurgitation and mitral valve prolapse. Chest refill Valium use occasionally. CT 08/04/12-reviewed with her IMPRESSION:  1. Stable bilateral pulmonary nodules. No new pulmonary nodules  are noted. A follow-up examination in 1 year is recommended to  assure stability.  2. Stable 2 cm low density nodule in the right lobe of the thyroid  gland.  3. No acute infiltrate or pulmonary edema. Small hiatal hernia  again noted.  Original Report Authenticated By: Lahoma Crocker, M.D.  02/07/13- 36 yoF former smoker, followed for Chronic asthma/ COPD, chronic rhinosinusitis, complicated by hx AFib/ anticoagulation, CHF, cardiomyopathy, mitral insufficiency, GERD, Lung nodules FOLLOWS FOR: still on Xolair; drainage in throat. Denies any flare ups with asthma or allergies at this time. Taking Zyrtec. Some postnasal drip. Had thyroid nodule biopsied.  08/11/13- 79 yoF former smoker, followed for Chronic asthma/ COPD, chronic rhinosinusitis, complicated by hx AFib/ anticoagulation, CHF, cardiomyopathy, mitral insufficiency, GERD, Lung nodules FOLLOWS FOR: has had shingles for aobut 4 weeks; still on Xolair and plans to get injection for December as she did not take November's injection. Shingles right lateral rib cage- valacyclovir Rx'd. Still hurts. It is not needing oxygen. CT 07/15/13  IMPRESSION:  1. No acute cardiopulmonary abnormalities.  2. Stable nodules from 10/22/2011. These are unchanged for almost 19  months and are likely benign.  3. Atherosclerotic disease.  4. Hiatal hernia status post fundoplication.  Electronically Signed  By: Kerby Moors M.D.  On: 07/14/2013 13:04  12/01/13- 79 yoF former smoker, followed for Chronic asthma/ COPD, chronic rhinosinusitis, complicated by hx AFib/ anticoagulation, CHF, cardiomyopathy, mitral insufficiency, GERD, Lung nodules ACUTE VISIT: stuffy head, coughing so hard (mainly at night) that she isnt sleeping. Productive at times-white in  color. Due for Xolair today however working with Insurance to approve. Ended prednisone 10 mg daily maintenance 3 weeks ago. Since then has felt more washed out and tired. Had lab work indicating no heart failure, but kidney function poor> Nephrology. Increased nasal congestion. Cough keeps her awake. Some wheeze. Using nebulizer 3 or 4 times daily, Xolair on schedule.  01/31/14- 79 yoF former smoker, followed for Chronic asthma/ COPD, chronic rhinosinusitis, Lung nodules complicated by hx AFib/ anticoagulation, CHF, cardiomyopathy, mitral insufficiency, GERD, CKD FOLLOWS FOR: Still on Xolair and doing well; no flare ups and no cough Says she is doing "great" now with no acute symptoms.  03/30/14-  79 yoF former smoker, followed for Chronic asthma/ COPD, chronic rhinosinusitis, Lung nodules complicated by hx AFib/ anticoagulation, CHF, cardiomyopathy, mitral insufficiency, GERD, CKD ACUTE VISIT: increased cough; SOB and wheezing. Had her Xolair injection on Friday 03-24-14 Recently on Avelox for 7 days for sinus infection from  PCP office as well as short dose of Pred . Does not feel sick. Sinusitis resolved. No headache. When she coughs her nose runs copious white clear mucus.  06/05/14-  80 yoF former smoker, followed for Chronic asthma/ COPD, chronic rhinosinusitis, Lung nodules complicated by hx AFib/ anticoagulation, CHF, cardiomyopathy, mitral insufficiency, GERD, CKD     Xolair FOLLOWS FOR: still on Xolair and doing well on it. Flu shot today as well. Albuterol neb needs ot go through [employer] from now on. Minor allergy symptoms this summer with watery eyes, but nothing bad. Feels very well. Admits on questioning that she does notice a little wheeze. Uses nebulizer machine occasionally.  11/13/14-80 yoF former smoker, followed for Chronic asthma/ COPD, chronic rhinosinusitis, Lung nodules complicated by hx AFib/ anticoagulation, CHF, cardiomyopathy, mitral insufficiency, GERD,  CKD FOLLOW FOR:  Asthma- Xolair working well for her.  breathing not too bad today, little congestion.  no concerns today. Breathing feels good. Blames allergy for irritated eyes x 3 weeks- on Zyrtec now. Stopped prednisone 2 weeks ago  03/15/15- 80 yoF former smoker, followed for Chronic asthma/ COPD, chronic rhinosinusitis, Lung nodules complicated by hx AFib/ anticoagulation, CHF, cardiomyopathy, mitral insufficiency, GERD, CKD Reports: Continues xolair inj; asthma; SOB doing better since BREO;  Prod cough w/white mucus; yellow nasal drip started this am; drainage Denies headache, fever, sore throat, wheeze or chest tightness We reviewed x-ray images CXR 01/16/15- IMPRESSION: COPD without acute superimposed disease. Electronically Signed  By: Monte Fantasia M.D.  On: 01/16/2015 12:40  07/16/15- 80 yoF former smoker, followed for Chronic asthma/ COPD, chronic rhinosinusitis, Lung nodules complicated by hx AFib/ anticoagulation, CHF, cardiomyopathy, mitral insufficiency, GERD, CKD Xolair FOLLOWS FOR: Pt continues to stay hoarse(for about 3 months now); continues Xolair and no reactions; pt states Nasal Spray and Breo have worked well for her.  Had a cold 3 months ago with lingering laryngitis/hoarseness, persistent postnasal drainage, frontal headache, white/clear mucus. Azelastine nasal spray helps. Chest feels controlled without significant wheeze. Likes Breo. Cardiology and nephrology have been adjusting medication for heart rate. Echocardiogram-atrial fibrillation, LVEF 30-35%, diffuse hypokinesis  Review of Systems-see HPI Constitutional:    weight loss, night sweats, fevers, chills, +fatigue, lassitude. HEENT:   No-  headaches, difficulty swallowing, tooth/dental problems, sore throat,       No-  sneezing, itching, ear ache, no- nasal congestion, +post nasal drip,  CV:  No- chest pain, no-orthopnea, PND, swelling in lower extremities, anasarca, dizziness, palpitations Resp: +    shortness of breath with exertion or at rest.              No-   productive cough,  +non-productive cough,  No- coughing up of blood.              No-   change in color of mucus. + wheezing.   Skin: No-   rash or lesions. GI:  No-   heartburn, indigestion, abdominal pain, nausea, vomiting,  GU: . MS:  No-   joint pain or swelling.  Neuro-     nothing unusual Psych:  No- change in mood or affect. No depression or anxiety.  No memory loss.  Objective:   Physical Exam General- Alert, Oriented, Affect-appropriate/ cheerful, Distress- none acute, thin Skin- no rash visible now on right lateral ribs Lymphadenopathy- none Head- atraumatic            Eyes- Gross vision intact, PERRLA, +conjunctivae injected            Ears- Hearing, canals-normal            Nose- clear, turbinate edema, no-Septal dev, mucus, polyps, erosion, perforation             Throat- Mallampati III , mucosa clear/ not red , drainage- none, tonsils- atrophic,  dentures.+ Mild hoarseness .                     + Large torus hard palate Neck- flexible , trachea midline, no stridor , thyroid nl, carotid no bruit Chest - symmetrical excursion , unlabored           Heart/CV- +IRR/ AFib  + faint ? Mitral regurg murmur , no gallop  , + HR 132  no rub, nl s1 s2                           - JVD- none , edema- none, stasis changes- none, varices- none           Lung-  + mild bilateral rhonchi, unlabored, wheeze + expiratory, Cough+light , dullness-none, rub- none           Chest wall-  Abd-  Br/ Gen/ Rectal- Not done, not indicated Extrem- cyanosis- none, clubbing, none, atrophy- none, strength- nl Neuro- slight resting tremor

## 2015-07-16 NOTE — Progress Notes (Signed)
Quick Note:  ATC pt she was not available, left message for pt to call back ______

## 2015-07-16 NOTE — Patient Instructions (Addendum)
Order-chest x-ray  DX asthma with COPD exacerbation  Order- neb neo nasal              Depo 80   If the hoarseness doesn't get better i suggest you see your ENT Dr Erik Obey

## 2015-07-17 ENCOUNTER — Telehealth: Payer: Self-pay | Admitting: Internal Medicine

## 2015-07-17 MED ORDER — DIAZEPAM 5 MG PO TABS
5.0000 mg | ORAL_TABLET | Freq: Two times a day (BID) | ORAL | Status: DC | PRN
Start: 2015-07-17 — End: 2016-02-09

## 2015-07-17 NOTE — Telephone Encounter (Signed)
Ok Rx diazepam 5 mg, # 30, 1 every 12 hours if needed for anxiety or sleep  Ref x 3

## 2015-07-17 NOTE — Telephone Encounter (Signed)
Patient notified of Dr. Janee Morn recommendations Rx called into [facility] pharmacy Patient notified that RX has been called in. Nothing further needed. Closing encounter

## 2015-07-17 NOTE — Telephone Encounter (Signed)
Spoke with pt's niece Angie, relayed more detailed cxr results as explained on cxr results note.  Angie expressed understanding. Angie also states that CY was [facility] pt[facility] [place]   I do not see where this has been prescribed by our office or ok'ed by CY in yesterday's ov note.  CY please advise on diazepam refill.  Thanks!

## 2015-07-22 DIAGNOSIS — R490 Dysphonia: Secondary | ICD-10-CM | POA: Insufficient documentation

## 2015-07-22 NOTE — Assessment & Plan Note (Signed)
Persistent hoarseness may have begun with a sinus infection. We discussed possible connection to postnasal drainage and to reflux. If it persists she will see her ENT

## 2015-07-22 NOTE — Assessment & Plan Note (Signed)
Rapid ventricular response rate, HR 132/m Plan-cardiology follow-up

## 2015-07-22 NOTE — Assessment & Plan Note (Addendum)
Good control now with Breo after exacerbation earlier this fall Plan-chest x-ray

## 2015-07-22 NOTE — Assessment & Plan Note (Signed)
Echocardiogram-atrial fibrillation, LVEF 30-35%, diffuse hypokinesis Actively followed by cardiology

## 2015-07-26 ENCOUNTER — Ambulatory Visit (INDEPENDENT_AMBULATORY_CARE_PROVIDER_SITE_OTHER): Payer: Medicare PPO | Admitting: Cardiology

## 2015-07-26 ENCOUNTER — Encounter: Payer: Self-pay | Admitting: Cardiology

## 2015-07-26 VITALS — BP 102/64 | HR 100 | Ht 66.0 in | Wt 166.2 lb

## 2015-07-26 DIAGNOSIS — I482 Chronic atrial fibrillation, unspecified: Secondary | ICD-10-CM

## 2015-07-26 DIAGNOSIS — I340 Nonrheumatic mitral (valve) insufficiency: Secondary | ICD-10-CM

## 2015-07-26 DIAGNOSIS — I429 Cardiomyopathy, unspecified: Secondary | ICD-10-CM | POA: Diagnosis not present

## 2015-07-26 DIAGNOSIS — D631 Anemia in chronic kidney disease: Secondary | ICD-10-CM | POA: Diagnosis not present

## 2015-07-26 DIAGNOSIS — I10 Essential (primary) hypertension: Secondary | ICD-10-CM | POA: Diagnosis not present

## 2015-07-26 DIAGNOSIS — N183 Chronic kidney disease, stage 3 (moderate): Secondary | ICD-10-CM | POA: Diagnosis not present

## 2015-07-26 DIAGNOSIS — N2581 Secondary hyperparathyroidism of renal origin: Secondary | ICD-10-CM | POA: Diagnosis not present

## 2015-07-26 DIAGNOSIS — E876 Hypokalemia: Secondary | ICD-10-CM | POA: Diagnosis not present

## 2015-07-26 MED ORDER — DILTIAZEM HCL ER COATED BEADS 240 MG PO CP24: 240.0000 mg | ORAL_CAPSULE | Freq: Every day | ORAL | Status: DC

## 2015-07-26 NOTE — Patient Instructions (Signed)
Medication Instructions:  INCREASE CARDIZEM to 240 MG DAILY  Labwork: NONE  Testing/Procedures: NONE  Follow-Up: Your physician recommends that you schedule a follow-up appointment in: 3 MONTHS WITH DR. MCDOWELL   Any Other Special Instructions Will Be Listed Below (If Applicable).     If you need a refill on your cardiac medications before your next appointment, please call your pharmacy. Thanks for choosing [facility]!!!

## 2015-07-26 NOTE — Progress Notes (Signed)
Cardiology Office Note  Date: 07/26/2015   ID: [name], DOB December 29, 1933, MRN [id number]  PCP: Purvis Kilts, MD  Primary Cardiologist: [name], MD   Chief Complaint  Patient presents with  . Atrial Fibrillation  . Cardiomyopathy    History of Present Illness: [name] is a 81 y.o. female seen recently in October. At that time it was noted that her heart rate in atrial fibrillation had increased over prior baseline in the setting of lower dose Cardizem CD. She also had evidence of worsening cardiomyopathy, with previous history of tachycardia-mediated cardiomyopathy and mitral valve regurgitation. We increased her Cardizem CD dose, and she presents for follow-up.  Heart rate has come down, but is still not optimally controlled. He has tolerated the increase in Cardizem CD made at the last visit. Today we talked about going back to her prior Cardizem CD dose of 240 mg daily, keeping a close eye on blood pressure and heart rate. She is also on Lanoxin.  She does not report any worsening in breathing status over baseline, and does not have any sense of palpitations.   Past Medical History  Diagnosis Date  . Asthma   . Atrial fibrillation ([place])   . COPD (chronic obstructive pulmonary disease) ([place])   . GERD (gastroesophageal reflux disease)   . Mitral valve prolapse   . Nonischemic cardiomyopathy ([facility])     LVEF 25-30% improved to 45-50%  . Chronic rhinitis   . Obstructive sleep apnea   . Transudative pleural effusion   . Nasal polyps   . Mitral regurgitation     Current Outpatient Prescriptions  Medication Sig Dispense Refill  . albuterol (PROVENTIL HFA;VENTOLIN HFA) 108 (90 BASE) MCG/ACT inhaler Inhale 2 puffs into the lungs every 6 (six) hours as needed for wheezing or shortness of breath. 1 Inhaler 11  . albuterol (PROVENTIL) (2.5 MG/3ML) 0.083% nebulizer solution Take 3 mLs (2.5 mg total) by nebulization every 4 (four) hours as needed for  wheezing or shortness of breath. 360 mL 3  . atorvastatin (LIPITOR) 10 MG tablet Take 10 mg by mouth daily.      Marland Kitchen azelastine (ASTELIN) 0.1 % nasal spray Place 1-2 sprays into both nostrils 2 (two) times daily. Use in each nostril as directed 30 mL prn  . Calcium Carbonate-Vitamin D (CALTRATE 600+D) 600-400 MG-UNIT per tablet Take 1 tablet by mouth daily.      . cefdinir (OMNICEF) 300 MG capsule 1 twice daily 20 capsule 0  . cetirizine (ZYRTEC) 10 MG tablet Take 10 mg by mouth daily.    . citalopram (CELEXA) 40 MG tablet Take 1 tablet by mouth daily.    . diazepam (VALIUM) 2 MG tablet Take 2 mg by mouth 3 (three) times daily as needed for anxiety.    . diazepam (VALIUM) 5 MG tablet Take 1 tablet (5 mg total) by mouth every 12 (twelve) hours as needed for anxiety. 30 tablet 3  . digoxin (LANOXIN) 0.125 MG tablet TAKE 1 TABLET EVERY DAY 90 tablet 3  . EPINEPHrine 0.3 mg/0.3 mL IJ SOAJ injection Inject 0.3 mLs (0.3 mg total) into the muscle once. 1 Device 11  . esomeprazole (NEXIUM) 40 MG capsule Take 40 mg by mouth 2 (two) times daily.      . Fluticasone Furoate-Vilanterol (BREO ELLIPTA) 200-25 MCG/INH AEPB Inhale 1 puff into the lungs daily. Rinse mouth 60 each 11  . furosemide (LASIX) 40 MG tablet TAKE 1 TABLET EVERY DAY 90 tablet 3  .  mometasone (NASONEX) 50 MCG/ACT nasal spray Place 2 sprays into the nose as needed (congestion/allergies).     Marland Kitchen omalizumab (XOLAIR) 150 MG injection Inject 300 mg into the skin every 28 (twenty-eight) days. 2 vial 2  . potassium chloride SA (KLOR-CON M20) 20 MEQ tablet Take 1 tablet (20 mEq total) by mouth daily. 30 tablet 6  . traMADol (ULTRAM) 50 MG tablet Take 1 tablet (50 mg total) by mouth every 6 (six) hours as needed. 90 tablet 2  . warfarin (COUMADIN) 5 MG tablet TAKE 1 TABLET (5MG ) EVERY DAY EXCEPT ON WEDNESDAYS TAKE 1 AND 1/2 TABLETS (7.5MG ) 98 tablet 3  . diltiazem (CARDIZEM CD) 240 MG 24 hr capsule Take 1 capsule (240 mg total) by mouth daily. 90  capsule 3   No current facility-administered medications for this visit.    Allergies:  Amoxicillin-pot clavulanate; Clarithromycin; Clindamycin; Doxycycline; and Penicillins   Social History: The patient  reports that she quit smoking about 34 years ago. Her smoking use included Cigarettes. She has a 10 pack-year smoking history. She has never used smokeless tobacco. She reports that she does not drink alcohol or use illicit drugs.   ROS:  Please see the history of present illness. Otherwise, complete review of systems is positive for hoarseness.  All other systems are reviewed and negative.   Physical Exam: VS:  BP 102/64 mmHg  Pulse 100  Ht 5\' 6"  (1.676 m)  Wt 166 lb 3.2 oz (75.388 kg)  BMI 26.84 kg/m2  SpO2 99%, BMI Body mass index is 26.84 kg/(m^2).  Wt Readings from Last 3 Encounters:  07/26/15 166 lb 3.2 oz (75.388 kg)  07/16/15 170 lb 3.2 oz (77.202 kg)  07/04/15 168 lb 9.6 oz (76.476 kg)     Elderly woman, appears comfortable at rest.  HEENT: Lids normal, conjunctiva normal, oropharynx clear.  Neck: Supple, no elevated JVP or carotid bruits, no thyromegaly.  Lungs: Scattered rhonchi with decreased breath sounds, no labored breathing.  Cardiac: Irregularly irregular, rapid, soft apical systolic murmur, no pericardial rub.  Abdomen: Soft, nontender, bowel sound present.  Skin: Warm and dry.  Extremities: No pitting edema below the knees.    ECG: ECG is not ordered today.  Other studies reviewed today:  Echocardiogram 07/04/2015: Study Conclusions  - Left ventricle: The cavity size was normal. Wall thickness was normal. Systolic function was moderately to severely reduced. The estimated ejection fraction was in the range of 30% to 35%. Diffuse hypokinesis. There is severe hypokinesis of the mid-apicalanteroseptal myocardium. The study is not technically sufficient to allow evaluation of LV diastolic function. - Aortic valve: Mildly calcified  annulus. Trileaflet. - Mitral valve: Calcified annulus. Moderately thickened leaflets . Moderate prolapse, involving the anterior leaflet and the posterior leaflet. There was moderate regurgitation directed posteriorly. - Left atrium: The atrium was severely dilated. - Right atrium: The atrium was moderately dilated. Central venous pressure (est): 3 mm Hg. - Tricuspid valve: There was mild regurgitation. - Pulmonary arteries: Systolic pressure could not be accurately estimated. - Pericardium, extracardiac: There was no pericardial effusion.  Impressions:  - Rhythm appears to be atrial fibrillation with rapid ventricular response. Normal LV wall thickness with LVEF approximately 30-35%, diffuse hypokinesis, most prominent in the distal anteroseptal wall. Indeterminate diastolic function. Severe left atrial enlargement. Moderate to severe MAC with moderately thickened mitral leaflets, moderate bileaflet prolapse, and moderate posteriorly directed mitral regurgitation. Mildly sclerotic aortic valve. Normal RV contraction. Mild tricuspid regurgitation, unable to assess PASP. Since the prior study in  November 2015, there has been reduction in LVEF.   ASSESSMENT AND PLAN:  1. Chronic atrial fibrillation, will increase Cardizem CD back to 240 mg daily, otherwise continue Lanoxin and Coumadin. She will keep a close eye on blood pressure following this change. She tolerated this dose previously, and it should provide better heart rate control, hopefully resulting in improved LVEF which we had seen in the past.  2. Mitral valve prolapse with regurgitation, moderate by recent follow-up assessment.  3. Nonischemic cardiomyopathy, tachycardia-mediated based on prior evaluation. LVEF is reduced to the range of 30-35% in the setting of RVR. I am hopeful that this will improve as it had previously.  Current medicines were reviewed at length with the patient  today.  Disposition: FU with me in 3 months.   Signed, Satira Sark, MD, [facility] 07/26/2015 12:16 PM    Warden at [facility] [address], [place], [place] [address] Phone: [phone]; Fax: [phone]

## 2015-07-28 ENCOUNTER — Other Ambulatory Visit: Payer: Self-pay | Admitting: Internal Medicine

## 2015-07-30 ENCOUNTER — Ambulatory Visit (INDEPENDENT_AMBULATORY_CARE_PROVIDER_SITE_OTHER): Payer: Medicare PPO | Admitting: *Deleted

## 2015-07-30 ENCOUNTER — Telehealth: Payer: Self-pay | Admitting: Internal Medicine

## 2015-07-30 DIAGNOSIS — I4891 Unspecified atrial fibrillation: Secondary | ICD-10-CM

## 2015-07-30 DIAGNOSIS — Z5181 Encounter for therapeutic drug level monitoring: Secondary | ICD-10-CM

## 2015-07-30 LAB — POCT INR: INR: 3.1

## 2015-07-30 NOTE — Telephone Encounter (Signed)
#   vials:2 Ordered date:07/30/15 Shipping Date:07/31/15

## 2015-08-01 NOTE — Telephone Encounter (Signed)
#   Vials:2 Arrival Date:08/01/15 Lot [id number] Exp Date:2/20

## 2015-08-08 DIAGNOSIS — I129 Hypertensive chronic kidney disease with stage 1 through stage 4 chronic kidney disease, or unspecified chronic kidney disease: Secondary | ICD-10-CM | POA: Diagnosis not present

## 2015-08-08 DIAGNOSIS — E876 Hypokalemia: Secondary | ICD-10-CM | POA: Diagnosis not present

## 2015-08-08 DIAGNOSIS — N2581 Secondary hyperparathyroidism of renal origin: Secondary | ICD-10-CM | POA: Diagnosis not present

## 2015-08-08 DIAGNOSIS — N183 Chronic kidney disease, stage 3 (moderate): Secondary | ICD-10-CM | POA: Diagnosis not present

## 2015-08-15 ENCOUNTER — Other Ambulatory Visit: Payer: Self-pay | Admitting: Internal Medicine

## 2015-08-15 ENCOUNTER — Ambulatory Visit: Payer: Medicare PPO

## 2015-08-15 NOTE — Telephone Encounter (Signed)
Ok to refill 

## 2015-08-15 NOTE — Telephone Encounter (Signed)
Electronic refill request pt. Prescribed on 04-04-15 with 2 refills 90 tabs q6h PRN last ov 07-16-2015 to return in 3 mo. Next ov 01/14/2016 Dr. Annamaria Boots please advise if okay to refill.

## 2015-08-16 ENCOUNTER — Telehealth: Payer: Self-pay | Admitting: Internal Medicine

## 2015-08-16 NOTE — Telephone Encounter (Signed)
Called number provided and NA, unable to leave msg since MB not set up  [facility] on home number listed

## 2015-08-17 NOTE — Telephone Encounter (Signed)
Attempted to call EC back about pt No answer and could not leave voicemail due to VM not being set up  Will try to call back at a later time.

## 2015-08-20 ENCOUNTER — Ambulatory Visit (INDEPENDENT_AMBULATORY_CARE_PROVIDER_SITE_OTHER): Payer: Medicare PPO | Admitting: *Deleted

## 2015-08-20 DIAGNOSIS — I4891 Unspecified atrial fibrillation: Secondary | ICD-10-CM | POA: Diagnosis not present

## 2015-08-20 DIAGNOSIS — Z5181 Encounter for therapeutic drug level monitoring: Secondary | ICD-10-CM | POA: Diagnosis not present

## 2015-08-20 LAB — POCT INR: INR: 3.1

## 2015-08-20 MED ORDER — CEFDINIR 300 MG PO CAPS: ORAL_CAPSULE | ORAL | Status: DC

## 2015-08-20 NOTE — Telephone Encounter (Signed)
ATC pt niece Angie- Hotel manager message - "call cannot be completed at this time, there are restrictions on this line, announcement 24"  Called home number of patient [phone] (H) - Spoke with Angie, states that the patient was coughing up yellow mucus- started clear x 1 week ago and switched over to bright yellow on 08/16/15.  Pt was having coughing, wheezing and SOB - no worse than normal.  Niece states that over this past weekend her mucus turned back clear in color - pt reports feling feverish over the weekend but never ran a temp.  Pt completed Omnicef 300mg  (BID) given at 07/16/15 acute visit - seemed to help alleviate symptoms about 90% Wanting to know if appt is needed. Please advise Dr Annamaria Boots.   Allergies  Allergen Reactions  . Amoxicillin-Pot Clavulanate   . Clarithromycin   . Clindamycin   . Doxycycline   . Penicillins      Medication List       This list is accurate as of: 08/16/15 11:59 PM.  Always use your most recent med list.               albuterol 108 (90 BASE) MCG/ACT inhaler  Commonly known as:  PROVENTIL HFA;VENTOLIN HFA  Inhale 2 puffs into the lungs every 6 (six) hours as needed for wheezing or shortness of breath.     albuterol (2.5 MG/3ML) 0.083% nebulizer solution  Commonly known as:  PROVENTIL  INHALE THE CONTENTS OF 1 VIAL (2.5MG /3ML) USING NEBULIZER EVERY 4 HOURS AS NEEDED FOR WHEEZING OR SHORTNESS OF BREATH.     atorvastatin 10 MG tablet  Commonly known as:  LIPITOR  Take 10 mg by mouth daily.     azelastine 0.1 % nasal spray  Commonly known as:  ASTELIN  Place 1-2 sprays into both nostrils 2 (two) times daily. Use in each nostril as directed     CALTRATE 600+D 600-400 MG-UNIT tablet  Generic drug:  Calcium Carbonate-Vitamin D  Take 1 tablet by mouth daily.     cefdinir 300 MG capsule  Commonly known as:  OMNICEF  1 twice daily     cetirizine 10 MG tablet  Commonly known as:  ZYRTEC  Take 10 mg by mouth daily.     citalopram 40 MG tablet  Commonly known as:  CELEXA  Take 1 tablet by mouth daily.     diazepam 2 MG tablet  Commonly known as:  VALIUM  Take 2 mg by mouth 3 (three) times daily as needed for anxiety.     diazepam 5 MG tablet  Commonly known as:  VALIUM  Take 1 tablet (5 mg total) by mouth every 12 (twelve) hours as needed for anxiety.     digoxin 0.125 MG tablet  Commonly known as:  LANOXIN  TAKE 1 TABLET EVERY DAY     diltiazem 240 MG 24 hr capsule  Commonly known as:  CARDIZEM CD  Take 1 capsule (240 mg total) by mouth daily.     EPINEPHrine 0.3 mg/0.3 mL Soaj injection  Commonly known as:  EPI-PEN  Inject 0.3 mLs (0.3 mg total) into the muscle once.     esomeprazole 40 MG capsule  Commonly known as:  NEXIUM  Take 40 mg by mouth 2 (two) times daily.     Fluticasone Furoate-Vilanterol 200-25 MCG/INH Aepb  Commonly known as:  BREO ELLIPTA  Inhale 1 puff into the lungs daily. Rinse mouth     furosemide 40 MG tablet  Commonly known  as:  LASIX  TAKE 1 TABLET EVERY DAY     mometasone 50 MCG/ACT nasal spray  Commonly known as:  NASONEX  Place 2 sprays into the nose as needed (congestion/allergies).     omalizumab 150 MG injection  Commonly known as:  XOLAIR  Inject 300 mg into the skin every 28 (twenty-eight) days.     potassium chloride SA 20 MEQ tablet  Commonly known as:  KLOR-CON M20  Take 1 tablet (20 mEq total) by mouth daily.     traMADol 50 MG tablet  Commonly known as:  ULTRAM  TAKE 1 TABLET BY MOUTH EVERY 4 HOURS AS NEEDED     warfarin 5 MG tablet  Commonly known as:  COUMADIN  TAKE 1 TABLET (5MG ) EVERY DAY EXCEPT ON WEDNESDAYS TAKE 1 AND 1/2 TABLETS (7.5MG )

## 2015-08-20 NOTE — Telephone Encounter (Signed)
Spoke with Angie. She is aware of CY's recommendations. They would like to have Cefdinir refilled. This has been done. Nothing further was needed.

## 2015-08-20 NOTE — Telephone Encounter (Signed)
If sputum color is clearing then don't think she needs appointment now. This will probably go on and run its course.  If she feels she needs more antibiotic, then ok to refill the cefdinir we gave recently

## 2015-08-28 ENCOUNTER — Ambulatory Visit (INDEPENDENT_AMBULATORY_CARE_PROVIDER_SITE_OTHER): Payer: Medicare PPO

## 2015-08-28 ENCOUNTER — Telehealth: Payer: Self-pay | Admitting: Internal Medicine

## 2015-08-28 DIAGNOSIS — J4540 Moderate persistent asthma, uncomplicated: Secondary | ICD-10-CM | POA: Diagnosis not present

## 2015-08-28 NOTE — Telephone Encounter (Signed)
#   Vials:2 Arrival Date:08/28/15 Lot [id number] Exp Date:2/20

## 2015-08-28 NOTE — Telephone Encounter (Signed)
#   vials: Ordered date:08/20/15 Shipping Date:12/19/1

## 2015-08-29 MED ORDER — OMALIZUMAB 150 MG ~~LOC~~ SOLR
300.0000 mg | Freq: Once | SUBCUTANEOUS | Status: AC
  Administered 2015-08-29: 300 mg via SUBCUTANEOUS

## 2015-09-04 ENCOUNTER — Other Ambulatory Visit (HOSPITAL_COMMUNITY): Payer: Self-pay | Admitting: Family Medicine

## 2015-09-04 DIAGNOSIS — Z1231 Encounter for screening mammogram for malignant neoplasm of breast: Secondary | ICD-10-CM

## 2015-09-05 ENCOUNTER — Ambulatory Visit (INDEPENDENT_AMBULATORY_CARE_PROVIDER_SITE_OTHER): Payer: Medicare PPO | Admitting: *Deleted

## 2015-09-05 DIAGNOSIS — I4891 Unspecified atrial fibrillation: Secondary | ICD-10-CM

## 2015-09-05 DIAGNOSIS — Z5181 Encounter for therapeutic drug level monitoring: Secondary | ICD-10-CM | POA: Diagnosis not present

## 2015-09-05 LAB — POCT INR: INR: 2.5

## 2015-09-06 ENCOUNTER — Ambulatory Visit (HOSPITAL_COMMUNITY): Payer: Medicare PPO

## 2015-09-12 ENCOUNTER — Ambulatory Visit (HOSPITAL_COMMUNITY)
Admission: RE | Admit: 2015-09-12 | Discharge: 2015-09-12 | Disposition: A | Discharge status: Home / Self Care | Payer: Medicare PPO | Source: Ambulatory Visit | Attending: Family Medicine | Admitting: Family Medicine

## 2015-09-12 DIAGNOSIS — Z1231 Encounter for screening mammogram for malignant neoplasm of breast: Secondary | ICD-10-CM | POA: Diagnosis not present

## 2015-09-17 ENCOUNTER — Telehealth: Payer: Self-pay | Admitting: Internal Medicine

## 2015-09-17 NOTE — Telephone Encounter (Signed)
Spoke with pt, states she received a letter from [employer] saying that they will not be covering her Xolair injections this year.   Katie, please advise. Thanks!

## 2015-09-18 ENCOUNTER — Telehealth: Payer: Self-pay | Admitting: Internal Medicine

## 2015-09-18 NOTE — Telephone Encounter (Signed)
#   vials:2 Ordered date:09/18/15 [employer] called [place] to set up delivery, we have a dose on hand. The rep. Went ahead and set up shipment for pt.'s next month  appt. Del.10/18/15. Shipping Date:10/17/15

## 2015-09-19 ENCOUNTER — Ambulatory Visit (INDEPENDENT_AMBULATORY_CARE_PROVIDER_SITE_OTHER): Payer: Medicare PPO | Admitting: *Deleted

## 2015-09-19 DIAGNOSIS — Z5181 Encounter for therapeutic drug level monitoring: Secondary | ICD-10-CM

## 2015-09-19 DIAGNOSIS — I4891 Unspecified atrial fibrillation: Secondary | ICD-10-CM

## 2015-09-19 LAB — POCT INR: INR: 2.1

## 2015-09-19 NOTE — Telephone Encounter (Signed)
Message is to remain open until this shipment of Xolair is received.

## 2015-09-21 NOTE — Telephone Encounter (Signed)
Pt is aware that I can check into PAN to see about funds that may have become available now; if no funds then I will check with [employer].

## 2015-09-21 NOTE — Telephone Encounter (Signed)
Katie can you give an update on this? Thanks. 

## 2015-09-21 NOTE — Telephone Encounter (Signed)
#   Vials:2 Arrival Date:09/21/15 Lot [id number] Exp Date:4/20

## 2015-09-25 NOTE — Telephone Encounter (Signed)
Forwarding to Joellen Jersey to continue to follow up on.

## 2015-09-28 ENCOUNTER — Ambulatory Visit: Payer: Medicare PPO

## 2015-10-01 ENCOUNTER — Telehealth: Payer: Self-pay | Admitting: Internal Medicine

## 2015-10-01 NOTE — Telephone Encounter (Addendum)
Spoke with pt's niece. States that she called Access to [employer].  They are also out of founds. She called Thedora Hinders was told that if we sent over a letter of medical necessity for xolair they could possibly approve her for patient assistance. They told her the current one they have on file is expired. Reference number is [id number].

## 2015-10-01 NOTE — Telephone Encounter (Signed)
Please refer to previous phone note for patient. Thanks.

## 2015-10-01 NOTE — Telephone Encounter (Signed)
Spoke with [employer]-states patient's case is in review. [employer] has free meds the patient could possibly get. [employer] had to wait for patient auth form to come through which came through today and we are sending new SMN form to [facility] for Benefit Investigation and [employer] investigation.   Angie (pts niece) is aware that we should have information back by the end of this week for [employer] free med program.

## 2015-10-01 NOTE — Telephone Encounter (Signed)
PAN no longer has funds available at this time; pt can contact [place] at [phone] and speak with enroller for Access to [facility] group. They can go over information with patient and get approval over the phone.Thanks.

## 2015-10-01 NOTE — Telephone Encounter (Signed)
Niece called back to give a fax number to PAN. It is [phone] You can call her back at [phone]

## 2015-10-01 NOTE — Telephone Encounter (Signed)
Ria Comment has already spoke with pt daughter and made aware of Katie's recs. Nothing further needed

## 2015-10-01 NOTE — Telephone Encounter (Signed)
Lorane Gell, CMA at 10/01/2015 9:16 AM     Status: Signed       Expand All Collapse All   PAN no longer has funds available at this time; pt can contact [place] at [phone] and speak with enroller for Access to [facility] group. They can go over information with patient and get approval over the phone.Thanks.          Angie is aware of the above information. Nothing further was needed at this time.

## 2015-10-09 NOTE — Telephone Encounter (Signed)
Forms were completed again; signed by CY, and re-faxed again today to [facility].

## 2015-10-09 NOTE — Telephone Encounter (Signed)
Katie please advise if you ever received this form.  Thanks!

## 2015-10-10 ENCOUNTER — Ambulatory Visit (INDEPENDENT_AMBULATORY_CARE_PROVIDER_SITE_OTHER): Payer: Medicare PPO | Admitting: *Deleted

## 2015-10-10 DIAGNOSIS — I4891 Unspecified atrial fibrillation: Secondary | ICD-10-CM

## 2015-10-10 DIAGNOSIS — Z5181 Encounter for therapeutic drug level monitoring: Secondary | ICD-10-CM | POA: Diagnosis not present

## 2015-10-10 LAB — POCT INR: INR: 2.4

## 2015-10-15 NOTE — Telephone Encounter (Signed)
[employer] calling to see how pt is going to be pilled give them a call back @ [phone] ref # [id number].[name]

## 2015-10-17 NOTE — Telephone Encounter (Signed)
Called and spoke with [place]. She states xolair was approved but that they need clarification on wether it needs to go to a speciality pharmacy or buy and bill.  Katie please advise

## 2015-10-18 ENCOUNTER — Telehealth: Payer: Self-pay | Admitting: Internal Medicine

## 2015-10-18 MED ORDER — SULFAMETHOXAZOLE-TRIMETHOPRIM 800-160 MG PO TABS: 1.0000 | ORAL_TABLET | Freq: Two times a day (BID) | ORAL | Status: DC

## 2015-10-18 NOTE — Telephone Encounter (Signed)
Offer septra DS (sulfa drug)   # 14, 1 twice daily

## 2015-10-18 NOTE — Telephone Encounter (Signed)
Spoke with pt's niece, Angie. Reports that pt is having lots of coughing and chest congestion. Onset was 1 week. Cough is producing yellow mucus.  Denies chest tightness, wheezing, SOB or fever. Has not tried any OTC medications. Would like an antibiotic to be called in. CY - please advise. Thanks.  Allergies  Allergen Reactions  . Amoxicillin-Pot Clavulanate   . Clarithromycin   . Clindamycin   . Doxycycline   . Penicillins    Current Outpatient Prescriptions on File Prior to Visit  Medication Sig Dispense Refill  . albuterol (PROVENTIL HFA;VENTOLIN HFA) 108 (90 BASE) MCG/ACT inhaler Inhale 2 puffs into the lungs every 6 (six) hours as needed for wheezing or shortness of breath. 1 Inhaler 11  . albuterol (PROVENTIL) (2.5 MG/3ML) 0.083% nebulizer solution INHALE THE CONTENTS OF 1 VIAL (2.5MG /3ML) USING NEBULIZER EVERY 4 HOURS AS NEEDED FOR WHEEZING OR SHORTNESS OF BREATH. 90 mL 12  . atorvastatin (LIPITOR) 10 MG tablet Take 10 mg by mouth daily.      Marland Kitchen azelastine (ASTELIN) 0.1 % nasal spray Place 1-2 sprays into both nostrils 2 (two) times daily. Use in each nostril as directed 30 mL prn  . Calcium Carbonate-Vitamin D (CALTRATE 600+D) 600-400 MG-UNIT per tablet Take 1 tablet by mouth daily.      . cefdinir (OMNICEF) 300 MG capsule 1 twice daily 20 capsule 0  . cetirizine (ZYRTEC) 10 MG tablet Take 10 mg by mouth daily.    . citalopram (CELEXA) 40 MG tablet Take 1 tablet by mouth daily.    . diazepam (VALIUM) 2 MG tablet Take 2 mg by mouth 3 (three) times daily as needed for anxiety.    . diazepam (VALIUM) 5 MG tablet Take 1 tablet (5 mg total) by mouth every 12 (twelve) hours as needed for anxiety. 30 tablet 3  . digoxin (LANOXIN) 0.125 MG tablet TAKE 1 TABLET EVERY DAY 90 tablet 3  . diltiazem (CARDIZEM CD) 240 MG 24 hr capsule Take 1 capsule (240 mg total) by mouth daily. 90 capsule 3  . EPINEPHrine 0.3 mg/0.3 mL IJ SOAJ injection Inject 0.3 mLs (0.3 mg total) into the muscle once. 1  Device 11  . esomeprazole (NEXIUM) 40 MG capsule Take 40 mg by mouth 2 (two) times daily.      . Fluticasone Furoate-Vilanterol (BREO ELLIPTA) 200-25 MCG/INH AEPB Inhale 1 puff into the lungs daily. Rinse mouth 60 each 11  . furosemide (LASIX) 40 MG tablet TAKE 1 TABLET EVERY DAY 90 tablet 3  . mometasone (NASONEX) 50 MCG/ACT nasal spray Place 2 sprays into the nose as needed (congestion/allergies).     Marland Kitchen omalizumab (XOLAIR) 150 MG injection Inject 300 mg into the skin every 28 (twenty-eight) days. 2 vial 2  . potassium chloride SA (KLOR-CON M20) 20 MEQ tablet Take 1 tablet (20 mEq total) by mouth daily. 30 tablet 6  . traMADol (ULTRAM) 50 MG tablet TAKE 1 TABLET BY MOUTH EVERY 4 HOURS AS NEEDED 90 tablet 2  . warfarin (COUMADIN) 5 MG tablet TAKE 1 TABLET (5MG ) EVERY DAY EXCEPT ON WEDNESDAYS TAKE 1 AND 1/2 TABLETS (7.5MG ) 98 tablet 3   No current facility-administered medications on file prior to visit.

## 2015-10-18 NOTE — Telephone Encounter (Signed)
Called spoke with pt niece Angie. Aware of recs. RX sent in. Nothing further needed

## 2015-10-19 NOTE — Telephone Encounter (Signed)
Spoke with [employer]-states that they are trying to reach out to patient again to inform her that her account has been sent to PAN(patient assistance network) for co-pay assistance since PAN recently opened up again for funds. Nothing further needed from our office.

## 2015-10-19 NOTE — Telephone Encounter (Signed)
[phone] ref # [id number] xolair calling back

## 2015-10-23 ENCOUNTER — Telehealth: Payer: Self-pay | Admitting: *Deleted

## 2015-10-23 NOTE — Telephone Encounter (Signed)
Told pt to hold coumadin tonight due to abx therapy and come for INR check tomorrow.  She verbalized understanding.

## 2015-10-23 NOTE — Telephone Encounter (Signed)
Patient has been put on Sulfamethoxazole-TMP DS tab BID for 7 days. Started on Friday. / tg

## 2015-10-24 ENCOUNTER — Ambulatory Visit (INDEPENDENT_AMBULATORY_CARE_PROVIDER_SITE_OTHER): Payer: Medicare PPO | Admitting: *Deleted

## 2015-10-24 DIAGNOSIS — Z5181 Encounter for therapeutic drug level monitoring: Secondary | ICD-10-CM

## 2015-10-24 DIAGNOSIS — I4891 Unspecified atrial fibrillation: Secondary | ICD-10-CM

## 2015-10-24 LAB — POCT INR: INR: 4.3

## 2015-10-25 DIAGNOSIS — E876 Hypokalemia: Secondary | ICD-10-CM | POA: Diagnosis not present

## 2015-10-25 DIAGNOSIS — N2581 Secondary hyperparathyroidism of renal origin: Secondary | ICD-10-CM | POA: Diagnosis not present

## 2015-10-25 DIAGNOSIS — N183 Chronic kidney disease, stage 3 (moderate): Secondary | ICD-10-CM | POA: Diagnosis not present

## 2015-10-25 DIAGNOSIS — D631 Anemia in chronic kidney disease: Secondary | ICD-10-CM | POA: Diagnosis not present

## 2015-10-25 DIAGNOSIS — I129 Hypertensive chronic kidney disease with stage 1 through stage 4 chronic kidney disease, or unspecified chronic kidney disease: Secondary | ICD-10-CM | POA: Diagnosis not present

## 2015-10-26 ENCOUNTER — Encounter: Payer: Self-pay | Admitting: Cardiology

## 2015-10-26 ENCOUNTER — Ambulatory Visit (INDEPENDENT_AMBULATORY_CARE_PROVIDER_SITE_OTHER): Payer: Medicare PPO | Admitting: Cardiology

## 2015-10-26 VITALS — BP 106/66 | HR 59 | Ht 66.0 in | Wt 169.0 lb

## 2015-10-26 DIAGNOSIS — I482 Chronic atrial fibrillation, unspecified: Secondary | ICD-10-CM

## 2015-10-26 DIAGNOSIS — I059 Rheumatic mitral valve disease, unspecified: Secondary | ICD-10-CM | POA: Diagnosis not present

## 2015-10-26 DIAGNOSIS — I429 Cardiomyopathy, unspecified: Secondary | ICD-10-CM

## 2015-10-26 NOTE — Progress Notes (Signed)
Cardiology Office Note  Date: 10/26/2015   ID: [name], DOB 12/22/1933, MRN [id number]  PCP: Purvis Kilts, MD  Primary Cardiologist: [name], MD   Chief Complaint  Patient presents with  . Atrial Fibrillation  . Cardiomyopathy    History of Present Illness: [name] is a 82 y.o. female last seen in November 2016. At the last visit we increased Cardizem CD back to 240 mg daily and continued Lanoxin as well as Coumadin. She is here today with her daughter for a follow-up visit. Her main complaint is of chronic hoarseness and recurring bronchitis. She has been hesitant to see her prior ENT provider, somewhat scared about what they might find. Otherwise, she reports that her heart rate is much better controlled following medication adjustments. She feels chronically fatigued, no change in dyspnea on exertion.  Precordial heart rate today was in the 80s in atrial fibrillation.  Echocardiogram from October 2016 showed LVEF 30-35% with moderate mitral regurgitation.  Past Medical History  Diagnosis Date  . Asthma   . Atrial fibrillation ([place])   . COPD (chronic obstructive pulmonary disease) ([place])   . GERD (gastroesophageal reflux disease)   . Mitral valve prolapse   . Nonischemic cardiomyopathy ([facility])     LVEF 25-30% improved to 45-50%  . Chronic rhinitis   . Obstructive sleep apnea   . Transudative pleural effusion   . Nasal polyps   . Mitral regurgitation     Current Outpatient Prescriptions  Medication Sig Dispense Refill  . albuterol (PROVENTIL HFA;VENTOLIN HFA) 108 (90 BASE) MCG/ACT inhaler Inhale 2 puffs into the lungs every 6 (six) hours as needed for wheezing or shortness of breath. 1 Inhaler 11  . albuterol (PROVENTIL) (2.5 MG/3ML) 0.083% nebulizer solution INHALE THE CONTENTS OF 1 VIAL (2.5MG /3ML) USING NEBULIZER EVERY 4 HOURS AS NEEDED FOR WHEEZING OR SHORTNESS OF BREATH. 90 mL 12  . atorvastatin (LIPITOR) 10 MG tablet Take 10 mg by mouth  daily.      Marland Kitchen azelastine (ASTELIN) 0.1 % nasal spray Place 1-2 sprays into both nostrils 2 (two) times daily. Use in each nostril as directed 30 mL prn  . Calcium Carbonate-Vitamin D (CALTRATE 600+D) 600-400 MG-UNIT per tablet Take 1 tablet by mouth daily.      . cefdinir (OMNICEF) 300 MG capsule 1 twice daily 20 capsule 0  . cetirizine (ZYRTEC) 10 MG tablet Take 10 mg by mouth daily.    . citalopram (CELEXA) 40 MG tablet Take 1 tablet by mouth daily.    . diazepam (VALIUM) 2 MG tablet Take 2 mg by mouth 3 (three) times daily as needed for anxiety.    . diazepam (VALIUM) 5 MG tablet Take 1 tablet (5 mg total) by mouth every 12 (twelve) hours as needed for anxiety. 30 tablet 3  . digoxin (LANOXIN) 0.125 MG tablet TAKE 1 TABLET EVERY DAY 90 tablet 3  . diltiazem (CARDIZEM CD) 240 MG 24 hr capsule Take 1 capsule (240 mg total) by mouth daily. 90 capsule 3  . EPINEPHrine 0.3 mg/0.3 mL IJ SOAJ injection Inject 0.3 mLs (0.3 mg total) into the muscle once. 1 Device 11  . esomeprazole (NEXIUM) 40 MG capsule Take 40 mg by mouth 2 (two) times daily.      . Fluticasone Furoate-Vilanterol (BREO ELLIPTA) 200-25 MCG/INH AEPB Inhale 1 puff into the lungs daily. Rinse mouth 60 each 11  . furosemide (LASIX) 40 MG tablet TAKE 1 TABLET EVERY DAY 90 tablet 3  .  mometasone (NASONEX) 50 MCG/ACT nasal spray Place 2 sprays into the nose as needed (congestion/allergies).     Marland Kitchen omalizumab (XOLAIR) 150 MG injection Inject 300 mg into the skin every 28 (twenty-eight) days. 2 vial 2  . potassium chloride SA (KLOR-CON M20) 20 MEQ tablet Take 1 tablet (20 mEq total) by mouth daily. 30 tablet 6  . sulfamethoxazole-trimethoprim (BACTRIM DS,SEPTRA DS) 800-160 MG tablet Take 1 tablet by mouth 2 (two) times daily. 14 tablet 0  . traMADol (ULTRAM) 50 MG tablet TAKE 1 TABLET BY MOUTH EVERY 4 HOURS AS NEEDED 90 tablet 2  . warfarin (COUMADIN) 5 MG tablet TAKE 1 TABLET (5MG ) EVERY DAY EXCEPT ON WEDNESDAYS TAKE 1 AND 1/2 TABLETS (7.5MG )  98 tablet 3   No current facility-administered medications for this visit.   Allergies:  Amoxicillin-pot clavulanate; Clarithromycin; Clindamycin; Doxycycline; and Penicillins   Social History: The patient  reports that she quit smoking about 35 years ago. Her smoking use included Cigarettes. She has a 10 pack-year smoking history. She has never used smokeless tobacco. She reports that she does not drink alcohol or use illicit drugs.   ROS:  Please see the history of present illness. Otherwise, complete review of systems is positive for chronic hoarseness, chronic dyspnea on exertion. Reports good appetite. All other systems are reviewed and negative.   Physical Exam: VS:  BP 106/66 mmHg  Pulse 59  Ht 5\' 6"  (1.676 m)  Wt 169 lb (76.658 kg)  BMI 27.29 kg/m2  SpO2 94%, BMI Body mass index is 27.29 kg/(m^2).  Wt Readings from Last 3 Encounters:  10/26/15 169 lb (76.658 kg)  07/26/15 166 lb 3.2 oz (75.388 kg)  07/16/15 170 lb 3.2 oz (77.202 kg)    Elderly woman, appears comfortable at rest.  HEENT: Lids normal, conjunctiva normal, oropharynx clear.  Neck: Supple, no elevated JVP or carotid bruits, no thyromegaly.  Lungs: Scattered rhonchi with decreased breath sounds, no labored breathing.  Cardiac: Irregularly irregular, rapid, soft apical systolic murmur, no pericardial rub.  Abdomen: Soft, nontender, bowel sound present.  Skin: Warm and dry.  Extremities: No pitting edema below the knees.   ECG: ECG is not ordered today.  Other Studies Reviewed Today:  Echocardiogram 07/04/2015: Study Conclusions  - Left ventricle: The cavity size was normal. Wall thickness was normal. Systolic function was moderately to severely reduced. The estimated ejection fraction was in the range of 30% to 35%. Diffuse hypokinesis. There is severe hypokinesis of the mid-apicalanteroseptal myocardium. The study is not technically sufficient to allow evaluation of LV diastolic  function. - Aortic valve: Mildly calcified annulus. Trileaflet. - Mitral valve: Calcified annulus. Moderately thickened leaflets . Moderate prolapse, involving the anterior leaflet and the posterior leaflet. There was moderate regurgitation directed posteriorly. - Left atrium: The atrium was severely dilated. - Right atrium: The atrium was moderately dilated. Central venous pressure (est): 3 mm Hg. - Tricuspid valve: There was mild regurgitation. - Pulmonary arteries: Systolic pressure could not be accurately estimated. - Pericardium, extracardiac: There was no pericardial effusion.  Impressions:  - Rhythm appears to be atrial fibrillation with rapid ventricular response. Normal LV wall thickness with LVEF approximately 30-35%, diffuse hypokinesis, most prominent in the distal anteroseptal wall. Indeterminate diastolic function. Severe left atrial enlargement. Moderate to severe MAC with moderately thickened mitral leaflets, moderate bileaflet prolapse, and moderate posteriorly directed mitral regurgitation. Mildly sclerotic aortic valve. Normal RV contraction. Mild tricuspid regurgitation, unable to assess PASP. Since the prior study in November 2015, there has been  reduction in LVEF.  Assessment and Plan:  1. Chronic atrial fibrillation, heart rate much better controlled at this time on present doses of Cardizem CD and Lanoxin. She also continues on Coumadin.  2. Nonischemic cardiomyopathy with LVEF 30-35%, possibly tachycardia mediated. She had improved LVEF in the past. Now that her heart rate control is more optimal, we will obtain a follow-up echocardiogram.  3. Mitral valve prolapse with moderate mitral regurgitation.  Current medicines were reviewed with the patient today.   Orders Placed This Encounter  Procedures  . Echocardiogram    Disposition: FU with me in 6 months.   Signed, Satira Sark, MD, [facility] 10/26/2015 11:59 AM      [facility] [address] [place], [place] [address] Phone: [phone]; Fax: [phone]

## 2015-10-26 NOTE — Patient Instructions (Signed)
Your physician wants you to follow-up in: 6 months with Dr McDowell You will receive a reminder letter in the mail two months in advance. If you don't receive a letter, please call our office to schedule the follow-up appointment.    Your physician recommends that you continue on your current medications as directed. Please refer to the Current Medication list given to you today.    Your physician has requested that you have an echocardiogram. Echocardiography is a painless test that uses sound waves to create images of your heart. It provides your doctor with information about the size and shape of your heart and how well your heart's chambers and valves are working. This procedure takes approximately one hour. There are no restrictions for this procedure.     Thank you for choosing [facility] !        

## 2015-10-29 ENCOUNTER — Ambulatory Visit (INDEPENDENT_AMBULATORY_CARE_PROVIDER_SITE_OTHER): Payer: Medicare PPO | Admitting: *Deleted

## 2015-10-29 ENCOUNTER — Ambulatory Visit (INDEPENDENT_AMBULATORY_CARE_PROVIDER_SITE_OTHER): Payer: Medicare PPO

## 2015-10-29 DIAGNOSIS — Z5181 Encounter for therapeutic drug level monitoring: Secondary | ICD-10-CM

## 2015-10-29 DIAGNOSIS — J4540 Moderate persistent asthma, uncomplicated: Secondary | ICD-10-CM | POA: Diagnosis not present

## 2015-10-29 DIAGNOSIS — I4891 Unspecified atrial fibrillation: Secondary | ICD-10-CM

## 2015-10-29 LAB — POCT INR: INR: 2.4

## 2015-10-30 ENCOUNTER — Ambulatory Visit (HOSPITAL_COMMUNITY): Payer: Medicare PPO

## 2015-10-30 DIAGNOSIS — R490 Dysphonia: Secondary | ICD-10-CM | POA: Diagnosis not present

## 2015-10-30 DIAGNOSIS — J383 Other diseases of vocal cords: Secondary | ICD-10-CM | POA: Diagnosis not present

## 2015-10-30 DIAGNOSIS — K219 Gastro-esophageal reflux disease without esophagitis: Secondary | ICD-10-CM | POA: Diagnosis not present

## 2015-10-30 DIAGNOSIS — R05 Cough: Secondary | ICD-10-CM | POA: Diagnosis not present

## 2015-10-30 DIAGNOSIS — J329 Chronic sinusitis, unspecified: Secondary | ICD-10-CM | POA: Diagnosis not present

## 2015-10-30 MED ORDER — OMALIZUMAB 150 MG ~~LOC~~ SOLR
300.0000 mg | Freq: Once | SUBCUTANEOUS | Status: AC
  Administered 2015-10-29: 300 mg via SUBCUTANEOUS

## 2015-10-31 ENCOUNTER — Telehealth: Payer: Self-pay | Admitting: *Deleted

## 2015-10-31 ENCOUNTER — Telehealth: Payer: Self-pay | Admitting: Internal Medicine

## 2015-10-31 NOTE — Telephone Encounter (Signed)
Called spoke with Angie (pt daughter). She reports pt saw ENT Dr. Erik Obey and thinks she could possibly have yeast on vocal cords but did not see anything else. He asked them to contact us about pt breo if this could be causing her to have hoarseness. She reports pt has had hoarseness for couple months now. Please advise Dr. Annamaria Boots thanks

## 2015-10-31 NOTE — Telephone Encounter (Signed)
Pt took 1st dose of fluconazole yesterday.  She will take 2nd dose on 2/25 and 3rd dose on 3/1.  INR appt moved up to 2/27.  Advised her to eat extra Vit K+ foods until then.

## 2015-10-31 NOTE — Telephone Encounter (Signed)
Called spoke with pt daughter [name]. She is going to call insurance to see of symbicort/dulera is covered.  Will await call back

## 2015-10-31 NOTE — Telephone Encounter (Signed)
If her insurance will cover either Symbicort or Dulera instead of Breo, then we could try using an Aerochamber with the device to see if that helps.

## 2015-10-31 NOTE — Telephone Encounter (Signed)
Pt has started on Fluconazole 150mg  1 pill every 3 days and is wondering if she'll need to come in and have her blood checked

## 2015-11-01 NOTE — Telephone Encounter (Signed)
lmtcb x1 for Angie. We need to let her know about the aerochamber.

## 2015-11-01 NOTE — Telephone Encounter (Signed)
Attempted to contact Angie - no answer, no voicemail, will call back. Need to know what pharmacy to send Symbicort/spacer too.

## 2015-11-01 NOTE — Telephone Encounter (Signed)
please send rx to [facility] [address], per Angie [phone]

## 2015-11-01 NOTE — Telephone Encounter (Signed)
Spoke with pt's niece, Angie. States that Symbicort is covered. They would like for a prescription to be sent in.  CY - please advise on what strength. Thanks.

## 2015-11-01 NOTE — Telephone Encounter (Signed)
LMOM TCB x1 Pt can come here to pick up an aerochamber and receive instruction Would still like to verify pharmacy for Symbicort rx

## 2015-11-01 NOTE — Telephone Encounter (Signed)
Symbicort 80, # 1,  Inhale 2 puffs then rinse mouth, twice daily  Ref x 12  Also needs Aerochamber for use with Symbicort    With instruction

## 2015-11-02 ENCOUNTER — Other Ambulatory Visit: Payer: Self-pay

## 2015-11-02 MED ORDER — DILTIAZEM HCL ER COATED BEADS 240 MG PO CP24: 240.0000 mg | ORAL_CAPSULE | Freq: Every day | ORAL | Status: DC

## 2015-11-02 MED ORDER — BUDESONIDE-FORMOTEROL FUMARATE 80-4.5 MCG/ACT IN AERO: 2.0000 | INHALATION_SPRAY | Freq: Two times a day (BID) | RESPIRATORY_TRACT | Status: DC

## 2015-11-02 NOTE — Telephone Encounter (Signed)
Refill complete 

## 2015-11-02 NOTE — Telephone Encounter (Signed)
Called and spoke Angie. Informed her of the medication change. Rx sent to preferred pharmacy. Angie states she would like to see if the pharmacy carries the spacer instead of getting one from [place], then will let us know. Will await call back from Angie.

## 2015-11-05 ENCOUNTER — Encounter: Payer: Self-pay | Admitting: Gastroenterology

## 2015-11-05 ENCOUNTER — Ambulatory Visit (INDEPENDENT_AMBULATORY_CARE_PROVIDER_SITE_OTHER): Payer: Medicare PPO | Admitting: *Deleted

## 2015-11-05 ENCOUNTER — Telehealth: Payer: Self-pay | Admitting: Internal Medicine

## 2015-11-05 DIAGNOSIS — I4891 Unspecified atrial fibrillation: Secondary | ICD-10-CM | POA: Diagnosis not present

## 2015-11-05 DIAGNOSIS — Z5181 Encounter for therapeutic drug level monitoring: Secondary | ICD-10-CM

## 2015-11-05 LAB — POCT INR: INR: 2.9

## 2015-11-05 NOTE — Telephone Encounter (Signed)
Left message for patient to call back  

## 2015-11-05 NOTE — Telephone Encounter (Signed)
atc pt's niece, Angie- line rang several times, no answer, no vm.  Wcb.

## 2015-11-06 ENCOUNTER — Other Ambulatory Visit: Payer: Self-pay

## 2015-11-06 MED ORDER — DILTIAZEM HCL ER COATED BEADS 240 MG PO CP24: 240.0000 mg | ORAL_CAPSULE | Freq: Every day | ORAL | Status: DC

## 2015-11-06 NOTE — Telephone Encounter (Signed)
Sent in refills for Rx

## 2015-11-06 NOTE — Telephone Encounter (Signed)
Spoke with Angie, states that pt has a spacer at home already, so nothing further is needed.  Will close message.

## 2015-11-06 NOTE — Telephone Encounter (Signed)
lmtcb X2 for Angie

## 2015-11-06 NOTE — Telephone Encounter (Signed)
Duplicate message.  See 2/22 phone note.

## 2015-11-12 ENCOUNTER — Ambulatory Visit (HOSPITAL_COMMUNITY)
Admission: RE | Admit: 2015-11-12 | Discharge: 2015-11-12 | Disposition: A | Discharge status: Home / Self Care | Payer: Medicare PPO | Source: Ambulatory Visit | Attending: Cardiology | Admitting: Cardiology

## 2015-11-12 DIAGNOSIS — I517 Cardiomegaly: Secondary | ICD-10-CM | POA: Diagnosis not present

## 2015-11-12 DIAGNOSIS — G4733 Obstructive sleep apnea (adult) (pediatric): Secondary | ICD-10-CM | POA: Insufficient documentation

## 2015-11-12 DIAGNOSIS — I358 Other nonrheumatic aortic valve disorders: Secondary | ICD-10-CM | POA: Insufficient documentation

## 2015-11-12 DIAGNOSIS — I340 Nonrheumatic mitral (valve) insufficiency: Secondary | ICD-10-CM | POA: Diagnosis not present

## 2015-11-12 DIAGNOSIS — I429 Cardiomyopathy, unspecified: Secondary | ICD-10-CM | POA: Insufficient documentation

## 2015-11-12 DIAGNOSIS — I050 Rheumatic mitral stenosis: Secondary | ICD-10-CM | POA: Insufficient documentation

## 2015-11-14 ENCOUNTER — Telehealth: Payer: Self-pay | Admitting: Internal Medicine

## 2015-11-14 ENCOUNTER — Other Ambulatory Visit: Payer: Self-pay

## 2015-11-14 MED ORDER — DILTIAZEM HCL ER COATED BEADS 240 MG PO CP24: 240.0000 mg | ORAL_CAPSULE | Freq: Every day | ORAL | Status: DC

## 2015-11-14 NOTE — Telephone Encounter (Signed)
Called and spoke with Angie. She called to make Katie aware that she received a call from Medicare stating that the pt's Arvid Right was approved with Medicare Part D. She was unsure if anything else needed to be done. I informed her I would send the message to [facility] and if anything further was needed our office would contact her. She voiced understanding and had no further questions.  FYI to [employer]

## 2015-11-15 ENCOUNTER — Other Ambulatory Visit: Payer: Self-pay

## 2015-11-15 MED ORDER — DILTIAZEM HCL ER COATED BEADS 240 MG PO CP24: 240.0000 mg | ORAL_CAPSULE | Freq: Every day | ORAL | Status: DC

## 2015-11-15 NOTE — Telephone Encounter (Signed)
Spoke with Angie and notified of the below  Will forward back to [place] to document when she calls [place] tomorrow

## 2015-11-15 NOTE — Telephone Encounter (Signed)
Refill complete 

## 2015-11-15 NOTE — Telephone Encounter (Signed)
Please let Angie know that I have rec'd denial for Xolair under patient's Medicare Part D- states Xolair is covered under Medicare Part B for 2 years. Let Angie know that I will speak with [employer] tomorrow (as I am with a provider today) and will get all details worked out. Thanks.

## 2015-11-15 NOTE — Telephone Encounter (Signed)
Katie please advise if anything further is needed, thanks

## 2015-11-19 ENCOUNTER — Other Ambulatory Visit: Payer: Self-pay

## 2015-11-19 MED ORDER — DILTIAZEM HCL ER COATED BEADS 240 MG PO CP24: 240.0000 mg | ORAL_CAPSULE | Freq: Every day | ORAL | Status: DC

## 2015-11-19 NOTE — Telephone Encounter (Signed)
refill complete 

## 2015-11-20 NOTE — Telephone Encounter (Signed)
[name] been contacted?

## 2015-11-21 NOTE — Telephone Encounter (Signed)
Spoke with [employer] rep; states the patient was approved later part of Feb 2017 under Medicare for buy and bill or specialty pharmacy-they are unsure why they were given different outcome than we were. Manus Rudd is faxing over the benefits summary to my attention to get information and call patients Insurance to see which way is required.

## 2015-11-23 NOTE — Telephone Encounter (Signed)
Katie please advise if forms have been recieved

## 2015-11-26 ENCOUNTER — Ambulatory Visit (INDEPENDENT_AMBULATORY_CARE_PROVIDER_SITE_OTHER): Payer: Medicare PPO | Admitting: *Deleted

## 2015-11-26 ENCOUNTER — Ambulatory Visit: Payer: Medicare PPO

## 2015-11-26 DIAGNOSIS — I4891 Unspecified atrial fibrillation: Secondary | ICD-10-CM | POA: Diagnosis not present

## 2015-11-26 DIAGNOSIS — Z5181 Encounter for therapeutic drug level monitoring: Secondary | ICD-10-CM

## 2015-11-26 LAB — POCT INR: INR: 3.9

## 2015-11-26 NOTE — Telephone Encounter (Signed)
Forms have been received from [place]. I will have to review her benefits and inform patient and her niece about Insurance requirements.

## 2015-11-29 ENCOUNTER — Other Ambulatory Visit: Payer: Self-pay

## 2015-11-29 MED ORDER — DILTIAZEM HCL ER COATED BEADS 240 MG PO CP24: 240.0000 mg | ORAL_CAPSULE | Freq: Every day | ORAL | Status: DC

## 2015-11-29 NOTE — Telephone Encounter (Signed)
Refill complete 

## 2015-12-04 ENCOUNTER — Ambulatory Visit: Payer: Medicare PPO

## 2015-12-05 DIAGNOSIS — I509 Heart failure, unspecified: Secondary | ICD-10-CM | POA: Diagnosis not present

## 2015-12-05 DIAGNOSIS — E782 Mixed hyperlipidemia: Secondary | ICD-10-CM | POA: Diagnosis not present

## 2015-12-05 DIAGNOSIS — E663 Overweight: Secondary | ICD-10-CM | POA: Diagnosis not present

## 2015-12-05 DIAGNOSIS — E119 Type 2 diabetes mellitus without complications: Secondary | ICD-10-CM | POA: Diagnosis not present

## 2015-12-05 DIAGNOSIS — Z6827 Body mass index (BMI) 27.0-27.9, adult: Secondary | ICD-10-CM | POA: Diagnosis not present

## 2015-12-05 DIAGNOSIS — Z1389 Encounter for screening for other disorder: Secondary | ICD-10-CM | POA: Diagnosis not present

## 2015-12-05 DIAGNOSIS — Z0001 Encounter for general adult medical examination with abnormal findings: Secondary | ICD-10-CM | POA: Diagnosis not present

## 2015-12-05 DIAGNOSIS — I10 Essential (primary) hypertension: Secondary | ICD-10-CM | POA: Diagnosis not present

## 2015-12-06 ENCOUNTER — Ambulatory Visit (INDEPENDENT_AMBULATORY_CARE_PROVIDER_SITE_OTHER): Payer: Medicare PPO

## 2015-12-06 DIAGNOSIS — J4540 Moderate persistent asthma, uncomplicated: Secondary | ICD-10-CM

## 2015-12-06 MED ORDER — OMALIZUMAB 150 MG ~~LOC~~ SOLR
300.0000 mg | Freq: Once | SUBCUTANEOUS | Status: AC
  Administered 2015-12-06: 300 mg via SUBCUTANEOUS

## 2015-12-10 ENCOUNTER — Ambulatory Visit (INDEPENDENT_AMBULATORY_CARE_PROVIDER_SITE_OTHER): Payer: Medicare PPO | Admitting: *Deleted

## 2015-12-10 DIAGNOSIS — Z5181 Encounter for therapeutic drug level monitoring: Secondary | ICD-10-CM | POA: Diagnosis not present

## 2015-12-10 DIAGNOSIS — I4891 Unspecified atrial fibrillation: Secondary | ICD-10-CM

## 2015-12-10 LAB — POCT INR: INR: 3.4

## 2015-12-12 ENCOUNTER — Telehealth: Payer: Self-pay

## 2015-12-12 NOTE — Telephone Encounter (Signed)
#   vials:2 Ordered date:12/12/15 Shipping Date:12/14/15

## 2015-12-13 NOTE — Telephone Encounter (Signed)
[place], LM for [place] to return call.

## 2015-12-13 NOTE — Telephone Encounter (Signed)
lmtcb for [place] with [employer]

## 2015-12-13 NOTE — Telephone Encounter (Signed)
[place] returned call [phone].

## 2015-12-14 ENCOUNTER — Telehealth: Payer: Self-pay | Admitting: Internal Medicine

## 2015-12-14 NOTE — Telephone Encounter (Signed)
Will have Tammy Scott confirm on Monday 12-17-15

## 2015-12-17 DIAGNOSIS — C44319 Basal cell carcinoma of skin of other parts of face: Secondary | ICD-10-CM | POA: Diagnosis not present

## 2015-12-17 NOTE — Telephone Encounter (Signed)
#   vials:2 Ordered date:07/18/16 Shipping Date:07/18/16  The order [name] placed 12/12/15, the rx hadn't went through. So I called today and ordered xolair.

## 2015-12-17 NOTE — Telephone Encounter (Signed)
This pt's xolair order is being handled in another telephone note.  I've printed off this message to give to allergy team lead to follow up on.  Will close message at her request.

## 2015-12-17 NOTE — Telephone Encounter (Addendum)
My bad, I didn't realize this encounter was about Dulera until I had typed the note. The only reason I put it in this one is the [employer] text.(the first entry in this note.) I will leave encounter open for dulera. Sorry!!

## 2015-12-17 NOTE — Telephone Encounter (Signed)
Pt. xolair was not delivered on 12/14/15 b/c rx had not went through. Xolair has been order and will be del. 12/18/15. Leaving encounter open so I can rec. When xolair comes in.

## 2015-12-18 NOTE — Telephone Encounter (Signed)
#   Vials:2 Arrival Date:12/18/15 Lot [id number] Exp Date:9/20

## 2015-12-31 ENCOUNTER — Ambulatory Visit (INDEPENDENT_AMBULATORY_CARE_PROVIDER_SITE_OTHER): Payer: Medicare PPO | Admitting: *Deleted

## 2015-12-31 DIAGNOSIS — Z5181 Encounter for therapeutic drug level monitoring: Secondary | ICD-10-CM

## 2015-12-31 DIAGNOSIS — I4891 Unspecified atrial fibrillation: Secondary | ICD-10-CM

## 2015-12-31 LAB — POCT INR: INR: 3.3

## 2016-01-06 ENCOUNTER — Other Ambulatory Visit: Payer: Self-pay | Admitting: Internal Medicine

## 2016-01-07 ENCOUNTER — Ambulatory Visit: Payer: Medicare PPO

## 2016-01-10 ENCOUNTER — Telehealth: Payer: Self-pay

## 2016-01-10 NOTE — Telephone Encounter (Signed)
#   vials:2 Ordered date:01/10/2016  Shipping Date:01/11/16

## 2016-01-11 NOTE — Telephone Encounter (Signed)
#   Vials:2 Arrival Date:01/11/16 Lot [id number] Exp Date:9/20

## 2016-01-14 ENCOUNTER — Ambulatory Visit (INDEPENDENT_AMBULATORY_CARE_PROVIDER_SITE_OTHER): Payer: Medicare PPO

## 2016-01-14 ENCOUNTER — Encounter: Payer: Self-pay | Admitting: Internal Medicine

## 2016-01-14 ENCOUNTER — Ambulatory Visit (INDEPENDENT_AMBULATORY_CARE_PROVIDER_SITE_OTHER): Payer: Medicare PPO | Admitting: Internal Medicine

## 2016-01-14 VITALS — BP 122/62 | HR 99 | Ht 66.5 in | Wt 170.8 lb

## 2016-01-14 DIAGNOSIS — J4540 Moderate persistent asthma, uncomplicated: Secondary | ICD-10-CM

## 2016-01-14 DIAGNOSIS — J4489 Other specified chronic obstructive pulmonary disease: Secondary | ICD-10-CM

## 2016-01-14 DIAGNOSIS — J449 Chronic obstructive pulmonary disease, unspecified: Secondary | ICD-10-CM

## 2016-01-14 DIAGNOSIS — J45909 Unspecified asthma, uncomplicated: Secondary | ICD-10-CM

## 2016-01-14 MED ORDER — OMALIZUMAB 150 MG ~~LOC~~ SOLR
300.0000 mg | Freq: Once | SUBCUTANEOUS | Status: AC
  Administered 2016-01-14: 300 mg via SUBCUTANEOUS

## 2016-01-14 NOTE — Patient Instructions (Signed)
Please follow-up with Dr Erik Obey as planned about your voice. He may want you to work with a Astronomer  We can continue Xolair and current nmeds  Good luck with your skin surgery  Please call if we can help

## 2016-01-14 NOTE — Progress Notes (Signed)
Patient ID: [name], female    DOB: [birth year], 82 y.o.   MRN: [id number]  HPI 7/113/12-  76 yoF former smoker, followed for Chronic asthma/ COPD, chronic rhinosinusitis, complicated by hx AFib/ anticoagulation, CHF, cardiomyopathy, mitral insufficiency, GERD Last here December 11/19/10- note reviewed She has felt well controlled with no acute breathing issues. She saw Dr Domenic Polite for cardiac f/u 2 weeks ago and Echo was good with no med changes. Uses rescue inhaler only occasionally, usually on first rising. Little wheeze at times, much better. Little DOE with ADLs PFT 12/06/10= chronic obstructive asthma   7/113/12-  76 yoF former smoker, followed for Chronic asthma/ COPD, chronic rhinosinusitis, complicated by hx AFib/ anticoagulation, CHF, cardiomyopathy, mitral insufficiency, GERD Has had flu vaccine. Persistent head and chest congestion for the past 2 weeks. On November 7 we sent  Avelox for 5 days. This helped but didn't last long enough. She took a leftover prednisone taper but wants to avoid more prednisone now if possible. She has noted mild tremor in her hands for the past month no lateralizing finding. She continues Xolair injections. Using nebulizer or rescue inhaler only every 1 or 2 days. Has lost some weight and admits she has been dieting some but otherwise she's not sure that explains it. Thyroid function test normal in the past.  11/27/11- 76 yoF former smoker, followed for Chronic asthma/ COPD, chronic rhinosinusitis, complicated by hx AFib/ anticoagulation, CHF, cardiomyopathy, mitral insufficiency, GERD, Lung nodules Continues to do well. Continues Xolair. She thyroid nodule biopsy by interventional radiology. Waiting for results. Eyes have been watering some increased nasal drainage. We discussed possibility of seasonal allergic symptoms when she is getting Xolair. CT chest 10/20/2011-bilateral nodules less than 10 mm. Needs followup CT scan in May.  02/05/12- 76 yoF  former smoker, followed for Chronic asthma/ COPD, chronic rhinosinusitis, complicated by hx AFib/ anticoagulation, CHF, cardiomyopathy, mitral insufficiency, GERD, Lung nodules Denies any cough, congestion, SOB, or wheezing, Slight flare up with allergies at this time and still on Xolair. CAT score 8 She is pleased to be holding her current weight. Sometimes has a little wheeze but not much rarely needs a rescue inhaler and uses her nebulizer 2 or 3 times per week. Continues Xolair and believes it helps. Denies reflux or heartburn. CT chest  01/29/12- images reviewed with her. IMPRESSION:  1. Scattered bilateral pulmonary nodules are unchanged in size  from 10/22/2011. Additional follow-up CT chest without contrast  could be performed in 6 months to ensure continued stability, as  clinically indicated. This recommendation follows the consensus  statement: Guidelines for Management of Small Pulmonary Nodules  Detected on CT Scans: A Statement from the [facility] as  published in Radiology 2005; 237:395-400. If multidisciplinary  follow up management is desired, this is available in the [facility] through the [facility] [phone].  2. Dominant right thyroid nodule. Consider further evaluation with  thyroid ultrasound. If patient is clinically hyperthyroid,  consider nuclear medicine thyroid uptake and scan.  Original Report Authenticated By: Luretha Rued, M.D.    08/09/12- 34 yoF former smoker, followed for Chronic asthma/ COPD, chronic rhinosinusitis, complicated by hx AFib/ anticoagulation, CHF, cardiomyopathy, mitral insufficiency, GERD, Lung nodules FOLLOWS FOR: denies any SOB, wheezing or cough. States she is having chest/throat congestion. For 2 weeks has had "congestion" in throat and chest. Dr. Hilma Favors treated Levaquin and it one week ago. She denies purulent discharge, fever or wheeze. Echocardiogram 05/03/2012-further improved. EF  60-65% with mitral regurgitation and mitral valve prolapse. Chest refill Valium use occasionally. CT 08/04/12-reviewed with her IMPRESSION:  1. Stable bilateral pulmonary nodules. No new pulmonary nodules  are noted. A follow-up examination in 1 year is recommended to  assure stability.  2. Stable 2 cm low density nodule in the right lobe of the thyroid  gland.  3. No acute infiltrate or pulmonary edema. Small hiatal hernia  again noted.  Original Report Authenticated By: Lahoma Crocker, M.D.  02/07/13- 47 yoF former smoker, followed for Chronic asthma/ COPD, chronic rhinosinusitis, complicated by hx AFib/ anticoagulation, CHF, cardiomyopathy, mitral insufficiency, GERD, Lung nodules FOLLOWS FOR: still on Xolair; drainage in throat. Denies any flare ups with asthma or allergies at this time. Taking Zyrtec. Some postnasal drip. Had thyroid nodule biopsied.  08/11/13- 79 yoF former smoker, followed for Chronic asthma/ COPD, chronic rhinosinusitis, complicated by hx AFib/ anticoagulation, CHF, cardiomyopathy, mitral insufficiency, GERD, Lung nodules FOLLOWS FOR: has had shingles for aobut 4 weeks; still on Xolair and plans to get injection for December as she did not take November's injection. Shingles right lateral rib cage- valacyclovir Rx'd. Still hurts. It is not needing oxygen. CT 07/15/13  IMPRESSION:  1. No acute cardiopulmonary abnormalities.  2. Stable nodules from 10/22/2011. These are unchanged for almost 19  months and are likely benign.  3. Atherosclerotic disease.  4. Hiatal hernia status post fundoplication.  Electronically Signed  By: Kerby Moors M.D.  On: 07/14/2013 13:04  12/01/13- 79 yoF former smoker, followed for Chronic asthma/ COPD, chronic rhinosinusitis, complicated by hx AFib/ anticoagulation, CHF, cardiomyopathy, mitral insufficiency, GERD, Lung nodules ACUTE VISIT: stuffy head, coughing so hard (mainly at night) that she isnt sleeping. Productive at times-white in  color. Due for Xolair today however working with Insurance to approve. Ended prednisone 10 mg daily maintenance 3 weeks ago. Since then has felt more washed out and tired. Had lab work indicating no heart failure, but kidney function poor> Nephrology. Increased nasal congestion. Cough keeps her awake. Some wheeze. Using nebulizer 3 or 4 times daily, Xolair on schedule.  01/31/14- 79 yoF former smoker, followed for Chronic asthma/ COPD, chronic rhinosinusitis, Lung nodules complicated by hx AFib/ anticoagulation, CHF, cardiomyopathy, mitral insufficiency, GERD, CKD FOLLOWS FOR: Still on Xolair and doing well; no flare ups and no cough Says she is doing "great" now with no acute symptoms.  03/30/14-  79 yoF former smoker, followed for Chronic asthma/ COPD, chronic rhinosinusitis, Lung nodules complicated by hx AFib/ anticoagulation, CHF, cardiomyopathy, mitral insufficiency, GERD, CKD ACUTE VISIT: increased cough; SOB and wheezing. Had her Xolair injection on Friday 03-24-14 Recently on Avelox for 7 days for sinus infection from  PCP office as well as short dose of Pred . Does not feel sick. Sinusitis resolved. No headache. When she coughs her nose runs copious white clear mucus.  06/05/14-  80 yoF former smoker, followed for Chronic asthma/ COPD, chronic rhinosinusitis, Lung nodules complicated by hx AFib/ anticoagulation, CHF, cardiomyopathy, mitral insufficiency, GERD, CKD     Xolair FOLLOWS FOR: still on Xolair and doing well on it. Flu shot today as well. Albuterol neb needs ot go through [employer] from now on. Minor allergy symptoms this summer with watery eyes, but nothing bad. Feels very well. Admits on questioning that she does notice a little wheeze. Uses nebulizer machine occasionally.  11/13/14-80 yoF former smoker, followed for Chronic asthma/ COPD, chronic rhinosinusitis, Lung nodules complicated by hx AFib/ anticoagulation, CHF, cardiomyopathy, mitral insufficiency, GERD,  CKD FOLLOW FOR:  Asthma- Xolair working well for her.  breathing not too bad today, little congestion.  no concerns today. Breathing feels good. Blames allergy for irritated eyes x 3 weeks- on Zyrtec now. Stopped prednisone 2 weeks ago  03/15/15- 80 yoF former smoker, followed for Chronic asthma/ COPD, chronic rhinosinusitis, Lung nodules complicated by hx AFib/ anticoagulation, CHF, cardiomyopathy, mitral insufficiency, GERD, CKD Reports: Continues xolair inj; asthma; SOB doing better since BREO;  Prod cough w/white mucus; yellow nasal drip started this am; drainage Denies headache, fever, sore throat, wheeze or chest tightness We reviewed x-ray images CXR 01/16/15- IMPRESSION: COPD without acute superimposed disease. Electronically Signed  By: Monte Fantasia M.D.  On: 01/16/2015 12:40  07/16/15- 80 yoF former smoker, followed for Chronic asthma/ COPD, chronic rhinosinusitis, Lung nodules complicated by hx AFib/ warfarin, CHF, cardiomyopathy, mitral insufficiency, GERD, CKD Xolair FOLLOWS FOR: Pt continues to stay hoarse(for about 3 months now); continues Xolair and no reactions; pt states Nasal Spray and Breo have worked well for her.  Had a cold 3 months ago with lingering laryngitis/hoarseness, persistent postnasal drainage, frontal headache, white/clear mucus. Azelastine nasal spray helps. Chest feels controlled without significant wheeze. Likes Breo. Cardiology and nephrology have been adjusting medication for heart rate. Echocardiogram-atrial fibrillation, LVEF 30-35%, diffuse hypokinesis  01/14/2016-82 year old female former smoker followed for chronic asthma/COPD, chronic rhinosinusitis, lung nodules complicated by history A. fib/anticoagulation, CHF, cardiomyopathy, mitral insufficiency, GERD, CAD Xolair FOLLOWS FOR: Still on Xolair and doing well; Pt still has no voice-has been to ENT-was told he seen sticky stuff-treating as yeast infection(about 1 month)-was given 3 days of  Diflucan and no better.   CXR 07/16/2015- IMPRESSION: Hyperinflation consistent with known reactive airway disease/COPD. Superimposed acute bronchitic change manifested by slightly increased interstitial densities at the lung bases is suspected. There is no alveolar pneumonia nor CHF. Electronically Signed  By: David Martinique M.D.  On: 07/16/2015 13:30  Review of Systems-see HPI Constitutional:    weight loss, night sweats, fevers, chills, +fatigue, lassitude. HEENT:   No-  headaches, difficulty swallowing, tooth/dental problems, sore throat,       No-  sneezing, itching, ear ache, no- nasal congestion, +post nasal drip,  CV:  No- chest pain, no-orthopnea, PND, swelling in lower extremities, anasarca, dizziness, palpitations Resp: +   shortness of breath with exertion or at rest.              No-   productive cough,  +non-productive cough,  No- coughing up of blood.              No-   change in color of mucus. + wheezing.   Skin: No-   rash or lesions. GI:  No-   heartburn, indigestion, abdominal pain, nausea, vomiting,  GU: . MS:  No-   joint pain or swelling.  Neuro-     nothing unusual Psych:  No- change in mood or affect. No depression or anxiety.  No memory loss.  Objective:   Physical Exam General- Alert, Oriented, Affect-appropriate/ cheerful, Distress- none acute, thin Skin- no rash visible now on right lateral ribs Lymphadenopathy- none Head- atraumatic            Eyes- Gross vision intact, PERRLA, +conjunctivae injected            Ears- Hearing, canals-normal            Nose- clear, turbinate edema, no-Septal dev, mucus, polyps, erosion, perforation             Throat- Mallampati III , mucosa  clear/ not red , drainage- none, tonsils- atrophic,  dentures.+ Mild hoarseness .                     + Large torus hard palate Neck- flexible , trachea midline, no stridor , thyroid nl, carotid no bruit Chest - symmetrical excursion , unlabored           Heart/CV- +IRR/ AFib   + faint ? Mitral regurg murmur , no gallop  , + HR 132                              no rub, nl s1 s2                           - JVD- none , edema- none, stasis changes- none, varices- none           Lung-  + mild bilateral rhonchi, unlabored, wheeze + expiratory, Cough+light , dullness-none, rub- none           Chest wall-  Abd-  Br/ Gen/ Rectal- Not done, not indicated Extrem- cyanosis- none, clubbing, none, atrophy- none, strength- nl Neuro- slight resting tremor

## 2016-01-21 ENCOUNTER — Ambulatory Visit (INDEPENDENT_AMBULATORY_CARE_PROVIDER_SITE_OTHER): Payer: Medicare PPO | Admitting: *Deleted

## 2016-01-21 DIAGNOSIS — I4891 Unspecified atrial fibrillation: Secondary | ICD-10-CM

## 2016-01-21 DIAGNOSIS — Z5181 Encounter for therapeutic drug level monitoring: Secondary | ICD-10-CM | POA: Diagnosis not present

## 2016-01-21 LAB — POCT INR: INR: 1.8

## 2016-01-24 DIAGNOSIS — I129 Hypertensive chronic kidney disease with stage 1 through stage 4 chronic kidney disease, or unspecified chronic kidney disease: Secondary | ICD-10-CM | POA: Diagnosis not present

## 2016-01-24 DIAGNOSIS — N2581 Secondary hyperparathyroidism of renal origin: Secondary | ICD-10-CM | POA: Diagnosis not present

## 2016-01-24 DIAGNOSIS — N183 Chronic kidney disease, stage 3 (moderate): Secondary | ICD-10-CM | POA: Diagnosis not present

## 2016-01-24 DIAGNOSIS — R809 Proteinuria, unspecified: Secondary | ICD-10-CM | POA: Diagnosis not present

## 2016-01-29 DIAGNOSIS — N183 Chronic kidney disease, stage 3 (moderate): Secondary | ICD-10-CM | POA: Diagnosis not present

## 2016-01-29 DIAGNOSIS — R809 Proteinuria, unspecified: Secondary | ICD-10-CM | POA: Diagnosis not present

## 2016-01-29 DIAGNOSIS — N2581 Secondary hyperparathyroidism of renal origin: Secondary | ICD-10-CM | POA: Diagnosis not present

## 2016-01-29 DIAGNOSIS — I129 Hypertensive chronic kidney disease with stage 1 through stage 4 chronic kidney disease, or unspecified chronic kidney disease: Secondary | ICD-10-CM | POA: Diagnosis not present

## 2016-02-05 ENCOUNTER — Telehealth: Payer: Self-pay | Admitting: Internal Medicine

## 2016-02-05 DIAGNOSIS — Z87891 Personal history of nicotine dependence: Secondary | ICD-10-CM | POA: Diagnosis not present

## 2016-02-05 DIAGNOSIS — D380 Neoplasm of uncertain behavior of larynx: Secondary | ICD-10-CM | POA: Diagnosis not present

## 2016-02-05 DIAGNOSIS — J383 Other diseases of vocal cords: Secondary | ICD-10-CM | POA: Diagnosis not present

## 2016-02-05 DIAGNOSIS — R490 Dysphonia: Secondary | ICD-10-CM | POA: Diagnosis not present

## 2016-02-05 NOTE — Telephone Encounter (Signed)
Called pt. Had to lmom. Explained to pt. Her next appt. Should be 02/11/16, and asked to go ahead and sch. Appt.. If you have any questions please call back and ask for [facility]. Leaving encounter open in hopes that pt. Calls back.

## 2016-02-07 NOTE — Telephone Encounter (Signed)
#   vials:2 My bad. I forgot to put order after I called Tues..  Ordered date:02/05/16 Shipping Date:02/07/16 Will leave encounter open to put in xolair tomorrow.

## 2016-02-07 NOTE — Telephone Encounter (Signed)
[name], it looks like pt's xolair shipment needs to be arranged.  Please advise.  Thanks!

## 2016-02-08 ENCOUNTER — Ambulatory Visit (INDEPENDENT_AMBULATORY_CARE_PROVIDER_SITE_OTHER): Payer: Medicare PPO | Admitting: *Deleted

## 2016-02-08 DIAGNOSIS — Z5181 Encounter for therapeutic drug level monitoring: Secondary | ICD-10-CM

## 2016-02-08 DIAGNOSIS — I4891 Unspecified atrial fibrillation: Secondary | ICD-10-CM

## 2016-02-08 LAB — POCT INR: INR: 1.5

## 2016-02-08 NOTE — Telephone Encounter (Signed)
#   Vials:2 Arrival Date:02/08/16 Lot [id number]  Exp Date:9/20

## 2016-02-09 ENCOUNTER — Other Ambulatory Visit: Payer: Self-pay | Admitting: Internal Medicine

## 2016-02-11 ENCOUNTER — Ambulatory Visit (INDEPENDENT_AMBULATORY_CARE_PROVIDER_SITE_OTHER): Payer: Medicare PPO

## 2016-02-11 DIAGNOSIS — J4540 Moderate persistent asthma, uncomplicated: Secondary | ICD-10-CM | POA: Diagnosis not present

## 2016-02-11 MED ORDER — OMALIZUMAB 150 MG ~~LOC~~ SOLR
300.0000 mg | Freq: Once | SUBCUTANEOUS | Status: AC
  Administered 2016-02-11: 300 mg via SUBCUTANEOUS

## 2016-02-11 NOTE — Telephone Encounter (Signed)
Ok to refill 6 months 

## 2016-02-11 NOTE — Telephone Encounter (Signed)
CY Please advise on refill. Thanks.  

## 2016-02-18 ENCOUNTER — Ambulatory Visit (INDEPENDENT_AMBULATORY_CARE_PROVIDER_SITE_OTHER): Payer: Medicare PPO | Admitting: *Deleted

## 2016-02-18 DIAGNOSIS — Z5181 Encounter for therapeutic drug level monitoring: Secondary | ICD-10-CM

## 2016-02-18 DIAGNOSIS — I4891 Unspecified atrial fibrillation: Secondary | ICD-10-CM | POA: Diagnosis not present

## 2016-02-18 LAB — POCT INR: INR: 1.7

## 2016-02-23 ENCOUNTER — Other Ambulatory Visit: Payer: Self-pay | Admitting: Internal Medicine

## 2016-02-25 NOTE — Telephone Encounter (Signed)
Ok to refill x 6 months total 

## 2016-02-25 NOTE — Telephone Encounter (Signed)
CY Please advise on refill. Thanks.  

## 2016-02-26 ENCOUNTER — Telehealth: Payer: Self-pay | Admitting: Internal Medicine

## 2016-02-26 NOTE — Telephone Encounter (Signed)
Spoke with Angie and advised that refill was called in this morning. Nothing further needed.

## 2016-02-26 NOTE — Telephone Encounter (Signed)
Rx has been called in. Nothing further was needed.

## 2016-03-03 ENCOUNTER — Ambulatory Visit (INDEPENDENT_AMBULATORY_CARE_PROVIDER_SITE_OTHER): Payer: Medicare PPO | Admitting: *Deleted

## 2016-03-03 DIAGNOSIS — I4891 Unspecified atrial fibrillation: Secondary | ICD-10-CM | POA: Diagnosis not present

## 2016-03-03 DIAGNOSIS — Z5181 Encounter for therapeutic drug level monitoring: Secondary | ICD-10-CM | POA: Diagnosis not present

## 2016-03-03 LAB — POCT INR: INR: 1.6

## 2016-03-05 ENCOUNTER — Telehealth: Payer: Self-pay | Admitting: *Deleted

## 2016-03-05 NOTE — Telephone Encounter (Signed)
error 

## 2016-03-10 ENCOUNTER — Ambulatory Visit (INDEPENDENT_AMBULATORY_CARE_PROVIDER_SITE_OTHER): Payer: Medicare PPO | Admitting: *Deleted

## 2016-03-10 DIAGNOSIS — I4891 Unspecified atrial fibrillation: Secondary | ICD-10-CM | POA: Diagnosis not present

## 2016-03-10 DIAGNOSIS — Z5181 Encounter for therapeutic drug level monitoring: Secondary | ICD-10-CM

## 2016-03-10 LAB — POCT INR: INR: 1.3

## 2016-03-12 ENCOUNTER — Ambulatory Visit: Payer: Medicare PPO

## 2016-03-12 ENCOUNTER — Other Ambulatory Visit: Payer: Self-pay | Admitting: Cardiology

## 2016-03-13 ENCOUNTER — Ambulatory Visit (INDEPENDENT_AMBULATORY_CARE_PROVIDER_SITE_OTHER): Payer: Medicare PPO

## 2016-03-13 DIAGNOSIS — J4540 Moderate persistent asthma, uncomplicated: Secondary | ICD-10-CM | POA: Diagnosis not present

## 2016-03-14 ENCOUNTER — Ambulatory Visit: Payer: Medicare PPO | Admitting: Cardiology

## 2016-03-14 MED ORDER — OMALIZUMAB 150 MG ~~LOC~~ SOLR
300.0000 mg | Freq: Once | SUBCUTANEOUS | Status: AC
  Administered 2016-03-13: 300 mg via SUBCUTANEOUS

## 2016-03-17 ENCOUNTER — Other Ambulatory Visit: Payer: Self-pay | Admitting: Internal Medicine

## 2016-03-18 DIAGNOSIS — Z6826 Body mass index (BMI) 26.0-26.9, adult: Secondary | ICD-10-CM | POA: Diagnosis not present

## 2016-03-18 DIAGNOSIS — E663 Overweight: Secondary | ICD-10-CM | POA: Diagnosis not present

## 2016-03-18 DIAGNOSIS — J382 Nodules of vocal cords: Secondary | ICD-10-CM | POA: Diagnosis not present

## 2016-03-18 DIAGNOSIS — Z1389 Encounter for screening for other disorder: Secondary | ICD-10-CM | POA: Diagnosis not present

## 2016-03-19 NOTE — Telephone Encounter (Signed)
Patient requesting refill on Tramadol. Last refill: 08/15/15 Last OV: 01/14/16 Next OV: 07/16/16  Ok to refill?  Allergies  Allergen Reactions  . Amoxicillin-Pot Clavulanate   . Clarithromycin   . Clindamycin   . Doxycycline   . Penicillins    Current Outpatient Prescriptions on File Prior to Visit  Medication Sig Dispense Refill  . albuterol (PROVENTIL HFA;VENTOLIN HFA) 108 (90 BASE) MCG/ACT inhaler Inhale 2 puffs into the lungs every 6 (six) hours as needed for wheezing or shortness of breath. 1 Inhaler 11  . albuterol (PROVENTIL) (2.5 MG/3ML) 0.083% nebulizer solution INHALE THE CONTENTS OF 1 VIAL (2.5MG /3ML) USING NEBULIZER EVERY 4 HOURS AS NEEDED FOR WHEEZING OR SHORTNESS OF BREATH. 90 mL 12  . atorvastatin (LIPITOR) 10 MG tablet Take 10 mg by mouth daily.      Marland Kitchen azelastine (ASTELIN) 0.1 % nasal spray Place 1-2 sprays into both nostrils 2 (two) times daily. Use in each nostril as directed 30 mL prn  . BREO ELLIPTA 200-25 MCG/INH AEPB INHALE 1 PUFF INTO LUNGS DAILY, RINSE MOUTH AFTER 60 each 7  . Calcium Carbonate-Vitamin D (CALTRATE 600+D) 600-400 MG-UNIT per tablet Take 1 tablet by mouth daily.      . cetirizine (ZYRTEC) 10 MG tablet Take 10 mg by mouth daily.    . citalopram (CELEXA) 40 MG tablet Take 1 tablet by mouth daily.    . diazepam (VALIUM) 5 MG tablet TAKE 1 TABLET BY MOUTH EVERY 12 HOURS AS NEEDED 30 tablet 5  . digoxin (LANOXIN) 0.125 MG tablet TAKE 1 TABLET EVERY DAY 90 tablet 3  . diltiazem (CARDIZEM CD) 240 MG 24 hr capsule Take 1 capsule (240 mg total) by mouth daily. 90 capsule 3  . EPINEPHrine 0.3 mg/0.3 mL IJ SOAJ injection Inject 0.3 mLs (0.3 mg total) into the muscle once. 1 Device 11  . esomeprazole (NEXIUM) 40 MG capsule Take 40 mg by mouth 2 (two) times daily.      . furosemide (LASIX) 40 MG tablet TAKE 1 TABLET EVERY DAY 90 tablet 3  . omalizumab (XOLAIR) 150 MG injection Inject 300 mg into the skin every 28 (twenty-eight) days. 2 vial 2  . potassium  chloride SA (KLOR-CON M20) 20 MEQ tablet Take 1 tablet (20 mEq total) by mouth daily. 30 tablet 6  . traMADol (ULTRAM) 50 MG tablet TAKE 1 TABLET BY MOUTH EVERY 4 HOURS AS NEEDED 90 tablet 2  . warfarin (COUMADIN) 5 MG tablet TAKE 1 TABLET (5MG ) EVERY DAY EXCEPT ON WEDNESDAYS TAKE 1 AND 1/2 TABLETS (7.5MG ) 98 tablet 3   No current facility-administered medications on file prior to visit.

## 2016-03-20 NOTE — Telephone Encounter (Signed)
Ok to refill 6 months 

## 2016-03-24 ENCOUNTER — Ambulatory Visit (INDEPENDENT_AMBULATORY_CARE_PROVIDER_SITE_OTHER): Payer: Medicare PPO | Admitting: *Deleted

## 2016-03-24 DIAGNOSIS — I4891 Unspecified atrial fibrillation: Secondary | ICD-10-CM

## 2016-03-24 DIAGNOSIS — Z5181 Encounter for therapeutic drug level monitoring: Secondary | ICD-10-CM | POA: Diagnosis not present

## 2016-03-24 LAB — POCT INR: INR: 1.7

## 2016-03-25 ENCOUNTER — Encounter: Payer: Self-pay | Admitting: Cardiology

## 2016-03-25 ENCOUNTER — Ambulatory Visit (INDEPENDENT_AMBULATORY_CARE_PROVIDER_SITE_OTHER): Payer: Medicare PPO | Admitting: Cardiology

## 2016-03-25 VITALS — BP 106/60 | HR 65 | Ht 68.0 in | Wt 169.0 lb

## 2016-03-25 DIAGNOSIS — Z01810 Encounter for preprocedural cardiovascular examination: Secondary | ICD-10-CM | POA: Diagnosis not present

## 2016-03-25 DIAGNOSIS — I059 Rheumatic mitral valve disease, unspecified: Secondary | ICD-10-CM

## 2016-03-25 DIAGNOSIS — I482 Chronic atrial fibrillation, unspecified: Secondary | ICD-10-CM

## 2016-03-25 DIAGNOSIS — I429 Cardiomyopathy, unspecified: Secondary | ICD-10-CM | POA: Diagnosis not present

## 2016-03-25 NOTE — Progress Notes (Signed)
Cardiology Office Note  Date: 03/25/2016   ID: [name], DOB 07-22-1934, MRN [id number]  PCP: Purvis Kilts, MD  Primary Cardiologist: [name], MD   Chief Complaint  Patient presents with  . Preoperative evaluation    History of Present Illness: [name] is a 81 y.o. female last seen in February. She is here today with her granddaughter. Referred for preoperative evaluation. She is being considered for ENT surgery with Dr. Erik Obey, laryngoscopy with biopsy and possible laser ablation. There is concern based on discussion with patient that she may have cancerous lesions. She states that she has spoken in detail with Dr. Hilma Favors and has decided not to pursue any further surgery at this time however.  From a cardiac perspective she has been stable, no palpitations on current regimen. Heart rate control of atrial fibrillation has been good, and she continues on Coumadin with follow-up in the anticoagulation clinic. No significant bleeding problems.  She had an echocardiogram done earlier this year which showed normalization of LVEF following improvement in heart rate control.  Past Medical History  Diagnosis Date  . Asthma   . Atrial fibrillation ([facility])   . COPD (chronic obstructive pulmonary disease) ([facility])   . GERD (gastroesophageal reflux disease)   . Mitral valve prolapse   . Nonischemic cardiomyopathy ([facility])     LVEF 25-30% improved to 45-50%  . Chronic rhinitis   . Obstructive sleep apnea   . Transudative pleural effusion   . Nasal polyps   . Mitral regurgitation     Past Surgical History  Procedure Laterality Date  . Laparoscopic nissen fundoplication  [id number]  . Appendectomy  2000  . Nose surgery    . Bilateral cataract surgery      Current Outpatient Prescriptions  Medication Sig Dispense Refill  . albuterol (PROVENTIL HFA;VENTOLIN HFA) 108 (90 BASE) MCG/ACT inhaler Inhale 2 puffs into the lungs every 6 (six) hours as needed for wheezing or  shortness of breath. 1 Inhaler 11  . albuterol (PROVENTIL) (2.5 MG/3ML) 0.083% nebulizer solution INHALE THE CONTENTS OF 1 VIAL (2.5MG /3ML) USING NEBULIZER EVERY 4 HOURS AS NEEDED FOR WHEEZING OR SHORTNESS OF BREATH. 90 mL 12  . atorvastatin (LIPITOR) 10 MG tablet Take 10 mg by mouth daily.      Marland Kitchen azelastine (ASTELIN) 0.1 % nasal spray Place 1-2 sprays into both nostrils 2 (two) times daily. Use in each nostril as directed 30 mL prn  . BREO ELLIPTA 200-25 MCG/INH AEPB INHALE 1 PUFF INTO LUNGS DAILY, RINSE MOUTH AFTER 60 each 7  . Calcium Carbonate-Vitamin D (CALTRATE 600+D) 600-400 MG-UNIT per tablet Take 1 tablet by mouth daily.      . cetirizine (ZYRTEC) 10 MG tablet Take 10 mg by mouth daily.    . citalopram (CELEXA) 40 MG tablet Take 1 tablet by mouth daily.    . diazepam (VALIUM) 5 MG tablet TAKE 1 TABLET BY MOUTH EVERY 12 HOURS AS NEEDED 30 tablet 5  . digoxin (LANOXIN) 0.125 MG tablet TAKE 1 TABLET EVERY DAY 90 tablet 3  . diltiazem (CARDIZEM CD) 240 MG 24 hr capsule Take 1 capsule (240 mg total) by mouth daily. 90 capsule 3  . EPINEPHrine 0.3 mg/0.3 mL IJ SOAJ injection Inject 0.3 mLs (0.3 mg total) into the muscle once. 1 Device 11  . esomeprazole (NEXIUM) 40 MG capsule Take 40 mg by mouth 2 (two) times daily.      . furosemide (LASIX) 40 MG tablet TAKE 1 TABLET EVERY  DAY 90 tablet 3  . omalizumab (XOLAIR) 150 MG injection Inject 300 mg into the skin every 28 (twenty-eight) days. 2 vial 2  . potassium chloride SA (KLOR-CON M20) 20 MEQ tablet Take 1 tablet (20 mEq total) by mouth daily. 30 tablet 6  . traMADol (ULTRAM) 50 MG tablet TAKE 1 TABLET EVERY 4 HOURS AS NEEDED 90 tablet 5  . warfarin (COUMADIN) 5 MG tablet TAKE 1 TABLET (5MG ) EVERY DAY EXCEPT ON WEDNESDAYS TAKE 1 AND 1/2 TABLETS (7.5MG ) 98 tablet 3   No current facility-administered medications for this visit.   Allergies:  Amoxicillin-pot clavulanate; Clarithromycin; Clindamycin; Doxycycline; and Penicillins   Social History:  The patient  reports that she quit smoking about 35 years ago. Her smoking use included Cigarettes. She has a 10 pack-year smoking history. She has never used smokeless tobacco. She reports that she does not drink alcohol or use illicit drugs.   ROS:  Please see the history of present illness. Otherwise, complete review of systems is positive for chronic hoarseness.  All other systems are reviewed and negative.   Physical Exam: VS:  BP 106/60 mmHg  Pulse 65  Ht 5\' 8"  (1.727 m)  Wt 169 lb (76.658 kg)  BMI 25.70 kg/m2  SpO2 97%, BMI Body mass index is 25.7 kg/(m^2).  Wt Readings from Last 3 Encounters:  03/25/16 169 lb (76.658 kg)  01/14/16 170 lb 12.8 oz (77.474 kg)  10/26/15 169 lb (76.658 kg)    Elderly woman, appears comfortable at rest.  HEENT: Lids normal, conjunctiva normal, oropharynx clear.  Neck: Supple, no elevated JVP or carotid bruits, no thyromegaly.  Lungs: Scattered rhonchi with decreased breath sounds, no labored breathing.  Cardiac: Irregularly irregular, rapid, soft apical systolic murmur, no pericardial rub.  Abdomen: Soft, nontender, bowel sound present.  Skin: Warm and dry.  Extremities: No pitting edema below the knees.   ECG: I personally reviewed the tracing from 01/03/2015 which showed rate-controlled atrial fibrillation with low voltage and nonspecific ST changes.  Other Studies Reviewed Today:  Echocardiogram 11/12/2015: Study Conclusions  - Left ventricle: The cavity size was normal. Wall thickness was  increased in a pattern of mild LVH. Systolic function was normal.  The estimated ejection fraction was in the range of 55% to 60%.  Wall motion was normal; there were no regional wall motion  abnormalities. - Aortic valve: Mildly calcified annulus. Trileaflet; mildly  thickened leaflets. Valve area (VTI): 1.89 cm^2. Valve area  (Vmax): 1.89 cm^2. - Mitral valve: Moderately calcified annulus. Mildly thickened  leaflets . The findings are  consistent with moderate stenosis.  There was mild to moderate regurgitation. Mean gradient (D): 7 mm  Hg. Peak gradient (D): 19 mm Hg. In setting of afib unable to  accurately calculate MV area by PHT or continuity equation. By  gradient there is moderate mitral stenosis. - Left atrium: The atrium was severely dilated. - Right atrium: The atrium was mildly dilated.  Assessment and Plan:  1. Preoperative evaluation. Patient has decided not to pursue further testing or surgery with ENT. If she were to change her mind or need to pursue this, she has been stable from a cardiac perspective and should be able to proceed. This would need to be done off Coumadin if biopsies were obtained. For now we are making no changes in her regimen.  2. Chronic atrial fibrillation, continue strategy of heart rate control and anticoagulation. She is on Lanoxin and Cardizem CD. Continues on Coumadin for stroke prophylaxis.  3.  History of tachycardia-mediated cardiomyopathy with normalization of LVEF by recent follow-up echocardiogram. LVEF 55-60% range.  4. Mitral regurgitation, mild to moderate with associated moderate mitral stenosis by recent evaluation. Continue medical therapy and conservative follow-up.  Current medicines were reviewed with the patient today.   Orders Placed This Encounter  Procedures  . EKG 12-Lead    Disposition: Follow-up with me in 6 months.  Signed, Satira Sark, MD, [facility] 03/25/2016 12:10 PM    [facility] [address], [place], [place] [address] Phone: [phone]; Fax: [phone]

## 2016-03-25 NOTE — Patient Instructions (Signed)
Your physician wants you to follow-up in: 6 months with Dr. McDowell You will receive a reminder letter in the mail two months in advance. If you don't receive a letter, please call our office to schedule the follow-up appointment.  Your physician recommends that you continue on your current medications as directed. Please refer to the Current Medication list given to you today.  Thank you for choosing [facility]!!    

## 2016-03-27 ENCOUNTER — Telehealth: Payer: Self-pay | Admitting: Internal Medicine

## 2016-03-27 NOTE — Telephone Encounter (Signed)
#   vials:2 Ordered date:03/27/16 Had to give verbal rx. Pharm.    2nd Date: 03/31/16 Shipping Date: 04/01/16  # Vials:2 Arrival Date:04/01/16 Lot [id number] Exp Date:11/20

## 2016-04-01 ENCOUNTER — Telehealth: Payer: Self-pay | Admitting: Internal Medicine

## 2016-04-01 NOTE — Telephone Encounter (Signed)
#   Vials:2 Arrival Date:04/01/16 Lot [id number] Exp Date:11/20

## 2016-04-01 NOTE — Telephone Encounter (Signed)
#   Vials:2 Arrival Date:04/01/16 Lot [id number] Exp Date:11/20

## 2016-04-07 ENCOUNTER — Ambulatory Visit (INDEPENDENT_AMBULATORY_CARE_PROVIDER_SITE_OTHER): Payer: Medicare PPO | Admitting: *Deleted

## 2016-04-07 DIAGNOSIS — Z5181 Encounter for therapeutic drug level monitoring: Secondary | ICD-10-CM | POA: Diagnosis not present

## 2016-04-07 DIAGNOSIS — I4891 Unspecified atrial fibrillation: Secondary | ICD-10-CM | POA: Diagnosis not present

## 2016-04-07 LAB — POCT INR: INR: 1.7

## 2016-04-10 ENCOUNTER — Ambulatory Visit (INDEPENDENT_AMBULATORY_CARE_PROVIDER_SITE_OTHER): Payer: Medicare PPO

## 2016-04-10 DIAGNOSIS — J4540 Moderate persistent asthma, uncomplicated: Secondary | ICD-10-CM | POA: Diagnosis not present

## 2016-04-10 MED ORDER — OMALIZUMAB 150 MG ~~LOC~~ SOLR
300.0000 mg | SUBCUTANEOUS | Status: DC
  Administered 2016-04-10: 300 mg via SUBCUTANEOUS

## 2016-04-16 ENCOUNTER — Ambulatory Visit (INDEPENDENT_AMBULATORY_CARE_PROVIDER_SITE_OTHER): Payer: Medicare PPO | Admitting: *Deleted

## 2016-04-16 DIAGNOSIS — Z5181 Encounter for therapeutic drug level monitoring: Secondary | ICD-10-CM | POA: Diagnosis not present

## 2016-04-16 DIAGNOSIS — I4891 Unspecified atrial fibrillation: Secondary | ICD-10-CM

## 2016-04-16 LAB — POCT INR: INR: 1.9

## 2016-04-23 ENCOUNTER — Telehealth: Payer: Self-pay | Admitting: Internal Medicine

## 2016-04-23 NOTE — Telephone Encounter (Signed)
#   vials:2 Ordered date:04/23/16 Shipping Date:04/29/16

## 2016-04-28 ENCOUNTER — Ambulatory Visit (INDEPENDENT_AMBULATORY_CARE_PROVIDER_SITE_OTHER): Payer: Medicare PPO | Admitting: *Deleted

## 2016-04-28 DIAGNOSIS — Z5181 Encounter for therapeutic drug level monitoring: Secondary | ICD-10-CM | POA: Diagnosis not present

## 2016-04-28 DIAGNOSIS — I4891 Unspecified atrial fibrillation: Secondary | ICD-10-CM

## 2016-04-28 LAB — POCT INR: INR: 2.6

## 2016-04-30 NOTE — Telephone Encounter (Signed)
#   Vials:2 Arrival Date:04/30/16 Lot [id number] Exp Date:11/20

## 2016-05-13 ENCOUNTER — Ambulatory Visit: Payer: Medicare PPO

## 2016-05-15 ENCOUNTER — Ambulatory Visit: Payer: Medicare PPO

## 2016-05-19 ENCOUNTER — Ambulatory Visit (INDEPENDENT_AMBULATORY_CARE_PROVIDER_SITE_OTHER): Payer: Medicare PPO | Admitting: *Deleted

## 2016-05-19 DIAGNOSIS — Z5181 Encounter for therapeutic drug level monitoring: Secondary | ICD-10-CM | POA: Diagnosis not present

## 2016-05-19 DIAGNOSIS — I4891 Unspecified atrial fibrillation: Secondary | ICD-10-CM | POA: Diagnosis not present

## 2016-05-19 LAB — POCT INR: INR: 1.8

## 2016-05-27 ENCOUNTER — Ambulatory Visit: Payer: Medicare PPO

## 2016-05-28 ENCOUNTER — Ambulatory Visit (INDEPENDENT_AMBULATORY_CARE_PROVIDER_SITE_OTHER): Payer: Medicare PPO

## 2016-05-28 DIAGNOSIS — J4540 Moderate persistent asthma, uncomplicated: Secondary | ICD-10-CM

## 2016-05-28 MED ORDER — OMALIZUMAB 150 MG ~~LOC~~ SOLR
300.0000 mg | SUBCUTANEOUS | Status: DC
Start: 2016-05-28 — End: 2016-07-08
  Administered 2016-05-28: 300 mg via SUBCUTANEOUS

## 2016-06-02 ENCOUNTER — Ambulatory Visit (INDEPENDENT_AMBULATORY_CARE_PROVIDER_SITE_OTHER): Payer: Medicare PPO | Admitting: *Deleted

## 2016-06-02 DIAGNOSIS — I4891 Unspecified atrial fibrillation: Secondary | ICD-10-CM | POA: Diagnosis not present

## 2016-06-02 DIAGNOSIS — Z5181 Encounter for therapeutic drug level monitoring: Secondary | ICD-10-CM | POA: Diagnosis not present

## 2016-06-02 LAB — POCT INR: INR: 1.9

## 2016-06-05 DIAGNOSIS — Z6825 Body mass index (BMI) 25.0-25.9, adult: Secondary | ICD-10-CM | POA: Diagnosis not present

## 2016-06-05 DIAGNOSIS — J209 Acute bronchitis, unspecified: Secondary | ICD-10-CM | POA: Diagnosis not present

## 2016-06-05 DIAGNOSIS — E663 Overweight: Secondary | ICD-10-CM | POA: Diagnosis not present

## 2016-06-05 DIAGNOSIS — J069 Acute upper respiratory infection, unspecified: Secondary | ICD-10-CM | POA: Diagnosis not present

## 2016-06-12 ENCOUNTER — Telehealth: Payer: Self-pay | Admitting: *Deleted

## 2016-06-12 NOTE — Telephone Encounter (Signed)
Patient was started on Levaquin for 10 days last Friday. / tg

## 2016-06-12 NOTE — Telephone Encounter (Signed)
Told pt to hold coumadin tonight.  Resume regular coumadin dose tomorrow and come for INR check on 06/16/16.  She verbalized understanding.

## 2016-06-13 ENCOUNTER — Telehealth: Payer: Self-pay | Admitting: Internal Medicine

## 2016-06-13 NOTE — Telephone Encounter (Signed)
#   vials:2 Ordered date:06/13/16 Shipping Date:06/23/16

## 2016-06-16 ENCOUNTER — Ambulatory Visit (INDEPENDENT_AMBULATORY_CARE_PROVIDER_SITE_OTHER): Payer: Medicare PPO | Admitting: *Deleted

## 2016-06-16 DIAGNOSIS — I4891 Unspecified atrial fibrillation: Secondary | ICD-10-CM | POA: Diagnosis not present

## 2016-06-16 DIAGNOSIS — Z5181 Encounter for therapeutic drug level monitoring: Secondary | ICD-10-CM | POA: Diagnosis not present

## 2016-06-16 LAB — POCT INR: INR: 2.4

## 2016-06-24 NOTE — Telephone Encounter (Signed)
#   Vials:2 Arrival Date:07/25/16 Lot #:[id number] Exp Date:3/21

## 2016-06-27 ENCOUNTER — Ambulatory Visit: Payer: Medicare PPO

## 2016-06-30 ENCOUNTER — Ambulatory Visit (INDEPENDENT_AMBULATORY_CARE_PROVIDER_SITE_OTHER): Payer: Medicare PPO

## 2016-06-30 DIAGNOSIS — J4540 Moderate persistent asthma, uncomplicated: Secondary | ICD-10-CM | POA: Diagnosis not present

## 2016-07-07 ENCOUNTER — Ambulatory Visit (INDEPENDENT_AMBULATORY_CARE_PROVIDER_SITE_OTHER): Payer: Medicare PPO | Admitting: *Deleted

## 2016-07-07 DIAGNOSIS — Z5181 Encounter for therapeutic drug level monitoring: Secondary | ICD-10-CM

## 2016-07-07 DIAGNOSIS — I4891 Unspecified atrial fibrillation: Secondary | ICD-10-CM | POA: Diagnosis not present

## 2016-07-07 LAB — POCT INR: INR: 4.2

## 2016-07-08 MED ORDER — OMALIZUMAB 150 MG ~~LOC~~ SOLR
300.0000 mg | Freq: Once | SUBCUTANEOUS | Status: AC
  Administered 2016-06-30: 300 mg via SUBCUTANEOUS

## 2016-07-15 ENCOUNTER — Telehealth: Payer: Self-pay | Admitting: Internal Medicine

## 2016-07-15 NOTE — Telephone Encounter (Signed)
Called and spoke with [facility] and scheduled the [place] shipment for 07/24/16.  Nothing further needed. Will forward to [employer] so that she is aware

## 2016-07-16 ENCOUNTER — Encounter: Payer: Self-pay | Admitting: Internal Medicine

## 2016-07-16 ENCOUNTER — Ambulatory Visit: Payer: Medicare PPO | Admitting: Internal Medicine

## 2016-07-16 ENCOUNTER — Telehealth: Payer: Self-pay | Admitting: Internal Medicine

## 2016-07-16 ENCOUNTER — Other Ambulatory Visit (INDEPENDENT_AMBULATORY_CARE_PROVIDER_SITE_OTHER): Payer: Medicare PPO

## 2016-07-16 ENCOUNTER — Ambulatory Visit (INDEPENDENT_AMBULATORY_CARE_PROVIDER_SITE_OTHER): Payer: Medicare PPO | Admitting: Internal Medicine

## 2016-07-16 VITALS — BP 120/62 | HR 111 | Ht 66.5 in | Wt 165.4 lb

## 2016-07-16 DIAGNOSIS — C329 Malignant neoplasm of larynx, unspecified: Secondary | ICD-10-CM

## 2016-07-16 DIAGNOSIS — R490 Dysphonia: Secondary | ICD-10-CM

## 2016-07-16 DIAGNOSIS — Z01812 Encounter for preprocedural laboratory examination: Secondary | ICD-10-CM

## 2016-07-16 DIAGNOSIS — I482 Chronic atrial fibrillation, unspecified: Secondary | ICD-10-CM

## 2016-07-16 DIAGNOSIS — J449 Chronic obstructive pulmonary disease, unspecified: Secondary | ICD-10-CM

## 2016-07-16 DIAGNOSIS — G4733 Obstructive sleep apnea (adult) (pediatric): Secondary | ICD-10-CM

## 2016-07-16 LAB — BASIC METABOLIC PANEL
BUN: 28 mg/dL — ABNORMAL HIGH (ref 6–23)
CALCIUM: 9.1 mg/dL (ref 8.4–10.5)
CO2: 31 mEq/L (ref 19–32)
Chloride: 101 mEq/L (ref 96–112)
Creatinine, Ser: 1.44 mg/dL — ABNORMAL HIGH (ref 0.40–1.20)
GFR: 37.21 mL/min — AB (ref >60.00)
GLUCOSE: 92 mg/dL (ref 70–99)
Potassium: 4.2 mEq/L (ref 3.5–5.1)
SODIUM: 137 meq/L (ref 135–145)

## 2016-07-16 NOTE — Telephone Encounter (Signed)
Thanks for letting me know, so I could put the refill in the system. Done

## 2016-07-16 NOTE — Assessment & Plan Note (Signed)
From her description, ENT evaluation this summer had considerable concern about possible CA larynx. She was anxious because her brother had laryngectomy and x-ray therapy for this condition and she doesn't want to go through it. She is also unsure of her ability to tolerate anesthesia for a biopsy procedure. I talked with her about benefit of knowing diagnosis, regardless of what she chooses to do about it therapeutically. She also would need to think about hospice, feeding tube, etc. Plan-CT scan soft tissues neck

## 2016-07-16 NOTE — Telephone Encounter (Signed)
#   vials:2 Ordered date:07/15/16 Shipping Date:07/23/16 Order by Magda Paganini.

## 2016-07-16 NOTE — Progress Notes (Signed)
Patient ID: [name], female    DOB: [birth year], 82 y.o.   MRN: [id number]  HPI F former smoker, followed for Chronic asthma/ COPD, chronic rhinosinusitis, complicated by hx AFib/ anticoagulation, CHF, cardiomyopathy, mitral insufficiency, GERD   07/16/15- 80 yoF former smoker, followed for Chronic asthma/ COPD, chronic rhinosinusitis, Lung nodules complicated by hx AFib/ warfarin, CHF, cardiomyopathy, mitral insufficiency, GERD, CKD Xolair FOLLOWS FOR: Pt continues to stay hoarse(for about 3 months now); continues Xolair and no reactions; pt states Nasal Spray and Breo have worked well for her.  Had a cold 3 months ago with lingering laryngitis/hoarseness, persistent postnasal drainage, frontal headache, white/clear mucus. Azelastine nasal spray helps. Chest feels controlled without significant wheeze. Likes Breo. Cardiology and nephrology have been adjusting medication for heart rate. Echocardiogram-atrial fibrillation, LVEF 30-35%, diffuse hypokinesis  01/14/2016-82 year old female former smoker followed for chronic asthma/COPD, chronic rhinosinusitis, lung nodules complicated by history A. fib/anticoagulation, CHF, cardiomyopathy, mitral insufficiency, GERD, CAD Xolair FOLLOWS FOR: Still on Xolair and doing well; Pt still has no voice-has been to ENT-was told he seen sticky stuff-treating as yeast infection(about 1 month)-was given 3 days of Diflucan and no better. FOLLOWS FOR:Pt is having nasal drainage-clear in color.Continues Xolair without any reactions.  I got a clearer story from her-Dr [facility] ENT told her he was concerned about the appearance of her larynx as possibility of cancer. He was setting her up for a biopsy. Her brother had laryngeal cancer/radiation/laryngectomy and she doesn't want to be going through that. She expresses concern about being put to sleep and has not agreed to proceed. She didn't volunteer this information until I asked her directly and she was trying  to focus the discussion on postnasal drainage. She denies throat pain and denies difficulty swallowing. Chest has been comfortable, not needing nebulizer treatment and months. CXR 07/16/2015- IMPRESSION: Hyperinflation consistent with known reactive airway disease/COPD. Superimposed acute bronchitic change manifested by slightly increased interstitial densities at the lung bases is suspected. There is no alveolar pneumonia nor CHF. Electronically Signed  By: David Martinique M.D.  On: 07/16/2015 13:30  Review of Systems-see HPI Constitutional:    weight loss, night sweats, fevers, chills, +fatigue, lassitude. HEENT:   No-  headaches, difficulty swallowing, tooth/dental problems, sore throat, + hoarseness      No-  sneezing, itching, ear ache, no- nasal congestion, +post nasal drip,  CV:  No- chest pain, no-orthopnea, PND, swelling in lower extremities, anasarca, dizziness, palpitations Resp: +   shortness of breath with exertion or at rest.              No-   productive cough,  non-productive cough,  No- coughing up of blood.              No-   change in color of mucus.  wheezing.   Skin: No-   rash or lesions. GI:  No-   heartburn, indigestion, abdominal pain, nausea, vomiting,  GU: . MS:  No-   joint pain or swelling.  Neuro-     nothing unusual Psych:  No- change in mood or affect. No depression or anxiety.  No memory loss.  Objective:   Physical Exam General- Alert, Oriented, Affect-appropriate/ cheerful, Distress- none acute, thin Skin- no rash visible now on right lateral ribs Lymphadenopathy- none Head- atraumatic            Eyes- Gross vision intact, PERRLA, +conjunctivae injected            Ears- Hearing, canals-normal  Nose- clear, turbinate edema, no-Septal dev, mucus, polyps, erosion, perforation             Throat- Mallampati III , mucosa clear/ not red , drainage- none, tonsils- atrophic,  dentures.+hoarseness .                     + Large torus hard  palate Neck- flexible , trachea midline, no stridor , thyroid nl, carotid no bruit Chest - symmetrical excursion , unlabored           Heart/CV- +IRR/ AFib  + faint ? Mitral regurg murmur , no gallop  , + HR 132                              no rub, nl s1 s2                           - JVD- none , edema- none, stasis changes- none, varices- none           Lung-  clear, unlabored, wheeze -none, Cough-none , dullness-none, rub- none           Chest wall-  Abd-  Br/ Gen/ Rectal- Not done, not indicated Extrem- cyanosis- none, clubbing, none, atrophy- none, strength- nl Neuro- slight resting tremor

## 2016-07-16 NOTE — Telephone Encounter (Signed)
Disregard message - caretaker called back and put pt back on the schedule -pr

## 2016-07-16 NOTE — Assessment & Plan Note (Signed)
She doesn't regard this as an active problem now. Sleeps alone with no reporter.

## 2016-07-16 NOTE — Telephone Encounter (Signed)
Pt scheduled for 07-16-16 @ 10:15a with CY. Nothing further needed.

## 2016-07-16 NOTE — Patient Instructions (Addendum)
Order- schedule CT neck with contrast     Dx chronic hoarseness, ? Laryngeal Ca- possible  Please call as needed

## 2016-07-16 NOTE — Assessment & Plan Note (Signed)
She describes excellent control and feels Xolair continues to be very helpful. This is not the time to make changes. Perhaps once her throat issue is resolved she can try off Xolair.

## 2016-07-16 NOTE — Assessment & Plan Note (Signed)
Atrial fibrillation with controlled ventricular response rate based on exam at this visit. Managed by cardiology

## 2016-07-17 ENCOUNTER — Telehealth: Payer: Self-pay | Admitting: Internal Medicine

## 2016-07-17 NOTE — Telephone Encounter (Signed)
Called and spoke with pt and she is aware of results of her moms labs.

## 2016-07-17 NOTE — Telephone Encounter (Signed)
Willow Ora returning call - she can be reached at [phone]

## 2016-07-17 NOTE — Telephone Encounter (Signed)
Notes Recorded by Deneise Lever, MD on 07/16/2016 at 2:17 PM EST Creatinine test of kidney function mildly abnormal with some progression since last result in this record from 5 years ago Radiology will decide if they proceed with contrast dye for CT scan or do the study without contrast. ---------------------------------------- lmtcb x1 for pt's niece, Angie.

## 2016-07-21 ENCOUNTER — Ambulatory Visit (INDEPENDENT_AMBULATORY_CARE_PROVIDER_SITE_OTHER): Payer: Medicare PPO | Admitting: *Deleted

## 2016-07-21 DIAGNOSIS — Z5181 Encounter for therapeutic drug level monitoring: Secondary | ICD-10-CM

## 2016-07-21 DIAGNOSIS — I4891 Unspecified atrial fibrillation: Secondary | ICD-10-CM

## 2016-07-21 LAB — POCT INR: INR: 5.7

## 2016-07-24 ENCOUNTER — Ambulatory Visit (HOSPITAL_COMMUNITY): Admission: RE | Admit: 2016-07-24 | Payer: Medicare PPO | Source: Ambulatory Visit

## 2016-07-24 ENCOUNTER — Telehealth: Payer: Self-pay | Admitting: Internal Medicine

## 2016-07-24 NOTE — Telephone Encounter (Signed)
Created in error

## 2016-07-24 NOTE — Telephone Encounter (Signed)
#   Vials:2 Arrival Date:11/161/17 Lot #:[id number] Exp Date:12/20

## 2016-07-28 ENCOUNTER — Ambulatory Visit (INDEPENDENT_AMBULATORY_CARE_PROVIDER_SITE_OTHER): Payer: Medicare PPO | Admitting: *Deleted

## 2016-07-28 DIAGNOSIS — I4891 Unspecified atrial fibrillation: Secondary | ICD-10-CM | POA: Diagnosis not present

## 2016-07-28 DIAGNOSIS — Z5181 Encounter for therapeutic drug level monitoring: Secondary | ICD-10-CM

## 2016-07-28 LAB — POCT INR: INR: 3.5

## 2016-07-29 ENCOUNTER — Ambulatory Visit (HOSPITAL_COMMUNITY)
Admission: RE | Admit: 2016-07-29 | Discharge: 2016-07-29 | Disposition: A | Discharge status: Home / Self Care | Payer: Medicare PPO | Source: Ambulatory Visit | Attending: Internal Medicine | Admitting: Internal Medicine

## 2016-07-29 ENCOUNTER — Encounter (HOSPITAL_COMMUNITY): Payer: Self-pay | Admitting: Radiology

## 2016-07-29 DIAGNOSIS — R490 Dysphonia: Secondary | ICD-10-CM

## 2016-07-29 DIAGNOSIS — J219 Acute bronchiolitis, unspecified: Secondary | ICD-10-CM | POA: Diagnosis not present

## 2016-07-29 DIAGNOSIS — I700 Atherosclerosis of aorta: Secondary | ICD-10-CM | POA: Diagnosis not present

## 2016-07-29 DIAGNOSIS — C329 Malignant neoplasm of larynx, unspecified: Secondary | ICD-10-CM | POA: Diagnosis not present

## 2016-07-29 MED ORDER — IOPAMIDOL (ISOVUE-300) INJECTION 61%
75.0000 mL | Freq: Once | INTRAVENOUS | Status: AC | PRN
Start: 2016-07-29 — End: 2016-07-29
  Administered 2016-07-29: 60 mL via INTRAVENOUS

## 2016-07-30 ENCOUNTER — Other Ambulatory Visit: Payer: Self-pay | Admitting: Pulmonary Disease

## 2016-07-30 DIAGNOSIS — J3800 Paralysis of vocal cords and larynx, unspecified: Secondary | ICD-10-CM

## 2016-08-01 ENCOUNTER — Ambulatory Visit: Payer: Medicare PPO

## 2016-08-04 ENCOUNTER — Ambulatory Visit (INDEPENDENT_AMBULATORY_CARE_PROVIDER_SITE_OTHER): Payer: Medicare PPO

## 2016-08-04 ENCOUNTER — Other Ambulatory Visit: Payer: Self-pay | Admitting: Internal Medicine

## 2016-08-04 DIAGNOSIS — J4520 Mild intermittent asthma, uncomplicated: Secondary | ICD-10-CM | POA: Diagnosis not present

## 2016-08-04 MED ORDER — OMALIZUMAB 150 MG ~~LOC~~ SOLR
300.0000 mg | SUBCUTANEOUS | Status: DC
  Administered 2016-08-04: 300 mg via SUBCUTANEOUS

## 2016-08-11 ENCOUNTER — Ambulatory Visit (INDEPENDENT_AMBULATORY_CARE_PROVIDER_SITE_OTHER): Payer: Medicare PPO | Admitting: *Deleted

## 2016-08-11 DIAGNOSIS — I4891 Unspecified atrial fibrillation: Secondary | ICD-10-CM | POA: Diagnosis not present

## 2016-08-11 DIAGNOSIS — Z5181 Encounter for therapeutic drug level monitoring: Secondary | ICD-10-CM | POA: Diagnosis not present

## 2016-08-11 LAB — POCT INR: INR: 2

## 2016-08-18 ENCOUNTER — Telehealth: Payer: Self-pay | Admitting: Internal Medicine

## 2016-08-18 NOTE — Telephone Encounter (Signed)
I returned [employer]'s call and ordered pt. Xolair. I'll create another note so this can be closed.

## 2016-08-18 NOTE — Telephone Encounter (Signed)
#   vials:2 Ordered date:08/18/16 Shipping Date:08/26/16

## 2016-08-27 ENCOUNTER — Other Ambulatory Visit: Payer: Self-pay | Admitting: Internal Medicine

## 2016-08-27 NOTE — Telephone Encounter (Signed)
#   Vials:2 Arrival Date:08/27/16 Lot #:[id number] Exp Date:3/21

## 2016-08-28 ENCOUNTER — Telehealth: Payer: Self-pay | Admitting: Internal Medicine

## 2016-08-28 NOTE — Telephone Encounter (Signed)
Spoke with Angie and informed her that the rx was sent this morning she states she will follow up with them because they stated when she called them this morning they had not received it yet. She will follow up with [place] if there is any issues. Nothing further is needed at this time.

## 2016-08-30 ENCOUNTER — Other Ambulatory Visit: Payer: Self-pay | Admitting: Internal Medicine

## 2016-09-02 NOTE — Telephone Encounter (Signed)
Patient was last seen on 01/14/2016. Her last RX for diazepam 5mg  was on 05/15/2016. Is it ok for her to receive another RX? Thanks!

## 2016-09-03 ENCOUNTER — Ambulatory Visit (INDEPENDENT_AMBULATORY_CARE_PROVIDER_SITE_OTHER): Payer: Medicare PPO | Admitting: *Deleted

## 2016-09-03 ENCOUNTER — Ambulatory Visit: Payer: Medicare PPO

## 2016-09-03 DIAGNOSIS — I4891 Unspecified atrial fibrillation: Secondary | ICD-10-CM

## 2016-09-03 DIAGNOSIS — Z5181 Encounter for therapeutic drug level monitoring: Secondary | ICD-10-CM

## 2016-09-03 LAB — POCT INR: INR: 2.3

## 2016-09-03 NOTE — Telephone Encounter (Signed)
Ok to reill x 6 months

## 2016-09-05 NOTE — Telephone Encounter (Signed)
Spoke with [facility] and [facility]. Nothing further needed.

## 2016-09-16 ENCOUNTER — Other Ambulatory Visit: Payer: Self-pay | Admitting: Internal Medicine

## 2016-09-23 ENCOUNTER — Telehealth: Payer: Self-pay | Admitting: Internal Medicine

## 2016-09-26 NOTE — Telephone Encounter (Signed)
Spoke with [facility], aware that pt no-show'ed for last injection and still has a dose on file.  I advised that we would call for delivery after pt has received current dose on hand.    lmtcb X1 for pt to schedule injection.  Will keep in [place] box for followup.

## 2016-09-26 NOTE — Telephone Encounter (Signed)
Will route to Tammy scott to f/u on. Thanks

## 2016-09-26 NOTE — Telephone Encounter (Signed)
Thanks for letting me know. Nothing further needed at this time. closing note.I will call pharm. Back once she's had the shot that we have on hand.

## 2016-10-01 ENCOUNTER — Ambulatory Visit (INDEPENDENT_AMBULATORY_CARE_PROVIDER_SITE_OTHER): Payer: Medicare PPO | Admitting: *Deleted

## 2016-10-01 DIAGNOSIS — N183 Chronic kidney disease, stage 3 (moderate): Secondary | ICD-10-CM | POA: Diagnosis not present

## 2016-10-01 DIAGNOSIS — E876 Hypokalemia: Secondary | ICD-10-CM | POA: Diagnosis not present

## 2016-10-01 DIAGNOSIS — Z5181 Encounter for therapeutic drug level monitoring: Secondary | ICD-10-CM | POA: Diagnosis not present

## 2016-10-01 DIAGNOSIS — I129 Hypertensive chronic kidney disease with stage 1 through stage 4 chronic kidney disease, or unspecified chronic kidney disease: Secondary | ICD-10-CM | POA: Diagnosis not present

## 2016-10-01 DIAGNOSIS — I4891 Unspecified atrial fibrillation: Secondary | ICD-10-CM

## 2016-10-01 DIAGNOSIS — D631 Anemia in chronic kidney disease: Secondary | ICD-10-CM | POA: Diagnosis not present

## 2016-10-01 DIAGNOSIS — N2581 Secondary hyperparathyroidism of renal origin: Secondary | ICD-10-CM | POA: Diagnosis not present

## 2016-10-01 LAB — POCT INR: INR: 2.6

## 2016-10-28 ENCOUNTER — Other Ambulatory Visit: Payer: Self-pay | Admitting: *Deleted

## 2016-10-28 MED ORDER — WARFARIN SODIUM 5 MG PO TABS: ORAL_TABLET | ORAL | 1 refills | Status: DC

## 2016-10-28 NOTE — Telephone Encounter (Signed)
Pt called.  Has not received warfarin from [facility] mail order yet.  Needs 30 day supply [facility] [place].  Done.

## 2016-10-29 ENCOUNTER — Ambulatory Visit (INDEPENDENT_AMBULATORY_CARE_PROVIDER_SITE_OTHER): Payer: Medicare PPO | Admitting: *Deleted

## 2016-10-29 DIAGNOSIS — Z5181 Encounter for therapeutic drug level monitoring: Secondary | ICD-10-CM

## 2016-10-29 DIAGNOSIS — I4891 Unspecified atrial fibrillation: Secondary | ICD-10-CM

## 2016-10-29 LAB — POCT INR: INR: 4

## 2016-11-01 ENCOUNTER — Other Ambulatory Visit: Payer: Self-pay | Admitting: Cardiology

## 2016-11-10 ENCOUNTER — Ambulatory Visit (INDEPENDENT_AMBULATORY_CARE_PROVIDER_SITE_OTHER): Payer: Medicare PPO | Admitting: *Deleted

## 2016-11-10 DIAGNOSIS — Z5181 Encounter for therapeutic drug level monitoring: Secondary | ICD-10-CM

## 2016-11-10 DIAGNOSIS — I4891 Unspecified atrial fibrillation: Secondary | ICD-10-CM | POA: Diagnosis not present

## 2016-11-10 LAB — POCT INR: INR: 3.5

## 2016-11-17 ENCOUNTER — Ambulatory Visit: Payer: Medicare PPO | Admitting: Internal Medicine

## 2016-11-19 NOTE — Progress Notes (Deleted)
Cardiology Office Note  Date: 11/19/2016   ID: [name], DOB [birth year], MRN [id number]  PCP: Purvis Kilts, MD  Primary Cardiologist: [name], MD   No chief complaint on file.   History of Present Illness: [name] is an 83 y.o. female last seen in July 2017.  Past Medical History:  Diagnosis Date  . Asthma   . Atrial fibrillation ([place])   . Chronic rhinitis   . COPD (chronic obstructive pulmonary disease) ([place])   . GERD (gastroesophageal reflux disease)   . Mitral regurgitation   . Mitral valve prolapse   . Nasal polyps   . Nonischemic cardiomyopathy ([facility])    LVEF 25-30% improved to 45-50%  . Obstructive sleep apnea   . Transudative pleural effusion     Past Surgical History:  Procedure Laterality Date  . APPENDECTOMY  2000  . Bilateral cataract surgery    . LAPAROSCOPIC NISSEN FUNDOPLICATION  4656  . NOSE SURGERY      Current Outpatient Prescriptions  Medication Sig Dispense Refill  . albuterol (PROVENTIL HFA;VENTOLIN HFA) 108 (90 BASE) MCG/ACT inhaler Inhale 2 puffs into the lungs every 6 (six) hours as needed for wheezing or shortness of breath. 1 Inhaler 11  . albuterol (PROVENTIL) (2.5 MG/3ML) 0.083% nebulizer solution INHALE THE CONTENTS OF 1 VIAL USING NEBULIZER EVERY 4 HOURS AS NEEDED FOR WHEEZING OR SHORTNESS OF BREATH 90 mL 12  . atorvastatin (LIPITOR) 10 MG tablet Take 10 mg by mouth daily.      Marland Kitchen azelastine (ASTELIN) 0.1 % nasal spray Place 1-2 sprays into both nostrils 2 (two) times daily. Use in each nostril as directed 30 mL prn  . BREO ELLIPTA 200-25 MCG/INH AEPB INHALE 1 PUFF INTO LUNGS DAILY, RINSE MOUTH AFTER 60 each 7  . Calcium Carbonate-Vitamin D (CALTRATE 600+D) 600-400 MG-UNIT per tablet Take 1 tablet by mouth daily.      . cetirizine (ZYRTEC) 10 MG tablet Take 10 mg by mouth daily.    . citalopram (CELEXA) 40 MG tablet Take 1 tablet by mouth daily.    . diazepam (VALIUM) 5 MG tablet TAKE 1 TABLET BY MOUTH EVERY  12 HOURS AS NEEDED 30 tablet 5  . digoxin (LANOXIN) 0.125 MG tablet TAKE 1 TABLET EVERY DAY 90 tablet 3  . diltiazem (CARDIZEM CD) 240 MG 24 hr capsule Take 1 capsule (240 mg total) by mouth daily. 90 capsule 3  . diltiazem (CARDIZEM CD) 240 MG 24 hr capsule TAKE 1 CAPSULE (240 MG TOTAL) BY MOUTH DAILY. 90 capsule 3  . esomeprazole (NEXIUM) 40 MG capsule Take 40 mg by mouth 2 (two) times daily.      . furosemide (LASIX) 40 MG tablet TAKE 1 TABLET EVERY DAY 90 tablet 3  . potassium chloride SA (KLOR-CON M20) 20 MEQ tablet Take 1 tablet (20 mEq total) by mouth daily. 30 tablet 6  . PROAIR HFA 108 (90 Base) MCG/ACT inhaler INHALE 1 TO 2 PUFFS EVERY 4 TO 6 HOURS AS NEEDED FOR WHEEZING 8.5 g 5  . traMADol (ULTRAM) 50 MG tablet TAKE 1 TABLET EVERY 4 HOURS AS NEEDED 90 tablet 5  . warfarin (COUMADIN) 5 MG tablet Take 1 tablet daily except 1 1/2 tablets on Mondays 30 tablet 1   Current Facility-Administered Medications  Medication Dose Route Frequency Provider Last Rate Last Dose  . omalizumab Arvid Right) injection 300 mg  300 mg Subcutaneous Q14 Days Deneise Lever, MD   300 mg at 08/04/16 1221  Allergies:  Amoxicillin-pot clavulanate; Clarithromycin; Clindamycin; Doxycycline; and Penicillins   Social History: The patient  reports that she quit smoking about 36 years ago. Her smoking use included Cigarettes. She has a 10.00 pack-year smoking history. She has never used smokeless tobacco. She reports that she does not drink alcohol or use drugs.   Family History: The patient's family history includes Atrial fibrillation in her sister; Cancer in her brother, brother, father, and mother.   ROS:  Please see the history of present illness. Otherwise, complete review of systems is positive for {NONE DEFAULTED:18576::"none"}.  All other systems are reviewed and negative.   Physical Exam: VS:  There were no vitals taken for this visit., BMI There is no height or weight on file to calculate BMI.  Wt  Readings from Last 3 Encounters:  07/16/16 165 lb 6.4 oz (75 kg)  03/25/16 169 lb (76.7 kg)  01/14/16 170 lb 12.8 oz (77.5 kg)    Elderly woman, appears comfortable at rest.  HEENT: Lids normal, conjunctiva normal, oropharynx clear.  Neck: Supple, no elevated JVP or carotid bruits, no thyromegaly.  Lungs: Scattered rhonchi with decreased breath sounds, no labored breathing.  Cardiac: Irregularly irregular, rapid, soft apical systolic murmur, no pericardial rub.  Abdomen: Soft, nontender, bowel sound present.  Skin: Warm and dry.  Extremities: No pitting edema below the knees.  ECG: I personally reviewed the tracing from 03/25/2016 which showed rate-controlled atrial fibrillation with old inferior apical infarct pattern and nonspecific T-wave changes.  Recent Labwork: 07/16/2016: BUN 28; Creatinine, Ser 1.44; Potassium 4.2; Sodium 137   Other Studies Reviewed Today:  Echocardiogram 11/12/2015: Study Conclusions  - Left ventricle: The cavity size was normal. Wall thickness was   increased in a pattern of mild LVH. Systolic function was normal.   The estimated ejection fraction was in the range of 55% to 60%.   Wall motion was normal; there were no regional wall motion   abnormalities. - Aortic valve: Mildly calcified annulus. Trileaflet; mildly   thickened leaflets. Valve area (VTI): 1.89 cm^2. Valve area   (Vmax): 1.89 cm^2. - Mitral valve: Moderately calcified annulus. Mildly thickened   leaflets . The findings are consistent with moderate stenosis.   There was mild to moderate regurgitation. Mean gradient (D): 7 mm   Hg. Peak gradient (D): 19 mm Hg. In setting of afib unable to   accurately calculate MV area by PHT or continuity equation. By   gradient there is moderate mitral stenosis. - Left atrium: The atrium was severely dilated. - Right atrium: The atrium was mildly dilated.  Assessment and Plan:   Current medicines were reviewed with the patient today.  No  orders of the defined types were placed in this encounter.   Disposition:  Signed, Satira Sark, MD, [facility] 11/19/2016 11:46 AM    [place] [facility] [place]. [address], [place], [place] [address] Phone: [phone]; Fax: [phone]

## 2016-11-20 ENCOUNTER — Ambulatory Visit: Payer: Medicare PPO | Admitting: Cardiology

## 2016-11-29 ENCOUNTER — Other Ambulatory Visit: Payer: Self-pay | Admitting: Internal Medicine

## 2016-12-01 NOTE — Telephone Encounter (Signed)
Ok to refill 

## 2016-12-01 NOTE — Telephone Encounter (Signed)
CY Please advise on refill request. Thanks.  

## 2016-12-02 ENCOUNTER — Telehealth: Payer: Self-pay | Admitting: Internal Medicine

## 2016-12-02 ENCOUNTER — Other Ambulatory Visit: Payer: Self-pay | Admitting: Internal Medicine

## 2016-12-02 NOTE — Telephone Encounter (Signed)
Spoke with pt's daughter, requesting a refill on Tramadol.  [facility] [place]  Per chart this was refilled at 10:42 this morning.  Pt's daughter aware.   Will check back with pharmacy and let us know if anything further is needed.  Will close encounter.

## 2016-12-19 ENCOUNTER — Other Ambulatory Visit: Payer: Self-pay | Admitting: Cardiology

## 2016-12-19 NOTE — Telephone Encounter (Signed)
Called spoke with pt advised her she is overdue for f/u.  Pt cancelled appt on 12/01/16 and has not rescheduled.  Pt states her car has been in the shop and she has not had transportation. Pt states she should be getting car back today or Monday, pt has appt with Dr Domenic Polite on Thursday 12/25/16.  Made appt to have INR checked at that OV, results to [facility]. Refill sent to pharmacy for 1 month supply, no refills until INR checked in clinic.

## 2016-12-24 NOTE — Progress Notes (Signed)
Cardiology Office Note  Date: 12/25/2016   ID: [name], DOB [birth year], MRN [id number]  PCP: Purvis Kilts, MD  Primary Cardiologist: [name], MD   Chief Complaint  Patient presents with  . Atrial Fibrillation     History of Present Illness: [name] is an 83 y.o. female last seen in July 2017. She presents for a routine follow-up visit. Reports recent sinus trouble during the pollen season. Otherwise no major change in health. She does not endorse any palpitations or chest pain. Has had more leg swelling. She continues on Lasix at 40 mg daily.   She continues on Coumadin with follow-up in the anticoagulation clinic. She is due for follow-up PT/INR. No reported bleeding episodes.  Echocardiogram from last year is reviewed below. LVEF had normalized that point. She does have valvular heart disease which we are managing conservatively.  Past Medical History:  Diagnosis Date  . Asthma   . Atrial fibrillation ([place])   . Chronic rhinitis   . COPD (chronic obstructive pulmonary disease) ([place])   . GERD (gastroesophageal reflux disease)   . Mitral regurgitation   . Mitral valve prolapse   . Nasal polyps   . Nonischemic cardiomyopathy ([facility])    LVEF 25-30% improved to 45-50%  . Obstructive sleep apnea   . Transudative pleural effusion     Past Surgical History:  Procedure Laterality Date  . APPENDECTOMY  2000  . Bilateral cataract surgery    . LAPAROSCOPIC NISSEN FUNDOPLICATION  0932  . NOSE SURGERY      Current Outpatient Prescriptions  Medication Sig Dispense Refill  . albuterol (PROVENTIL HFA;VENTOLIN HFA) 108 (90 BASE) MCG/ACT inhaler Inhale 2 puffs into the lungs every 6 (six) hours as needed for wheezing or shortness of breath. 1 Inhaler 11  . albuterol (PROVENTIL) (2.5 MG/3ML) 0.083% nebulizer solution INHALE THE CONTENTS OF 1 VIAL USING NEBULIZER EVERY 4 HOURS AS NEEDED FOR WHEEZING OR SHORTNESS OF BREATH 90 mL 12  . atorvastatin (LIPITOR)  10 MG tablet Take 10 mg by mouth daily.      Marland Kitchen azelastine (ASTELIN) 0.1 % nasal spray Place 1-2 sprays into both nostrils 2 (two) times daily. Use in each nostril as directed 30 mL prn  . Calcium Carbonate-Vitamin D (CALTRATE 600+D) 600-400 MG-UNIT per tablet Take 1 tablet by mouth daily.      . cetirizine (ZYRTEC) 10 MG tablet Take 10 mg by mouth daily.    . citalopram (CELEXA) 40 MG tablet Take 1 tablet by mouth daily.    . diazepam (VALIUM) 5 MG tablet TAKE 1 TABLET BY MOUTH EVERY 12 HOURS AS NEEDED 30 tablet 5  . digoxin (LANOXIN) 0.125 MG tablet TAKE 1 TABLET EVERY DAY 90 tablet 3  . diltiazem (CARDIZEM CD) 240 MG 24 hr capsule Take 1 capsule (240 mg total) by mouth daily. 90 capsule 3  . esomeprazole (NEXIUM) 40 MG capsule Take 40 mg by mouth 2 (two) times daily.      . furosemide (LASIX) 40 MG tablet TAKE 1 TABLET EVERY DAY 90 tablet 3  . potassium chloride SA (KLOR-CON M20) 20 MEQ tablet Take 1 tablet (20 mEq total) by mouth daily. 30 tablet 6  . PROAIR HFA 108 (90 Base) MCG/ACT inhaler INHALE 1 TO 2 PUFFS EVERY 4 TO 6 HOURS AS NEEDED FOR WHEEZING 8.5 g 5  . traMADol (ULTRAM) 50 MG tablet TAKE 1 TABLET BY MOUTH EVERY 4 HOURS AS NEEDED 90 tablet 0  . warfarin (COUMADIN)  5 MG tablet TAKE 1 TABLET BY MOUTH DAILY,EXCEPT TAKE 1 AND A HALF TABLETS ON MONDAYS 30 tablet 0   Current Facility-Administered Medications  Medication Dose Route Frequency Provider Last Rate Last Dose  . omalizumab Arvid Right) injection 300 mg  300 mg Subcutaneous Q14 Days Deneise Lever, MD   300 mg at 08/04/16 1221   Allergies:  Amoxicillin-pot clavulanate; Clarithromycin; Clindamycin; Doxycycline; and Penicillins   Social History: The patient  reports that she quit smoking about 36 years ago. Her smoking use included Cigarettes. She has a 10.00 pack-year smoking history. She has never used smokeless tobacco. She reports that she does not drink alcohol or use drugs.   ROS:  Please see the history of present illness.  Otherwise, complete review of systems is positive for sinus congestion and drainage.  All other systems are reviewed and negative.   Physical Exam: VS:  BP 112/74   Pulse (!) 52   Ht 5\' 7"  (1.702 m)   Wt 165 lb (74.8 kg)   SpO2 97%   BMI 25.84 kg/m , BMI Body mass index is 25.84 kg/m.  Wt Readings from Last 3 Encounters:  12/25/16 165 lb (74.8 kg)  07/16/16 165 lb 6.4 oz (75 kg)  03/25/16 169 lb (76.7 kg)    Elderly woman, appears comfortable at rest.  HEENT: Lids normal, conjunctiva normal, oropharynx clear.  Neck: Supple, no elevated JVP or carotid bruits, no thyromegaly.  Lungs: Scattered rhonchi with decreased breath sounds, no labored breathing.  Cardiac: Irregularly irregular, rapid, soft apical systolic murmur, no pericardial rub.  Abdomen: Soft, nontender, bowel sound present.  Skin: Warm and dry.  Extremities: Mild lower leg edema.  Neuropsychiatric: Alert and oriented 3, affect appropriate.  ECG: I personally reviewed the tracing from 03/25/2016 which showed rate-controlled atrial fibrillation with relatively low voltage and rightward axis, nonspecific ST-T changes.  Recent Labwork: 07/16/2016: BUN 28; Creatinine, Ser 1.44; Potassium 4.2; Sodium 137   Other Studies Reviewed Today:  Echocardiogram 11/12/2015: Study Conclusions  - Left ventricle: The cavity size was normal. Wall thickness was   increased in a pattern of mild LVH. Systolic function was normal.   The estimated ejection fraction was in the range of 55% to 60%.   Wall motion was normal; there were no regional wall motion   abnormalities. - Aortic valve: Mildly calcified annulus. Trileaflet; mildly   thickened leaflets. Valve area (VTI): 1.89 cm^2. Valve area   (Vmax): 1.89 cm^2. - Mitral valve: Moderately calcified annulus. Mildly thickened   leaflets . The findings are consistent with moderate stenosis.   There was mild to moderate regurgitation. Mean gradient (D): 7 mm   Hg. Peak gradient  (D): 19 mm Hg. In setting of afib unable to   accurately calculate MV area by PHT or continuity equation. By   gradient there is moderate mitral stenosis. - Left atrium: The atrium was severely dilated. - Right atrium: The atrium was mildly dilated.  Assessment and Plan:  1. Chronic atrial fibrillation. Continue with current medications including Cardizem CD and Lanoxin for heart rate control, Coumadin for stroke prophylaxis. She is due for a PT/INR.  2. History of tachycardia-mediated cardiomyopathy with normalization of LVEF by follow-up echocardiogram last year.  3. Bilateral leg edema. She is on Lasix 40 mg daily, can increase dose by one half to one pill for a few days to get fluid under better control and then resume prior dose.  4. Mitral regurgitation and mitral stenosis as outlined above. Continuing to  follow conservatively.  Current medicines were reviewed with the patient today.  Disposition: Follow-up in 6 months.  Signed, Satira Sark, MD, [facility] 12/25/2016 11:23 AM    [place] at [facility] [address], [place], [place] [address] Phone: [phone]; Fax: [phone]

## 2016-12-25 ENCOUNTER — Encounter (INDEPENDENT_AMBULATORY_CARE_PROVIDER_SITE_OTHER): Payer: Medicare PPO

## 2016-12-25 ENCOUNTER — Ambulatory Visit (INDEPENDENT_AMBULATORY_CARE_PROVIDER_SITE_OTHER): Payer: Medicare PPO | Admitting: Cardiology

## 2016-12-25 ENCOUNTER — Encounter: Payer: Self-pay | Admitting: Cardiology

## 2016-12-25 ENCOUNTER — Ambulatory Visit (INDEPENDENT_AMBULATORY_CARE_PROVIDER_SITE_OTHER): Payer: Medicare PPO | Admitting: Interventional Cardiology

## 2016-12-25 VITALS — BP 112/74 | HR 52 | Ht 67.0 in | Wt 165.0 lb

## 2016-12-25 DIAGNOSIS — Z8679 Personal history of other diseases of the circulatory system: Secondary | ICD-10-CM

## 2016-12-25 DIAGNOSIS — R600 Localized edema: Secondary | ICD-10-CM | POA: Diagnosis not present

## 2016-12-25 DIAGNOSIS — Z5181 Encounter for therapeutic drug level monitoring: Secondary | ICD-10-CM

## 2016-12-25 DIAGNOSIS — I482 Chronic atrial fibrillation, unspecified: Secondary | ICD-10-CM

## 2016-12-25 DIAGNOSIS — I059 Rheumatic mitral valve disease, unspecified: Secondary | ICD-10-CM | POA: Diagnosis not present

## 2016-12-25 DIAGNOSIS — I4891 Unspecified atrial fibrillation: Secondary | ICD-10-CM

## 2016-12-25 LAB — POCT INR: INR: 3.2

## 2016-12-25 NOTE — Patient Instructions (Addendum)
Your physician wants you to follow-up in: 6 months with Dr Ferne Reus will receive a reminder letter in the mail two months in advance. If you don't receive a letter, please call our office to schedule the follow-up appointment.    INR today:3.2   Will send to coumadin clinic in [place] recommends that you continue on your current medications as directed. Please refer to the Current Medication list given to you today.You may take extra lasix occasionally for feet swelling.    If you need a refill on your cardiac medications before your next appointment, please call your pharmacy.      Thank you for choosing [facility] !

## 2017-01-05 ENCOUNTER — Ambulatory Visit: Payer: Medicare PPO | Admitting: Internal Medicine

## 2017-01-07 ENCOUNTER — Ambulatory Visit (INDEPENDENT_AMBULATORY_CARE_PROVIDER_SITE_OTHER): Payer: Medicare PPO | Admitting: *Deleted

## 2017-01-07 DIAGNOSIS — Z5181 Encounter for therapeutic drug level monitoring: Secondary | ICD-10-CM | POA: Diagnosis not present

## 2017-01-07 DIAGNOSIS — I4891 Unspecified atrial fibrillation: Secondary | ICD-10-CM

## 2017-01-07 LAB — POCT INR: INR: 3.2

## 2017-01-20 DIAGNOSIS — J343 Hypertrophy of nasal turbinates: Secondary | ICD-10-CM | POA: Diagnosis not present

## 2017-01-20 DIAGNOSIS — J382 Nodules of vocal cords: Secondary | ICD-10-CM | POA: Diagnosis not present

## 2017-01-20 DIAGNOSIS — J209 Acute bronchitis, unspecified: Secondary | ICD-10-CM | POA: Diagnosis not present

## 2017-01-20 DIAGNOSIS — I429 Cardiomyopathy, unspecified: Secondary | ICD-10-CM | POA: Diagnosis not present

## 2017-01-20 DIAGNOSIS — R070 Pain in throat: Secondary | ICD-10-CM | POA: Diagnosis not present

## 2017-01-20 DIAGNOSIS — Z6826 Body mass index (BMI) 26.0-26.9, adult: Secondary | ICD-10-CM | POA: Diagnosis not present

## 2017-01-20 DIAGNOSIS — J069 Acute upper respiratory infection, unspecified: Secondary | ICD-10-CM | POA: Diagnosis not present

## 2017-01-20 DIAGNOSIS — Z1389 Encounter for screening for other disorder: Secondary | ICD-10-CM | POA: Diagnosis not present

## 2017-01-20 DIAGNOSIS — E119 Type 2 diabetes mellitus without complications: Secondary | ICD-10-CM | POA: Diagnosis not present

## 2017-01-21 ENCOUNTER — Telehealth: Payer: Self-pay | Admitting: *Deleted

## 2017-01-21 NOTE — Telephone Encounter (Signed)
Pt called.  States she has bronchitis.  Was started pn prednisone taper today.  She has 4mg  tablet and she took 6 tablets today, 5/17 5 tablets 5/18  4 tablets 5/19  3 tablets 5/20  2 tablets 5/21 1 tablet Tod pt to decrease coumadin to 5mg  daily except 2.5mg  on Tonight, Friday and Monday.  She has an INR appt for 5/23.  Pt verbalized understanding

## 2017-01-22 ENCOUNTER — Other Ambulatory Visit: Payer: Self-pay | Admitting: Cardiology

## 2017-01-22 ENCOUNTER — Ambulatory Visit (INDEPENDENT_AMBULATORY_CARE_PROVIDER_SITE_OTHER): Payer: Medicare PPO | Admitting: Internal Medicine

## 2017-01-22 VITALS — BP 110/72 | HR 95 | Ht 66.5 in | Wt 163.1 lb

## 2017-01-22 DIAGNOSIS — I482 Chronic atrial fibrillation, unspecified: Secondary | ICD-10-CM

## 2017-01-22 DIAGNOSIS — J449 Chronic obstructive pulmonary disease, unspecified: Secondary | ICD-10-CM

## 2017-01-22 DIAGNOSIS — J4489 Other specified chronic obstructive pulmonary disease: Secondary | ICD-10-CM

## 2017-01-22 MED ORDER — UMECLIDINIUM-VILANTEROL 62.5-25 MCG/INH IN AEPB: 1.0000 | INHALATION_SPRAY | Freq: Every day | RESPIRATORY_TRACT | 0 refills | Status: DC

## 2017-01-22 MED ORDER — LEVALBUTEROL HCL 0.63 MG/3ML IN NEBU
0.6300 mg | INHALATION_SOLUTION | Freq: Once | RESPIRATORY_TRACT | Status: AC
  Administered 2017-01-22: 0.63 mg via RESPIRATORY_TRACT

## 2017-01-22 NOTE — Patient Instructions (Addendum)
Order- DME [facility]- replacement mouthpiece and tubing set for her nebulizer machine please                 Dx Asthma with COPD  Order- neb xop 0.63    Dx exacerbation Asthma with COPD  Sample Anoro Ellipta     Inhale 1 puff, once daily   See if this speeds clearing of your chest cold  Ok to continue using the rescue inhaler when needed

## 2017-01-22 NOTE — Progress Notes (Signed)
Patient ID: [name], female    DOB: 1933-12-16, 83 y.o.   MRN: [id number]  HPI F former smoker, followed for Chronic asthma/ COPD/ Xolair, chronic rhinosinusitis, complicated by hx AFib/ anticoagulation, CHF, cardiomyopathy, mitral insufficiency, GERD, CAD Echocardiogram-atrial fibrillation, LVEF 30-35%, diffuse hypokinesis  ---------------------------------------------------------------------------------------------------------  01/14/2016-83 year old female former smoker followed for chronic asthma/COPD, chronic rhinosinusitis, lung nodules complicated by history A. fib/anticoagulation, CHF, cardiomyopathy, mitral insufficiency, GERD, CAD Xolair FOLLOWS FOR: Still on Xolair and doing well; Pt still has no voice-has been to ENT-was told he seen sticky stuff-treating as yeast infection(about 1 month)-was given 3 days of Diflucan and no better. FOLLOWS FOR:Pt is having nasal drainage-clear in color.Continues Xolair without any reactions.  I got a clearer story from her-Dr [facility] ENT told her he was concerned about the appearance of her larynx as possibility of cancer. He was setting her up for a biopsy. Her brother had laryngeal cancer/radiation/laryngectomy and she doesn't want to be going through that. She expresses concern about being put to sleep and has not agreed to proceed. She didn't volunteer this information until I asked her directly and she was trying to focus the discussion on postnasal drainage. She denies throat pain and denies difficulty swallowing. Chest has been comfortable, not needing nebulizer treatment and months. CXR 07/16/2015- IMPRESSION: Hyperinflation consistent with known reactive airway disease/COPD. Superimposed acute bronchitic change manifested by slightly increased interstitial densities at the lung bases is suspected. There is no alveolar pneumonia nor CHF. Electronically Signed  By: David Martinique M.D.  On: 07/16/2015 13:30  01/23/17-83 year old  female former smoker followed for chronic asthma/COPD, chronic rhinosinusitis, lung nodules complicated by history A. fib/anticoagulation, CHF, cardiomyopathy, mitral insufficiency, GERD, CAD Xolair started out with head congestion and she stated since she started on the prednisone on tuesday, it is all the congestion in her chest.  cough with white sputum. denies any fever, chills, sweats, body aches  Missed Xolair injection this time due to acute bronchitis. Improving gradually. She says her voice quality was getting better before she caught this cold. She never followed up with ENT. CT soft tissues neck 07/30/16- IMPRESSION: 1. Apposition of vocal cords and effacement of the laryngeal ventricles without a discrete exophytic mucosal lesion identified. This may be due to phonation, polyposis, scarring, or posttreatment changes. Given history of laryngeal cancer, direct visualization is recommended. No cervical lymphadenopathy. 2. Aortic atherosclerosis. 3. Mild infectious/inflammatory bronchiolitis.  Review of Systems-see HPI   + = pos Constitutional:    weight loss, night sweats, fevers, chills, +fatigue, lassitude. HEENT:   No-  headaches, difficulty swallowing, tooth/dental problems, sore throat, + hoarseness      No-  sneezing, itching, ear ache, no- nasal congestion, +post nasal drip,  CV:  No- chest pain, no-orthopnea, PND, swelling in lower extremities, anasarca, dizziness, palpitations Resp: +   shortness of breath with exertion or at rest.              No-   productive cough,  non-productive cough,  No- coughing up of blood.              No-   change in color of mucus.  wheezing.   Skin: No-   rash or lesions. GI:  No-   heartburn, indigestion, abdominal pain, nausea, vomiting,  GU: . MS:  No-   joint pain or swelling.  Neuro-     nothing unusual Psych:  No- change in mood or affect. No depression or anxiety.  No memory loss.  Objective:   Physical Exam General- Alert,  Oriented, Affect-appropriate/ cheerful, Distress- none acute, thin Skin- no rash visible now on right lateral ribs Lymphadenopathy- none Head- atraumatic            Eyes- Gross vision intact, PERRLA, +conjunctivae injected            Ears- Hearing, canals-normal            Nose- clear, turbinate edema, no-Septal dev, mucus, polyps, erosion, perforation             Throat- Mallampati III , mucosa clear/ not red , drainage- none, tonsils- atrophic,  Dentures. +hoarseness not worse .                     + Large torus hard palate Neck- flexible , trachea midline, no stridor , thyroid nl, carotid no bruit Chest - symmetrical excursion , unlabored           Heart/CV- +IRR/ AFib  + faint ? Mitral regurg murmur , no gallop  , + HR 132                              no rub, nl s1 s2                           - JVD- none , edema- none, stasis changes- none, varices- none           Lung-  , unlabored, wheeze+, Cough-none , dullness-none, rub- none           Chest wall-  Abd-  Br/ Gen/ Rectal- Not done, not indicated Extrem- cyanosis- none, clubbing, none, atrophy- none, strength- nl Neuro- slight resting tremor

## 2017-01-25 ENCOUNTER — Encounter: Payer: Self-pay | Admitting: Internal Medicine

## 2017-01-25 NOTE — Assessment & Plan Note (Signed)
Controlled ventricular response rate read followed by cardiology. Denies problems from medical regulation.

## 2017-01-25 NOTE — Assessment & Plan Note (Signed)
She continues to feel Xolair helps her. Recent acute exacerbation compatible with a viral respiratory illness. Plan-she is wheezing today sore giving her a Xopenex neb treatment while she is here and a sample of Anoro to help through the exacerbation. Her DME will provide replacement tubing and mouthpiece for her nebulizer

## 2017-01-28 ENCOUNTER — Ambulatory Visit (INDEPENDENT_AMBULATORY_CARE_PROVIDER_SITE_OTHER): Payer: Medicare PPO | Admitting: *Deleted

## 2017-01-28 DIAGNOSIS — I4891 Unspecified atrial fibrillation: Secondary | ICD-10-CM

## 2017-01-28 DIAGNOSIS — Z5181 Encounter for therapeutic drug level monitoring: Secondary | ICD-10-CM | POA: Diagnosis not present

## 2017-01-28 LAB — POCT INR: INR: 3

## 2017-01-31 ENCOUNTER — Other Ambulatory Visit: Payer: Self-pay | Admitting: Internal Medicine

## 2017-01-31 ENCOUNTER — Telehealth: Payer: Self-pay | Admitting: Internal Medicine

## 2017-01-31 MED ORDER — PREDNISONE 10 MG PO TABS: ORAL_TABLET | ORAL | 0 refills | Status: DC

## 2017-01-31 NOTE — Telephone Encounter (Signed)
Improved on pred, worse since finished   Prednisone 10 mg take  4 each am x 2 days,   2 each am x 2 days,  1 each am x 2 days and stop   If gets worse to ER

## 2017-02-03 NOTE — Telephone Encounter (Signed)
Ok to refill x 6 months total

## 2017-02-03 NOTE — Telephone Encounter (Signed)
Refill for diazepoam 5mg .  Last OV 01/22/2017. Ok to refill CY?

## 2017-02-11 ENCOUNTER — Ambulatory Visit (INDEPENDENT_AMBULATORY_CARE_PROVIDER_SITE_OTHER): Payer: Medicare PPO | Admitting: *Deleted

## 2017-02-11 DIAGNOSIS — I4891 Unspecified atrial fibrillation: Secondary | ICD-10-CM | POA: Diagnosis not present

## 2017-02-11 DIAGNOSIS — Z5181 Encounter for therapeutic drug level monitoring: Secondary | ICD-10-CM | POA: Diagnosis not present

## 2017-02-11 LAB — POCT INR: INR: 3.3

## 2017-02-12 ENCOUNTER — Other Ambulatory Visit (HOSPITAL_COMMUNITY): Payer: Self-pay | Admitting: Registered Nurse

## 2017-02-12 DIAGNOSIS — Z1231 Encounter for screening mammogram for malignant neoplasm of breast: Secondary | ICD-10-CM

## 2017-02-16 ENCOUNTER — Other Ambulatory Visit: Payer: Self-pay | Admitting: Internal Medicine

## 2017-02-16 NOTE — Telephone Encounter (Signed)
Pt requesting tramadol refill  Last gave # 90 tablets on 12/02/16  Please advise if okay to refill, thanks!

## 2017-02-16 NOTE — Telephone Encounter (Signed)
Ok to refill x 6 months 

## 2017-03-04 ENCOUNTER — Ambulatory Visit (INDEPENDENT_AMBULATORY_CARE_PROVIDER_SITE_OTHER): Payer: Medicare PPO | Admitting: *Deleted

## 2017-03-04 DIAGNOSIS — I4891 Unspecified atrial fibrillation: Secondary | ICD-10-CM

## 2017-03-04 DIAGNOSIS — Z5181 Encounter for therapeutic drug level monitoring: Secondary | ICD-10-CM | POA: Diagnosis not present

## 2017-03-04 LAB — POCT INR: INR: 2.3

## 2017-03-20 ENCOUNTER — Ambulatory Visit (HOSPITAL_COMMUNITY): Payer: Medicare PPO

## 2017-04-01 ENCOUNTER — Ambulatory Visit (INDEPENDENT_AMBULATORY_CARE_PROVIDER_SITE_OTHER): Payer: Medicare PPO | Admitting: *Deleted

## 2017-04-01 DIAGNOSIS — I4891 Unspecified atrial fibrillation: Secondary | ICD-10-CM

## 2017-04-01 DIAGNOSIS — Z5181 Encounter for therapeutic drug level monitoring: Secondary | ICD-10-CM | POA: Diagnosis not present

## 2017-04-01 LAB — POCT INR: INR: 1.7

## 2017-04-07 DIAGNOSIS — Z0001 Encounter for general adult medical examination with abnormal findings: Secondary | ICD-10-CM | POA: Diagnosis not present

## 2017-04-07 DIAGNOSIS — E782 Mixed hyperlipidemia: Secondary | ICD-10-CM | POA: Diagnosis not present

## 2017-04-07 DIAGNOSIS — K219 Gastro-esophageal reflux disease without esophagitis: Secondary | ICD-10-CM | POA: Diagnosis not present

## 2017-04-07 DIAGNOSIS — Z6824 Body mass index (BMI) 24.0-24.9, adult: Secondary | ICD-10-CM | POA: Diagnosis not present

## 2017-04-07 DIAGNOSIS — I509 Heart failure, unspecified: Secondary | ICD-10-CM | POA: Diagnosis not present

## 2017-04-07 DIAGNOSIS — J4540 Moderate persistent asthma, uncomplicated: Secondary | ICD-10-CM | POA: Diagnosis not present

## 2017-04-07 DIAGNOSIS — N183 Chronic kidney disease, stage 3 (moderate): Secondary | ICD-10-CM | POA: Diagnosis not present

## 2017-04-07 DIAGNOSIS — E119 Type 2 diabetes mellitus without complications: Secondary | ICD-10-CM | POA: Diagnosis not present

## 2017-04-07 DIAGNOSIS — Z1389 Encounter for screening for other disorder: Secondary | ICD-10-CM | POA: Diagnosis not present

## 2017-04-07 DIAGNOSIS — J302 Other seasonal allergic rhinitis: Secondary | ICD-10-CM | POA: Diagnosis not present

## 2017-04-07 DIAGNOSIS — J322 Chronic ethmoidal sinusitis: Secondary | ICD-10-CM | POA: Diagnosis not present

## 2017-04-07 DIAGNOSIS — J449 Chronic obstructive pulmonary disease, unspecified: Secondary | ICD-10-CM | POA: Diagnosis not present

## 2017-04-07 DIAGNOSIS — I10 Essential (primary) hypertension: Secondary | ICD-10-CM | POA: Diagnosis not present

## 2017-04-08 ENCOUNTER — Other Ambulatory Visit: Payer: Self-pay | Admitting: Cardiology

## 2017-04-22 ENCOUNTER — Ambulatory Visit (INDEPENDENT_AMBULATORY_CARE_PROVIDER_SITE_OTHER): Payer: Medicare PPO | Admitting: *Deleted

## 2017-04-22 DIAGNOSIS — I4891 Unspecified atrial fibrillation: Secondary | ICD-10-CM

## 2017-04-22 DIAGNOSIS — Z5181 Encounter for therapeutic drug level monitoring: Secondary | ICD-10-CM

## 2017-04-22 LAB — POCT INR: INR: 2.6

## 2017-05-19 ENCOUNTER — Telehealth: Payer: Self-pay | Admitting: Internal Medicine

## 2017-05-19 NOTE — Telephone Encounter (Signed)
I called to check on [name], it's been awhile since she's been in for a xolair shot. She saw CY in 01/2017, she has since been dx with throat cancer. Pt. Decided to stop xolair. I will route this to CY to let him know.

## 2017-05-20 ENCOUNTER — Ambulatory Visit (INDEPENDENT_AMBULATORY_CARE_PROVIDER_SITE_OTHER): Payer: Medicare PPO | Admitting: *Deleted

## 2017-05-20 DIAGNOSIS — Z5181 Encounter for therapeutic drug level monitoring: Secondary | ICD-10-CM

## 2017-05-20 DIAGNOSIS — I4891 Unspecified atrial fibrillation: Secondary | ICD-10-CM | POA: Diagnosis not present

## 2017-05-20 LAB — POCT INR: INR: 2.7

## 2017-05-20 NOTE — Telephone Encounter (Signed)
Noted. I'm sorry for her cancer.

## 2017-05-26 DIAGNOSIS — D631 Anemia in chronic kidney disease: Secondary | ICD-10-CM | POA: Diagnosis not present

## 2017-05-26 DIAGNOSIS — I129 Hypertensive chronic kidney disease with stage 1 through stage 4 chronic kidney disease, or unspecified chronic kidney disease: Secondary | ICD-10-CM | POA: Diagnosis not present

## 2017-05-26 DIAGNOSIS — E876 Hypokalemia: Secondary | ICD-10-CM | POA: Diagnosis not present

## 2017-05-26 DIAGNOSIS — N2581 Secondary hyperparathyroidism of renal origin: Secondary | ICD-10-CM | POA: Diagnosis not present

## 2017-05-26 DIAGNOSIS — I959 Hypotension, unspecified: Secondary | ICD-10-CM | POA: Diagnosis not present

## 2017-05-26 DIAGNOSIS — N183 Chronic kidney disease, stage 3 (moderate): Secondary | ICD-10-CM | POA: Diagnosis not present

## 2017-06-17 ENCOUNTER — Ambulatory Visit (INDEPENDENT_AMBULATORY_CARE_PROVIDER_SITE_OTHER): Payer: Medicare PPO | Admitting: *Deleted

## 2017-06-17 DIAGNOSIS — Z5181 Encounter for therapeutic drug level monitoring: Secondary | ICD-10-CM

## 2017-06-17 DIAGNOSIS — I4891 Unspecified atrial fibrillation: Secondary | ICD-10-CM

## 2017-06-17 LAB — POCT INR: INR: 1.8

## 2017-06-24 ENCOUNTER — Other Ambulatory Visit: Payer: Self-pay | Admitting: Cardiology

## 2017-06-30 NOTE — Progress Notes (Signed)
Cardiology Office Note  Date: 07/01/2017   ID: [name], DOB [birth year], MRN [id number]  PCP: Sharilyn Sites, MD  Primary Cardiologist: [name], MD   Chief Complaint  Patient presents with  . Atrial Fibrillation    History of Present Illness: [name] is an 83 y.o. female last seen in April. She presents for a routine follow-up visit. Reports no major change since last encounter. No sense of palpitations or chest pain. She continues with chronic shortness of breath and recent flare in her allergies.  She continues to follow in the anticoagulation clinic on Coumadin. Recent INR 1.8. No reported bleeding episodes.  I personally reviewed her ECG today which shows rate-controlled atrial fibrillation with low voltage and nonspecific ST-T changes. She remains on Cardizem CD 240 mg daily and Lanoxin.  Past Medical History:  Diagnosis Date  . Asthma   . Atrial fibrillation ([facility])   . Chronic rhinitis   . COPD (chronic obstructive pulmonary disease) ([facility])   . GERD (gastroesophageal reflux disease)   . Mitral regurgitation   . Mitral valve prolapse   . Nasal polyps   . Nonischemic cardiomyopathy ([facility])    LVEF 25-30% improved to 45-50%  . Obstructive sleep apnea   . Transudative pleural effusion     Past Surgical History:  Procedure Laterality Date  . APPENDECTOMY  2000  . Bilateral cataract surgery    . LAPAROSCOPIC NISSEN FUNDOPLICATION  4098  . NOSE SURGERY      Current Outpatient Prescriptions  Medication Sig Dispense Refill  . albuterol (PROVENTIL HFA;VENTOLIN HFA) 108 (90 BASE) MCG/ACT inhaler Inhale 2 puffs into the lungs every 6 (six) hours as needed for wheezing or shortness of breath. 1 Inhaler 11  . albuterol (PROVENTIL) (2.5 MG/3ML) 0.083% nebulizer solution INHALE THE CONTENTS OF 1 VIAL USING NEBULIZER EVERY 4 HOURS AS NEEDED FOR WHEEZING OR SHORTNESS OF BREATH 90 mL 12  . atorvastatin (LIPITOR) 10 MG tablet Take 10 mg by mouth daily.      Marland Kitchen  azelastine (ASTELIN) 0.1 % nasal spray Place 1-2 sprays into both nostrils 2 (two) times daily. Use in each nostril as directed 30 mL prn  . Calcium Carbonate-Vitamin D (CALTRATE 600+D) 600-400 MG-UNIT per tablet Take 1 tablet by mouth daily.      . cetirizine (ZYRTEC) 10 MG tablet Take 10 mg by mouth daily.    . citalopram (CELEXA) 40 MG tablet Take 1 tablet by mouth daily.    . diazepam (VALIUM) 5 MG tablet TAKE 1 TABLET BY MOUTH EVERY 12 HOURS AS NEEDED 30 tablet 5  . digoxin (LANOXIN) 0.125 MG tablet TAKE 1 TABLET EVERY DAY 90 tablet 3  . diltiazem (CARDIZEM CD) 240 MG 24 hr capsule Take 1 capsule (240 mg total) by mouth daily. 90 capsule 3  . esomeprazole (NEXIUM) 40 MG capsule Take 40 mg by mouth 2 (two) times daily.      . furosemide (LASIX) 40 MG tablet TAKE 1 TABLET EVERY DAY 90 tablet 3  . potassium chloride SA (KLOR-CON M20) 20 MEQ tablet Take 1 tablet (20 mEq total) by mouth daily. 30 tablet 6  . predniSONE (DELTASONE) 10 MG tablet Take  4 each am x 2 days,   2 each am x 2 days,  1 each am x 2 days and stop 14 tablet 0  . traMADol (ULTRAM) 50 MG tablet TAKE 1 TABLET BY MOUTH EVERY 4 HOURS AS NEEDED 90 tablet 5  . umeclidinium-vilanterol (ANORO ELLIPTA) 62.5-25  MCG/INH AEPB Inhale 1 puff into the lungs daily. 1 each 0  . warfarin (COUMADIN) 5 MG tablet Take 1 tablet daily except 1/2 tablet on Saturdays 30 tablet 3   Current Facility-Administered Medications  Medication Dose Route Frequency Provider Last Rate Last Dose  . omalizumab Arvid Right) injection 300 mg  300 mg Subcutaneous Q14 Days [name] D, MD   300 mg at 08/04/16 1221   Allergies:  Amoxicillin-pot clavulanate; Clarithromycin; Clindamycin; Doxycycline; and Penicillins   Social History: The patient  reports that she quit smoking about 36 years ago. Her smoking use included Cigarettes. She has a 10.00 pack-year smoking history. She has never used smokeless tobacco. She reports that she does not drink alcohol or use drugs.     ROS:  Please see the history of present illness. Otherwise, complete review of systems is positive for chronic shortness of breath, recent cough, no fevers or chills.  All other systems are reviewed and negative.   Physical Exam: VS:  BP 110/70 (BP Location: Right Arm)   Pulse 68   Ht 5' 6.5" (1.689 m)   Wt 158 lb (71.7 kg)   SpO2 97%   BMI 25.12 kg/m , BMI Body mass index is 25.12 kg/m.  Wt Readings from Last 3 Encounters:  07/01/17 158 lb (71.7 kg)  01/22/17 163 lb 2 oz (74 kg)  12/25/16 165 lb (74.8 kg)    General: Elderly woman, appears comfortable at rest. HEENT: Conjunctiva and lids normal, oropharynx clear. Neck: Supple, no elevated JVP or carotid bruits, no thyromegaly. Lungs: Decreased breath sounds without wheezing, nonlabored breathing at rest. Cardiac: Irregularly irregular, no S3, soft apical systolic murmur. Abdomen: Soft, nontender, bowel sounds present, no guarding or rebound. Extremities: No pitting edema, distal pulses 2+. Skin: Warm and dry. Musculoskeletal: No kyphosis. Neuropsychiatric: Alert and oriented x3, affect grossly appropriate.  ECG: I personally reviewed the tracing from 03/25/2016 which shows rate-controlled atrial fibrillation with low voltage and nonspecific ST-T changes.  Recent Labwork: 07/16/2016: BUN 28; Creatinine, Ser 1.44; Potassium 4.2; Sodium 137   Other Studies Reviewed Today:  Echocardiogram 11/12/2015: Study Conclusions  - Left ventricle: The cavity size was normal. Wall thickness was   increased in a pattern of mild LVH. Systolic function was normal.   The estimated ejection fraction was in the range of 55% to 60%.   Wall motion was normal; there were no regional wall motion   abnormalities. - Aortic valve: Mildly calcified annulus. Trileaflet; mildly   thickened leaflets. Valve area (VTI): 1.89 cm^2. Valve area   (Vmax): 1.89 cm^2. - Mitral valve: Moderately calcified annulus. Mildly thickened   leaflets . The findings  are consistent with moderate stenosis.   There was mild to moderate regurgitation. Mean gradient (D): 7 mm   Hg. Peak gradient (D): 19 mm Hg. In setting of afib unable to   accurately calculate MV area by PHT or continuity equation. By   gradient there is moderate mitral stenosis. - Left atrium: The atrium was severely dilated. - Right atrium: The atrium was mildly dilated.  Assessment and Plan:  1. Chronic atrial fibrillation. Heart rate control is adequate on current dose of Cardizem CD and Lanoxin. Continue on Coumadin with follow-up in the anticoagulation clinic.  2. Mitral valve disease with evidence of moderate calcific mitral stenosis and mild to moderate mitral regurgitation by her most recent echocardiogram.  3. History of cardiomyopathy, tachycardia-mediated with subsequent normalization of LVEF in the range of 55-60%.  4. Intermittent leg edema, stable  control on Lasix with potassium supplements.  Current medicines were reviewed with the patient today.   Orders Placed This Encounter  Procedures  . EKG 12-Lead    Disposition: Follow-up in 6 months.   Signed, Satira Sark, MD, [facility] 07/01/2017 12:17 PM    [facility] at [facility] [address], [place], [place] [address] Phone: [phone]; Fax: [phone]

## 2017-07-01 ENCOUNTER — Encounter: Payer: Self-pay | Admitting: Cardiology

## 2017-07-01 ENCOUNTER — Ambulatory Visit (INDEPENDENT_AMBULATORY_CARE_PROVIDER_SITE_OTHER): Payer: Medicare PPO | Admitting: Cardiology

## 2017-07-01 VITALS — BP 110/70 | HR 68 | Ht 66.5 in | Wt 158.0 lb

## 2017-07-01 DIAGNOSIS — Z8679 Personal history of other diseases of the circulatory system: Secondary | ICD-10-CM | POA: Diagnosis not present

## 2017-07-01 DIAGNOSIS — I482 Chronic atrial fibrillation, unspecified: Secondary | ICD-10-CM

## 2017-07-01 DIAGNOSIS — I059 Rheumatic mitral valve disease, unspecified: Secondary | ICD-10-CM | POA: Diagnosis not present

## 2017-07-01 DIAGNOSIS — R600 Localized edema: Secondary | ICD-10-CM

## 2017-07-01 NOTE — Patient Instructions (Addendum)

## 2017-07-08 ENCOUNTER — Ambulatory Visit (INDEPENDENT_AMBULATORY_CARE_PROVIDER_SITE_OTHER): Payer: Medicare PPO | Admitting: *Deleted

## 2017-07-08 DIAGNOSIS — Z5181 Encounter for therapeutic drug level monitoring: Secondary | ICD-10-CM

## 2017-07-08 DIAGNOSIS — I4891 Unspecified atrial fibrillation: Secondary | ICD-10-CM | POA: Diagnosis not present

## 2017-07-08 LAB — POCT INR: INR: 2.7

## 2017-07-27 ENCOUNTER — Ambulatory Visit: Payer: Medicare PPO | Admitting: Internal Medicine

## 2017-08-05 ENCOUNTER — Ambulatory Visit (INDEPENDENT_AMBULATORY_CARE_PROVIDER_SITE_OTHER): Payer: Medicare PPO | Admitting: *Deleted

## 2017-08-05 DIAGNOSIS — I4891 Unspecified atrial fibrillation: Secondary | ICD-10-CM

## 2017-08-05 DIAGNOSIS — Z5181 Encounter for therapeutic drug level monitoring: Secondary | ICD-10-CM

## 2017-08-05 LAB — POCT INR: INR: 1.6

## 2017-08-24 ENCOUNTER — Ambulatory Visit (INDEPENDENT_AMBULATORY_CARE_PROVIDER_SITE_OTHER): Payer: Medicare PPO | Admitting: *Deleted

## 2017-08-24 DIAGNOSIS — Z5181 Encounter for therapeutic drug level monitoring: Secondary | ICD-10-CM

## 2017-08-24 DIAGNOSIS — I4891 Unspecified atrial fibrillation: Secondary | ICD-10-CM | POA: Diagnosis not present

## 2017-08-24 LAB — POCT INR: INR: 1.7

## 2017-09-05 ENCOUNTER — Other Ambulatory Visit: Payer: Self-pay | Admitting: Internal Medicine

## 2017-09-07 NOTE — Telephone Encounter (Signed)
Ok to refill x 6 months 

## 2017-09-07 NOTE — Telephone Encounter (Signed)
Received refill request for Valium 5 mg every 12 hours as needed, previous rx written 02/04/17. Patient was last seen 01/22/17 and was instructed to return in 6 months. Patient has not been seen since.  Dr. Annamaria Boots please advise.   Allergies  Allergen Reactions  . Amoxicillin-Pot Clavulanate   . Clarithromycin   . Clindamycin   . Doxycycline   . Penicillins       Current Outpatient Medications on File Prior to Visit  Medication Sig Dispense Refill  . albuterol (PROVENTIL HFA;VENTOLIN HFA) 108 (90 BASE) MCG/ACT inhaler Inhale 2 puffs into the lungs every 6 (six) hours as needed for wheezing or shortness of breath. 1 Inhaler 11  . albuterol (PROVENTIL) (2.5 MG/3ML) 0.083% nebulizer solution INHALE THE CONTENTS OF 1 VIAL USING NEBULIZER EVERY 4 HOURS AS NEEDED FOR WHEEZING OR SHORTNESS OF BREATH 90 mL 12  . atorvastatin (LIPITOR) 10 MG tablet Take 10 mg by mouth daily.      Marland Kitchen azelastine (ASTELIN) 0.1 % nasal spray Place 1-2 sprays into both nostrils 2 (two) times daily. Use in each nostril as directed 30 mL prn  . Calcium Carbonate-Vitamin D (CALTRATE 600+D) 600-400 MG-UNIT per tablet Take 1 tablet by mouth daily.      . cetirizine (ZYRTEC) 10 MG tablet Take 10 mg by mouth daily.    . citalopram (CELEXA) 40 MG tablet Take 1 tablet by mouth daily.    . diazepam (VALIUM) 5 MG tablet TAKE 1 TABLET BY MOUTH EVERY 12 HOURS AS NEEDED 30 tablet 5  . digoxin (LANOXIN) 0.125 MG tablet TAKE 1 TABLET EVERY DAY 90 tablet 3  . diltiazem (CARDIZEM CD) 240 MG 24 hr capsule Take 1 capsule (240 mg total) by mouth daily. 90 capsule 3  . esomeprazole (NEXIUM) 40 MG capsule Take 40 mg by mouth 2 (two) times daily.      . furosemide (LASIX) 40 MG tablet TAKE 1 TABLET EVERY DAY 90 tablet 3  . potassium chloride SA (KLOR-CON M20) 20 MEQ tablet Take 1 tablet (20 mEq total) by mouth daily. 30 tablet 6  . predniSONE (DELTASONE) 10 MG tablet Take  4 each am x 2 days,   2 each am x 2 days,  1 each am x 2 days and stop 14  tablet 0  . traMADol (ULTRAM) 50 MG tablet TAKE 1 TABLET BY MOUTH EVERY 4 HOURS AS NEEDED 90 tablet 5  . umeclidinium-vilanterol (ANORO ELLIPTA) 62.5-25 MCG/INH AEPB Inhale 1 puff into the lungs daily. 1 each 0  . warfarin (COUMADIN) 5 MG tablet Take 1 tablet daily except 1/2 tablet on Saturdays 30 tablet 3   Current Facility-Administered Medications on File Prior to Visit  Medication Dose Route Frequency Provider Last Rate Last Dose  . omalizumab Arvid Right) injection 300 mg  300 mg Subcutaneous Q14 Days [name] D, MD   300 mg at 08/04/16 1221

## 2017-09-14 ENCOUNTER — Ambulatory Visit (INDEPENDENT_AMBULATORY_CARE_PROVIDER_SITE_OTHER): Payer: Medicare PPO | Admitting: *Deleted

## 2017-09-14 DIAGNOSIS — I4891 Unspecified atrial fibrillation: Secondary | ICD-10-CM | POA: Diagnosis not present

## 2017-09-14 DIAGNOSIS — Z5181 Encounter for therapeutic drug level monitoring: Secondary | ICD-10-CM | POA: Diagnosis not present

## 2017-09-14 LAB — POCT INR: INR: 3

## 2017-09-14 NOTE — Patient Instructions (Signed)
Decrease coumadin to 1 tablet daily  Recheck in 3 weeks

## 2017-10-05 ENCOUNTER — Ambulatory Visit (INDEPENDENT_AMBULATORY_CARE_PROVIDER_SITE_OTHER): Payer: Medicare PPO | Admitting: *Deleted

## 2017-10-05 DIAGNOSIS — I4891 Unspecified atrial fibrillation: Secondary | ICD-10-CM

## 2017-10-05 DIAGNOSIS — Z5181 Encounter for therapeutic drug level monitoring: Secondary | ICD-10-CM | POA: Diagnosis not present

## 2017-10-05 DIAGNOSIS — Z7901 Long term (current) use of anticoagulants: Secondary | ICD-10-CM | POA: Diagnosis not present

## 2017-10-05 LAB — POCT INR: INR: 3.5

## 2017-10-05 NOTE — Patient Instructions (Signed)
Hold coumadin tonight then resume 1 tablet daily  Recheck in 3 weeks

## 2017-10-14 ENCOUNTER — Telehealth: Payer: Self-pay | Admitting: Cardiology

## 2017-10-14 MED ORDER — DILTIAZEM HCL ER COATED BEADS 240 MG PO CP24: 240.0000 mg | ORAL_CAPSULE | Freq: Every day | ORAL | 3 refills | Status: DC

## 2017-10-14 NOTE — Telephone Encounter (Signed)
Needs refill on Diltiazem sent to [facility] / tg

## 2017-10-14 NOTE — Addendum Note (Signed)
Addended by: Levonne Hubert on: 10/14/2017 04:13 PM   Modules accepted: Orders

## 2017-10-20 ENCOUNTER — Other Ambulatory Visit (HOSPITAL_COMMUNITY): Payer: Self-pay | Admitting: Family Medicine

## 2017-10-20 DIAGNOSIS — Z1231 Encounter for screening mammogram for malignant neoplasm of breast: Secondary | ICD-10-CM

## 2017-10-21 ENCOUNTER — Ambulatory Visit (HOSPITAL_COMMUNITY)
Admission: RE | Admit: 2017-10-21 | Discharge: 2017-10-21 | Disposition: A | Discharge status: Home / Self Care | Payer: Medicare PPO | Source: Ambulatory Visit | Attending: Family Medicine | Admitting: Family Medicine

## 2017-10-21 DIAGNOSIS — Z1231 Encounter for screening mammogram for malignant neoplasm of breast: Secondary | ICD-10-CM | POA: Diagnosis not present

## 2017-10-25 ENCOUNTER — Other Ambulatory Visit: Payer: Self-pay | Admitting: Cardiology

## 2017-10-28 ENCOUNTER — Ambulatory Visit (INDEPENDENT_AMBULATORY_CARE_PROVIDER_SITE_OTHER): Payer: Medicare PPO | Admitting: *Deleted

## 2017-10-28 DIAGNOSIS — Z5181 Encounter for therapeutic drug level monitoring: Secondary | ICD-10-CM | POA: Diagnosis not present

## 2017-10-28 DIAGNOSIS — I4891 Unspecified atrial fibrillation: Secondary | ICD-10-CM

## 2017-10-28 LAB — POCT INR: INR: 2.5

## 2017-10-28 NOTE — Patient Instructions (Signed)
Continue coumadin 1 tablet daily Recheck in 3 weeks 

## 2017-11-16 ENCOUNTER — Ambulatory Visit (INDEPENDENT_AMBULATORY_CARE_PROVIDER_SITE_OTHER): Payer: Medicare PPO | Admitting: *Deleted

## 2017-11-16 DIAGNOSIS — Z7901 Long term (current) use of anticoagulants: Secondary | ICD-10-CM | POA: Diagnosis not present

## 2017-11-16 DIAGNOSIS — Z5181 Encounter for therapeutic drug level monitoring: Secondary | ICD-10-CM

## 2017-11-16 DIAGNOSIS — I4891 Unspecified atrial fibrillation: Secondary | ICD-10-CM | POA: Diagnosis not present

## 2017-11-16 LAB — POCT INR: INR: 1.7

## 2017-11-16 NOTE — Patient Instructions (Signed)
Take coumadin 1 1/2 tablets tonight then resume 1 tablet daily  Pending Mohs Procedure of forehead on 11/23/17  Dr Izora Ribas  [facility]  Fax [phone] Recheck in 2 weeks

## 2017-11-17 ENCOUNTER — Other Ambulatory Visit: Payer: Self-pay | Admitting: Internal Medicine

## 2017-11-17 NOTE — Telephone Encounter (Signed)
CY Please advise on refill. Thanks.  

## 2017-11-17 NOTE — Telephone Encounter (Signed)
Ok to refill 

## 2017-11-23 DIAGNOSIS — C44319 Basal cell carcinoma of skin of other parts of face: Secondary | ICD-10-CM | POA: Diagnosis not present

## 2017-12-07 ENCOUNTER — Ambulatory Visit (INDEPENDENT_AMBULATORY_CARE_PROVIDER_SITE_OTHER): Payer: Medicare PPO | Admitting: *Deleted

## 2017-12-07 DIAGNOSIS — Z5181 Encounter for therapeutic drug level monitoring: Secondary | ICD-10-CM

## 2017-12-07 DIAGNOSIS — I4891 Unspecified atrial fibrillation: Secondary | ICD-10-CM | POA: Diagnosis not present

## 2017-12-07 LAB — POCT INR: INR: 1.6

## 2017-12-07 NOTE — Patient Instructions (Signed)
Take coumadin 1 1/2 tablets tonight and tomorrow night then resume 1 tablet daily  Recheck in 2 weeks

## 2017-12-08 DIAGNOSIS — H6593 Unspecified nonsuppurative otitis media, bilateral: Secondary | ICD-10-CM | POA: Diagnosis not present

## 2017-12-08 DIAGNOSIS — J029 Acute pharyngitis, unspecified: Secondary | ICD-10-CM | POA: Diagnosis not present

## 2017-12-08 DIAGNOSIS — Z6823 Body mass index (BMI) 23.0-23.9, adult: Secondary | ICD-10-CM | POA: Diagnosis not present

## 2017-12-08 DIAGNOSIS — Z1389 Encounter for screening for other disorder: Secondary | ICD-10-CM | POA: Diagnosis not present

## 2017-12-08 DIAGNOSIS — H6123 Impacted cerumen, bilateral: Secondary | ICD-10-CM | POA: Diagnosis not present

## 2017-12-21 ENCOUNTER — Ambulatory Visit (INDEPENDENT_AMBULATORY_CARE_PROVIDER_SITE_OTHER): Payer: Medicare PPO | Admitting: *Deleted

## 2017-12-21 DIAGNOSIS — Z7901 Long term (current) use of anticoagulants: Secondary | ICD-10-CM | POA: Diagnosis not present

## 2017-12-21 DIAGNOSIS — I4891 Unspecified atrial fibrillation: Secondary | ICD-10-CM | POA: Diagnosis not present

## 2017-12-21 DIAGNOSIS — Z5181 Encounter for therapeutic drug level monitoring: Secondary | ICD-10-CM | POA: Diagnosis not present

## 2017-12-21 LAB — POCT INR: INR: 2.7

## 2017-12-21 NOTE — Patient Instructions (Signed)
Continue coumadin 1 tablet daily Recheck in 3 weeks 

## 2018-01-13 ENCOUNTER — Ambulatory Visit (INDEPENDENT_AMBULATORY_CARE_PROVIDER_SITE_OTHER): Payer: Medicare PPO | Admitting: *Deleted

## 2018-01-13 DIAGNOSIS — I4891 Unspecified atrial fibrillation: Secondary | ICD-10-CM | POA: Diagnosis not present

## 2018-01-13 DIAGNOSIS — Z5181 Encounter for therapeutic drug level monitoring: Secondary | ICD-10-CM

## 2018-01-13 LAB — POCT INR: INR: 2.4

## 2018-01-13 NOTE — Patient Instructions (Signed)
Continue coumadin 1 tablet daily Recheck in 4 weeks 

## 2018-01-15 DIAGNOSIS — Z6823 Body mass index (BMI) 23.0-23.9, adult: Secondary | ICD-10-CM | POA: Diagnosis not present

## 2018-01-15 DIAGNOSIS — B029 Zoster without complications: Secondary | ICD-10-CM | POA: Diagnosis not present

## 2018-01-20 ENCOUNTER — Telehealth: Payer: Self-pay | Admitting: Cardiology

## 2018-01-20 NOTE — Telephone Encounter (Signed)
Called pt, no answer. Left msg to call back

## 2018-01-20 NOTE — Telephone Encounter (Signed)
Patient states that she has been on Prednisone and Valacyclovir. Please call with instructions. / tg

## 2018-02-02 ENCOUNTER — Other Ambulatory Visit: Payer: Self-pay

## 2018-02-02 MED ORDER — FUROSEMIDE 40 MG PO TABS: 40.0000 mg | ORAL_TABLET | Freq: Every day | ORAL | 3 refills | Status: DC

## 2018-02-02 NOTE — Telephone Encounter (Signed)
refilled lasix per fax request

## 2018-02-10 ENCOUNTER — Ambulatory Visit (INDEPENDENT_AMBULATORY_CARE_PROVIDER_SITE_OTHER): Payer: Medicare PPO | Admitting: *Deleted

## 2018-02-10 DIAGNOSIS — Z5181 Encounter for therapeutic drug level monitoring: Secondary | ICD-10-CM

## 2018-02-10 DIAGNOSIS — I4891 Unspecified atrial fibrillation: Secondary | ICD-10-CM | POA: Diagnosis not present

## 2018-02-10 LAB — POCT INR: INR: 2.1 (ref 2.0–3.0)

## 2018-02-10 NOTE — Patient Instructions (Signed)
Continue coumadin 1 tablet daily Recheck in 6 weeks 

## 2018-03-03 ENCOUNTER — Other Ambulatory Visit: Payer: Self-pay | Admitting: Cardiology

## 2018-03-09 ENCOUNTER — Ambulatory Visit: Payer: Medicare PPO | Admitting: Internal Medicine

## 2018-03-26 ENCOUNTER — Ambulatory Visit (INDEPENDENT_AMBULATORY_CARE_PROVIDER_SITE_OTHER): Payer: Medicare PPO | Admitting: *Deleted

## 2018-03-26 DIAGNOSIS — Z5181 Encounter for therapeutic drug level monitoring: Secondary | ICD-10-CM

## 2018-03-26 DIAGNOSIS — I482 Chronic atrial fibrillation, unspecified: Secondary | ICD-10-CM

## 2018-03-26 LAB — POCT INR: INR: 1.2 — AB (ref 2.0–3.0)

## 2018-03-26 NOTE — Patient Instructions (Signed)
Take coumadin 1 1/2 tablets x 3 days then resume 1 tablet daily  Recheck in 1 week

## 2018-04-05 ENCOUNTER — Ambulatory Visit (INDEPENDENT_AMBULATORY_CARE_PROVIDER_SITE_OTHER): Payer: Medicare PPO | Admitting: *Deleted

## 2018-04-05 DIAGNOSIS — I482 Chronic atrial fibrillation, unspecified: Secondary | ICD-10-CM

## 2018-04-05 DIAGNOSIS — Z5181 Encounter for therapeutic drug level monitoring: Secondary | ICD-10-CM | POA: Diagnosis not present

## 2018-04-05 LAB — POCT INR: INR: 1.6 — AB (ref 2.0–3.0)

## 2018-04-05 NOTE — Patient Instructions (Signed)
Increase coumadin to 1 tablet daily except 1 1/2 tablets on Mondays, Wednesdays and Fridays Recheck in 1 week

## 2018-04-14 ENCOUNTER — Ambulatory Visit (INDEPENDENT_AMBULATORY_CARE_PROVIDER_SITE_OTHER): Payer: Medicare PPO | Admitting: *Deleted

## 2018-04-14 DIAGNOSIS — I482 Chronic atrial fibrillation, unspecified: Secondary | ICD-10-CM

## 2018-04-14 DIAGNOSIS — Z5181 Encounter for therapeutic drug level monitoring: Secondary | ICD-10-CM

## 2018-04-14 LAB — POCT INR: INR: 2.8 (ref 2.0–3.0)

## 2018-04-14 NOTE — Patient Instructions (Signed)
Continue coumadin 1 tablet daily except 1 1/2 tablets on Mondays, Wednesdays and Fridays Recheck in 3 weeks

## 2018-04-16 ENCOUNTER — Other Ambulatory Visit: Payer: Self-pay | Admitting: Cardiology

## 2018-04-16 MED ORDER — DIGOXIN 125 MCG PO TABS: 125.0000 ug | ORAL_TABLET | Freq: Every day | ORAL | 0 refills | Status: DC

## 2018-04-16 NOTE — Telephone Encounter (Signed)
Patient needs refill on Digoxin sent to [facility] (30 day supply). Please send RX to [facility] as well as that is who she uses. / tg

## 2018-04-22 DIAGNOSIS — Z0001 Encounter for general adult medical examination with abnormal findings: Secondary | ICD-10-CM | POA: Diagnosis not present

## 2018-04-22 DIAGNOSIS — I509 Heart failure, unspecified: Secondary | ICD-10-CM | POA: Diagnosis not present

## 2018-04-22 DIAGNOSIS — E782 Mixed hyperlipidemia: Secondary | ICD-10-CM | POA: Diagnosis not present

## 2018-04-22 DIAGNOSIS — R7309 Other abnormal glucose: Secondary | ICD-10-CM | POA: Diagnosis not present

## 2018-04-22 DIAGNOSIS — Z6824 Body mass index (BMI) 24.0-24.9, adult: Secondary | ICD-10-CM | POA: Diagnosis not present

## 2018-04-22 DIAGNOSIS — I4891 Unspecified atrial fibrillation: Secondary | ICD-10-CM | POA: Diagnosis not present

## 2018-04-22 DIAGNOSIS — N183 Chronic kidney disease, stage 3 (moderate): Secondary | ICD-10-CM | POA: Diagnosis not present

## 2018-04-22 DIAGNOSIS — H6123 Impacted cerumen, bilateral: Secondary | ICD-10-CM | POA: Diagnosis not present

## 2018-04-22 DIAGNOSIS — Z1389 Encounter for screening for other disorder: Secondary | ICD-10-CM | POA: Diagnosis not present

## 2018-04-22 DIAGNOSIS — E538 Deficiency of other specified B group vitamins: Secondary | ICD-10-CM | POA: Diagnosis not present

## 2018-04-22 DIAGNOSIS — J4540 Moderate persistent asthma, uncomplicated: Secondary | ICD-10-CM | POA: Diagnosis not present

## 2018-05-04 ENCOUNTER — Other Ambulatory Visit: Payer: Self-pay | Admitting: Cardiology

## 2018-05-04 MED ORDER — DIGOXIN 125 MCG PO TABS: 125.0000 ug | ORAL_TABLET | Freq: Every day | ORAL | 3 refills | Status: DC

## 2018-05-04 NOTE — Telephone Encounter (Signed)
E-scribed refill per request

## 2018-05-04 NOTE — Telephone Encounter (Signed)
Needs refill on Digoxin sent to [facility]. Patient scheduled f/u for 10/29. / tg

## 2018-05-13 ENCOUNTER — Other Ambulatory Visit: Payer: Self-pay | Admitting: Cardiology

## 2018-05-14 ENCOUNTER — Ambulatory Visit (INDEPENDENT_AMBULATORY_CARE_PROVIDER_SITE_OTHER): Payer: Medicare PPO | Admitting: *Deleted

## 2018-05-14 DIAGNOSIS — I482 Chronic atrial fibrillation, unspecified: Secondary | ICD-10-CM

## 2018-05-14 DIAGNOSIS — Z5181 Encounter for therapeutic drug level monitoring: Secondary | ICD-10-CM | POA: Diagnosis not present

## 2018-05-14 LAB — POCT INR: INR: 2.7 (ref 2.0–3.0)

## 2018-05-14 NOTE — Patient Instructions (Signed)
Continue coumadin 1 tablet daily except 1 1/2 tablets on Mondays, Wednesdays and Fridays Recheck in 4 weeks

## 2018-05-18 DIAGNOSIS — J9801 Acute bronchospasm: Secondary | ICD-10-CM | POA: Diagnosis not present

## 2018-05-18 DIAGNOSIS — J449 Chronic obstructive pulmonary disease, unspecified: Secondary | ICD-10-CM | POA: Diagnosis not present

## 2018-05-18 DIAGNOSIS — J329 Chronic sinusitis, unspecified: Secondary | ICD-10-CM | POA: Diagnosis not present

## 2018-05-18 DIAGNOSIS — J441 Chronic obstructive pulmonary disease with (acute) exacerbation: Secondary | ICD-10-CM | POA: Diagnosis not present

## 2018-05-18 DIAGNOSIS — I10 Essential (primary) hypertension: Secondary | ICD-10-CM | POA: Diagnosis not present

## 2018-05-18 DIAGNOSIS — E119 Type 2 diabetes mellitus without complications: Secondary | ICD-10-CM | POA: Diagnosis not present

## 2018-05-18 DIAGNOSIS — Z6822 Body mass index (BMI) 22.0-22.9, adult: Secondary | ICD-10-CM | POA: Diagnosis not present

## 2018-05-22 ENCOUNTER — Emergency Department (HOSPITAL_COMMUNITY)
Admission: EM | Admit: 2018-05-22 | Discharge: 2018-05-22 | Disposition: A | Discharge status: Home / Self Care | Payer: Medicare PPO | Attending: Emergency Medicine | Admitting: Emergency Medicine

## 2018-05-22 ENCOUNTER — Encounter (HOSPITAL_COMMUNITY): Payer: Self-pay | Admitting: Emergency Medicine

## 2018-05-22 ENCOUNTER — Emergency Department (HOSPITAL_COMMUNITY): Payer: Medicare PPO

## 2018-05-22 ENCOUNTER — Other Ambulatory Visit: Payer: Self-pay

## 2018-05-22 DIAGNOSIS — I4891 Unspecified atrial fibrillation: Secondary | ICD-10-CM | POA: Insufficient documentation

## 2018-05-22 DIAGNOSIS — Z7901 Long term (current) use of anticoagulants: Secondary | ICD-10-CM | POA: Insufficient documentation

## 2018-05-22 DIAGNOSIS — J449 Chronic obstructive pulmonary disease, unspecified: Secondary | ICD-10-CM | POA: Insufficient documentation

## 2018-05-22 DIAGNOSIS — Z79899 Other long term (current) drug therapy: Secondary | ICD-10-CM | POA: Insufficient documentation

## 2018-05-22 DIAGNOSIS — R0602 Shortness of breath: Secondary | ICD-10-CM | POA: Diagnosis not present

## 2018-05-22 DIAGNOSIS — J189 Pneumonia, unspecified organism: Secondary | ICD-10-CM | POA: Diagnosis not present

## 2018-05-22 DIAGNOSIS — Z87891 Personal history of nicotine dependence: Secondary | ICD-10-CM | POA: Diagnosis not present

## 2018-05-22 DIAGNOSIS — J181 Lobar pneumonia, unspecified organism: Secondary | ICD-10-CM

## 2018-05-22 LAB — BASIC METABOLIC PANEL
Anion gap: 9 (ref 5–15)
BUN: 30 mg/dL — AB (ref 8–23)
CALCIUM: 8.9 mg/dL (ref 8.9–10.3)
CHLORIDE: 101 mmol/L (ref 98–111)
CO2: 25 mmol/L (ref 22–32)
CREATININE: 1.23 mg/dL — AB (ref 0.44–1.00)
GFR calc Af Amer: 46 mL/min — ABNORMAL LOW (ref >60)
GFR calc non Af Amer: 40 mL/min — ABNORMAL LOW (ref >60)
Glucose, Bld: 122 mg/dL — ABNORMAL HIGH (ref 70–99)
Potassium: 4.3 mmol/L (ref 3.5–5.1)
SODIUM: 135 mmol/L (ref 135–145)

## 2018-05-22 LAB — CBC WITH DIFFERENTIAL/PLATELET
BASOS PCT: 0 %
Basophils Absolute: 0 10*3/uL (ref 0.0–0.1)
EOS ABS: 0 10*3/uL (ref 0.0–0.7)
EOS PCT: 0 %
HCT: 37.2 % (ref 36.0–46.0)
HEMOGLOBIN: 12.4 g/dL (ref 12.0–15.0)
LYMPHS ABS: 0.5 10*3/uL — AB (ref 0.7–4.0)
Lymphocytes Relative: 5 %
MCH: 29.6 pg (ref 26.0–34.0)
MCHC: 33.3 g/dL (ref 30.0–36.0)
MCV: 88.8 fL (ref 78.0–100.0)
MONO ABS: 0.6 10*3/uL (ref 0.1–1.0)
MONOS PCT: 6 %
Neutro Abs: 8.9 10*3/uL — ABNORMAL HIGH (ref 1.7–7.7)
Neutrophils Relative %: 89 %
PLATELETS: 172 10*3/uL (ref 150–400)
RBC: 4.19 MIL/uL (ref 3.87–5.11)
RDW: 14.1 % (ref 11.5–15.5)
WBC: 10 10*3/uL (ref 4.0–10.5)

## 2018-05-22 LAB — PROTIME-INR
INR: 2.69
Prothrombin Time: 28.4 seconds — ABNORMAL HIGH (ref 11.4–15.2)

## 2018-05-22 LAB — LACTIC ACID, PLASMA: LACTIC ACID, VENOUS: 1.5 mmol/L (ref 0.5–1.9)

## 2018-05-22 MED ORDER — LEVOFLOXACIN 500 MG PO TABS: 500.0000 mg | ORAL_TABLET | Freq: Every day | ORAL | 0 refills | Status: DC

## 2018-05-22 MED ORDER — PREDNISONE 10 MG PO TABS: 40.0000 mg | ORAL_TABLET | Freq: Every day | ORAL | 0 refills | Status: AC

## 2018-05-22 MED ORDER — ALBUTEROL SULFATE (2.5 MG/3ML) 0.083% IN NEBU
5.0000 mg | INHALATION_SOLUTION | Freq: Once | RESPIRATORY_TRACT | Status: AC
  Administered 2018-05-22: 5 mg via RESPIRATORY_TRACT
  Filled 2018-05-22: qty 6

## 2018-05-22 MED ORDER — LEVOFLOXACIN 500 MG PO TABS
500.0000 mg | ORAL_TABLET | Freq: Once | ORAL | Status: AC
  Administered 2018-05-22: 500 mg via ORAL
  Filled 2018-05-22: qty 1

## 2018-05-22 MED ORDER — PREDNISONE 20 MG PO TABS
40.0000 mg | ORAL_TABLET | Freq: Once | ORAL | Status: AC
  Administered 2018-05-22: 40 mg via ORAL
  Filled 2018-05-22: qty 2

## 2018-05-22 MED ORDER — LEVOFLOXACIN 750 MG PO TABS: 750.0000 mg | ORAL_TABLET | Freq: Once | ORAL | Status: DC

## 2018-05-22 NOTE — ED Triage Notes (Signed)
Congestion and shortness of breathe x one week. Seen by PCP last Tuesday and was prescribed abx causing GI upset. Last breathing treatment of albuterol this morning at 0600.

## 2018-05-22 NOTE — ED Provider Notes (Signed)
[facility] EMERGENCY DEPARTMENT Provider Note   CSN: [id number] Arrival date & time: 05/22/18  1053     History   Chief Complaint Chief Complaint  Patient presents with  . Shortness of Breath    HPI [name] is a 84 y.o. female with a history of COPD, chronic afib on coumadin therapy cardiomyopathy and chronic sinus issues presenting with a 1.5 week history of uri type symptoms which have "settled in her chest".  She developed nasal congestion last week associated with post nasal drip, after which she developed a cough with scant sputum production but with increased wheezing and sob. She was seen by her pcp and placed on doxycycline which she took 5 doses but stopped 2 days ago after it started causing n/v (which is better).  Her cough has been more productive with yellow non bloody sputum production and increased wheezing and sob.  She has had subjective fevers, no further n/v since stopping the doxycycline.  She denies cp, abdominal pain, but does endorse generalized fatigue without arthralgia, myalgia or other symptoms.  Her last breathing tx of albuterol was around 6 am today which gives temporary relief.  The history is provided by the patient.    Past Medical History:  Diagnosis Date  . Asthma   . Atrial fibrillation ([place])   . Chronic rhinitis   . COPD (chronic obstructive pulmonary disease) ([place])   . GERD (gastroesophageal reflux disease)   . Mitral regurgitation   . Mitral valve prolapse   . Nasal polyps   . Nonischemic cardiomyopathy (H[facility])    LVEF 25-30% improved to 45-50%  . Obstructive sleep apnea   . Transudative pleural effusion     Patient Active Problem List   Diagnosis Date Noted  . Hoarseness 07/22/2015  . Acute rhinitis 03/18/2015  . Sinusitis, acute maxillary 04/02/2014  . Encounter for therapeutic drug monitoring 10/05/2013  . Shingles 09/06/2013  . Postherpetic neuralgia 09/04/2013  . Arthritis, lumbar spine 04/20/2013  . Back pain  04/20/2013  . Mild anxiety 08/20/2012  . Allergic rhinitis due to pollen 11/30/2011  . Lung nodules 11/30/2011  . Renal insufficiency 01/16/2011  . Long term current use of anticoagulant 12/19/2010  . ANEMIA 10/09/2010  . Secondary cardiomyopathy ([place]) 06/21/2010  . Atrial fibrillation ([place]) 05/28/2010  . MITRAL REGURGITATION 05/25/2008  . BARRETT'S ESOPHAGUS, HX OF 05/25/2008  . Asthma with COPD (chronic obstructive pulmonary disease) ([place]) 11/12/2007  . Obstructive sleep apnea 11/12/2007  . MITRAL VALVE PROLAPSE 09/03/2007    Past Surgical History:  Procedure Laterality Date  . APPENDECTOMY  2000  . Bilateral cataract surgery    . LAPAROSCOPIC NISSEN FUNDOPLICATION  6629  . NOSE SURGERY       OB History   None      Home Medications    Prior to Admission medications   Medication Sig Start Date End Date Taking? Authorizing Provider  albuterol (PROVENTIL HFA;VENTOLIN HFA) 108 (90 BASE) MCG/ACT inhaler Inhale 2 puffs into the lungs every 6 (six) hours as needed for wheezing or shortness of breath. 11/13/14  Yes Young, Clinton D, MD  albuterol (PROVENTIL) (2.5 MG/3ML) 0.083% nebulizer solution INHALE THE CONTENTS OF 1 VIAL USING NEBULIZER EVERY 4 HOURS AS NEEDED FOR WHEEZING OR SHORTNESS OF BREATH 08/05/16  Yes Young, Clinton D, MD  atorvastatin (LIPITOR) 10 MG tablet Take 10 mg by mouth daily.     Yes [provider]  citalopram (CELEXA) 40 MG tablet Take 1 tablet by mouth daily. 01/01/15  Yes [provider]  diazepam (VALIUM) 5 MG tablet TAKE 1 TABLET BY MOUTH EVERY 12 HOURS AS NEEDED 09/07/17  Yes Young, Tarri Fuller D, MD  digoxin (LANOXIN) 0.125 MG tablet Take 1 tablet (125 mcg total) by mouth daily. 05/04/18  Yes Satira Sark, MD  diltiazem (CARDIZEM CD) 240 MG 24 hr capsule Take 1 capsule (240 mg total) by mouth daily. 10/14/17  Yes Satira Sark, MD  esomeprazole (NEXIUM) 40 MG capsule Take 40 mg by mouth 2 (two) times daily.     Yes [provider]  furosemide (LASIX) 40 MG tablet Take 1 tablet (40 mg total) by mouth daily. 02/02/18  Yes Satira Sark, MD  potassium chloride SA (KLOR-CON M20) 20 MEQ tablet Take 1 tablet (20 mEq total) by mouth daily. 10/09/11  Yes Satira Sark, MD  traMADol (ULTRAM) 50 MG tablet TAKE 1 TABLET EVERY 4 HOURS AS NEEDED 11/17/17  Yes [name] D, MD  warfarin (COUMADIN) 5 MG tablet TAKE 1 TABLET BY MOUTH DAILY OR AS DIRECTED 03/03/18  Yes Satira Sark, MD  levofloxacin (LEVAQUIN) 500 MG tablet Take 1 tablet (500 mg total) by mouth daily. 05/22/18   Evalee Jefferson, PA-C  predniSONE (DELTASONE) 10 MG tablet Take 4 tablets (40 mg total) by mouth daily for 4 days. 05/22/18 05/26/18  Evalee Jefferson, PA-C    Family History Family History  Problem Relation Age of Onset  . Cancer Mother        Colon  . Cancer Father        Colon  . Atrial fibrillation Sister   . Cancer Brother        Lung  . Cancer Brother        Lung    Social History Social History   Tobacco Use  . Smoking status: Former Smoker    Packs/day: 0.50    Years: 20.00    Pack years: 10.00    Types: Cigarettes    Last attempt to quit: 10/08/1980    Years since quitting: 37.6  . Smokeless tobacco: Never Used  Substance Use Topics  . Alcohol use: No    Alcohol/week: 0.0 standard drinks  . Drug use: No     Allergies   Amoxicillin-pot clavulanate; Clarithromycin; Clindamycin; Doxycycline; and Penicillins   Review of Systems Review of Systems  Constitutional: Positive for fever.  HENT: Positive for sinus pressure. Negative for congestion, rhinorrhea and sore throat.   Eyes: Negative.   Respiratory: Positive for cough, shortness of breath and wheezing. Negative for chest tightness.   Cardiovascular: Negative for chest pain and palpitations.  Gastrointestinal: Negative for abdominal pain, nausea and vomiting.  Genitourinary: Negative.   Musculoskeletal: Negative for arthralgias and myalgias.  Skin:  Negative.  Negative for rash and wound.  Neurological: Negative for dizziness, weakness, light-headedness, numbness and headaches.  Psychiatric/Behavioral: Negative.      Physical Exam Updated Vital Signs BP 105/62 (BP Location: Right Arm)   Pulse 95   Temp 98.9 F (37.2 C) (Oral)   Resp 20   Ht 5\' 6"  (1.676 m)   Wt 64.4 kg   SpO2 95%   BMI 22.92 kg/m   Physical Exam  Constitutional: She appears well-developed and well-nourished. She does not appear ill. No distress.  Initial bp hypotensive at 94/71.  Repeat normotensive.  HENT:  Head: Normocephalic and atraumatic.  Eyes: Conjunctivae are normal.  Neck: Normal range of motion.  Cardiovascular: Normal rate, normal heart sounds and intact distal pulses.  An irregularly irregular rhythm present.  Pulses:      Radial pulses are 2+ on the right side, and 2+ on the left side.       Dorsalis pedis pulses are 2+ on the right side, and 2+ on the left side.  No peripheral edema.  Pulmonary/Chest: No accessory muscle usage. Tachypnea noted. No respiratory distress. She has decreased breath sounds. She has wheezes in the right lower field and the left lower field. She has rhonchi. She has no rales.  Abdominal: Soft. Bowel sounds are normal. There is no tenderness.  Musculoskeletal: Normal range of motion.  Neurological: She is alert.  Skin: Skin is warm and dry.  Psychiatric: She has a normal mood and affect.  Nursing note and vitals reviewed.    ED Treatments / Results  Labs (all labs ordered are listed, but only abnormal results are displayed) Labs Reviewed  CBC WITH DIFFERENTIAL/PLATELET - Abnormal; Notable for the following components:      Result Value   Neutro Abs 8.9 (*)    Lymphs Abs 0.5 (*)    All other components within normal limits  BASIC METABOLIC PANEL - Abnormal; Notable for the following components:   Glucose, Bld 122 (*)    BUN 30 (*)    Creatinine, Ser 1.23 (*)    GFR calc non Af Amer 40 (*)    GFR calc Af  Amer 46 (*)    All other components within normal limits  PROTIME-INR - Abnormal; Notable for the following components:   Prothrombin Time 28.4 (*)    All other components within normal limits  LACTIC ACID, PLASMA    EKG EKG Interpretation  Date/Time:  Saturday May 22 2018 11:06:15 EDT Ventricular Rate:  85 PR Interval:    QRS Duration: 89 QT Interval:  334 QTC Calculation: 398 R Axis:   40 Text Interpretation:  Atrial fibrillation Low voltage, precordial leads Borderline repolarization abnormality Confirmed by Virgel Manifold [phone]) on 05/22/2018 12:40:21 PM   Radiology Dg Chest 2 View  Result Date: 05/22/2018 CLINICAL DATA:  SOB, CONGESTION, PER ER NOTE, Congestion and shortness of breathe x one week. Seen by PCP last Tuesday and was prescribed abx causing GI upset. Last breathing treatment of albuterol this morning at 0600. HISTORY OF ATRIAL-FIB, COPD, ASTHMA, GERD EXAM: CHEST - 2 VIEW COMPARISON:  07/16/2015 FINDINGS: Small right pleural effusion, slightly increased. Worsening atelectasis/consolidation at the right lung base. Minimal linear scarring at the left lung base stable. No overt interstitial edema. Stable cardiomegaly.  Aortic Atherosclerosis (ICD10-170.0). No pneumothorax. Visualized bones unremarkable. IMPRESSION: 1. Worsening right lower lobe infiltrate/atelectasis with adjacent effusion. 2. Stable cardiomegaly Electronically Signed   By: Lucrezia Europe M.D.   On: 05/22/2018 13:31    Procedures Procedures (including critical care time)  Medications Ordered in ED Medications  albuterol (PROVENTIL) (2.5 MG/3ML) 0.083% nebulizer solution 5 mg (5 mg Nebulization Given 05/22/18 1136)  predniSONE (DELTASONE) tablet 40 mg (40 mg Oral Given 05/22/18 1410)  levofloxacin (LEVAQUIN) tablet 500 mg (500 mg Oral Given 05/22/18 1410)     Initial Impression / Assessment and Plan / ED Course  I have reviewed the triage vital signs and the nursing notes.  Pertinent labs & imaging  results that were available during my care of the patient were reviewed by me and considered in my medical decision making (see chart for details).     Pt not tachypneic while here, several episodes of tachypnea to 40 on monitor, but when counted manually, resp  rate 18-22.  No hypoxia. She reports sx much improved after neb tx.  Discussed admission given cxr findings, age and comorbidities. Pt refuses to stay.  Dg at bedside with whom she lives, comfortable with mother returning home with her.  She was given strict return precautions including increased weakness, fevers, worsened sob. Pt and dg understand and agree with plan.  Will start on levaquin in place of the doxycycline which she did not tolerate. Also prescribed pulse dosing prednisone 40 mg, first doses here.  Home neb tx as she is currently doing prn.    Pt was seen by Dr Roderic Palau during todays visit and plan discussed.  Final Clinical Impressions(s) / ED Diagnoses   Final diagnoses:  Community acquired pneumonia of right lower lobe of lung [facility])    ED Discharge Orders         Ordered    levofloxacin (LEVAQUIN) 500 MG tablet  Daily     05/22/18 1402    predniSONE (DELTASONE) 10 MG tablet  Daily     05/22/18 1402           Evalee Jefferson, PA-C 05/22/18 1557    Milton Ferguson, MD 05/22/18 1600

## 2018-05-22 NOTE — Discharge Instructions (Signed)
You have pneumonia and are choosing to treat your symptoms at home instead of being admitted today.  Please return here if you develop any worsening symptoms including fevers, shortness of breath or increased weakness.  Rest, take your next dose of the prednisone and the levaquin TOMORROW as you received todays doses here.  Levaquin can increase your coumadin level so you will need to have this level rechecked by your doctor in 1 week -call for an office visit to have this done.

## 2018-05-24 ENCOUNTER — Telehealth: Payer: Self-pay | Admitting: *Deleted

## 2018-05-24 NOTE — Telephone Encounter (Signed)
Pt was d/c home on Levaquin and prednisone 40mg  daily.  She was d/c home on 9/14 and will finish both meds tomorrow 9/17.  Told pt to decrease coumadin to 5mg  daily until Thursday 9/19 then resume previous dose of 5mg  daily except 7.5mg  on S,T,Th.  Keep INR appt.  Pt verbalized understanding.

## 2018-05-24 NOTE — Telephone Encounter (Signed)
Pt was seen in ER on Saturday, was diagnosed w/ Pneumonia. States they changed her meds and wants you to give her a call

## 2018-06-07 ENCOUNTER — Other Ambulatory Visit: Payer: Self-pay | Admitting: Internal Medicine

## 2018-06-07 NOTE — Telephone Encounter (Signed)
CY Please advise on refill. Thanks.  

## 2018-06-08 NOTE — Telephone Encounter (Signed)
Ok to refill 

## 2018-06-09 ENCOUNTER — Ambulatory Visit (INDEPENDENT_AMBULATORY_CARE_PROVIDER_SITE_OTHER): Payer: Medicare PPO | Admitting: *Deleted

## 2018-06-09 DIAGNOSIS — I482 Chronic atrial fibrillation, unspecified: Secondary | ICD-10-CM | POA: Diagnosis not present

## 2018-06-09 DIAGNOSIS — Z5181 Encounter for therapeutic drug level monitoring: Secondary | ICD-10-CM | POA: Diagnosis not present

## 2018-06-09 LAB — POCT INR: INR: 6.9 — AB (ref 2.0–3.0)

## 2018-06-09 NOTE — Patient Instructions (Signed)
Hold coumadin x 3 days (Wednesday, Thursdays and Friday) take 1/2 tablets on Saturday then resume 1 tablet daily except 1 1/2 tablets on Mondays, Wednesdays and Fridays Recheck in 1 week

## 2018-06-16 ENCOUNTER — Ambulatory Visit (INDEPENDENT_AMBULATORY_CARE_PROVIDER_SITE_OTHER): Payer: Medicare PPO | Admitting: *Deleted

## 2018-06-16 DIAGNOSIS — I482 Chronic atrial fibrillation, unspecified: Secondary | ICD-10-CM | POA: Diagnosis not present

## 2018-06-16 DIAGNOSIS — Z5181 Encounter for therapeutic drug level monitoring: Secondary | ICD-10-CM | POA: Diagnosis not present

## 2018-06-16 LAB — POCT INR: INR: 2.2 (ref 2.0–3.0)

## 2018-06-16 NOTE — Patient Instructions (Signed)
Continue coumadin 1 tablet daily except 1 1/2 tablets on Mondays, Wednesdays and Fridays Recheck in 1 week

## 2018-06-17 ENCOUNTER — Ambulatory Visit (INDEPENDENT_AMBULATORY_CARE_PROVIDER_SITE_OTHER)
Admission: RE | Admit: 2018-06-17 | Discharge: 2018-06-17 | Disposition: A | Discharge status: Home / Self Care | Payer: Medicare PPO | Source: Ambulatory Visit | Attending: Internal Medicine | Admitting: Internal Medicine

## 2018-06-17 ENCOUNTER — Encounter: Payer: Self-pay | Admitting: Internal Medicine

## 2018-06-17 ENCOUNTER — Ambulatory Visit: Payer: Medicare PPO | Admitting: Internal Medicine

## 2018-06-17 VITALS — BP 122/68 | HR 76 | Ht 65.53 in | Wt 139.6 lb

## 2018-06-17 DIAGNOSIS — J160 Chlamydial pneumonia: Secondary | ICD-10-CM | POA: Diagnosis not present

## 2018-06-17 DIAGNOSIS — Z23 Encounter for immunization: Secondary | ICD-10-CM | POA: Diagnosis not present

## 2018-06-17 DIAGNOSIS — J189 Pneumonia, unspecified organism: Secondary | ICD-10-CM | POA: Diagnosis not present

## 2018-06-17 DIAGNOSIS — R490 Dysphonia: Secondary | ICD-10-CM

## 2018-06-17 DIAGNOSIS — J449 Chronic obstructive pulmonary disease, unspecified: Secondary | ICD-10-CM

## 2018-06-17 NOTE — Progress Notes (Signed)
Patient ID: [name], female    DOB: 12/03/1933, 84 y.o.   MRN: [id number]  HPI F former smoker, followed for Chronic asthma/ COPD/ Xolair, chronic rhinosinusitis, complicated by hx AFib/ anticoagulation, CHF, cardiomyopathy, mitral insufficiency, GERD, CAD Echocardiogram-atrial fibrillation, LVEF 30-35%, diffuse hypokinesis  --------------------------------------------------------------------------------------------------------- 01/23/17-84 year old female former smoker followed for chronic asthma/COPD, chronic rhinosinusitis, lung nodules complicated by history A. fib/anticoagulation, CHF, cardiomyopathy, mitral insufficiency, GERD, CAD Xolair started out with head congestion and she stated since she started on the prednisone on tuesday, it is all the congestion in her chest.  cough with white sputum. denies any fever, chills, sweats, body aches  Missed Xolair injection this time due to acute bronchitis. Improving gradually. She says her voice quality was getting better before she caught this cold. She never followed up with ENT. CT soft tissues neck 07/30/16- IMPRESSION: 1. Apposition of vocal cords and effacement of the laryngeal ventricles without a discrete exophytic mucosal lesion identified. This may be due to phonation, polyposis, scarring, or posttreatment changes. Given history of laryngeal cancer, direct visualization is recommended. No cervical lymphadenopathy. 2. Aortic atherosclerosis. 3. Mild infectious/inflammatory bronchiolitis.  06/17/2018- -84 year old female former smoker followed for chronic asthma/COPD, chronic rhinosinusitis, lung nodules complicated by history A. fib/anticoagulation, CHF, cardiomyopathy, mitral insufficiency, GERD Barrett's, CAD Xolair ED [facility] 05/22/18-Right lower lobe pneumonia Rx'd levaquin, pred10 mg daily Neb albuterol, albuterol HFA, Xolair She says she feels weak but otherwise okay after taking Levaquin and prednisone for right  lower lobe pneumonia treated at [facility] emergency room September 14.  No longer productive cough.  Appetite good.  No change in chronic hoarseness. She never followed through with ENT concern that she might have a laryngeal cancer and denies any change over at least 2 years ago now. Never biopsied- she didn't wan it done. CXR 05/22/2018 IMPRESSION: 1. Worsening right lower lobe infiltrate/atelectasis with adjacent effusion. 2. Stable cardiomegaly  Review of Systems-see HPI   + = pos Constitutional:    weight loss, night sweats, fevers, chills, +fatigue, lassitude. HEENT:   No-  headaches, difficulty swallowing, tooth/dental problems, sore throat, + hoarseness      No-  sneezing, itching, ear ache, no- nasal congestion, +post nasal drip,  CV:  No- chest pain, no-orthopnea, PND, swelling in lower extremities, anasarca, dizziness, palpitations Resp: +   shortness of breath with exertion or at rest.              No-   productive cough,  non-productive cough,  No- coughing up of blood.              No-   change in color of mucus.  wheezing.   Skin: No-   rash or lesions. GI:  No-   heartburn, indigestion, abdominal pain, nausea, vomiting,  GU: . MS:  No-   joint pain or swelling.  Neuro-     nothing unusual Psych:  No- change in mood or affect. No depression or anxiety.  No memory loss.  Objective:   Physical Exam General- Alert, Oriented, Affect-appropriate/ cheerful, Distress- none acute, thin Skin- no rash visible now on right lateral ribs Lymphadenopathy- none Head- atraumatic            Eyes- Gross vision intact, PERRLA, +conjunctivae injected            Ears- Hearing, canals-normal            Nose- clear, turbinate edema, no-Septal dev, mucus, polyps, erosion, perforation  Throat- Mallampati III , mucosa clear/ not red , drainage- none, tonsils- atrophic,  Dentures. +hoarseness not worse .                     + Large torus hard palate Neck- flexible , trachea midline,  no stridor , thyroid nl, carotid no bruit Chest - symmetrical excursion , unlabored           Heart/CV- +IRR/ AFib  + faint ? Mitral regurg murmur , no gallop  , + HR 132                              no rub, nl s1 s2                           - JVD- none , edema- none, stasis changes- none, varices- none           Lung-  Coarse with few rhonchi, unlabored, wheeze-none, Cough-none , dullness-none, rub- none           Chest wall-  Abd-  Br/ Gen/ Rectal- Not done, not indicated Extrem- cyanosis- none, clubbing, none, atrophy- none, strength- nl Neuro- slight resting tremor

## 2018-06-17 NOTE — Patient Instructions (Signed)
Order- CXR    Dx R lower lobe pneumonia  Order- Flu vax- senior  Order- Pneumovax-23    Please call if we can help

## 2018-06-18 DIAGNOSIS — J189 Pneumonia, unspecified organism: Secondary | ICD-10-CM | POA: Insufficient documentation

## 2018-06-18 NOTE — Assessment & Plan Note (Signed)
She refused to have further evaluation including biopsy and denies any change in hoarseness or swallowing.  Hopefully this is a benign process and not just a very slow-growing neoplasm.

## 2018-06-18 NOTE — Progress Notes (Signed)
Spoke with pt and notified of results per Dr. Young Pt verbalized understanding and denied any questions. 

## 2018-06-18 NOTE — Assessment & Plan Note (Addendum)
05/22/18-clinically has responded to treatment Plan-CXR to verify clinical response

## 2018-06-18 NOTE — Assessment & Plan Note (Signed)
Improved and off after recent pneumonia that we can go ahead while she is here with the flu vaccine and pneumococcal vaccine which we discussed in detail.

## 2018-06-27 ENCOUNTER — Other Ambulatory Visit: Payer: Self-pay | Admitting: Cardiology

## 2018-06-28 ENCOUNTER — Ambulatory Visit (INDEPENDENT_AMBULATORY_CARE_PROVIDER_SITE_OTHER): Payer: Medicare PPO | Admitting: *Deleted

## 2018-06-28 DIAGNOSIS — Z5181 Encounter for therapeutic drug level monitoring: Secondary | ICD-10-CM | POA: Diagnosis not present

## 2018-06-28 DIAGNOSIS — I482 Chronic atrial fibrillation, unspecified: Secondary | ICD-10-CM

## 2018-06-28 LAB — POCT INR: INR: 2.9 (ref 2.0–3.0)

## 2018-06-28 NOTE — Patient Instructions (Signed)
Continue coumadin 1 tablet daily except 1 1/2 tablets on Mondays, Wednesdays and Fridays Recheck in 3 weeks

## 2018-07-06 ENCOUNTER — Ambulatory Visit: Payer: Medicare PPO | Admitting: Cardiology

## 2018-07-15 ENCOUNTER — Telehealth: Payer: Self-pay | Admitting: Internal Medicine

## 2018-07-15 MED ORDER — LEVOFLOXACIN 500 MG PO TABS: 500.0000 mg | ORAL_TABLET | Freq: Every day | ORAL | 0 refills | Status: AC

## 2018-07-15 NOTE — Telephone Encounter (Signed)
Called and spoke to niece [name], informed Leavquin has been sent to pharmacy. Voiced understanding. Nothing further is needed at this time.

## 2018-07-15 NOTE — Telephone Encounter (Signed)
I would like to use a Zpak, but she has hx of unspecified intolerance to Biaxin(clarithromycin), so there might be cross-sensitivity. Does she know what kind of problem she had with Biaxin/

## 2018-07-15 NOTE — Telephone Encounter (Signed)
Offer levaquin 500 mg, # 7, 1 daily 

## 2018-07-15 NOTE — Telephone Encounter (Signed)
Called and spoke to [place], reports the patient has sinus infection and sore throat, states she is dizzy, blowing yellow and green mucous, post nasal drip, denies that she has a fever and this has been going on for 3 days. Denies SOB or chest discomfort. States she was going to bring her in however she is very dizzy and she did not think it would be a good idea to bring her out. She has not given her anything OTC for symptoms. Sore throat is worse today making patient hoarse.   CY please advise.

## 2018-07-15 NOTE — Telephone Encounter (Signed)
Called and spoke to patient, her reaction to Biaxin is a rash and lip swelling. States she has taken zpack in the past and it did not work well. She reports she can take Cipro and Levaquin.   CY please advise. Thanks.

## 2018-07-15 NOTE — Telephone Encounter (Signed)
ATC pt, no answer. Left message for pt to call back.  

## 2018-07-21 ENCOUNTER — Ambulatory Visit (INDEPENDENT_AMBULATORY_CARE_PROVIDER_SITE_OTHER): Payer: Medicare PPO | Admitting: *Deleted

## 2018-07-21 DIAGNOSIS — Z5181 Encounter for therapeutic drug level monitoring: Secondary | ICD-10-CM | POA: Diagnosis not present

## 2018-07-21 DIAGNOSIS — I482 Chronic atrial fibrillation, unspecified: Secondary | ICD-10-CM | POA: Diagnosis not present

## 2018-07-21 LAB — POCT INR: INR: 1.6 — AB (ref 2.0–3.0)

## 2018-07-21 NOTE — Patient Instructions (Signed)
Take coumadin 2 tablets tonight, 1 1/2 tablets tomorrow night then resume 1 tablet daily except 1 1/2 tablets on Mondays, Wednesdays and Fridays Recheck in 2 weeks

## 2018-07-22 ENCOUNTER — Telehealth: Payer: Self-pay | Admitting: Internal Medicine

## 2018-07-22 MED ORDER — CETIRIZINE HCL 10 MG PO CAPS: 1.0000 | ORAL_CAPSULE | Freq: Every day | ORAL | 5 refills | Status: DC

## 2018-07-22 NOTE — Telephone Encounter (Signed)
Spoke with the pt  She is asking for rx for cetirizine to be called in  I do not see that we ever rec this before and it's not on her current med list  CDY- please advise if you are okay with [place] sending this in, thanks  Allergies  Allergen Reactions  . Amoxicillin-Pot Clavulanate   . Clarithromycin   . Clindamycin   . Doxycycline   . Penicillins    Current Outpatient Medications on File Prior to Visit  Medication Sig Dispense Refill  . albuterol (PROVENTIL HFA;VENTOLIN HFA) 108 (90 BASE) MCG/ACT inhaler Inhale 2 puffs into the lungs every 6 (six) hours as needed for wheezing or shortness of breath. 1 Inhaler 11  . albuterol (PROVENTIL) (2.5 MG/3ML) 0.083% nebulizer solution INHALE THE CONTENTS OF 1 VIAL USING NEBULIZER EVERY 4 HOURS AS NEEDED FOR WHEEZING OR SHORTNESS OF BREATH 90 mL 12  . atorvastatin (LIPITOR) 10 MG tablet Take 10 mg by mouth daily.      . citalopram (CELEXA) 40 MG tablet Take 1 tablet by mouth daily.    . diazepam (VALIUM) 5 MG tablet TAKE 1 TABLET BY MOUTH EVERY 12 HOURS AS NEEDED 30 tablet 5  . digoxin (LANOXIN) 0.125 MG tablet Take 1 tablet (125 mcg total) by mouth daily. 90 tablet 3  . diltiazem (CARDIZEM CD) 240 MG 24 hr capsule Take 1 capsule (240 mg total) by mouth daily. 90 capsule 3  . esomeprazole (NEXIUM) 40 MG capsule Take 40 mg by mouth 2 (two) times daily.      . furosemide (LASIX) 40 MG tablet Take 1 tablet (40 mg total) by mouth daily. 90 tablet 3  . levofloxacin (LEVAQUIN) 500 MG tablet Take 1 tablet (500 mg total) by mouth daily. 6 tablet 0  . levofloxacin (LEVAQUIN) 500 MG tablet Take 1 tablet (500 mg total) by mouth daily for 7 days. 7 tablet 0  . potassium chloride SA (KLOR-CON M20) 20 MEQ tablet Take 1 tablet (20 mEq total) by mouth daily. 30 tablet 6  . traMADol (ULTRAM) 50 MG tablet TAKE 1 TABLET BY MOUTH EVERY 4 HOURS AS NEEDED 42 tablet 0  . warfarin (COUMADIN) 5 MG tablet Take 1 tablet daily except 1 1/2 tablets on Mondays, Wednesdays and  Fridays 40 tablet 3   Current Facility-Administered Medications on File Prior to Visit  Medication Dose Route Frequency Provider Last Rate Last Dose  . omalizumab Arvid Right) injection 300 mg  300 mg Subcutaneous Q14 Days [name] D, MD   300 mg at 08/04/16 1221

## 2018-07-22 NOTE — Telephone Encounter (Signed)
Ok cetirizine 10 mg, # 30, 1 daily as needed- antihistamine    Ref x 5

## 2018-07-22 NOTE — Telephone Encounter (Signed)
Called and spoke with patient, she is aware and verbalized understanding. Prescription sent in. Nothing further needed.

## 2018-07-29 ENCOUNTER — Telehealth: Payer: Self-pay | Admitting: Internal Medicine

## 2018-07-29 NOTE — Telephone Encounter (Signed)
Attempted to call pt but unable to reach her. Left message for pt to return call. 

## 2018-07-30 MED ORDER — TRAMADOL HCL 50 MG PO TABS: 50.0000 mg | ORAL_TABLET | ORAL | 0 refills | Status: DC | PRN

## 2018-07-30 NOTE — Telephone Encounter (Signed)
Ok to refill 

## 2018-07-30 NOTE — Telephone Encounter (Signed)
Called and spoke with pt who states that she needs a refill of her Tramadol 50mg  tablet. Pt last seen at office 06/17/18, med last refilled for pt 06/08/18 #42 tablets with 0 RF.  Dr. Annamaria Boots, please advise if it is okay for [place] to refill med for pt.  Allergies  Allergen Reactions  . Amoxicillin-Pot Clavulanate   . Clarithromycin   . Clindamycin   . Doxycycline   . Penicillins      Current Outpatient Medications:  .  albuterol (PROVENTIL HFA;VENTOLIN HFA) 108 (90 BASE) MCG/ACT inhaler, Inhale 2 puffs into the lungs every 6 (six) hours as needed for wheezing or shortness of breath., Disp: 1 Inhaler, Rfl: 11 .  albuterol (PROVENTIL) (2.5 MG/3ML) 0.083% nebulizer solution, INHALE THE CONTENTS OF 1 VIAL USING NEBULIZER EVERY 4 HOURS AS NEEDED FOR WHEEZING OR SHORTNESS OF BREATH, Disp: 90 mL, Rfl: 12 .  atorvastatin (LIPITOR) 10 MG tablet, Take 10 mg by mouth daily.  , Disp: , Rfl:  .  Cetirizine HCl 10 MG CAPS, Take 1 capsule (10 mg total) by mouth daily., Disp: 30 capsule, Rfl: 5 .  citalopram (CELEXA) 40 MG tablet, Take 1 tablet by mouth daily., Disp: , Rfl:  .  diazepam (VALIUM) 5 MG tablet, TAKE 1 TABLET BY MOUTH EVERY 12 HOURS AS NEEDED, Disp: 30 tablet, Rfl: 5 .  digoxin (LANOXIN) 0.125 MG tablet, Take 1 tablet (125 mcg total) by mouth daily., Disp: 90 tablet, Rfl: 3 .  diltiazem (CARDIZEM CD) 240 MG 24 hr capsule, Take 1 capsule (240 mg total) by mouth daily., Disp: 90 capsule, Rfl: 3 .  esomeprazole (NEXIUM) 40 MG capsule, Take 40 mg by mouth 2 (two) times daily.  , Disp: , Rfl:  .  furosemide (LASIX) 40 MG tablet, Take 1 tablet (40 mg total) by mouth daily., Disp: 90 tablet, Rfl: 3 .  levofloxacin (LEVAQUIN) 500 MG tablet, Take 1 tablet (500 mg total) by mouth daily., Disp: 6 tablet, Rfl: 0 .  potassium chloride SA (KLOR-CON M20) 20 MEQ tablet, Take 1 tablet (20 mEq total) by mouth daily., Disp: 30 tablet, Rfl: 6 .  traMADol (ULTRAM) 50 MG tablet, TAKE 1 TABLET BY MOUTH EVERY 4 HOURS AS  NEEDED, Disp: 42 tablet, Rfl: 0 .  warfarin (COUMADIN) 5 MG tablet, Take 1 tablet daily except 1 1/2 tablets on Mondays, Wednesdays and Fridays, Disp: 40 tablet, Rfl: 3  Current Facility-Administered Medications:  .  omalizumab (XOLAIR) injection 300 mg, 300 mg, Subcutaneous, Q14 Days, Young, Clinton D, MD, 300 mg at 08/04/16 1221

## 2018-07-30 NOTE — Telephone Encounter (Signed)
Called and spoke with pt letting her know that CY had said we could refill her Tramadol Rx for her. Pt expressed understanding. I verified the pharmacy that the Rx needed to be called in to.  Called pt's pharmacy and spoke with Cheri Rous from the pharmacy giving a verbal for pt's Rx. Nothing further needed.

## 2018-08-04 ENCOUNTER — Ambulatory Visit (INDEPENDENT_AMBULATORY_CARE_PROVIDER_SITE_OTHER): Payer: Medicare PPO | Admitting: *Deleted

## 2018-08-04 DIAGNOSIS — Z5181 Encounter for therapeutic drug level monitoring: Secondary | ICD-10-CM | POA: Diagnosis not present

## 2018-08-04 DIAGNOSIS — I482 Chronic atrial fibrillation, unspecified: Secondary | ICD-10-CM | POA: Diagnosis not present

## 2018-08-04 LAB — POCT INR: INR: 1.8 — AB (ref 2.0–3.0)

## 2018-08-04 NOTE — Patient Instructions (Signed)
Increase coumadin to 1 1/2 tablets daily except 1 tablet on Tuesdays and Fridays Recheck in 2 weeks

## 2018-08-13 ENCOUNTER — Other Ambulatory Visit: Payer: Self-pay | Admitting: Cardiology

## 2018-08-17 NOTE — Progress Notes (Signed)
Cardiology Office Note  Date: 08/18/2018   ID: [name], DOB 05/02/1934, MRN [id number]  PCP: Sharilyn Sites, MD  Primary Cardiologist: [name], MD   Chief Complaint  Patient presents with  . Atrial Fibrillation    History of Present Illness: [name] is an 84 y.o. female last seen in October 2018.  She presents for a routine follow-up visit.  States that she has had no palpitations or chest pain, remains chronically short of breath with COPD.  She had an episode of bronchitis in October, states that she was treated with antibiotics at home.  She continues on Coumadin with follow-up in the anticoagulation clinic.  She denies any bleeding episodes.  I reviewed her medications.  Heart rate control is adequate on combination of Cardizem CD 240 mg daily and Lanoxin 0.125 mg daily.  I reviewed her ECG from September.  Echocardiogram from March 2017 showed LVEF 55 to 60% range, aortic valve sclerosis, moderate mitral stenosis with mild to moderate regurgitation, severely dilated left atrium.  Past Medical History:  Diagnosis Date  . Asthma   . Atrial fibrillation ([place])   . Chronic rhinitis   . COPD (chronic obstructive pulmonary disease) ([place])   . GERD (gastroesophageal reflux disease)   . Mitral regurgitation   . Mitral valve prolapse   . Nasal polyps   . Nonischemic cardiomyopathy ([facility])    LVEF 25-30% improved to 45-50%  . Obstructive sleep apnea   . Transudative pleural effusion     Past Surgical History:  Procedure Laterality Date  . APPENDECTOMY  2000  . Bilateral cataract surgery    . LAPAROSCOPIC NISSEN FUNDOPLICATION  7829  . NOSE SURGERY      Current Outpatient Medications  Medication Sig Dispense Refill  . albuterol (PROVENTIL HFA;VENTOLIN HFA) 108 (90 BASE) MCG/ACT inhaler Inhale 2 puffs into the lungs every 6 (six) hours as needed for wheezing or shortness of breath. 1 Inhaler 11  . albuterol (PROVENTIL) (2.5 MG/3ML) 0.083% nebulizer  solution INHALE THE CONTENTS OF 1 VIAL USING NEBULIZER EVERY 4 HOURS AS NEEDED FOR WHEEZING OR SHORTNESS OF BREATH 90 mL 12  . atorvastatin (LIPITOR) 10 MG tablet Take 10 mg by mouth daily.      . Cetirizine HCl 10 MG CAPS Take 1 capsule (10 mg total) by mouth daily. 30 capsule 5  . citalopram (CELEXA) 40 MG tablet Take 1 tablet by mouth daily.    . diazepam (VALIUM) 5 MG tablet TAKE 1 TABLET BY MOUTH EVERY 12 HOURS AS NEEDED 30 tablet 5  . digoxin (LANOXIN) 0.125 MG tablet TAKE 1 TABLET BY MOUTH EVERY DAY 30 tablet 6  . diltiazem (CARDIZEM CD) 240 MG 24 hr capsule Take 1 capsule (240 mg total) by mouth daily. 90 capsule 3  . esomeprazole (NEXIUM) 40 MG capsule Take 40 mg by mouth 2 (two) times daily.      . furosemide (LASIX) 40 MG tablet Take 1 tablet (40 mg total) by mouth daily. 90 tablet 3  . levofloxacin (LEVAQUIN) 500 MG tablet Take 1 tablet (500 mg total) by mouth daily. 6 tablet 0  . potassium chloride SA (KLOR-CON M20) 20 MEQ tablet Take 1 tablet (20 mEq total) by mouth daily. 30 tablet 6  . traMADol (ULTRAM) 50 MG tablet Take 1 tablet (50 mg total) by mouth every 4 (four) hours as needed. 42 tablet 0  . warfarin (COUMADIN) 5 MG tablet Take 1 tablet daily except 1 1/2 tablets on Mondays, Wednesdays  and Fridays 40 tablet 3   Current Facility-Administered Medications  Medication Dose Route Frequency Provider Last Rate Last Dose  . omalizumab Arvid Right) injection 300 mg  300 mg Subcutaneous Q14 Days [name] D, MD   300 mg at 08/04/16 1221   Allergies:  Amoxicillin-pot clavulanate; Clarithromycin; Clindamycin; Doxycycline; and Penicillins   Social History: The patient  reports that she quit smoking about 37 years ago. Her smoking use included cigarettes. She has a 10.00 pack-year smoking history. She has never used smokeless tobacco. She reports that she does not drink alcohol or use drugs.   ROS:  Please see the history of present illness. Otherwise, complete review of systems is  positive for hearing loss.  All other systems are reviewed and negative.   Physical Exam: VS:  BP 120/70 (BP Location: Right Arm)   Pulse 78   Ht 5\' 6"  (1.676 m)   Wt 148 lb (67.1 kg)   SpO2 97%   BMI 23.89 kg/m , BMI Body mass index is 23.89 kg/m.  Wt Readings from Last 3 Encounters:  08/18/18 148 lb (67.1 kg)  06/17/18 139 lb 9.6 oz (63.3 kg)  05/22/18 142 lb (64.4 kg)    General: Elderly woman, appears comfortable at rest. HEENT: Conjunctiva and lids normal, oropharynx clear. Neck: Supple, no elevated JVP or carotid bruits, no thyromegaly. Lungs: Decreased breath sounds without wheezing. Cardiac: Irregularly irregular, no S3, soft apical systolic murmur, no pericardial rub. Abdomen: Soft, nontender, bowel sounds present. Extremities: No pitting edema, distal pulses 2+. Skin: Warm and dry. Musculoskeletal: Mild kyphosis. Neuropsychiatric: Alert and oriented x3, affect grossly appropriate.  ECG: I personally reviewed the tracing from 05/22/2018 which showed rate controlled atrial fibrillation with low voltage.  Recent Labwork: 05/22/2018: BUN 30; Creatinine, Ser 1.23; Hemoglobin 12.4; Platelets 172; Potassium 4.3; Sodium 135   Other Studies Reviewed Today:  Echocardiogram 11/12/2015: Study Conclusions  - Left ventricle: The cavity size was normal. Wall thickness was   increased in a pattern of mild LVH. Systolic function was normal.   The estimated ejection fraction was in the range of 55% to 60%.   Wall motion was normal; there were no regional wall motion   abnormalities. - Aortic valve: Mildly calcified annulus. Trileaflet; mildly   thickened leaflets. Valve area (VTI): 1.89 cm^2. Valve area   (Vmax): 1.89 cm^2. - Mitral valve: Moderately calcified annulus. Mildly thickened   leaflets . The findings are consistent with moderate stenosis.   There was mild to moderate regurgitation. Mean gradient (D): 7 mm   Hg. Peak gradient (D): 19 mm Hg. In setting of afib unable  to   accurately calculate MV area by PHT or continuity equation. By   gradient there is moderate mitral stenosis. - Left atrium: The atrium was severely dilated. - Right atrium: The atrium was mildly dilated.  Assessment and Plan:  1.  Permanent atrial fibrillation.  Heart rate control is adequate on current doses of Cardizem CD and Lanoxin.  Continue Coumadin, due for PT/INR today.  2.  Mitral valve disease with both mitral stenosis and mitral regurgitation, last assessed in 2017.  Follow-up echocardiogram will be obtained.  Anticipate conservative management.  3.  History of tachycardia-mediated cardiomyopathy with normalization of LVEF, 55 to 60% range in 2017.  4.  Intermittent leg edema with stable control on Lasix.  Current medicines were reviewed with the patient today.   Orders Placed This Encounter  Procedures  . ECHOCARDIOGRAM COMPLETE    Disposition: Follow-up in 1 year.  Signed, Satira Sark, MD, [facility] 08/18/2018 11:16 AM    [facility] [place]. [address] [place], [place] [address] Phone: [phone]; Fax: [phone]

## 2018-08-18 ENCOUNTER — Encounter: Payer: Self-pay | Admitting: Cardiology

## 2018-08-18 ENCOUNTER — Ambulatory Visit (INDEPENDENT_AMBULATORY_CARE_PROVIDER_SITE_OTHER): Payer: Medicare PPO | Admitting: *Deleted

## 2018-08-18 ENCOUNTER — Ambulatory Visit: Payer: Medicare PPO | Admitting: Cardiology

## 2018-08-18 VITALS — BP 120/70 | HR 78 | Ht 66.0 in | Wt 148.0 lb

## 2018-08-18 DIAGNOSIS — R600 Localized edema: Secondary | ICD-10-CM | POA: Diagnosis not present

## 2018-08-18 DIAGNOSIS — Z5181 Encounter for therapeutic drug level monitoring: Secondary | ICD-10-CM | POA: Diagnosis not present

## 2018-08-18 DIAGNOSIS — I059 Rheumatic mitral valve disease, unspecified: Secondary | ICD-10-CM

## 2018-08-18 DIAGNOSIS — I4821 Permanent atrial fibrillation: Secondary | ICD-10-CM

## 2018-08-18 DIAGNOSIS — I482 Chronic atrial fibrillation, unspecified: Secondary | ICD-10-CM | POA: Diagnosis not present

## 2018-08-18 DIAGNOSIS — Z8679 Personal history of other diseases of the circulatory system: Secondary | ICD-10-CM

## 2018-08-18 LAB — POCT INR: INR: 3.8 — AB (ref 2.0–3.0)

## 2018-08-18 NOTE — Patient Instructions (Signed)
Decrease coumadin to 1 1/2 tablets daily except 1 tablet on Sundays, Tuesdays and Thursdays Recheck in 3 weeks

## 2018-08-18 NOTE — Patient Instructions (Signed)
Medication Instructions:  Your physician recommends that you continue on your current medications as directed. Please refer to the Current Medication list given to you today.   Labwork: NONE  Testing/Procedures: Your physician has requested that you have an echocardiogram. Echocardiography is a painless test that uses sound waves to create images of your heart. It provides your doctor with information about the size and shape of your heart and how well your heart's chambers and valves are working. This procedure takes approximately one hour. There are no restrictions for this procedure.    Follow-Up: Your physician wants you to follow-up in: 1 YEAR.  You will receive a reminder letter in the mail two months in advance. If you don't receive a letter, please call our office to schedule the follow-up appointment.   Any Other Special Instructions Will Be Listed Below (If Applicable).     If you need a refill on your cardiac medications before your next appointment, please call your pharmacy.   

## 2018-09-10 ENCOUNTER — Telehealth: Payer: Self-pay | Admitting: Internal Medicine

## 2018-09-10 MED ORDER — TRAMADOL HCL 50 MG PO TABS: 50.0000 mg | ORAL_TABLET | ORAL | 2 refills | Status: DC | PRN

## 2018-09-10 NOTE — Telephone Encounter (Signed)
Pt is requesting a refill on Tramadol 50mg . This was last refilled for her on 07/30/18 #42. Her last OV with CY was on 06/17/18.  CY - please advise. Thanks.

## 2018-09-10 NOTE — Telephone Encounter (Signed)
Refill e-sent to her drugstore

## 2018-09-10 NOTE — Telephone Encounter (Signed)
Spoke with pt. She is aware that CY has refilled her medication. Nothing further was needed at this time.

## 2018-09-13 ENCOUNTER — Ambulatory Visit (INDEPENDENT_AMBULATORY_CARE_PROVIDER_SITE_OTHER): Payer: Medicare PPO | Admitting: Pharmacist

## 2018-09-13 DIAGNOSIS — Z5181 Encounter for therapeutic drug level monitoring: Secondary | ICD-10-CM | POA: Diagnosis not present

## 2018-09-13 DIAGNOSIS — I482 Chronic atrial fibrillation, unspecified: Secondary | ICD-10-CM | POA: Diagnosis not present

## 2018-09-13 LAB — POCT INR: INR: 1.1 — AB (ref 2.0–3.0)

## 2018-09-13 NOTE — Patient Instructions (Signed)
Description   Take 2 tablets today and 1 1/2 tablets tomorrow then continue 1 1/2 tablets daily except 1 tablet on Sundays, Tuesdays and Thursdays Recheck in 1 week with Echo.

## 2018-09-14 ENCOUNTER — Other Ambulatory Visit (HOSPITAL_COMMUNITY): Payer: Medicare PPO

## 2018-09-16 DIAGNOSIS — Z1389 Encounter for screening for other disorder: Secondary | ICD-10-CM | POA: Diagnosis not present

## 2018-09-16 DIAGNOSIS — K5900 Constipation, unspecified: Secondary | ICD-10-CM | POA: Diagnosis not present

## 2018-09-16 DIAGNOSIS — Z6823 Body mass index (BMI) 23.0-23.9, adult: Secondary | ICD-10-CM | POA: Diagnosis not present

## 2018-09-20 ENCOUNTER — Ambulatory Visit (INDEPENDENT_AMBULATORY_CARE_PROVIDER_SITE_OTHER): Payer: Medicare PPO | Admitting: Pharmacist

## 2018-09-20 ENCOUNTER — Ambulatory Visit (HOSPITAL_COMMUNITY): Payer: Medicare PPO | Attending: Cardiology

## 2018-09-20 DIAGNOSIS — Z5181 Encounter for therapeutic drug level monitoring: Secondary | ICD-10-CM | POA: Diagnosis not present

## 2018-09-20 DIAGNOSIS — I482 Chronic atrial fibrillation, unspecified: Secondary | ICD-10-CM

## 2018-09-20 LAB — POCT INR: INR: 3.1 — AB (ref 2.0–3.0)

## 2018-09-20 NOTE — Patient Instructions (Signed)
Description   Take 1 tablet today then continue 1 1/2 tablets daily except 1 tablet on Sundays, Tuesdays and Thursdays Recheck in 2 weeks

## 2018-09-25 ENCOUNTER — Other Ambulatory Visit: Payer: Self-pay | Admitting: Internal Medicine

## 2018-09-27 NOTE — Telephone Encounter (Signed)
Dr. Annamaria Boots, please advise if it is okay to refill med for pt. Pt last seen 06/17/18 and med was last filled for pt 09/07/17.    If you are okay with the refill, can you electronically send this to pt's pharmacy. [facility]acy [facility] [place] [address] at [facility].

## 2018-09-27 NOTE — Telephone Encounter (Signed)
Valium refill e-sent

## 2018-10-01 ENCOUNTER — Other Ambulatory Visit: Payer: Self-pay | Admitting: Cardiology

## 2018-10-04 ENCOUNTER — Ambulatory Visit (INDEPENDENT_AMBULATORY_CARE_PROVIDER_SITE_OTHER): Payer: Medicare PPO | Admitting: Pharmacist

## 2018-10-04 DIAGNOSIS — I482 Chronic atrial fibrillation, unspecified: Secondary | ICD-10-CM

## 2018-10-04 DIAGNOSIS — Z5181 Encounter for therapeutic drug level monitoring: Secondary | ICD-10-CM

## 2018-10-04 LAB — POCT INR: INR: 2.3 (ref 2.0–3.0)

## 2018-10-04 NOTE — Patient Instructions (Signed)
Description   Continue 1 1/2 tablets daily except 1 tablet on Sundays, Tuesdays and Thursdays Recheck in 3 weeks

## 2018-10-06 DIAGNOSIS — K6289 Other specified diseases of anus and rectum: Secondary | ICD-10-CM | POA: Diagnosis not present

## 2018-10-06 DIAGNOSIS — Z0001 Encounter for general adult medical examination with abnormal findings: Secondary | ICD-10-CM | POA: Diagnosis not present

## 2018-10-06 DIAGNOSIS — Z6823 Body mass index (BMI) 23.0-23.9, adult: Secondary | ICD-10-CM | POA: Diagnosis not present

## 2018-10-06 DIAGNOSIS — Z1389 Encounter for screening for other disorder: Secondary | ICD-10-CM | POA: Diagnosis not present

## 2018-10-14 ENCOUNTER — Other Ambulatory Visit: Payer: Self-pay | Admitting: Cardiology

## 2018-11-10 ENCOUNTER — Ambulatory Visit (INDEPENDENT_AMBULATORY_CARE_PROVIDER_SITE_OTHER): Payer: Medicare PPO | Admitting: *Deleted

## 2018-11-10 DIAGNOSIS — Z5181 Encounter for therapeutic drug level monitoring: Secondary | ICD-10-CM

## 2018-11-10 DIAGNOSIS — I482 Chronic atrial fibrillation, unspecified: Secondary | ICD-10-CM

## 2018-11-10 LAB — POCT INR: INR: 2.4 (ref 2.0–3.0)

## 2018-11-10 NOTE — Patient Instructions (Signed)
Continue 1 1/2 tablets daily except 1 tablet on Sundays, Tuesdays and Thursdays Recheck in 4 weeks

## 2018-11-18 ENCOUNTER — Ambulatory Visit (INDEPENDENT_AMBULATORY_CARE_PROVIDER_SITE_OTHER): Payer: Medicare PPO | Admitting: Internal Medicine

## 2018-11-22 ENCOUNTER — Encounter (INDEPENDENT_AMBULATORY_CARE_PROVIDER_SITE_OTHER): Payer: Self-pay | Admitting: Internal Medicine

## 2018-11-22 ENCOUNTER — Other Ambulatory Visit: Payer: Self-pay

## 2018-11-22 ENCOUNTER — Ambulatory Visit (INDEPENDENT_AMBULATORY_CARE_PROVIDER_SITE_OTHER): Payer: Medicare PPO | Admitting: Internal Medicine

## 2018-11-22 VITALS — BP 117/59 | HR 46 | Temp 98.3°F | Ht 66.0 in | Wt 148.3 lb

## 2018-11-22 DIAGNOSIS — K6289 Other specified diseases of anus and rectum: Secondary | ICD-10-CM

## 2018-11-22 DIAGNOSIS — K640 First degree hemorrhoids: Secondary | ICD-10-CM | POA: Diagnosis not present

## 2018-11-22 MED ORDER — PRAMOXINE HCL 1 % RE FOAM: 1.0000 "application " | Freq: Three times a day (TID) | RECTAL | 0 refills | Status: DC | PRN

## 2018-11-22 NOTE — Progress Notes (Signed)
   Subjective:    Patient ID: [name], female    DOB: [birth year], 85 y.o.   MRN: [id number]  HPI Referred by Dr. Hilma Favors for rectal pain. She says she has been constipated all her life. She says she has hemorrhoids. Two months ago, she was having a hard time going to the bathroom. She says she has some rectal irritation. Saw Dr. Hilma Favors and was given stool softener, Anusol rectal supp. And Anusol topical cream.  She says the medication helped for a little bit. She says she still has some irritation. No blood. He stools are soft now. She has a BM daily.  She says she feels 90% better.  No rectal bleeding.  Appetite is good. No weight loss.  She declines colonoscopy today.    Her last colonoscopy was in January of 2012 by Dr. Ardis Hughs. (family hx of colon cancer, both parents). Mild diverticulosis in the sigmoid to descending colon segments. Internal and external hemorrhoids. Other wise normal exam.   EGD 10/02/2010 Dr. Ardis Hughs. Intermittent dysphagia, anemia.  Moderate gastritis. ? Portal gastropathy. Post fundoplication appearance of GE junction without significant luminal narrowing. (GE junction 17-18 mm). Otherwise normal exam. No evidence of H. Pylori.    AFIB and CHF and maintained on Warfarin.  Review of Systems     Objective:   Physical Exam Blood pressure (!) 117/59, pulse (!) 46, temperature 98.3 F (36.8 C), height 5\' 6"  (1.676 m), weight 148 lb 4.8 oz (67.3 kg). Alert and oriented. Skin warm and dry. Oral mucosa is moist.   . Sclera anicteric, conjunctivae is pink. Thyroid not enlarged. No cervical lymphadenopathy. Lungs clear. Heart regular rate and rhythm.  Abdomen is soft. Bowel sounds are positive. No hepatomegaly. No abdominal masses felt. No tenderness.  No edema to lower extremities.  Rectal: no masses. Guaiac negative. External hemorrhoid noted. Tender to the touch.          Assessment & Plan:  Rectal pain, Hemorrhoids. Rx for Proctofoam sent to her pharmacy. She  will let me know how she is doing. PR in 2 weeks.  (She declined colonoscopy).

## 2018-11-22 NOTE — Patient Instructions (Signed)
Rx for Proctofoam sent to her pharmacy. PR in 2 weeks.

## 2018-12-02 ENCOUNTER — Other Ambulatory Visit: Payer: Self-pay | Admitting: Internal Medicine

## 2018-12-02 NOTE — Telephone Encounter (Signed)
CY please advise if ok to refill.   Current Outpatient Medications on File Prior to Visit  Medication Sig Dispense Refill  . albuterol (PROVENTIL HFA;VENTOLIN HFA) 108 (90 BASE) MCG/ACT inhaler Inhale 2 puffs into the lungs every 6 (six) hours as needed for wheezing or shortness of breath. 1 Inhaler 11  . albuterol (PROVENTIL) (2.5 MG/3ML) 0.083% nebulizer solution INHALE THE CONTENTS OF 1 VIAL USING NEBULIZER EVERY 4 HOURS AS NEEDED FOR WHEEZING OR SHORTNESS OF BREATH 90 mL 12  . atorvastatin (LIPITOR) 10 MG tablet Take 10 mg by mouth daily.      . Cetirizine HCl 10 MG CAPS Take 1 capsule (10 mg total) by mouth daily. 30 capsule 5  . citalopram (CELEXA) 40 MG tablet Take 1 tablet by mouth daily.    . diazepam (VALIUM) 5 MG tablet TAKE 1 TABLET BY MOUTH EVERY 12 HOURS AS NEEDED 30 tablet 5  . digoxin (LANOXIN) 0.125 MG tablet TAKE 1 TABLET BY MOUTH EVERY DAY 30 tablet 6  . diltiazem (CARDIZEM CD) 240 MG 24 hr capsule TAKE 1 CAPSULE BY MOUTH EVERY DAY 90 capsule 3  . docusate sodium (COLACE) 100 MG capsule Take 100 mg by mouth 2 (two) times daily.    Marland Kitchen esomeprazole (NEXIUM) 40 MG capsule Take 40 mg by mouth 2 (two) times daily.      . furosemide (LASIX) 40 MG tablet Take 1 tablet (40 mg total) by mouth daily. 90 tablet 3  . potassium chloride SA (KLOR-CON M20) 20 MEQ tablet Take 1 tablet (20 mEq total) by mouth daily. 30 tablet 6  . pramoxine (PROCTOFOAM) 1 % foam Place 1 application rectally 3 (three) times daily as needed for anal itching. 15 g 0  . traMADol (ULTRAM) 50 MG tablet Take 1 tablet (50 mg total) by mouth every 4 (four) hours as needed. 42 tablet 2  . warfarin (COUMADIN) 5 MG tablet Take as directed by Coumadin Clinic (Patient taking differently: Take as directed by Coumadin Clinic Take 1 tabs daily except 1 1/2 tabs on Mondays, Wednesdays and Fridays.) 120 tablet 1   Current Facility-Administered Medications on File Prior to Visit  Medication Dose Route Frequency Provider Last Rate  Last Dose  . omalizumab Arvid Right) injection 300 mg  300 mg Subcutaneous Q14 Days [name] D, MD   300 mg at 08/04/16 1221   Allergies  Allergen Reactions  . Amoxicillin-Pot Clavulanate   . Clarithromycin   . Clindamycin   . Doxycycline   . Penicillins

## 2018-12-02 NOTE — Telephone Encounter (Signed)
Tramadol refill e-sent

## 2018-12-07 ENCOUNTER — Telehealth: Payer: Self-pay | Admitting: *Deleted

## 2018-12-07 NOTE — Telephone Encounter (Signed)
COVID-19 Pre-Screening Questions:  . Do you currently have a fever?NO (yes = cancel and refer to pcp for e-visit) . Have you recently travelled on a cruise, internationally, or to [place], [place], [place], [place], [place], or [place], [place] ([place]) ? NO (yes = cancel, stay home, monitor symptoms, and contact pcp or initiate e-visit if symptoms develop) . Have you been in contact with someone that is currently pending confirmation of Covid19 testing or has been confirmed to have the Covid19 virus?  NO (yes = cancel, stay home, away from tested individual, monitor symptoms, and contact pcp or initiate e-visit if symptoms develop) . Are you currently experiencing fatigue or cough? NO (yes = pt should be prepared to have a mask placed at the time of their visit).      

## 2018-12-08 ENCOUNTER — Ambulatory Visit (INDEPENDENT_AMBULATORY_CARE_PROVIDER_SITE_OTHER): Payer: Medicare PPO | Admitting: *Deleted

## 2018-12-08 ENCOUNTER — Other Ambulatory Visit: Payer: Self-pay

## 2018-12-08 DIAGNOSIS — I482 Chronic atrial fibrillation, unspecified: Secondary | ICD-10-CM | POA: Diagnosis not present

## 2018-12-08 DIAGNOSIS — Z5181 Encounter for therapeutic drug level monitoring: Secondary | ICD-10-CM | POA: Diagnosis not present

## 2018-12-08 LAB — POCT INR: INR: 1.6 — AB (ref 2.0–3.0)

## 2018-12-08 NOTE — Patient Instructions (Signed)
Take 2 tablets tonight, 1 1/2 tablets tomorrow night then resume 1 1/2 tablets daily except 1 tablet on Sundays, Tuesdays and Thursdays Recheck in 4 weeks

## 2018-12-20 ENCOUNTER — Other Ambulatory Visit: Payer: Self-pay | Admitting: Cardiology

## 2019-01-04 ENCOUNTER — Telehealth: Payer: Self-pay | Admitting: *Deleted

## 2019-01-04 ENCOUNTER — Ambulatory Visit: Payer: Medicare PPO | Admitting: Internal Medicine

## 2019-01-04 NOTE — Telephone Encounter (Signed)

## 2019-01-05 ENCOUNTER — Ambulatory Visit (INDEPENDENT_AMBULATORY_CARE_PROVIDER_SITE_OTHER): Payer: Medicare PPO | Admitting: Pharmacist Clinician (PhC)/ Clinical Pharmacy Specialist

## 2019-01-05 DIAGNOSIS — I4821 Permanent atrial fibrillation: Secondary | ICD-10-CM

## 2019-01-05 DIAGNOSIS — I482 Chronic atrial fibrillation, unspecified: Secondary | ICD-10-CM

## 2019-01-05 DIAGNOSIS — Z5181 Encounter for therapeutic drug level monitoring: Secondary | ICD-10-CM

## 2019-01-05 LAB — POCT INR: INR: 2.8 (ref 2.0–3.0)

## 2019-01-05 NOTE — Patient Instructions (Signed)
Please continue dosage of 1 1/2 tablets daily except 1 tablet on Sundays, Tuesdays and Thursdays Recheck in 6 weeks

## 2019-01-07 ENCOUNTER — Encounter: Payer: Self-pay | Admitting: Internal Medicine

## 2019-01-07 ENCOUNTER — Other Ambulatory Visit: Payer: Self-pay

## 2019-01-07 ENCOUNTER — Ambulatory Visit: Payer: Medicare PPO | Admitting: Internal Medicine

## 2019-01-07 VITALS — BP 114/70 | HR 95 | Ht 66.0 in | Wt 148.4 lb

## 2019-01-07 DIAGNOSIS — J449 Chronic obstructive pulmonary disease, unspecified: Secondary | ICD-10-CM | POA: Diagnosis not present

## 2019-01-07 DIAGNOSIS — J301 Allergic rhinitis due to pollen: Secondary | ICD-10-CM | POA: Diagnosis not present

## 2019-01-07 NOTE — Patient Instructions (Signed)
Order- [facility] please replace mouth piece and tubing parts for nebulizer machine   Dx asthma/ COPD overlap  Suggest you change from Zyrtec to Claritin (loratadine) for allergy with less sleepiness.  You can also use Flonase 2 puffs each nostril every night at bedtime.  Please call if we can help

## 2019-01-07 NOTE — Progress Notes (Signed)
Patient ID: [name], female    DOB: 1934-07-20, 85 y.o.   MRN: [id number]  HPI F former smoker, followed for Chronic asthma/ COPD/ Xolair, chronic rhinosinusitis, complicated by hx AFib/ anticoagulation, CHF, cardiomyopathy, mitral insufficiency, GERD, CAD Echocardiogram-atrial fibrillation, LVEF 30-35%, diffuse hypokinesis  --------------------------------------------------------------------------------------------------------- 06/17/2018- -85 year old female former smoker followed for chronic asthma/COPD, chronic rhinosinusitis, lung nodules complicated by history A. fib/anticoagulation, CHF, cardiomyopathy, mitral insufficiency, GERD Barrett's, CAD Xolair ED [facility] 05/22/18-Right lower lobe pneumonia Rx'd levaquin, pred10 mg daily Neb albuterol, albuterol HFA, Xolair She says she feels weak but otherwise okay after taking Levaquin and prednisone for right lower lobe pneumonia treated at [facility] emergency room September 14.  No longer productive cough.  Appetite good.  No change in chronic hoarseness. She never followed through with ENT concern that she might have a laryngeal cancer and denies any change over at least 2 years ago now. Never biopsied- she didn't want it done. CXR 05/22/2018 IMPRESSION: 1. Worsening right lower lobe infiltrate/atelectasis with adjacent effusion. 2. Stable cardiomegaly  01/07/2019- 85 year old female former smoker followed for chronic asthma/COPD, chronic rhinosinusitis, lung nodules complicated by history A. fib/anticoagulation, CHF, cardiomyopathy, mitral insufficiency, GERD Barrett's, CAD Xolair- stopped by patient in 2018 when there was ? of throat cancer .-----breathing at baseline, states she's having "bad allergies", nasal congestion w/ white mucus She remains hoarse but says it has been better until recently and blames "allergy" this spring.  Zyrtec made her too sleepy.  Not much postnasal drainage.  She denies any wheeze or asthma flare as  she remains off of Xolair.  Infrequent use of rescue inhaler. CXR 06/18/18 Resolution of previously seen right basilar infiltrate.  Review of Systems-see HPI   + = positive Constitutional:    weight loss, night sweats, fevers, chills, +fatigue, lassitude. HEENT:   No-  headaches, difficulty swallowing, tooth/dental problems, sore throat, + hoarseness      No-  sneezing, itching, ear ache, no- nasal congestion, +post nasal drip,  CV:  No- chest pain, no-orthopnea, PND, swelling in lower extremities, anasarca, dizziness, palpitations Resp: +   shortness of breath with exertion or at rest.              No-   productive cough,  non-productive cough,  No- coughing up of blood.              No-   change in color of mucus.  wheezing.   Skin: No-   rash or lesions. GI:  No-   heartburn, indigestion, abdominal pain, nausea, vomiting,  GU: . MS:  No-   joint pain or swelling.  Neuro-     nothing unusual Psych:  No- change in mood or affect. No depression or anxiety.  No memory loss.  Objective:   Physical Exam General- Alert, Oriented, Affect-appropriate/ cheerful, Distress- none acute, thin Skin- no rash visible now on right lateral ribs Lymphadenopathy- none Head- atraumatic            Eyes- Gross vision intact, PERRLA, +conjunctivae injected            Ears- Hearing, canals-normal            Nose- clear, turbinate edema, no-Septal dev, mucus, polyps, erosion, perforation             Throat- Mallampati III , mucosa clear/ not red , drainage- none, tonsils- atrophic,  Dentures. +hoarseness not worse .                     +  Large torus hard palate Neck- flexible , trachea midline, no stridor , thyroid nl, carotid no bruit Chest - symmetrical excursion , unlabored           Heart/CV- +IRR/ AFib  + faint ? Mitral regurg murmur , no gallop  , + HR 132                              no rub, nl s1 s2                           - JVD- none , edema- none, stasis changes- none, varices- none            Lung-  + clear, unlabored, wheeze-none, Cough-none , dullness-none, rub- none           Chest wall-  Abd-  Br/ Gen/ Rectal- Not done, not indicated Extrem- cyanosis- none, clubbing, none, atrophy- none, strength- nl Neuro- slight resting tremor

## 2019-01-22 NOTE — Assessment & Plan Note (Signed)
Blames mild increased hoarseness on postnasal drainage but says Zyrtec makes her sleepy. Plan-report progressive hoarseness or throat problems.  Change to Claritin.

## 2019-01-22 NOTE — Assessment & Plan Note (Signed)
Denies episodic exacerbation and feels very stable without routine wheeze or cough.

## 2019-02-21 ENCOUNTER — Ambulatory Visit (INDEPENDENT_AMBULATORY_CARE_PROVIDER_SITE_OTHER): Payer: Medicare PPO | Admitting: *Deleted

## 2019-02-21 DIAGNOSIS — I482 Chronic atrial fibrillation, unspecified: Secondary | ICD-10-CM | POA: Diagnosis not present

## 2019-02-21 DIAGNOSIS — Z5181 Encounter for therapeutic drug level monitoring: Secondary | ICD-10-CM | POA: Diagnosis not present

## 2019-02-21 LAB — POCT INR: INR: 1.4 — AB (ref 2.0–3.0)

## 2019-02-21 NOTE — Patient Instructions (Signed)
Take 2 tablets tonight and tomorrow night then resume 1 1/2 tablets daily except 1 tablet on Sundays, Tuesdays and Thursdays Recheck in 4 weeks

## 2019-02-24 ENCOUNTER — Other Ambulatory Visit: Payer: Self-pay | Admitting: Cardiology

## 2019-03-23 ENCOUNTER — Other Ambulatory Visit: Payer: Self-pay | Admitting: Cardiology

## 2019-03-29 ENCOUNTER — Ambulatory Visit (HOSPITAL_COMMUNITY)
Admission: RE | Admit: 2019-03-29 | Discharge: 2019-03-29 | Disposition: A | Discharge status: Home / Self Care | Payer: Medicare PPO | Source: Ambulatory Visit | Attending: Cardiology | Admitting: Cardiology

## 2019-03-29 ENCOUNTER — Other Ambulatory Visit: Payer: Self-pay

## 2019-03-29 DIAGNOSIS — I059 Rheumatic mitral valve disease, unspecified: Secondary | ICD-10-CM | POA: Diagnosis not present

## 2019-03-29 NOTE — Progress Notes (Signed)
*  PRELIMINARY RESULTS* Echocardiogram 2D Echocardiogram has been performed.  Samuel Germany 03/29/2019, 11:13 AM

## 2019-04-04 ENCOUNTER — Ambulatory Visit (INDEPENDENT_AMBULATORY_CARE_PROVIDER_SITE_OTHER): Payer: Medicare PPO | Admitting: *Deleted

## 2019-04-04 DIAGNOSIS — Z5181 Encounter for therapeutic drug level monitoring: Secondary | ICD-10-CM | POA: Diagnosis not present

## 2019-04-04 DIAGNOSIS — I482 Chronic atrial fibrillation, unspecified: Secondary | ICD-10-CM | POA: Diagnosis not present

## 2019-04-04 LAB — POCT INR: INR: 2.3 (ref 2.0–3.0)

## 2019-04-04 NOTE — Patient Instructions (Signed)
Continue coumadin 1 1/2 tablets daily except 1 tablet on Sundays, Tuesdays and Thursdays Recheck in 3 weeks

## 2019-04-18 ENCOUNTER — Other Ambulatory Visit: Payer: Self-pay | Admitting: Internal Medicine

## 2019-04-18 NOTE — Telephone Encounter (Signed)
Tramadol refill e-sent

## 2019-04-18 NOTE — Telephone Encounter (Signed)
Please advise on tramadol refill thanks

## 2019-04-23 ENCOUNTER — Emergency Department (HOSPITAL_COMMUNITY): Payer: Medicare PPO

## 2019-04-23 ENCOUNTER — Inpatient Hospital Stay (HOSPITAL_COMMUNITY)
Admission: EM | Admit: 2019-04-23 | Discharge: 2019-05-03 | DRG: 964 | Disposition: A | Discharge status: Skilled Nursing Facility | Payer: Medicare PPO | Attending: General Surgery | Admitting: General Surgery

## 2019-04-23 ENCOUNTER — Encounter (HOSPITAL_COMMUNITY): Payer: Self-pay | Admitting: *Deleted

## 2019-04-23 ENCOUNTER — Other Ambulatory Visit: Payer: Self-pay

## 2019-04-23 DIAGNOSIS — N189 Chronic kidney disease, unspecified: Secondary | ICD-10-CM | POA: Diagnosis present

## 2019-04-23 DIAGNOSIS — M79641 Pain in right hand: Secondary | ICD-10-CM

## 2019-04-23 DIAGNOSIS — S0003XA Contusion of scalp, initial encounter: Secondary | ICD-10-CM | POA: Diagnosis not present

## 2019-04-23 DIAGNOSIS — S32401D Unspecified fracture of right acetabulum, subsequent encounter for fracture with routine healing: Secondary | ICD-10-CM | POA: Diagnosis not present

## 2019-04-23 DIAGNOSIS — Z03818 Encounter for observation for suspected exposure to other biological agents ruled out: Secondary | ICD-10-CM | POA: Diagnosis not present

## 2019-04-23 DIAGNOSIS — I6200 Nontraumatic subdural hemorrhage, unspecified: Secondary | ICD-10-CM | POA: Diagnosis not present

## 2019-04-23 DIAGNOSIS — S32591D Other specified fracture of right pubis, subsequent encounter for fracture with routine healing: Secondary | ICD-10-CM | POA: Diagnosis not present

## 2019-04-23 DIAGNOSIS — S065X0D Traumatic subdural hemorrhage without loss of consciousness, subsequent encounter: Secondary | ICD-10-CM | POA: Diagnosis not present

## 2019-04-23 DIAGNOSIS — Z7901 Long term (current) use of anticoagulants: Secondary | ICD-10-CM | POA: Diagnosis not present

## 2019-04-23 DIAGNOSIS — I482 Chronic atrial fibrillation, unspecified: Secondary | ICD-10-CM | POA: Diagnosis present

## 2019-04-23 DIAGNOSIS — S62612A Displaced fracture of proximal phalanx of right middle finger, initial encounter for closed fracture: Secondary | ICD-10-CM | POA: Diagnosis present

## 2019-04-23 DIAGNOSIS — I340 Nonrheumatic mitral (valve) insufficiency: Secondary | ICD-10-CM | POA: Diagnosis present

## 2019-04-23 DIAGNOSIS — Z881 Allergy status to other antibiotic agents status: Secondary | ICD-10-CM

## 2019-04-23 DIAGNOSIS — Z79899 Other long term (current) drug therapy: Secondary | ICD-10-CM | POA: Diagnosis not present

## 2019-04-23 DIAGNOSIS — W19XXXA Unspecified fall, initial encounter: Secondary | ICD-10-CM | POA: Diagnosis not present

## 2019-04-23 DIAGNOSIS — G4733 Obstructive sleep apnea (adult) (pediatric): Secondary | ICD-10-CM | POA: Diagnosis present

## 2019-04-23 DIAGNOSIS — R0902 Hypoxemia: Secondary | ICD-10-CM | POA: Diagnosis not present

## 2019-04-23 DIAGNOSIS — S32491D Other specified fracture of right acetabulum, subsequent encounter for fracture with routine healing: Secondary | ICD-10-CM | POA: Diagnosis not present

## 2019-04-23 DIAGNOSIS — K219 Gastro-esophageal reflux disease without esophagitis: Secondary | ICD-10-CM | POA: Diagnosis present

## 2019-04-23 DIAGNOSIS — Z20828 Contact with and (suspected) exposure to other viral communicable diseases: Secondary | ICD-10-CM | POA: Diagnosis present

## 2019-04-23 DIAGNOSIS — S32401A Unspecified fracture of right acetabulum, initial encounter for closed fracture: Secondary | ICD-10-CM | POA: Diagnosis present

## 2019-04-23 DIAGNOSIS — Z800 Family history of malignant neoplasm of digestive organs: Secondary | ICD-10-CM | POA: Diagnosis not present

## 2019-04-23 DIAGNOSIS — S62612D Displaced fracture of proximal phalanx of right middle finger, subsequent encounter for fracture with routine healing: Secondary | ICD-10-CM | POA: Diagnosis not present

## 2019-04-23 DIAGNOSIS — S065X9A Traumatic subdural hemorrhage with loss of consciousness of unspecified duration, initial encounter: Principal | ICD-10-CM | POA: Diagnosis present

## 2019-04-23 DIAGNOSIS — R5381 Other malaise: Secondary | ICD-10-CM | POA: Diagnosis not present

## 2019-04-23 DIAGNOSIS — Z880 Allergy status to penicillin: Secondary | ICD-10-CM

## 2019-04-23 DIAGNOSIS — S32491A Other specified fracture of right acetabulum, initial encounter for closed fracture: Secondary | ICD-10-CM | POA: Diagnosis not present

## 2019-04-23 DIAGNOSIS — S32501A Unspecified fracture of right pubis, initial encounter for closed fracture: Secondary | ICD-10-CM | POA: Diagnosis not present

## 2019-04-23 DIAGNOSIS — D649 Anemia, unspecified: Secondary | ICD-10-CM | POA: Diagnosis not present

## 2019-04-23 DIAGNOSIS — M2550 Pain in unspecified joint: Secondary | ICD-10-CM | POA: Diagnosis not present

## 2019-04-23 DIAGNOSIS — S32591A Other specified fracture of right pubis, initial encounter for closed fracture: Secondary | ICD-10-CM | POA: Diagnosis present

## 2019-04-23 DIAGNOSIS — S62612B Displaced fracture of proximal phalanx of right middle finger, initial encounter for open fracture: Secondary | ICD-10-CM | POA: Diagnosis not present

## 2019-04-23 DIAGNOSIS — S32415A Nondisplaced fracture of anterior wall of left acetabulum, initial encounter for closed fracture: Secondary | ICD-10-CM | POA: Diagnosis not present

## 2019-04-23 DIAGNOSIS — I428 Other cardiomyopathies: Secondary | ICD-10-CM | POA: Diagnosis present

## 2019-04-23 DIAGNOSIS — S32431A Displaced fracture of anterior column [iliopubic] of right acetabulum, initial encounter for closed fracture: Secondary | ICD-10-CM | POA: Diagnosis not present

## 2019-04-23 DIAGNOSIS — Z209 Contact with and (suspected) exposure to unspecified communicable disease: Secondary | ICD-10-CM | POA: Diagnosis not present

## 2019-04-23 DIAGNOSIS — S065X0A Traumatic subdural hemorrhage without loss of consciousness, initial encounter: Secondary | ICD-10-CM | POA: Diagnosis not present

## 2019-04-23 DIAGNOSIS — R52 Pain, unspecified: Secondary | ICD-10-CM | POA: Diagnosis not present

## 2019-04-23 DIAGNOSIS — Z87891 Personal history of nicotine dependence: Secondary | ICD-10-CM

## 2019-04-23 DIAGNOSIS — S32511A Fracture of superior rim of right pubis, initial encounter for closed fracture: Secondary | ICD-10-CM | POA: Diagnosis not present

## 2019-04-23 DIAGNOSIS — S02841A Fracture of lateral orbital wall, right side, initial encounter for closed fracture: Secondary | ICD-10-CM | POA: Diagnosis present

## 2019-04-23 DIAGNOSIS — W010XXA Fall on same level from slipping, tripping and stumbling without subsequent striking against object, initial encounter: Secondary | ICD-10-CM | POA: Diagnosis present

## 2019-04-23 DIAGNOSIS — Z79891 Long term (current) use of opiate analgesic: Secondary | ICD-10-CM | POA: Diagnosis not present

## 2019-04-23 DIAGNOSIS — I341 Nonrheumatic mitral (valve) prolapse: Secondary | ICD-10-CM | POA: Diagnosis present

## 2019-04-23 DIAGNOSIS — J449 Chronic obstructive pulmonary disease, unspecified: Secondary | ICD-10-CM | POA: Diagnosis present

## 2019-04-23 DIAGNOSIS — S066X0A Traumatic subarachnoid hemorrhage without loss of consciousness, initial encounter: Secondary | ICD-10-CM | POA: Diagnosis not present

## 2019-04-23 DIAGNOSIS — S0231XA Fracture of orbital floor, right side, initial encounter for closed fracture: Secondary | ICD-10-CM | POA: Diagnosis not present

## 2019-04-23 DIAGNOSIS — I480 Paroxysmal atrial fibrillation: Secondary | ICD-10-CM | POA: Diagnosis not present

## 2019-04-23 DIAGNOSIS — S72001A Fracture of unspecified part of neck of right femur, initial encounter for closed fracture: Secondary | ICD-10-CM | POA: Diagnosis present

## 2019-04-23 DIAGNOSIS — M6281 Muscle weakness (generalized): Secondary | ICD-10-CM | POA: Diagnosis not present

## 2019-04-23 DIAGNOSIS — S199XXA Unspecified injury of neck, initial encounter: Secondary | ICD-10-CM | POA: Diagnosis not present

## 2019-04-23 DIAGNOSIS — Z801 Family history of malignant neoplasm of trachea, bronchus and lung: Secondary | ICD-10-CM | POA: Diagnosis not present

## 2019-04-23 DIAGNOSIS — Z7401 Bed confinement status: Secondary | ICD-10-CM | POA: Diagnosis not present

## 2019-04-23 DIAGNOSIS — R262 Difficulty in walking, not elsewhere classified: Secondary | ICD-10-CM | POA: Diagnosis not present

## 2019-04-23 DIAGNOSIS — S0285XA Fracture of orbit, unspecified, initial encounter for closed fracture: Secondary | ICD-10-CM | POA: Diagnosis not present

## 2019-04-23 DIAGNOSIS — I429 Cardiomyopathy, unspecified: Secondary | ICD-10-CM | POA: Diagnosis not present

## 2019-04-23 DIAGNOSIS — M79604 Pain in right leg: Secondary | ICD-10-CM | POA: Diagnosis not present

## 2019-04-23 DIAGNOSIS — M25551 Pain in right hip: Secondary | ICD-10-CM | POA: Diagnosis not present

## 2019-04-23 DIAGNOSIS — Y92009 Unspecified place in unspecified non-institutional (private) residence as the place of occurrence of the external cause: Secondary | ICD-10-CM | POA: Diagnosis not present

## 2019-04-23 DIAGNOSIS — M542 Cervicalgia: Secondary | ICD-10-CM | POA: Diagnosis not present

## 2019-04-23 LAB — CBC WITH DIFFERENTIAL/PLATELET
Abs Immature Granulocytes: 0.07 10*3/uL (ref 0.00–0.07)
Basophils Absolute: 0 10*3/uL (ref 0.0–0.1)
Basophils Relative: 0 %
Eosinophils Absolute: 0.1 10*3/uL (ref 0.0–0.5)
Eosinophils Relative: 1 %
HCT: 40.1 % (ref 36.0–46.0)
Hemoglobin: 12.6 g/dL (ref 12.0–15.0)
Immature Granulocytes: 1 %
Lymphocytes Relative: 7 %
Lymphs Abs: 0.8 10*3/uL (ref 0.7–4.0)
MCH: 28.1 pg (ref 26.0–34.0)
MCHC: 31.4 g/dL (ref 30.0–36.0)
MCV: 89.5 fL (ref 80.0–100.0)
Monocytes Absolute: 0.7 10*3/uL (ref 0.1–1.0)
Monocytes Relative: 6 %
Neutro Abs: 10.3 10*3/uL — ABNORMAL HIGH (ref 1.7–7.7)
Neutrophils Relative %: 85 %
Platelets: 122 10*3/uL — ABNORMAL LOW (ref 150–400)
RBC: 4.48 MIL/uL (ref 3.87–5.11)
RDW: 14.3 % (ref 11.5–15.5)
WBC: 12 10*3/uL — ABNORMAL HIGH (ref 4.0–10.5)
nRBC: 0 % (ref 0.0–0.2)

## 2019-04-23 LAB — PROTIME-INR
INR: 2.7 — ABNORMAL HIGH (ref 0.8–1.2)
Prothrombin Time: 27.9 seconds — ABNORMAL HIGH (ref 11.4–15.2)

## 2019-04-23 LAB — COMPREHENSIVE METABOLIC PANEL
ALT: 23 U/L (ref 0–44)
AST: 32 U/L (ref 15–41)
Albumin: 3.4 g/dL — ABNORMAL LOW (ref 3.5–5.0)
Alkaline Phosphatase: 58 U/L (ref 38–126)
Anion gap: 7 (ref 5–15)
BUN: 22 mg/dL (ref 8–23)
CO2: 25 mmol/L (ref 22–32)
Calcium: 8.8 mg/dL — ABNORMAL LOW (ref 8.9–10.3)
Chloride: 107 mmol/L (ref 98–111)
Creatinine, Ser: 1.22 mg/dL — ABNORMAL HIGH (ref 0.44–1.00)
GFR calc Af Amer: 48 mL/min — ABNORMAL LOW (ref >60)
GFR calc non Af Amer: 41 mL/min — ABNORMAL LOW (ref >60)
Glucose, Bld: 141 mg/dL — ABNORMAL HIGH (ref 70–99)
Potassium: 4 mmol/L (ref 3.5–5.1)
Sodium: 139 mmol/L (ref 135–145)
Total Bilirubin: 0.5 mg/dL (ref 0.3–1.2)
Total Protein: 6.7 g/dL (ref 6.5–8.1)

## 2019-04-23 MED ORDER — VITAMIN K1 10 MG/ML IJ SOLN
10.0000 mg | INTRAVENOUS | Status: AC
  Administered 2019-04-23: 10 mg via INTRAVENOUS
  Filled 2019-04-23: qty 1

## 2019-04-23 MED ORDER — VITAMIN K1 10 MG/ML IJ SOLN
INTRAMUSCULAR | Status: AC
  Filled 2019-04-23: qty 1

## 2019-04-23 MED ORDER — HYDROMORPHONE HCL 1 MG/ML IJ SOLN
0.5000 mg | Freq: Once | INTRAMUSCULAR | Status: AC
  Administered 2019-04-23: 0.5 mg via INTRAVENOUS
  Filled 2019-04-23: qty 1

## 2019-04-23 MED ORDER — PROTHROMBIN COMPLEX CONC HUMAN 500 UNITS IV KIT
1647.0000 [IU] | PACK | Status: AC
  Administered 2019-04-23: 1647 [IU] via INTRAVENOUS
  Filled 2019-04-23: qty 1647

## 2019-04-23 MED ORDER — SODIUM CHLORIDE 0.9 % IV BOLUS
500.0000 mL | Freq: Once | INTRAVENOUS | Status: AC
  Administered 2019-04-23: 500 mL via INTRAVENOUS

## 2019-04-23 MED ORDER — ONDANSETRON HCL 4 MG/2ML IJ SOLN
4.0000 mg | Freq: Once | INTRAMUSCULAR | Status: AC
  Administered 2019-04-23: 4 mg via INTRAVENOUS
  Filled 2019-04-23: qty 2

## 2019-04-23 NOTE — ED Notes (Signed)
To Rad 

## 2019-04-23 NOTE — ED Notes (Signed)
Report to [employer]

## 2019-04-23 NOTE — ED Notes (Signed)
Call from [place] daughter Andee Poles  Update provided

## 2019-04-23 NOTE — ED Triage Notes (Signed)
Pt arrived to er by ems after a fall that occurred at home, pt was going through the doorway when her dog caused her to trip, c/o pain to right hip area, pt also hit her head on right side against the door frame. Denies any LOC, pt positive pedal pulses noted, swelling noted to right eye area.

## 2019-04-23 NOTE — ED Notes (Signed)
From Rad 

## 2019-04-23 NOTE — ED Provider Notes (Signed)
[facility] EMERGENCY DEPARTMENT Provider Note   CSN: [id number] Arrival date & time: 04/23/19  2041     History   Chief Complaint Chief Complaint  Patient presents with   Fall    HPI [name] is a 85 y.o. female.     Patient takes Coumadin for atrial fibrillation.  She tripped and fell today hit her head and complains of lower back pain. No loc  The history is provided by the patient. No language interpreter was used.  Fall This is a new problem. The current episode started 1 to 2 hours ago. The problem occurs rarely. The problem has been resolved. Pertinent negatives include no chest pain, no abdominal pain and no headaches. Nothing aggravates the symptoms. Nothing relieves the symptoms. She has tried nothing for the symptoms.    Past Medical History:  Diagnosis Date   Asthma    Atrial fibrillation ([place])    Chronic rhinitis    COPD (chronic obstructive pulmonary disease) (HCC)    GERD (gastroesophageal reflux disease)    Mitral regurgitation    Mitral valve prolapse    Nasal polyps    Nonischemic cardiomyopathy (HCC)    LVEF 25-30% improved to 45-50%   Obstructive sleep apnea    Transudative pleural effusion     Patient Active Problem List   Diagnosis Date Noted   Pneumonia involving right lung 06/18/2018   Hoarseness 07/22/2015   Acute rhinitis 03/18/2015   Sinusitis, acute maxillary 04/02/2014   Encounter for therapeutic drug monitoring 10/05/2013   Shingles 09/06/2013   Postherpetic neuralgia 09/04/2013   Arthritis, lumbar spine 04/20/2013   Back pain 04/20/2013   Mild anxiety 08/20/2012   Allergic rhinitis due to pollen 11/30/2011   Lung nodules 11/30/2011   Renal insufficiency 01/16/2011   Long term current use of anticoagulant 12/19/2010   ANEMIA 10/09/2010   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      Home Medications    Prior to Admission medications   Medication Sig Start Date End Date Taking? Authorizing Provider  albuterol (PROVENTIL HFA;VENTOLIN HFA) 108 (90 BASE) MCG/ACT inhaler Inhale 2 puffs into the lungs every 6 (six) hours as needed for wheezing or shortness of breath. 11/13/14  Yes Young, Clinton D, MD  albuterol (PROVENTIL) (2.5 MG/3ML) 0.083% nebulizer solution INHALE THE CONTENTS OF 1 VIAL USING NEBULIZER EVERY 4 HOURS AS NEEDED FOR WHEEZING OR SHORTNESS OF BREATH 08/05/16  Yes Young, Clinton D, MD  atorvastatin (LIPITOR) 10 MG tablet Take 10 mg by mouth daily.     Yes [provider]  digoxin (LANOXIN) 0.125 MG tablet TAKE 1 TABLET EVERY DAY Patient taking differently: Take 0.125 mg by mouth daily.  02/24/19  Yes Satira Sark, MD  diltiazem (CARDIZEM CD) 240 MG 24 hr capsule TAKE 1 CAPSULE BY MOUTH EVERY DAY Patient taking differently: Take 240 mg by mouth daily.  10/14/18  Yes Satira Sark, MD  docusate sodium (COLACE) 100 MG capsule Take 100 mg by  mouth 2 (two) times daily.   Yes [provider]  escitalopram (LEXAPRO) 10 MG tablet Take 10 mg by mouth every morning. 01/09/19  Yes [provider]  esomeprazole (NEXIUM) 40 MG capsule Take 40 mg by mouth 2 (two) times daily.     Yes [provider]  furosemide (LASIX) 40 MG tablet TAKE 1 TABLET EVERY DAY Patient taking differently: Take 40 mg by mouth daily as needed for fluid.  12/20/18  Yes Satira Sark, MD  phenylephrine-shark liver oil-mineral oil-petrolatum (PREPARATION H) 0.25-3-14-71.9  % rectal ointment Place 1 application rectally 2 (two) times daily as needed for hemorrhoids.   Yes [provider]  potassium chloride SA (KLOR-CON M20) 20 MEQ tablet Take 1 tablet (20 mEq total) by mouth daily. Patient taking differently: Take 20 mEq by mouth daily as needed (when taking Lasix).  10/09/11  Yes Satira Sark, MD  traMADol (ULTRAM) 50 MG tablet TAKE 1 TABLET BY MOUTH EVERY 4 HOURS AS NEEDED Patient taking differently: Take 50 mg by mouth every 4 (four) hours as needed for moderate pain or severe pain.  04/18/19  Yes [name] D, MD  warfarin (COUMADIN) 5 MG tablet Take 1 1/2 tablets daily except 1 tablet on Sundays, Tuesdays and Thursdays or as directed Patient taking differently: Take 5-7.5 mg by mouth See admin instructions. Take 7.5mg  (1.5  Tablets) daily except 5mg  (1 tablet) on Sundays, Tuesdays and Thursdays 03/24/19  Yes Satira Sark, MD  diazepam (VALIUM) 5 MG tablet TAKE 1 TABLET BY MOUTH EVERY 12 HOURS AS NEEDED Patient not taking: Reported on 04/23/2019 09/27/18   Deneise Lever, MD    Family History Family History  Problem Relation Age of Onset   Cancer Mother        Colon   Cancer Father        Colon   Atrial fibrillation Sister    Cancer Brother        Lung   Cancer Brother        Lung    Social History Social History   Tobacco Use   Smoking status: Former Smoker    Packs/day: 0.50    Years: 20.00    Pack years: 10.00    Types: Cigarettes    Quit date: 10/08/1980    Years since quitting: 38.5   Smokeless tobacco: Never Used  Substance Use Topics   Alcohol use: No    Alcohol/week: 0.0 standard drinks   Drug use: No     Allergies   Amoxicillin-pot clavulanate, Clarithromycin, Clindamycin, Doxycycline, and Penicillins   Review of Systems Review of Systems  Constitutional: Negative for appetite change and fatigue.  HENT: Negative for congestion, ear discharge and sinus pressure.        Headache  Eyes:  Negative for discharge.  Respiratory: Negative for cough.   Cardiovascular: Negative for chest pain.  Gastrointestinal: Negative for abdominal pain and diarrhea.  Genitourinary: Negative for frequency and hematuria.  Musculoskeletal: Positive for back pain.  Skin: Negative for rash.  Neurological: Negative for seizures and headaches.  Psychiatric/Behavioral: Negative for hallucinations.  All other systems reviewed and are negative.    Physical Exam Updated Vital Signs BP 118/70    Pulse 77    Temp 97.6 F (36.4 C) (Oral)    Resp 20    Ht 5\' 6"  (1.676 m)    Wt 67.1 kg    SpO2 96%    BMI 23.89 kg/m   Physical Exam Vitals signs  and nursing note reviewed.  Constitutional:      Appearance: She is well-developed.  HENT:     Head: Normocephalic.     Comments: Right orbital swelling.  Pupil equal reactive light accommodation    Nose: Nose normal.  Eyes:     General: No scleral icterus.    Conjunctiva/sclera: Conjunctivae normal.  Neck:     Musculoskeletal: Neck supple.     Thyroid: No thyromegaly.  Cardiovascular:     Rate and Rhythm: Normal rate and regular rhythm.     Heart sounds: No murmur. No friction rub. No gallop.   Pulmonary:     Breath sounds: No stridor. No wheezing or rales.  Chest:     Chest wall: No tenderness.  Abdominal:     General: There is no distension.     Tenderness: There is no abdominal tenderness. There is no rebound.  Musculoskeletal: Normal range of motion.     Comments: Tenderness to right hip and lower back  Lymphadenopathy:     Cervical: No cervical adenopathy.  Skin:    Findings: No erythema or rash.  Neurological:     Mental Status: She is alert and oriented to person, place, and time.     Motor: No abnormal muscle tone.     Coordination: Coordination normal.  Psychiatric:        Behavior: Behavior normal.      ED Treatments / Results  Labs (all labs ordered are listed, but only abnormal results are displayed) Labs Reviewed  CBC  WITH DIFFERENTIAL/PLATELET - Abnormal; Notable for the following components:      Result Value   WBC 12.0 (*)    Platelets 122 (*)    Neutro Abs 10.3 (*)    All other components within normal limits  COMPREHENSIVE METABOLIC PANEL - Abnormal; Notable for the following components:   Glucose, Bld 141 (*)    Creatinine, Ser 1.22 (*)    Calcium 8.8 (*)    Albumin 3.4 (*)    GFR calc non Af Amer 41 (*)    GFR calc Af Amer 48 (*)    All other components within normal limits  PROTIME-INR - Abnormal; Notable for the following components:   Prothrombin Time 27.9 (*)    INR 2.7 (*)    All other components within normal limits  SARS CORONAVIRUS 2 (HOSPITAL ORDER, [place] [facility])    EKG None  Radiology Ct Abdomen Pelvis Wo Contrast  Result Date: 04/23/2019 CLINICAL DATA:  Abdominal pain.  Trauma. EXAM: CT ABDOMEN AND PELVIS WITHOUT CONTRAST TECHNIQUE: Multidetector CT imaging of the abdomen and pelvis was performed following the standard protocol without IV contrast. COMPARISON:  CT chest dated July 14, 2013 FINDINGS: Lower chest: There are multiple scattered pulmonary nodules at the lung bases, measuring up to approximately 8 mm in the right lower lobe (axial series 4, image 3). These appear grossly similar in size and distribution when compared to CT from 2014. There is some bronchial wall thickening and mucus plugging at the lung bases bilaterally. There is significant cardiomegaly with biatrial enlargement. Hepatobiliary: The liver is normal. Normal gallbladder.The common bile duct is mildly dilated. Pancreas: The pancreatic duct is dilated without evidence for a discrete pancreatic mass. Spleen: No splenic laceration or hematoma. Adrenals/Urinary Tract: --Adrenal glands: No adrenal hemorrhage. --Right kidney/ureter: No hydronephrosis or perinephric hematoma. --Left kidney/ureter: No hydronephrosis or perinephric hematoma. --Urinary bladder: Unremarkable. Stomach/Bowel:  --Stomach/Duodenum: There is a moderate to large hiatal hernia. There  are surgical clips near the GE junction. The stomach is moderately distended. --Small bowel: There are dilated small bowel loops scattered throughout the abdomen without evidence for a distinct transition point. --Colon: There is a large amount of stool throughout the colon. --Appendix: Normal. Vascular/Lymphatic: Atherosclerotic calcification is present within the non-aneurysmal abdominal aorta, without hemodynamically significant stenosis. --No retroperitoneal lymphadenopathy. --No mesenteric lymphadenopathy. --No pelvic or inguinal lymphadenopathy. Reproductive: Unremarkable Other: No ascites or free air. The abdominal wall is normal. Musculoskeletal. There is a comminuted fracture of the right acetabulum with a moderate size surrounding pelvic hematoma. One of the fracture planes extends into the superior pubic ramus on the right. There is a nondisplaced fracture involving the right inferior pubic ramus. IMPRESSION: 1. Comminuted fracture of the right acetabulum with a moderate sized surrounding pelvic hematoma. There is a nondisplaced fracture of the right inferior pubic ramus. 2. Dilated loops of small bowel without evidence for a distinct transition point. Findings may represent ileus or partial small bowel obstruction. 3. Significant cardiomegaly. 4. Large amount of stool throughout the colon. 5. Mildly dilated common bile duct and pancreatic duct without evidence for discrete pancreatic mass. Follow-up with a nonemergent outpatient MRCP is recommended for further evaluation of this finding. 6. Additional chronic findings as above. Electronically Signed   By: Constance Holster M.D.   On: 04/23/2019 22:06   Ct Head Wo Contrast  Result Date: 04/23/2019 CLINICAL DATA:  Right facial swelling laceration following a fall. Sharp right neck pain and stiffness. EXAM: CT HEAD WITHOUT CONTRAST CT MAXILLOFACIAL WITHOUT CONTRAST CT CERVICAL SPINE  WITHOUT CONTRAST TECHNIQUE: Multidetector CT imaging of the head, cervical spine, and maxillofacial structures were performed using the standard protocol without intravenous contrast. Multiplanar CT image reconstructions of the cervical spine and maxillofacial structures were also generated. COMPARISON:  Neck CT dated 07/30/2016 FINDINGS: CT HEAD FINDINGS Brain: Mildly enlarged ventricles and cortical sulci. Mild patchy white matter low density in both cerebral hemispheres. Small right lateral subdural hematoma measuring 4 mm in maximum thickness. 4 mm of midline shift to the left. No mass lesion or CT evidence of acute infarction. Vascular: No hyperdense vessel or unexpected calcification. Skull: Normal. Negative for fracture or focal lesion. Other: None. CT MAXILLOFACIAL FINDINGS Osseous: Mildly comminuted right lateral orbital wall fracture with minimal medial displacement and lateral angulation of the anterior fragment. No other fractures are seen. Orbits: Normal appearing intraorbital contents. Sinuses: Moderate right maxillary sinus mucosal thickening and surgical absence of portions of the medial walls of both maxillary sinuses. Soft tissues: Right periorbital hematoma. CT CERVICAL SPINE FINDINGS Alignment: Normal. Skull base and vertebrae: No acute fracture. No primary bone lesion or focal pathologic process. Soft tissues and spinal canal: No prevertebral fluid or swelling. No visible canal hematoma. Disc levels:  Multilevel degenerative changes. Upper chest: Minimal biapical pleural and parenchymal scarring. Other: Bilateral carotid artery calcifications. IMPRESSION: 1. 4 mm right lateral subdural hematoma and 4 mm of midline shift to the left. 2. Mildly comminuted and minimally displaced right lateral orbital wall fracture. 3. Right periorbital hematoma. 4. No skull fracture. 5. No cervical spine fracture or subluxation. 6. Multilevel cervical spine degenerative changes. 7. Mild diffuse cerebral and  cerebellar atrophy and mild chronic small vessel white matter ischemic changes in both cerebral hemispheres. 8. Bilateral carotid artery atheromatous calcifications. Critical Value/emergent results were called by telephone at the time of interpretation on 04/23/2019 at 10:11 pm to Dr. Milton Ferguson , who verbally acknowledged these results. Electronically Signed   By: Remo Lipps  Joneen Caraway M.D.   On: 04/23/2019 22:16   Ct Cervical Spine Wo Contrast  Result Date: 04/23/2019 CLINICAL DATA:  Right facial swelling laceration following a fall. Sharp right neck pain and stiffness. EXAM: CT HEAD WITHOUT CONTRAST CT MAXILLOFACIAL WITHOUT CONTRAST CT CERVICAL SPINE WITHOUT CONTRAST TECHNIQUE: Multidetector CT imaging of the head, cervical spine, and maxillofacial structures were performed using the standard protocol without intravenous contrast. Multiplanar CT image reconstructions of the cervical spine and maxillofacial structures were also generated. COMPARISON:  Neck CT dated 07/30/2016 FINDINGS: CT HEAD FINDINGS Brain: Mildly enlarged ventricles and cortical sulci. Mild patchy white matter low density in both cerebral hemispheres. Small right lateral subdural hematoma measuring 4 mm in maximum thickness. 4 mm of midline shift to the left. No mass lesion or CT evidence of acute infarction. Vascular: No hyperdense vessel or unexpected calcification. Skull: Normal. Negative for fracture or focal lesion. Other: None. CT MAXILLOFACIAL FINDINGS Osseous: Mildly comminuted right lateral orbital wall fracture with minimal medial displacement and lateral angulation of the anterior fragment. No other fractures are seen. Orbits: Normal appearing intraorbital contents. Sinuses: Moderate right maxillary sinus mucosal thickening and surgical absence of portions of the medial walls of both maxillary sinuses. Soft tissues: Right periorbital hematoma. CT CERVICAL SPINE FINDINGS Alignment: Normal. Skull base and vertebrae: No acute fracture. No  primary bone lesion or focal pathologic process. Soft tissues and spinal canal: No prevertebral fluid or swelling. No visible canal hematoma. Disc levels:  Multilevel degenerative changes. Upper chest: Minimal biapical pleural and parenchymal scarring. Other: Bilateral carotid artery calcifications. IMPRESSION: 1. 4 mm right lateral subdural hematoma and 4 mm of midline shift to the left. 2. Mildly comminuted and minimally displaced right lateral orbital wall fracture. 3. Right periorbital hematoma. 4. No skull fracture. 5. No cervical spine fracture or subluxation. 6. Multilevel cervical spine degenerative changes. 7. Mild diffuse cerebral and cerebellar atrophy and mild chronic small vessel white matter ischemic changes in both cerebral hemispheres. 8. Bilateral carotid artery atheromatous calcifications. Critical Value/emergent results were called by telephone at the time of interpretation on 04/23/2019 at 10:11 pm to Dr. Milton Ferguson , who verbally acknowledged these results. Electronically Signed   By: Claudie Revering M.D.   On: 04/23/2019 22:16   Ct Hip Right Wo Contrast  Result Date: 04/23/2019 CLINICAL DATA:  Pain EXAM: CT OF THE RIGHT HIP WITHOUT CONTRAST TECHNIQUE: Multidetector CT imaging of the right hip was performed according to the standard protocol. Multiplanar CT image reconstructions were also generated. COMPARISON:  None. FINDINGS: Bones/Joint/Cartilage There is a comminuted acute fracture involving the right acetabulum, primarily involving the anterior column. There is a nondisplaced fracture involving the right inferior pubic ramus. A fracture plane extends through the right superior pubic ramus. There is no dislocation. There is no evidence for displaced fracture involving the proximal right femur Ligaments Suboptimally assessed by CT. Muscles and Tendons No acute abnormality detected. Soft tissues There is a moderate size right-sided pelvic hematoma. Detection of active extravasation is not  possible on this noncontrast examination. IMPRESSION: 1. Acute comminuted fracture of the right acetabulum as detailed above. Nondisplaced fracture of the inferior pubic ramus on the right. 2. Moderate size right-sided pelvic hematoma. Electronically Signed   By: Constance Holster M.D.   On: 04/23/2019 22:09   Ct Maxillofacial Wo Contrast  Result Date: 04/23/2019 CLINICAL DATA:  Right facial swelling laceration following a fall. Sharp right neck pain and stiffness. EXAM: CT HEAD WITHOUT CONTRAST CT MAXILLOFACIAL WITHOUT CONTRAST CT CERVICAL SPINE WITHOUT  CONTRAST TECHNIQUE: Multidetector CT imaging of the head, cervical spine, and maxillofacial structures were performed using the standard protocol without intravenous contrast. Multiplanar CT image reconstructions of the cervical spine and maxillofacial structures were also generated. COMPARISON:  Neck CT dated 07/30/2016 FINDINGS: CT HEAD FINDINGS Brain: Mildly enlarged ventricles and cortical sulci. Mild patchy white matter low density in both cerebral hemispheres. Small right lateral subdural hematoma measuring 4 mm in maximum thickness. 4 mm of midline shift to the left. No mass lesion or CT evidence of acute infarction. Vascular: No hyperdense vessel or unexpected calcification. Skull: Normal. Negative for fracture or focal lesion. Other: None. CT MAXILLOFACIAL FINDINGS Osseous: Mildly comminuted right lateral orbital wall fracture with minimal medial displacement and lateral angulation of the anterior fragment. No other fractures are seen. Orbits: Normal appearing intraorbital contents. Sinuses: Moderate right maxillary sinus mucosal thickening and surgical absence of portions of the medial walls of both maxillary sinuses. Soft tissues: Right periorbital hematoma. CT CERVICAL SPINE FINDINGS Alignment: Normal. Skull base and vertebrae: No acute fracture. No primary bone lesion or focal pathologic process. Soft tissues and spinal canal: No prevertebral fluid  or swelling. No visible canal hematoma. Disc levels:  Multilevel degenerative changes. Upper chest: Minimal biapical pleural and parenchymal scarring. Other: Bilateral carotid artery calcifications. IMPRESSION: 1. 4 mm right lateral subdural hematoma and 4 mm of midline shift to the left. 2. Mildly comminuted and minimally displaced right lateral orbital wall fracture. 3. Right periorbital hematoma. 4. No skull fracture. 5. No cervical spine fracture or subluxation. 6. Multilevel cervical spine degenerative changes. 7. Mild diffuse cerebral and cerebellar atrophy and mild chronic small vessel white matter ischemic changes in both cerebral hemispheres. 8. Bilateral carotid artery atheromatous calcifications. Critical Value/emergent results were called by telephone at the time of interpretation on 04/23/2019 at 10:11 pm to Dr. Milton Ferguson , who verbally acknowledged these results. Electronically Signed   By: Claudie Revering M.D.   On: 04/23/2019 22:16    Procedures Procedures (including critical care time)  Medications Ordered in ED Medications  prothrombin complex conc human (KCENTRA) IVPB 1,500 Units (has no administration in time range)  phytonadione (VITAMIN K) 10 mg in dextrose 5 % 50 mL IVPB (has no administration in time range)  HYDROmorphone (DILAUDID) injection 0.5 mg (0.5 mg Intravenous Given 04/23/19 2241)  ondansetron (ZOFRAN) injection 4 mg (4 mg Intravenous Given 04/23/19 2237)  sodium chloride 0.9 % bolus 500 mL (500 mLs Intravenous New Bag/Given 04/23/19 2242)  phytonadione (VITAMIN K) 10 MG/ML SQ injection (has no administration in time range)     Initial Impression / Assessment and Plan / ED Course  I have reviewed the triage vital signs and the nursing notes.  Pertinent labs & imaging results that were available during my care of the patient were reviewed by me and considered in my medical decision making (see chart for details).    CRITICAL CARE Performed by: Milton Ferguson Total  critical care time:37minutes Critical care time was exclusive of separately billable procedures and treating other patients. Critical care was necessary to treat or prevent imminent or life-threatening deterioration. Critical care was time spent personally by me on the following activities: development of treatment plan with patient and/or surrogate as well as nursing, discussions with consultants, evaluation of patient's response to treatment, examination of patient, obtaining history from patient or surrogate, ordering and performing treatments and interventions, ordering and review of laboratory studies, ordering and review of radiographic studies, pulse oximetry and re-evaluation of patient's condition.  CT scan shows small subdural along with an orbital wall fracture on the right.  She also has a right acetabulum comminuted fracture.  L also a nondisplaced inferior pubic rami fracture.  I spoke with neurosurgery and they will consult on the patient.  They agree with giving the patient Eppie Gibson and they stated they probably will just repeat a CT of her head tomorrow.  I spoke with trauma surgery and they will see the patient in the emergency department over at [facility].  Dr. Brantley Stage excepting, I spoke to Dr. Reather Converse in the emergency department [facility] and he is aware of the patient coming to the ED.    Final Clinical Impressions(s) / ED Diagnoses   Final diagnoses:  Fall, initial encounter    ED Discharge Orders    None       Milton Ferguson, MD 04/23/19 2255

## 2019-04-23 NOTE — ED Notes (Signed)
Report given to Gretta Cool, Therapist, sports at [facility] ED.

## 2019-04-23 NOTE — ED Notes (Signed)
Report to Michael, RN

## 2019-04-23 NOTE — ED Notes (Signed)
Pt at home trying to prervent dogs fromm getting out of door  They made her fall hitting R forehead and low back   Swelling noted to R forehead, low back and R hand   Ice to forehead

## 2019-04-24 ENCOUNTER — Inpatient Hospital Stay (HOSPITAL_COMMUNITY): Payer: Medicare PPO

## 2019-04-24 DIAGNOSIS — I340 Nonrheumatic mitral (valve) insufficiency: Secondary | ICD-10-CM | POA: Diagnosis present

## 2019-04-24 DIAGNOSIS — Y92009 Unspecified place in unspecified non-institutional (private) residence as the place of occurrence of the external cause: Secondary | ICD-10-CM | POA: Diagnosis not present

## 2019-04-24 DIAGNOSIS — S065X9A Traumatic subdural hemorrhage with loss of consciousness of unspecified duration, initial encounter: Secondary | ICD-10-CM | POA: Diagnosis present

## 2019-04-24 DIAGNOSIS — S32401A Unspecified fracture of right acetabulum, initial encounter for closed fracture: Secondary | ICD-10-CM | POA: Diagnosis present

## 2019-04-24 DIAGNOSIS — W010XXA Fall on same level from slipping, tripping and stumbling without subsequent striking against object, initial encounter: Secondary | ICD-10-CM | POA: Diagnosis present

## 2019-04-24 DIAGNOSIS — Z79899 Other long term (current) drug therapy: Secondary | ICD-10-CM | POA: Diagnosis not present

## 2019-04-24 DIAGNOSIS — N189 Chronic kidney disease, unspecified: Secondary | ICD-10-CM | POA: Diagnosis present

## 2019-04-24 DIAGNOSIS — J449 Chronic obstructive pulmonary disease, unspecified: Secondary | ICD-10-CM | POA: Diagnosis present

## 2019-04-24 DIAGNOSIS — G4733 Obstructive sleep apnea (adult) (pediatric): Secondary | ICD-10-CM | POA: Diagnosis present

## 2019-04-24 DIAGNOSIS — Z800 Family history of malignant neoplasm of digestive organs: Secondary | ICD-10-CM | POA: Diagnosis not present

## 2019-04-24 DIAGNOSIS — Z20828 Contact with and (suspected) exposure to other viral communicable diseases: Secondary | ICD-10-CM | POA: Diagnosis present

## 2019-04-24 DIAGNOSIS — S32591A Other specified fracture of right pubis, initial encounter for closed fracture: Secondary | ICD-10-CM | POA: Diagnosis present

## 2019-04-24 DIAGNOSIS — Z7901 Long term (current) use of anticoagulants: Secondary | ICD-10-CM | POA: Diagnosis not present

## 2019-04-24 DIAGNOSIS — S72001A Fracture of unspecified part of neck of right femur, initial encounter for closed fracture: Secondary | ICD-10-CM | POA: Diagnosis present

## 2019-04-24 DIAGNOSIS — Z79891 Long term (current) use of opiate analgesic: Secondary | ICD-10-CM | POA: Diagnosis not present

## 2019-04-24 DIAGNOSIS — Z881 Allergy status to other antibiotic agents status: Secondary | ICD-10-CM | POA: Diagnosis not present

## 2019-04-24 DIAGNOSIS — S62612A Displaced fracture of proximal phalanx of right middle finger, initial encounter for closed fracture: Secondary | ICD-10-CM | POA: Diagnosis present

## 2019-04-24 DIAGNOSIS — I341 Nonrheumatic mitral (valve) prolapse: Secondary | ICD-10-CM | POA: Diagnosis present

## 2019-04-24 DIAGNOSIS — I428 Other cardiomyopathies: Secondary | ICD-10-CM | POA: Diagnosis present

## 2019-04-24 DIAGNOSIS — Z880 Allergy status to penicillin: Secondary | ICD-10-CM | POA: Diagnosis not present

## 2019-04-24 DIAGNOSIS — Z801 Family history of malignant neoplasm of trachea, bronchus and lung: Secondary | ICD-10-CM | POA: Diagnosis not present

## 2019-04-24 DIAGNOSIS — S02841A Fracture of lateral orbital wall, right side, initial encounter for closed fracture: Secondary | ICD-10-CM | POA: Diagnosis present

## 2019-04-24 DIAGNOSIS — K219 Gastro-esophageal reflux disease without esophagitis: Secondary | ICD-10-CM | POA: Diagnosis present

## 2019-04-24 DIAGNOSIS — W19XXXA Unspecified fall, initial encounter: Secondary | ICD-10-CM | POA: Diagnosis present

## 2019-04-24 DIAGNOSIS — Z87891 Personal history of nicotine dependence: Secondary | ICD-10-CM | POA: Diagnosis not present

## 2019-04-24 DIAGNOSIS — I482 Chronic atrial fibrillation, unspecified: Secondary | ICD-10-CM | POA: Diagnosis present

## 2019-04-24 LAB — CBC
HCT: 38.6 % (ref 36.0–46.0)
Hemoglobin: 12.3 g/dL (ref 12.0–15.0)
MCH: 28.9 pg (ref 26.0–34.0)
MCHC: 31.9 g/dL (ref 30.0–36.0)
MCV: 90.6 fL (ref 80.0–100.0)
Platelets: 125 10*3/uL — ABNORMAL LOW (ref 150–400)
RBC: 4.26 MIL/uL (ref 3.87–5.11)
RDW: 14.3 % (ref 11.5–15.5)
WBC: 12.4 10*3/uL — ABNORMAL HIGH (ref 4.0–10.5)
nRBC: 0 % (ref 0.0–0.2)

## 2019-04-24 LAB — COMPREHENSIVE METABOLIC PANEL
ALT: 20 U/L (ref 0–44)
AST: 26 U/L (ref 15–41)
Albumin: 3.1 g/dL — ABNORMAL LOW (ref 3.5–5.0)
Alkaline Phosphatase: 51 U/L (ref 38–126)
Anion gap: 10 (ref 5–15)
BUN: 19 mg/dL (ref 8–23)
CO2: 21 mmol/L — ABNORMAL LOW (ref 22–32)
Calcium: 8.5 mg/dL — ABNORMAL LOW (ref 8.9–10.3)
Chloride: 104 mmol/L (ref 98–111)
Creatinine, Ser: 1.15 mg/dL — ABNORMAL HIGH (ref 0.44–1.00)
GFR calc Af Amer: 51 mL/min — ABNORMAL LOW (ref >60)
GFR calc non Af Amer: 44 mL/min — ABNORMAL LOW (ref >60)
Glucose, Bld: 177 mg/dL — ABNORMAL HIGH (ref 70–99)
Potassium: 4.3 mmol/L (ref 3.5–5.1)
Sodium: 135 mmol/L (ref 135–145)
Total Bilirubin: 0.7 mg/dL (ref 0.3–1.2)
Total Protein: 6.3 g/dL — ABNORMAL LOW (ref 6.5–8.1)

## 2019-04-24 LAB — APTT: aPTT: 31 seconds (ref 24–36)

## 2019-04-24 LAB — PROTIME-INR
INR: 1.3 — ABNORMAL HIGH (ref 0.8–1.2)
INR: 1.2 (ref 0.8–1.2)
INR: 1.1 (ref 0.8–1.2)
INR: 1.1 (ref 0.8–1.2)
Prothrombin Time: 15.7 seconds — ABNORMAL HIGH (ref 11.4–15.2)
Prothrombin Time: 15.2 seconds (ref 11.4–15.2)
Prothrombin Time: 14 seconds (ref 11.4–15.2)
Prothrombin Time: 14.1 seconds (ref 11.4–15.2)

## 2019-04-24 LAB — MRSA PCR SCREENING: MRSA by PCR: NEGATIVE

## 2019-04-24 LAB — SARS CORONAVIRUS 2 BY RT PCR (HOSPITAL ORDER, PERFORMED IN ~~LOC~~ HOSPITAL LAB): SARS Coronavirus 2: NEGATIVE

## 2019-04-24 MED ORDER — ONDANSETRON HCL 4 MG/2ML IJ SOLN
4.0000 mg | Freq: Four times a day (QID) | INTRAMUSCULAR | Status: DC | PRN
  Administered 2019-04-27: 4 mg via INTRAVENOUS
  Filled 2019-04-24: qty 2

## 2019-04-24 MED ORDER — OXYCODONE HCL 5 MG PO TABS: 5.0000 mg | ORAL_TABLET | ORAL | Status: DC | PRN

## 2019-04-24 MED ORDER — DILTIAZEM HCL ER COATED BEADS 240 MG PO CP24
240.0000 mg | ORAL_CAPSULE | Freq: Every day | ORAL | Status: DC
  Administered 2019-04-24 – 2019-05-03 (×10): 240 mg via ORAL
  Filled 2019-04-24 (×10): qty 1

## 2019-04-24 MED ORDER — FUROSEMIDE 40 MG PO TABS: 40.0000 mg | ORAL_TABLET | Freq: Every day | ORAL | Status: DC | PRN

## 2019-04-24 MED ORDER — CHLORHEXIDINE GLUCONATE CLOTH 2 % EX PADS
6.0000 | MEDICATED_PAD | Freq: Every day | CUTANEOUS | Status: DC
  Administered 2019-04-24 – 2019-05-03 (×7): 6 via TOPICAL

## 2019-04-24 MED ORDER — MORPHINE SULFATE (PF) 2 MG/ML IV SOLN
2.0000 mg | INTRAVENOUS | Status: DC | PRN
  Administered 2019-04-24 (×2): 4 mg via INTRAVENOUS
  Filled 2019-04-24 (×2): qty 2

## 2019-04-24 MED ORDER — HYDRALAZINE HCL 20 MG/ML IJ SOLN: 10.0000 mg | INTRAMUSCULAR | Status: DC | PRN

## 2019-04-24 MED ORDER — ONDANSETRON 4 MG PO TBDP: 4.0000 mg | ORAL_TABLET | Freq: Four times a day (QID) | ORAL | Status: DC | PRN

## 2019-04-24 MED ORDER — POTASSIUM CHLORIDE CRYS ER 20 MEQ PO TBCR
20.0000 meq | EXTENDED_RELEASE_TABLET | Freq: Every day | ORAL | Status: DC
  Administered 2019-04-24 – 2019-05-03 (×10): 20 meq via ORAL
  Filled 2019-04-24 (×10): qty 1

## 2019-04-24 MED ORDER — ATORVASTATIN CALCIUM 10 MG PO TABS
10.0000 mg | ORAL_TABLET | Freq: Every day | ORAL | Status: DC
  Administered 2019-04-24 – 2019-05-03 (×10): 10 mg via ORAL
  Filled 2019-04-24 (×10): qty 1

## 2019-04-24 MED ORDER — ESCITALOPRAM OXALATE 10 MG PO TABS
10.0000 mg | ORAL_TABLET | Freq: Every morning | ORAL | Status: DC
  Administered 2019-04-24 – 2019-05-03 (×10): 10 mg via ORAL
  Filled 2019-04-24 (×10): qty 1

## 2019-04-24 MED ORDER — PANTOPRAZOLE SODIUM 40 MG PO TBEC
40.0000 mg | DELAYED_RELEASE_TABLET | Freq: Every day | ORAL | Status: DC
  Administered 2019-04-24 – 2019-05-03 (×10): 40 mg via ORAL
  Filled 2019-04-24 (×10): qty 1

## 2019-04-24 MED ORDER — DOCUSATE SODIUM 100 MG PO CAPS
100.0000 mg | ORAL_CAPSULE | Freq: Two times a day (BID) | ORAL | Status: DC
  Administered 2019-04-24 – 2019-05-03 (×16): 100 mg via ORAL
  Filled 2019-04-24 (×17): qty 1

## 2019-04-24 MED ORDER — ALBUTEROL SULFATE (2.5 MG/3ML) 0.083% IN NEBU: 2.5000 mg | INHALATION_SOLUTION | Freq: Four times a day (QID) | RESPIRATORY_TRACT | Status: DC | PRN

## 2019-04-24 MED ORDER — DIGOXIN 125 MCG PO TABS
0.1250 mg | ORAL_TABLET | Freq: Every day | ORAL | Status: DC
  Administered 2019-04-24 – 2019-05-03 (×10): 0.125 mg via ORAL
  Filled 2019-04-24 (×10): qty 1

## 2019-04-24 MED ORDER — TRAMADOL HCL 50 MG PO TABS
50.0000 mg | ORAL_TABLET | Freq: Four times a day (QID) | ORAL | Status: DC | PRN
  Administered 2019-04-25 – 2019-05-03 (×12): 50 mg via ORAL
  Filled 2019-04-24 (×13): qty 1

## 2019-04-24 MED ORDER — HYDROMORPHONE HCL 1 MG/ML IJ SOLN
1.0000 mg | INTRAMUSCULAR | Status: DC | PRN
  Administered 2019-04-24 (×2): 1 mg via INTRAVENOUS
  Filled 2019-04-24 (×2): qty 1

## 2019-04-24 MED ORDER — ACETAMINOPHEN 325 MG PO TABS
650.0000 mg | ORAL_TABLET | Freq: Four times a day (QID) | ORAL | Status: DC | PRN
  Filled 2019-04-24: qty 2

## 2019-04-24 MED ORDER — DEXTROSE-NACL 5-0.9 % IV SOLN
INTRAVENOUS | Status: DC
  Administered 2019-04-24: 75 mL/h via INTRAVENOUS
  Administered 2019-04-24 – 2019-04-27 (×3): via INTRAVENOUS

## 2019-04-24 MED ORDER — ALBUTEROL SULFATE (2.5 MG/3ML) 0.083% IN NEBU
2.5000 mg | INHALATION_SOLUTION | RESPIRATORY_TRACT | Status: DC | PRN
  Administered 2019-04-25: 2.5 mg via RESPIRATORY_TRACT
  Filled 2019-04-24: qty 3

## 2019-04-24 NOTE — Consult Note (Signed)
Reason for Consult: Facial fracture Referring Physician: Trauma  [name] is an 85 y.o. female.  HPI: 85 year old female fell yesterday over her dog and onto her right side.  She is on anti-coagulation for atrial fibrillation.  She was brought to the hospital and found to have a subdural hematoma, right hip fracture, and right lateral orbital wall fracture.  She complains only of right hip pain and inability to see from the right eye due to swelling of her eyelids.  Past Medical History:  Diagnosis Date  . Asthma   . Atrial fibrillation ([place])   . Chronic rhinitis   . COPD (chronic obstructive pulmonary disease) ([place])   . GERD (gastroesophageal reflux disease)   . Mitral regurgitation   . Mitral valve prolapse   . Nasal polyps   . Nonischemic cardiomyopathy ([facility])    LVEF 25-30% improved to 45-50%  . Obstructive sleep apnea   . Transudative pleural effusion     Past Surgical History:  Procedure Laterality Date  . APPENDECTOMY  2000  . Bilateral cataract surgery    . LAPAROSCOPIC NISSEN FUNDOPLICATION  3244  . NOSE SURGERY      Family History  Problem Relation Age of Onset  . Cancer Mother        Colon  . Cancer Father        Colon  . Atrial fibrillation Sister   . Cancer Brother        Lung  . Cancer Brother        Lung    Social History:  reports that she quit smoking about 38 years ago. Her smoking use included cigarettes. She has a 10.00 pack-year smoking history. She has never used smokeless tobacco. She reports that she does not drink alcohol or use drugs.  Allergies:  Allergies  Allergen Reactions  . Amoxicillin-Pot Clavulanate Itching and Swelling  . Clarithromycin Itching and Swelling  . Clindamycin Itching and Swelling  . Doxycycline Itching and Swelling  . Penicillins Itching and Swelling    Did it involve swelling of the face/tongue/throat, SOB, or low BP? Yes Did it involve sudden or severe rash/hives, skin peeling, or any reaction on the inside  of your mouth or nose? No Did you need to seek medical attention at a hospital or doctor's office? Yes When did it last happen?Over 10 years If all above answers are "NO", may proceed with cephalosporin use.     Medications: I have reviewed the patient's current medications.  Results for orders placed or performed during the hospital encounter of 04/23/19 (from the past 48 hour(s))  CBC with Differential/Platelet     Status: Abnormal   Collection Time: 04/23/19 10:16 PM  Result Value Ref Range   WBC 12.0 (H) 4.0 - 10.5 K/uL   RBC 4.48 3.87 - 5.11 MIL/uL   Hemoglobin 12.6 12.0 - 15.0 g/dL   HCT 40.1 36.0 - 46.0 %   MCV 89.5 80.0 - 100.0 fL   MCH 28.1 26.0 - 34.0 pg   MCHC 31.4 30.0 - 36.0 g/dL   RDW 14.3 11.5 - 15.5 %   Platelets 122 (L) 150 - 400 K/uL   nRBC 0.0 0.0 - 0.2 %   Neutrophils Relative % 85 %   Neutro Abs 10.3 (H) 1.7 - 7.7 K/uL   Lymphocytes Relative 7 %   Lymphs Abs 0.8 0.7 - 4.0 K/uL   Monocytes Relative 6 %   Monocytes Absolute 0.7 0.1 - 1.0 K/uL   Eosinophils  Relative 1 %   Eosinophils Absolute 0.1 0.0 - 0.5 K/uL   Basophils Relative 0 %   Basophils Absolute 0.0 0.0 - 0.1 K/uL   Immature Granulocytes 1 %   Abs Immature Granulocytes 0.07 0.00 - 0.07 K/uL    Comment: Performed at [facility], [address], [place], [place] [address]  Comprehensive metabolic panel     Status: Abnormal   Collection Time: 04/23/19 10:16 PM  Result Value Ref Range   Sodium 139 135 - 145 mmol/L   Potassium 4.0 3.5 - 5.1 mmol/L   Chloride 107 98 - 111 mmol/L   CO2 25 22 - 32 mmol/L   Glucose, Bld 141 (H) 70 - 99 mg/dL   BUN 22 8 - 23 mg/dL   Creatinine, Ser 1.22 (H) 0.44 - 1.00 mg/dL   Calcium 8.8 (L) 8.9 - 10.3 mg/dL   Total Protein 6.7 6.5 - 8.1 g/dL   Albumin 3.4 (L) 3.5 - 5.0 g/dL   AST 32 15 - 41 U/L   ALT 23 0 - 44 U/L   Alkaline Phosphatase 58 38 - 126 U/L   Total Bilirubin 0.5 0.3 - 1.2 mg/dL   GFR calc non Af Amer 41 (L) >60 mL/min   GFR calc Af Amer 48  (L) >60 mL/min   Anion gap 7 5 - 15    Comment: Performed at [facility], [address], [place], [place] [address]  Protime-INR     Status: Abnormal   Collection Time: 04/23/19 10:16 PM  Result Value Ref Range   Prothrombin Time 27.9 (H) 11.4 - 15.2 seconds   INR 2.7 (H) 0.8 - 1.2    Comment: (NOTE) INR goal varies based on device and disease states. Performed at [facility], [address], [place], [place] [address]   SARS Coronavirus 2 [facility] order, Performed in [facility] hospital lab) Nasopharyngeal Nasopharyngeal Swab     Status: None   Collection Time: 04/23/19 11:09 PM   Specimen: Nasopharyngeal Swab  Result Value Ref Range   SARS Coronavirus 2 NEGATIVE NEGATIVE    Comment: (NOTE) If result is NEGATIVE SARS-CoV-2 target nucleic acids are NOT DETECTED. The SARS-CoV-2 RNA is generally detectable in upper and lower  respiratory specimens during the acute phase of infection. The lowest  concentration of SARS-CoV-2 viral copies this assay can detect is 250  copies / mL. A negative result does not preclude SARS-CoV-2 infection  and should not be used as the sole basis for treatment or other  patient management decisions.  A negative result may occur with  improper specimen collection / handling, submission of specimen other  than nasopharyngeal swab, presence of viral mutation(s) within the  areas targeted by this assay, and inadequate number of viral copies  (<250 copies / mL). A negative result must be combined with clinical  observations, patient history, and epidemiological information. If result is POSITIVE SARS-CoV-2 target nucleic acids are DETECTED. The SARS-CoV-2 RNA is generally detectable in upper and lower  respiratory specimens dur ing the acute phase of infection.  Positive  results are indicative of active infection with SARS-CoV-2.  Clinical  correlation with patient history and other diagnostic information is  necessary to determine patient infection  status.  Positive results do  not rule out bacterial infection or co-infection with other viruses. If result is PRESUMPTIVE POSTIVE SARS-CoV-2 nucleic acids MAY BE PRESENT.   A presumptive positive result was obtained on the submitted specimen  and confirmed on repeat testing.  While  2019 novel coronavirus  (SARS-CoV-2) nucleic acids may be present in the submitted sample  additional confirmatory testing may be necessary for epidemiological  and / or clinical management purposes  to differentiate between  SARS-CoV-2 and other Sarbecovirus currently known to infect humans.  If clinically indicated additional testing with an alternate test  methodology [phone]) is advised. The SARS-CoV-2 RNA is generally  detectable in upper and lower respiratory sp ecimens during the acute  phase of infection. The expected result is Negative. Fact Sheet for Patients:  StrictlyIdeas.no Fact Sheet for Healthcare Providers: BankingDealers.co.za This test is not yet approved or cleared by the [place] FDA and has been authorized for detection and/or diagnosis of SARS-CoV-2 by FDA under an Emergency Use Authorization (EUA).  This EUA will remain in effect (meaning this test can be used) for the duration of the COVID-19 declaration under Section 564(b)(1) of the Act, 21 U.S.C. section 360bbb-3(b)(1), unless the authorization is terminated or revoked sooner. Performed at [facility], [address], [place], [place] [address]   CBC     Status: Abnormal   Collection Time: 04/24/19  1:54 AM  Result Value Ref Range   WBC 12.4 (H) 4.0 - 10.5 K/uL   RBC 4.26 3.87 - 5.11 MIL/uL   Hemoglobin 12.3 12.0 - 15.0 g/dL   HCT 38.6 36.0 - 46.0 %   MCV 90.6 80.0 - 100.0 fL   MCH 28.9 26.0 - 34.0 pg   MCHC 31.9 30.0 - 36.0 g/dL   RDW 14.3 11.5 - 15.5 %   Platelets 125 (L) 150 - 400 K/uL    Comment: Immature Platelet Fraction may be clinically indicated,  consider ordering this additional test [id number]    nRBC 0.0 0.0 - 0.2 %    Comment: Performed at [facility], [place] [address] [place], [place] [address]  Protime-INR     Status: Abnormal   Collection Time: 04/24/19  1:54 AM  Result Value Ref Range   Prothrombin Time 15.7 (H) 11.4 - 15.2 seconds   INR 1.3 (H) 0.8 - 1.2    Comment: (NOTE) INR goal varies based on device and disease states. Performed at [facility], [place] [address], [place], [place] [address]   APTT     Status: None   Collection Time: 04/24/19  1:54 AM  Result Value Ref Range   aPTT 31 24 - 36 seconds    Comment: Performed at [facility] [address] [place],  [address]  Comprehensive metabolic panel     Status: Abnormal   Collection Time: 04/24/19  1:54 AM  Result Value Ref Range   Sodium 135 135 - 145 mmol/L   Potassium 4.3 3.5 - 5.1 mmol/L   Chloride 104 98 - 111 mmol/L   CO2 21 (L) 22 - 32 mmol/L   Glucose, Bld 177 (H) 70 - 99 mg/dL   BUN 19 8 - 23 mg/dL   Creatinine, Ser 1.15 (H) 0.44 - 1.00 mg/dL   Calcium 8.5 (L) 8.9 - 10.3 mg/dL   Total Protein 6.3 (L) 6.5 - 8.1 g/dL   Albumin 3.1 (L) 3.5 - 5.0 g/dL   AST 26 15 - 41 U/L   ALT 20 0 - 44 U/L   Alkaline Phosphatase 51 38 - 126 U/L   Total Bilirubin 0.7 0.3 - 1.2 mg/dL   GFR calc non Af Amer 44 (L) >60 mL/min   GFR calc Af Amer 51 (L) >60 mL/min   Anion  gap 10 5 - 15    Comment: Performed at [facility], [place] [address] [place], [place] [address]  MRSA PCR Screening     Status: None   Collection Time: 04/24/19  2:51 AM   Specimen: Nasal Mucosa; Nasopharyngeal  Result Value Ref Range   MRSA by PCR NEGATIVE NEGATIVE    Comment:        The GeneXpert MRSA Assay (FDA approved for NASAL specimens only), is one component of a comprehensive MRSA colonization surveillance program. It is not intended to diagnose MRSA infection nor to guide or monitor treatment for MRSA infections. Performed at [facility], [place] [address] [place], [place] [address]   Protime-INR     Status: None   Collection Time: 04/24/19  7:07 AM  Result Value Ref Range   Prothrombin Time 15.2 11.4 - 15.2 seconds   INR 1.2 0.8 - 1.2    Comment: (NOTE) INR goal varies based on device and disease states. Performed at [facility], [place] [address] [place], [place] [address]     Ct Abdomen Pelvis Wo Contrast  Result Date: 04/23/2019 CLINICAL DATA:  Abdominal pain.  Trauma. EXAM: CT ABDOMEN AND PELVIS WITHOUT CONTRAST TECHNIQUE: Multidetector CT imaging of the abdomen and pelvis was performed following the standard protocol without IV contrast. COMPARISON:  CT chest dated July 14, 2013 FINDINGS: Lower chest: There are multiple scattered pulmonary nodules at the lung bases, measuring up to approximately 8 mm in the right lower lobe (axial series 4, image 3). These appear grossly similar in size and distribution when compared to CT from 2014. There is some bronchial wall thickening and mucus plugging at the lung bases bilaterally. There is significant cardiomegaly with biatrial enlargement. Hepatobiliary: The liver is normal. Normal gallbladder.The common bile duct is mildly dilated. Pancreas: The pancreatic duct is dilated without evidence for a discrete pancreatic mass. Spleen: No splenic laceration or hematoma. Adrenals/Urinary Tract: --Adrenal glands: No adrenal hemorrhage. --Right kidney/ureter: No hydronephrosis or perinephric hematoma. --Left kidney/ureter: No hydronephrosis or perinephric hematoma. --Urinary bladder: Unremarkable. Stomach/Bowel: --Stomach/Duodenum: There is a moderate to large hiatal hernia. There are surgical clips near the GE junction. The stomach is moderately distended. --Small bowel: There are dilated small bowel loops scattered throughout the abdomen without evidence for a distinct transition point. --Colon: There is a large amount of stool throughout the colon. --Appendix: Normal. Vascular/Lymphatic:  Atherosclerotic calcification is present within the non-aneurysmal abdominal aorta, without hemodynamically significant stenosis. --No retroperitoneal lymphadenopathy. --No mesenteric lymphadenopathy. --No pelvic or inguinal lymphadenopathy. Reproductive: Unremarkable Other: No ascites or free air. The abdominal wall is normal. Musculoskeletal. There is a comminuted fracture of the right acetabulum with a moderate size surrounding pelvic hematoma. One of the fracture planes extends into the superior pubic ramus on the right. There is a nondisplaced fracture involving the right inferior pubic ramus. IMPRESSION: 1. Comminuted fracture of the right acetabulum with a moderate sized surrounding pelvic hematoma. There is a nondisplaced fracture of the right inferior pubic ramus. 2. Dilated loops of small bowel without evidence for a distinct transition point. Findings may represent ileus or partial small bowel obstruction. 3. Significant cardiomegaly. 4. Large amount of stool throughout the colon. 5. Mildly dilated common bile duct and pancreatic duct without evidence for discrete pancreatic mass. Follow-up with a nonemergent outpatient MRCP is recommended for further evaluation of this finding. 6. Additional chronic findings as above. Electronically Signed   By: Constance Holster M.D.   On:  04/23/2019 22:06   Ct Head Wo Contrast  Result Date: 04/24/2019 CLINICAL DATA:  Fall EXAM: CT HEAD WITHOUT CONTRAST TECHNIQUE: Contiguous axial images were obtained from the base of the skull through the vertex without intravenous contrast. COMPARISON:  04/23/2019 FINDINGS: Brain: Small subdural hematoma along the right leaflet of the tentorium cerebelli, redistributed from the more lateral location on the prior study. No new site of hemorrhage. No increased blood volume. No midline shift or other mass effect. There is periventricular hypoattenuation compatible with chronic microvascular disease. Vascular: No abnormal  hyperdensity of the major intracranial arteries or dural venous sinuses. No intracranial atherosclerosis. Skull: Large right periorbital scalp hematoma. Redemonstration of lateral right orbital wall fracture. Sinuses/Orbits: Partial opacification of the right maxillary sinus. No mastoid or middle ear effusion. Bilateral lens replacements. IMPRESSION: Redistribution of small right-sided subdural hematoma, now layering along the right leaflet of the tentorium cerebelli. Electronically Signed   By: Ulyses Jarred M.D.   On: 04/24/2019 05:29   Ct Head Wo Contrast  Result Date: 04/23/2019 CLINICAL DATA:  Right facial swelling laceration following a fall. Sharp right neck pain and stiffness. EXAM: CT HEAD WITHOUT CONTRAST CT MAXILLOFACIAL WITHOUT CONTRAST CT CERVICAL SPINE WITHOUT CONTRAST TECHNIQUE: Multidetector CT imaging of the head, cervical spine, and maxillofacial structures were performed using the standard protocol without intravenous contrast. Multiplanar CT image reconstructions of the cervical spine and maxillofacial structures were also generated. COMPARISON:  Neck CT dated 07/30/2016 FINDINGS: CT HEAD FINDINGS Brain: Mildly enlarged ventricles and cortical sulci. Mild patchy white matter low density in both cerebral hemispheres. Small right lateral subdural hematoma measuring 4 mm in maximum thickness. 4 mm of midline shift to the left. No mass lesion or CT evidence of acute infarction. Vascular: No hyperdense vessel or unexpected calcification. Skull: Normal. Negative for fracture or focal lesion. Other: None. CT MAXILLOFACIAL FINDINGS Osseous: Mildly comminuted right lateral orbital wall fracture with minimal medial displacement and lateral angulation of the anterior fragment. No other fractures are seen. Orbits: Normal appearing intraorbital contents. Sinuses: Moderate right maxillary sinus mucosal thickening and surgical absence of portions of the medial walls of both maxillary sinuses. Soft tissues:  Right periorbital hematoma. CT CERVICAL SPINE FINDINGS Alignment: Normal. Skull base and vertebrae: No acute fracture. No primary bone lesion or focal pathologic process. Soft tissues and spinal canal: No prevertebral fluid or swelling. No visible canal hematoma. Disc levels:  Multilevel degenerative changes. Upper chest: Minimal biapical pleural and parenchymal scarring. Other: Bilateral carotid artery calcifications. IMPRESSION: 1. 4 mm right lateral subdural hematoma and 4 mm of midline shift to the left. 2. Mildly comminuted and minimally displaced right lateral orbital wall fracture. 3. Right periorbital hematoma. 4. No skull fracture. 5. No cervical spine fracture or subluxation. 6. Multilevel cervical spine degenerative changes. 7. Mild diffuse cerebral and cerebellar atrophy and mild chronic small vessel white matter ischemic changes in both cerebral hemispheres. 8. Bilateral carotid artery atheromatous calcifications. Critical Value/emergent results were called by telephone at the time of interpretation on 04/23/2019 at 10:11 pm to Dr. Milton Ferguson , who verbally acknowledged these results. Electronically Signed   By: Claudie Revering M.D.   On: 04/23/2019 22:16   Ct Cervical Spine Wo Contrast  Result Date: 04/23/2019 CLINICAL DATA:  Right facial swelling laceration following a fall. Sharp right neck pain and stiffness. EXAM: CT HEAD WITHOUT CONTRAST CT MAXILLOFACIAL WITHOUT CONTRAST CT CERVICAL SPINE WITHOUT CONTRAST TECHNIQUE: Multidetector CT imaging of the head, cervical spine, and maxillofacial structures were performed using the  standard protocol without intravenous contrast. Multiplanar CT image reconstructions of the cervical spine and maxillofacial structures were also generated. COMPARISON:  Neck CT dated 07/30/2016 FINDINGS: CT HEAD FINDINGS Brain: Mildly enlarged ventricles and cortical sulci. Mild patchy white matter low density in both cerebral hemispheres. Small right lateral subdural  hematoma measuring 4 mm in maximum thickness. 4 mm of midline shift to the left. No mass lesion or CT evidence of acute infarction. Vascular: No hyperdense vessel or unexpected calcification. Skull: Normal. Negative for fracture or focal lesion. Other: None. CT MAXILLOFACIAL FINDINGS Osseous: Mildly comminuted right lateral orbital wall fracture with minimal medial displacement and lateral angulation of the anterior fragment. No other fractures are seen. Orbits: Normal appearing intraorbital contents. Sinuses: Moderate right maxillary sinus mucosal thickening and surgical absence of portions of the medial walls of both maxillary sinuses. Soft tissues: Right periorbital hematoma. CT CERVICAL SPINE FINDINGS Alignment: Normal. Skull base and vertebrae: No acute fracture. No primary bone lesion or focal pathologic process. Soft tissues and spinal canal: No prevertebral fluid or swelling. No visible canal hematoma. Disc levels:  Multilevel degenerative changes. Upper chest: Minimal biapical pleural and parenchymal scarring. Other: Bilateral carotid artery calcifications. IMPRESSION: 1. 4 mm right lateral subdural hematoma and 4 mm of midline shift to the left. 2. Mildly comminuted and minimally displaced right lateral orbital wall fracture. 3. Right periorbital hematoma. 4. No skull fracture. 5. No cervical spine fracture or subluxation. 6. Multilevel cervical spine degenerative changes. 7. Mild diffuse cerebral and cerebellar atrophy and mild chronic small vessel white matter ischemic changes in both cerebral hemispheres. 8. Bilateral carotid artery atheromatous calcifications. Critical Value/emergent results were called by telephone at the time of interpretation on 04/23/2019 at 10:11 pm to Dr. Milton Ferguson , who verbally acknowledged these results. Electronically Signed   By: Claudie Revering M.D.   On: 04/23/2019 22:16   Ct Hip Right Wo Contrast  Result Date: 04/23/2019 CLINICAL DATA:  Pain EXAM: CT OF THE RIGHT HIP  WITHOUT CONTRAST TECHNIQUE: Multidetector CT imaging of the right hip was performed according to the standard protocol. Multiplanar CT image reconstructions were also generated. COMPARISON:  None. FINDINGS: Bones/Joint/Cartilage There is a comminuted acute fracture involving the right acetabulum, primarily involving the anterior column. There is a nondisplaced fracture involving the right inferior pubic ramus. A fracture plane extends through the right superior pubic ramus. There is no dislocation. There is no evidence for displaced fracture involving the proximal right femur Ligaments Suboptimally assessed by CT. Muscles and Tendons No acute abnormality detected. Soft tissues There is a moderate size right-sided pelvic hematoma. Detection of active extravasation is not possible on this noncontrast examination. IMPRESSION: 1. Acute comminuted fracture of the right acetabulum as detailed above. Nondisplaced fracture of the inferior pubic ramus on the right. 2. Moderate size right-sided pelvic hematoma. Electronically Signed   By: Constance Holster M.D.   On: 04/23/2019 22:09   Dg Hip Unilat With Pelvis 2-3 Views Right  Result Date: 04/24/2019 CLINICAL DATA:  Right acetabular fracture. EXAM: DG HIP (WITH OR WITHOUT PELVIS) 2-3V RIGHT COMPARISON:  None. FINDINGS: Minimally displaced fracture of the right acetabulum is seen. Fractures of the right superior and inferior pubic rami are also seen. No evidence of proximal femur fracture or hip dislocation. IMPRESSION: Right acetabular and pubic rami fractures. Electronically Signed   By: Marlaine Hind M.D.   On: 04/24/2019 06:55   Ct Maxillofacial Wo Contrast  Result Date: 04/23/2019 CLINICAL DATA:  Right facial swelling laceration following a  fall. Sharp right neck pain and stiffness. EXAM: CT HEAD WITHOUT CONTRAST CT MAXILLOFACIAL WITHOUT CONTRAST CT CERVICAL SPINE WITHOUT CONTRAST TECHNIQUE: Multidetector CT imaging of the head, cervical spine, and maxillofacial  structures were performed using the standard protocol without intravenous contrast. Multiplanar CT image reconstructions of the cervical spine and maxillofacial structures were also generated. COMPARISON:  Neck CT dated 07/30/2016 FINDINGS: CT HEAD FINDINGS Brain: Mildly enlarged ventricles and cortical sulci. Mild patchy white matter low density in both cerebral hemispheres. Small right lateral subdural hematoma measuring 4 mm in maximum thickness. 4 mm of midline shift to the left. No mass lesion or CT evidence of acute infarction. Vascular: No hyperdense vessel or unexpected calcification. Skull: Normal. Negative for fracture or focal lesion. Other: None. CT MAXILLOFACIAL FINDINGS Osseous: Mildly comminuted right lateral orbital wall fracture with minimal medial displacement and lateral angulation of the anterior fragment. No other fractures are seen. Orbits: Normal appearing intraorbital contents. Sinuses: Moderate right maxillary sinus mucosal thickening and surgical absence of portions of the medial walls of both maxillary sinuses. Soft tissues: Right periorbital hematoma. CT CERVICAL SPINE FINDINGS Alignment: Normal. Skull base and vertebrae: No acute fracture. No primary bone lesion or focal pathologic process. Soft tissues and spinal canal: No prevertebral fluid or swelling. No visible canal hematoma. Disc levels:  Multilevel degenerative changes. Upper chest: Minimal biapical pleural and parenchymal scarring. Other: Bilateral carotid artery calcifications. IMPRESSION: 1. 4 mm right lateral subdural hematoma and 4 mm of midline shift to the left. 2. Mildly comminuted and minimally displaced right lateral orbital wall fracture. 3. Right periorbital hematoma. 4. No skull fracture. 5. No cervical spine fracture or subluxation. 6. Multilevel cervical spine degenerative changes. 7. Mild diffuse cerebral and cerebellar atrophy and mild chronic small vessel white matter ischemic changes in both cerebral  hemispheres. 8. Bilateral carotid artery atheromatous calcifications. Critical Value/emergent results were called by telephone at the time of interpretation on 04/23/2019 at 10:11 pm to Dr. Milton Ferguson , who verbally acknowledged these results. Electronically Signed   By: Claudie Revering M.D.   On: 04/23/2019 22:16    Review of Systems  Musculoskeletal: Positive for joint pain.  All other systems reviewed and are negative.  Blood pressure 99/69, pulse (!) 108, temperature 98.1 F (36.7 C), temperature source Oral, resp. rate 12, height 5\' 6"  (1.676 m), weight 69.6 kg, SpO2 97 %. Physical Exam  Constitutional: She is oriented to person, place, and time. She appears well-developed and well-nourished. No distress.  HENT:  Right Ear: External ear normal.  Left Ear: External ear normal.  Nose: Nose normal.  Mouth/Throat: Oropharynx is clear and moist.  Eyes:  Marked right periorbital edema and ecchymosis.  Right eye closed.  With eye held open, extraocular movements intact.  Tender right lateral orbital rim.  Neck: Normal range of motion. Neck supple.  Cardiovascular: Normal rate.  Respiratory: Effort normal.  Neurological: She is alert and oriented to person, place, and time. No cranial nerve deficit.  Skin: Skin is warm and dry.  Psychiatric: She has a normal mood and affect. Her behavior is normal. Judgment and thought content normal.    Assessment/Plan: Right lateral orbital wall fracture  I personally reviewed her maxillofacial CT that demonstrates a minimally displaced right lateral orbital wall fracture.  Extraocular movements are normal.  There is no orbital hematoma.  She needs no intervention.  Melida Quitter 04/24/2019, 11:45 AM

## 2019-04-24 NOTE — Progress Notes (Signed)
   Subjective:    Recheck right hip s/p acetabulum fracture from a fall C/o mild soreness in the right hip but no new symptoms Denies any numbness or tingling distally Neurosurgery following closely  Patient reports pain as mild.  Objective:   VITALS:   Vitals:   04/24/19 0700 04/24/19 0800  BP: 106/70 92/80  Pulse: (!) 121 (!) 107  Resp: 18 18  Temp:    SpO2: 96% 96%    Right lower extremity: no pain with log roll of right lower extremity nv intact distally bilateral lower extremities No rashes or edema distally Large ecchymosis to right eye and eye is swollen shut  LABS Recent Labs    04/23/19 2216 04/24/19 0154  HGB 12.6 12.3  HCT 40.1 38.6  WBC 12.0* 12.4*  PLT 122* 125*    Recent Labs    04/23/19 2216 04/24/19 0154  NA 139 135  K 4.0 4.3  BUN 22 19  CREATININE 1.22* 1.15*  GLUCOSE 141* 177*     Assessment/Plan:   right acetabulum fracture  Currently pt is stable from orthopedic standpoint Will continue to monitor her progress Pain management as needed     Kathrynn Speed, [place] is now [employer] [facility] [address] [place], [place], [place] [address] Phone: [phone] [email or web address] Facebook  [employer]

## 2019-04-24 NOTE — H&P (Signed)
[name] is an 85 y.o. female.   Chief Complaint: Fall HPI: [facility].  She fell over her dog.  She fell onto her right side.  She was worked up extensively in any pain and found to have a nondisplaced right orbital wall fracture, right acetabular fracture, very small subdural hematoma and right inferior rami fracture.  She is on Coumadin for chronic atrial fibrillation.  She received Kcentra prior to transfer.  She has remained hemodynamically stable.  Her mental status is clear and she has no major neurological deficits at this point time.  Past Medical History:  Diagnosis Date  . Asthma   . Atrial fibrillation ([place])   . Chronic rhinitis   . COPD (chronic obstructive pulmonary disease) ([place])   . GERD (gastroesophageal reflux disease)   . Mitral regurgitation   . Mitral valve prolapse   . Nasal polyps   . Nonischemic cardiomyopathy ([facility])    LVEF 25-30% improved to 45-50%  . Obstructive sleep apnea   . Transudative pleural effusion     Past Surgical History:  Procedure Laterality Date  . APPENDECTOMY  2000  . Bilateral cataract surgery    . LAPAROSCOPIC NISSEN FUNDOPLICATION  3151  . NOSE SURGERY      Family History  Problem Relation Age of Onset  . Cancer Mother        Colon  . Cancer Father        Colon  . Atrial fibrillation Sister   . Cancer Brother        Lung  . Cancer Brother        Lung   Social History:  reports that she quit smoking about 38 years ago. Her smoking use included cigarettes. She has a 10.00 pack-year smoking history. She has never used smokeless tobacco. She reports that she does not drink alcohol or use drugs.  Allergies:  Allergies  Allergen Reactions  . Amoxicillin-Pot Clavulanate Itching and Swelling  . Clarithromycin Itching and Swelling  . Clindamycin Itching and Swelling  . Doxycycline Itching and Swelling  . Penicillins Itching and Swelling    Did it involve swelling  of the face/tongue/throat, SOB, or low BP? Yes Did it involve sudden or severe rash/hives, skin peeling, or any reaction on the inside of your mouth or nose? No Did you need to seek medical attention at a hospital or doctor's office? Yes When did it last happen?Over 10 years If all above answers are "NO", may proceed with cephalosporin use.     (Not in a hospital admission)   Results for orders placed or performed during the hospital encounter of 04/23/19 (from the past 48 hour(s))  CBC with Differential/Platelet     Status: Abnormal   Collection Time: 04/23/19 10:16 PM  Result Value Ref Range   WBC 12.0 (H) 4.0 - 10.5 K/uL   RBC 4.48 3.87 - 5.11 MIL/uL   Hemoglobin 12.6 12.0 - 15.0 g/dL   HCT 40.1 36.0 - 46.0 %   MCV 89.5 80.0 - 100.0 fL   MCH 28.1 26.0 - 34.0 pg   MCHC 31.4 30.0 - 36.0 g/dL   RDW 14.3 11.5 - 15.5 %   Platelets 122 (L) 150 - 400 K/uL   nRBC 0.0 0.0 - 0.2 %   Neutrophils Relative % 85 %   Neutro Abs 10.3 (H) 1.7 - 7.7 K/uL   Lymphocytes Relative 7 %   Lymphs Abs 0.8 0.7 - 4.0  K/uL   Monocytes Relative 6 %   Monocytes Absolute 0.7 0.1 - 1.0 K/uL   Eosinophils Relative 1 %   Eosinophils Absolute 0.1 0.0 - 0.5 K/uL   Basophils Relative 0 %   Basophils Absolute 0.0 0.0 - 0.1 K/uL   Immature Granulocytes 1 %   Abs Immature Granulocytes 0.07 0.00 - 0.07 K/uL    Comment: Performed at [facility], [address], [place], [place] [address]  Comprehensive metabolic panel     Status: Abnormal   Collection Time: 04/23/19 10:16 PM  Result Value Ref Range   Sodium 139 135 - 145 mmol/L   Potassium 4.0 3.5 - 5.1 mmol/L   Chloride 107 98 - 111 mmol/L   CO2 25 22 - 32 mmol/L   Glucose, Bld 141 (H) 70 - 99 mg/dL   BUN 22 8 - 23 mg/dL   Creatinine, Ser 1.22 (H) 0.44 - 1.00 mg/dL   Calcium 8.8 (L) 8.9 - 10.3 mg/dL   Total Protein 6.7 6.5 - 8.1 g/dL   Albumin 3.4 (L) 3.5 - 5.0 g/dL   AST 32 15 - 41 U/L   ALT 23 0 - 44 U/L   Alkaline Phosphatase 58 38 - 126 U/L    Total Bilirubin 0.5 0.3 - 1.2 mg/dL   GFR calc non Af Amer 41 (L) >60 mL/min   GFR calc Af Amer 48 (L) >60 mL/min   Anion gap 7 5 - 15    Comment: Performed at [facility], [address], [place], [place] [address]  Protime-INR     Status: Abnormal   Collection Time: 04/23/19 10:16 PM  Result Value Ref Range   Prothrombin Time 27.9 (H) 11.4 - 15.2 seconds   INR 2.7 (H) 0.8 - 1.2    Comment: (NOTE) INR goal varies based on device and disease states. Performed at [facility], [address], [place], [place] [address]   SARS Coronavirus 2 [facility] order, Performed in [facility] hospital lab) Nasopharyngeal Nasopharyngeal Swab     Status: None   Collection Time: 04/23/19 11:09 PM   Specimen: Nasopharyngeal Swab  Result Value Ref Range   SARS Coronavirus 2 NEGATIVE NEGATIVE    Comment: (NOTE) If result is NEGATIVE SARS-CoV-2 target nucleic acids are NOT DETECTED. The SARS-CoV-2 RNA is generally detectable in upper and lower  respiratory specimens during the acute phase of infection. The lowest  concentration of SARS-CoV-2 viral copies this assay can detect is 250  copies / mL. A negative result does not preclude SARS-CoV-2 infection  and should not be used as the sole basis for treatment or other  patient management decisions.  A negative result may occur with  improper specimen collection / handling, submission of specimen other  than nasopharyngeal swab, presence of viral mutation(s) within the  areas targeted by this assay, and inadequate number of viral copies  (<250 copies / mL). A negative result must be combined with clinical  observations, patient history, and epidemiological information. If result is POSITIVE SARS-CoV-2 target nucleic acids are DETECTED. The SARS-CoV-2 RNA is generally detectable in upper and lower  respiratory specimens dur ing the acute phase of infection.  Positive  results are indicative of active infection with SARS-CoV-2.  Clinical  correlation  with patient history and other diagnostic information is  necessary to determine patient infection status.  Positive results do  not rule out bacterial infection or co-infection with other viruses. If result is PRESUMPTIVE POSTIVE SARS-CoV-2 nucleic acids MAY BE PRESENT.  A presumptive positive result was obtained on the submitted specimen  and confirmed on repeat testing.  While 2019 novel coronavirus  (SARS-CoV-2) nucleic acids may be present in the submitted sample  additional confirmatory testing may be necessary for epidemiological  and / or clinical management purposes  to differentiate between  SARS-CoV-2 and other Sarbecovirus currently known to infect humans.  If clinically indicated additional testing with an alternate test  methodology [phone]) is advised. The SARS-CoV-2 RNA is generally  detectable in upper and lower respiratory sp ecimens during the acute  phase of infection. The expected result is Negative. Fact Sheet for Patients:  StrictlyIdeas.no Fact Sheet for Healthcare Providers: BankingDealers.co.za This test is not yet approved or cleared by the [place] FDA and has been authorized for detection and/or diagnosis of SARS-CoV-2 by FDA under an Emergency Use Authorization (EUA).  This EUA will remain in effect (meaning this test can be used) for the duration of the COVID-19 declaration under Section 564(b)(1) of the Act, 21 U.S.C. section 360bbb-3(b)(1), unless the authorization is terminated or revoked sooner. Performed at [facility], [address], [place], [place] [address]    Ct Abdomen Pelvis Wo Contrast  Result Date: 04/23/2019 CLINICAL DATA:  Abdominal pain.  Trauma. EXAM: CT ABDOMEN AND PELVIS WITHOUT CONTRAST TECHNIQUE: Multidetector CT imaging of the abdomen and pelvis was performed following the standard protocol without IV contrast. COMPARISON:  CT chest dated July 14, 2013 FINDINGS: Lower chest:  There are multiple scattered pulmonary nodules at the lung bases, measuring up to approximately 8 mm in the right lower lobe (axial series 4, image 3). These appear grossly similar in size and distribution when compared to CT from 2014. There is some bronchial wall thickening and mucus plugging at the lung bases bilaterally. There is significant cardiomegaly with biatrial enlargement. Hepatobiliary: The liver is normal. Normal gallbladder.The common bile duct is mildly dilated. Pancreas: The pancreatic duct is dilated without evidence for a discrete pancreatic mass. Spleen: No splenic laceration or hematoma. Adrenals/Urinary Tract: --Adrenal glands: No adrenal hemorrhage. --Right kidney/ureter: No hydronephrosis or perinephric hematoma. --Left kidney/ureter: No hydronephrosis or perinephric hematoma. --Urinary bladder: Unremarkable. Stomach/Bowel: --Stomach/Duodenum: There is a moderate to large hiatal hernia. There are surgical clips near the GE junction. The stomach is moderately distended. --Small bowel: There are dilated small bowel loops scattered throughout the abdomen without evidence for a distinct transition point. --Colon: There is a large amount of stool throughout the colon. --Appendix: Normal. Vascular/Lymphatic: Atherosclerotic calcification is present within the non-aneurysmal abdominal aorta, without hemodynamically significant stenosis. --No retroperitoneal lymphadenopathy. --No mesenteric lymphadenopathy. --No pelvic or inguinal lymphadenopathy. Reproductive: Unremarkable Other: No ascites or free air. The abdominal wall is normal. Musculoskeletal. There is a comminuted fracture of the right acetabulum with a moderate size surrounding pelvic hematoma. One of the fracture planes extends into the superior pubic ramus on the right. There is a nondisplaced fracture involving the right inferior pubic ramus. IMPRESSION: 1. Comminuted fracture of the right acetabulum with a moderate sized surrounding  pelvic hematoma. There is a nondisplaced fracture of the right inferior pubic ramus. 2. Dilated loops of small bowel without evidence for a distinct transition point. Findings may represent ileus or partial small bowel obstruction. 3. Significant cardiomegaly. 4. Large amount of stool throughout the colon. 5. Mildly dilated common bile duct and pancreatic duct without evidence for discrete pancreatic mass. Follow-up with a nonemergent outpatient MRCP is recommended for further evaluation of this finding. 6. Additional chronic findings as above. Electronically Signed  By: Constance Holster M.D.   On: 04/23/2019 22:06   Ct Head Wo Contrast  Result Date: 04/23/2019 CLINICAL DATA:  Right facial swelling laceration following a fall. Sharp right neck pain and stiffness. EXAM: CT HEAD WITHOUT CONTRAST CT MAXILLOFACIAL WITHOUT CONTRAST CT CERVICAL SPINE WITHOUT CONTRAST TECHNIQUE: Multidetector CT imaging of the head, cervical spine, and maxillofacial structures were performed using the standard protocol without intravenous contrast. Multiplanar CT image reconstructions of the cervical spine and maxillofacial structures were also generated. COMPARISON:  Neck CT dated 07/30/2016 FINDINGS: CT HEAD FINDINGS Brain: Mildly enlarged ventricles and cortical sulci. Mild patchy white matter low density in both cerebral hemispheres. Small right lateral subdural hematoma measuring 4 mm in maximum thickness. 4 mm of midline shift to the left. No mass lesion or CT evidence of acute infarction. Vascular: No hyperdense vessel or unexpected calcification. Skull: Normal. Negative for fracture or focal lesion. Other: None. CT MAXILLOFACIAL FINDINGS Osseous: Mildly comminuted right lateral orbital wall fracture with minimal medial displacement and lateral angulation of the anterior fragment. No other fractures are seen. Orbits: Normal appearing intraorbital contents. Sinuses: Moderate right maxillary sinus mucosal thickening and  surgical absence of portions of the medial walls of both maxillary sinuses. Soft tissues: Right periorbital hematoma. CT CERVICAL SPINE FINDINGS Alignment: Normal. Skull base and vertebrae: No acute fracture. No primary bone lesion or focal pathologic process. Soft tissues and spinal canal: No prevertebral fluid or swelling. No visible canal hematoma. Disc levels:  Multilevel degenerative changes. Upper chest: Minimal biapical pleural and parenchymal scarring. Other: Bilateral carotid artery calcifications. IMPRESSION: 1. 4 mm right lateral subdural hematoma and 4 mm of midline shift to the left. 2. Mildly comminuted and minimally displaced right lateral orbital wall fracture. 3. Right periorbital hematoma. 4. No skull fracture. 5. No cervical spine fracture or subluxation. 6. Multilevel cervical spine degenerative changes. 7. Mild diffuse cerebral and cerebellar atrophy and mild chronic small vessel white matter ischemic changes in both cerebral hemispheres. 8. Bilateral carotid artery atheromatous calcifications. Critical Value/emergent results were called by telephone at the time of interpretation on 04/23/2019 at 10:11 pm to Dr. Milton Ferguson , who verbally acknowledged these results. Electronically Signed   By: Claudie Revering M.D.   On: 04/23/2019 22:16   Ct Cervical Spine Wo Contrast  Result Date: 04/23/2019 CLINICAL DATA:  Right facial swelling laceration following a fall. Sharp right neck pain and stiffness. EXAM: CT HEAD WITHOUT CONTRAST CT MAXILLOFACIAL WITHOUT CONTRAST CT CERVICAL SPINE WITHOUT CONTRAST TECHNIQUE: Multidetector CT imaging of the head, cervical spine, and maxillofacial structures were performed using the standard protocol without intravenous contrast. Multiplanar CT image reconstructions of the cervical spine and maxillofacial structures were also generated. COMPARISON:  Neck CT dated 07/30/2016 FINDINGS: CT HEAD FINDINGS Brain: Mildly enlarged ventricles and cortical sulci. Mild patchy  white matter low density in both cerebral hemispheres. Small right lateral subdural hematoma measuring 4 mm in maximum thickness. 4 mm of midline shift to the left. No mass lesion or CT evidence of acute infarction. Vascular: No hyperdense vessel or unexpected calcification. Skull: Normal. Negative for fracture or focal lesion. Other: None. CT MAXILLOFACIAL FINDINGS Osseous: Mildly comminuted right lateral orbital wall fracture with minimal medial displacement and lateral angulation of the anterior fragment. No other fractures are seen. Orbits: Normal appearing intraorbital contents. Sinuses: Moderate right maxillary sinus mucosal thickening and surgical absence of portions of the medial walls of both maxillary sinuses. Soft tissues: Right periorbital hematoma. CT CERVICAL SPINE FINDINGS Alignment: Normal. Skull base and  vertebrae: No acute fracture. No primary bone lesion or focal pathologic process. Soft tissues and spinal canal: No prevertebral fluid or swelling. No visible canal hematoma. Disc levels:  Multilevel degenerative changes. Upper chest: Minimal biapical pleural and parenchymal scarring. Other: Bilateral carotid artery calcifications. IMPRESSION: 1. 4 mm right lateral subdural hematoma and 4 mm of midline shift to the left. 2. Mildly comminuted and minimally displaced right lateral orbital wall fracture. 3. Right periorbital hematoma. 4. No skull fracture. 5. No cervical spine fracture or subluxation. 6. Multilevel cervical spine degenerative changes. 7. Mild diffuse cerebral and cerebellar atrophy and mild chronic small vessel white matter ischemic changes in both cerebral hemispheres. 8. Bilateral carotid artery atheromatous calcifications. Critical Value/emergent results were called by telephone at the time of interpretation on 04/23/2019 at 10:11 pm to Dr. Milton Ferguson , who verbally acknowledged these results. Electronically Signed   By: Claudie Revering M.D.   On: 04/23/2019 22:16   Ct Hip Right Wo  Contrast  Result Date: 04/23/2019 CLINICAL DATA:  Pain EXAM: CT OF THE RIGHT HIP WITHOUT CONTRAST TECHNIQUE: Multidetector CT imaging of the right hip was performed according to the standard protocol. Multiplanar CT image reconstructions were also generated. COMPARISON:  None. FINDINGS: Bones/Joint/Cartilage There is a comminuted acute fracture involving the right acetabulum, primarily involving the anterior column. There is a nondisplaced fracture involving the right inferior pubic ramus. A fracture plane extends through the right superior pubic ramus. There is no dislocation. There is no evidence for displaced fracture involving the proximal right femur Ligaments Suboptimally assessed by CT. Muscles and Tendons No acute abnormality detected. Soft tissues There is a moderate size right-sided pelvic hematoma. Detection of active extravasation is not possible on this noncontrast examination. IMPRESSION: 1. Acute comminuted fracture of the right acetabulum as detailed above. Nondisplaced fracture of the inferior pubic ramus on the right. 2. Moderate size right-sided pelvic hematoma. Electronically Signed   By: Constance Holster M.D.   On: 04/23/2019 22:09   Ct Maxillofacial Wo Contrast  Result Date: 04/23/2019 CLINICAL DATA:  Right facial swelling laceration following a fall. Sharp right neck pain and stiffness. EXAM: CT HEAD WITHOUT CONTRAST CT MAXILLOFACIAL WITHOUT CONTRAST CT CERVICAL SPINE WITHOUT CONTRAST TECHNIQUE: Multidetector CT imaging of the head, cervical spine, and maxillofacial structures were performed using the standard protocol without intravenous contrast. Multiplanar CT image reconstructions of the cervical spine and maxillofacial structures were also generated. COMPARISON:  Neck CT dated 07/30/2016 FINDINGS: CT HEAD FINDINGS Brain: Mildly enlarged ventricles and cortical sulci. Mild patchy white matter low density in both cerebral hemispheres. Small right lateral subdural hematoma measuring  4 mm in maximum thickness. 4 mm of midline shift to the left. No mass lesion or CT evidence of acute infarction. Vascular: No hyperdense vessel or unexpected calcification. Skull: Normal. Negative for fracture or focal lesion. Other: None. CT MAXILLOFACIAL FINDINGS Osseous: Mildly comminuted right lateral orbital wall fracture with minimal medial displacement and lateral angulation of the anterior fragment. No other fractures are seen. Orbits: Normal appearing intraorbital contents. Sinuses: Moderate right maxillary sinus mucosal thickening and surgical absence of portions of the medial walls of both maxillary sinuses. Soft tissues: Right periorbital hematoma. CT CERVICAL SPINE FINDINGS Alignment: Normal. Skull base and vertebrae: No acute fracture. No primary bone lesion or focal pathologic process. Soft tissues and spinal canal: No prevertebral fluid or swelling. No visible canal hematoma. Disc levels:  Multilevel degenerative changes. Upper chest: Minimal biapical pleural and parenchymal scarring. Other: Bilateral carotid artery calcifications. IMPRESSION: 1.  4 mm right lateral subdural hematoma and 4 mm of midline shift to the left. 2. Mildly comminuted and minimally displaced right lateral orbital wall fracture. 3. Right periorbital hematoma. 4. No skull fracture. 5. No cervical spine fracture or subluxation. 6. Multilevel cervical spine degenerative changes. 7. Mild diffuse cerebral and cerebellar atrophy and mild chronic small vessel white matter ischemic changes in both cerebral hemispheres. 8. Bilateral carotid artery atheromatous calcifications. Critical Value/emergent results were called by telephone at the time of interpretation on 04/23/2019 at 10:11 pm to Dr. Milton Ferguson , who verbally acknowledged these results. Electronically Signed   By: Claudie Revering M.D.   On: 04/23/2019 22:16    Review of Systems  Constitutional: Negative.     Blood pressure 113/85, pulse (!) 105, temperature (!) 97.5 F  (36.4 C), temperature source Oral, resp. rate 20, height 5\' 6"  (1.676 m), weight 67.1 kg, SpO2 100 %. Physical Exam  Constitutional: She is oriented to person, place, and time. She appears well-developed.  HENT:  Right eyelid and right eye hematoma  Eyes: Pupils are equal, round, and reactive to light.  Unable to open right eye due to swelling  Neck: Normal range of motion. Neck supple.  Cardiovascular:  A. fib rate 109  Respiratory: Effort normal and breath sounds normal.  GI: Soft. Bowel sounds are normal. There is no abdominal tenderness.  Musculoskeletal:     Comments: Right hip swelling  Neurological: She is alert and oriented to person, place, and time. She has normal strength. No sensory deficit. GCS eye subscore is 4. GCS verbal subscore is 5. GCS motor subscore is 6.  Psychiatric: She has a normal mood and affect. Her speech is normal and behavior is normal.     Assessment/Plan Fall  Right acetabular fracture-orthopedics has been notified  Small subdural hematoma-neurosurgery is been notified  Right orbital wall fracture-will require ENT consultation  Admit to ICU for serial hemoglobins and monitoring  Follow PT/INR  Turner Daniels, MD 04/24/2019, 1:19 AM

## 2019-04-24 NOTE — Consult Note (Signed)
Reason for Consult:sdh Referring Physician: edp  [name] is an 85 y.o. female.   HPI:  Very pleasant 85 year old female that presented to [facility] last night after sustaining a fall. She states that she has two dogs at home and was trying to prevent them from going outside when they tripped her and she fell hitting her head. She is on coumadin for a.fib. Denies any headaches, nv, or vision changes.   Past Medical History:  Diagnosis Date  . Asthma   . Atrial fibrillation ([place])   . Chronic rhinitis   . COPD (chronic obstructive pulmonary disease) ([place])   . GERD (gastroesophageal reflux disease)   . Mitral regurgitation   . Mitral valve prolapse   . Nasal polyps   . Nonischemic cardiomyopathy ([facility])    LVEF 25-30% improved to 45-50%  . Obstructive sleep apnea   . Transudative pleural effusion     Past Surgical History:  Procedure Laterality Date  . APPENDECTOMY  2000  . Bilateral cataract surgery    . LAPAROSCOPIC NISSEN FUNDOPLICATION  7564  . NOSE SURGERY      Allergies  Allergen Reactions  . Amoxicillin-Pot Clavulanate Itching and Swelling  . Clarithromycin Itching and Swelling  . Clindamycin Itching and Swelling  . Doxycycline Itching and Swelling  . Penicillins Itching and Swelling    Did it involve swelling of the face/tongue/throat, SOB, or low BP? Yes Did it involve sudden or severe rash/hives, skin peeling, or any reaction on the inside of your mouth or nose? No Did you need to seek medical attention at a hospital or doctor's office? Yes When did it last happen?Over 10 years If all above answers are "NO", may proceed with cephalosporin use.     Social History   Tobacco Use  . Smoking status: Former Smoker    Packs/day: 0.50    Years: 20.00    Pack years: 10.00    Types: Cigarettes    Quit date: 10/08/1980    Years since quitting: 38.5  . Smokeless tobacco: Never Used  Substance Use Topics  . Alcohol use: No    Alcohol/week: 0.0 standard drinks     Family History  Problem Relation Age of Onset  . Cancer Mother        Colon  . Cancer Father        Colon  . Atrial fibrillation Sister   . Cancer Brother        Lung  . Cancer Brother        Lung     Review of Systems  Positive ROS: as above  All other systems have been reviewed and were otherwise negative with the exception of those mentioned in the HPI and as above.  Objective: Vital signs in last 24 hours: Temp:  [97.5 F (36.4 C)-97.6 F (36.4 C)] 97.5 F (36.4 C) (08/16 0044) Pulse Rate:  [57-125] 111 (08/16 0145) Resp:  [17-21] 21 (08/16 0145) BP: (94-124)/(56-85) 106/60 (08/16 0145) SpO2:  [87 %-100 %] 92 % (08/16 0145) Weight:  [67.1 kg] 67.1 kg (08/15 2056)  General Appearance: Alert, cooperative, no distress, appears stated age Head: Normocephalic, without obvious abnormality, atraumatic Eyes: PERRL, conjunctiva/corneas clear, EOM's intact, fundi benign, both eyes, right eye edematous and erythemic      Lungs: Clear to auscultation bilaterally Heart: Regular rate and rhythm Extremities: Extremities normal, atraumatic, no cyanosis or edema Pulses: 2+ and symmetric all extremities Skin: Skin color, texture, turgor normal, no rashes or lesions  NEUROLOGIC:  Mental status: A&O x4, no aphasia, good attention span, Memory and fund of knowledge Motor Exam - grossly normal, normal tone and bulk Sensory Exam - grossly normal Reflexes: symmetric, no pathologic reflexes, No Hoffman's, No clonus Coordination - not tested Gait - not tested Balance - not tested Cranial Nerves: I: smell Not tested  II: visual acuity  OS: na    OD: na  II: visual fields Full to confrontation  II: pupils Equal, round, reactive to light, unable to view right pupil bc swollen shut  III,VII: ptosis None  III,IV,VI: extraocular muscles  Full ROM  V: mastication Normal  V: facial light touch sensation  Normal  V,VII: corneal reflex  Present  VII: facial muscle function - upper   Normal  VII: facial muscle function - lower Normal  VIII: hearing Not tested  IX: soft palate elevation  Normal  IX,X: gag reflex Present  XI: trapezius strength  5/5  XI: sternocleidomastoid strength 5/5  XI: neck flexion strength  5/5  XII: tongue strength  Normal    Data Review Lab Results  Component Value Date   WBC 12.0 (H) 04/23/2019   HGB 12.6 04/23/2019   HCT 40.1 04/23/2019   MCV 89.5 04/23/2019   PLT 122 (L) 04/23/2019   Lab Results  Component Value Date   NA 139 04/23/2019   K 4.0 04/23/2019   CL 107 04/23/2019   CO2 25 04/23/2019   BUN 22 04/23/2019   CREATININE 1.22 (H) 04/23/2019   GLUCOSE 141 (H) 04/23/2019   Lab Results  Component Value Date   INR 2.7 (H) 04/23/2019    Radiology: Ct Abdomen Pelvis Wo Contrast  Result Date: 04/23/2019 CLINICAL DATA:  Abdominal pain.  Trauma. EXAM: CT ABDOMEN AND PELVIS WITHOUT CONTRAST TECHNIQUE: Multidetector CT imaging of the abdomen and pelvis was performed following the standard protocol without IV contrast. COMPARISON:  CT chest dated July 14, 2013 FINDINGS: Lower chest: There are multiple scattered pulmonary nodules at the lung bases, measuring up to approximately 8 mm in the right lower lobe (axial series 4, image 3). These appear grossly similar in size and distribution when compared to CT from 2014. There is some bronchial wall thickening and mucus plugging at the lung bases bilaterally. There is significant cardiomegaly with biatrial enlargement. Hepatobiliary: The liver is normal. Normal gallbladder.The common bile duct is mildly dilated. Pancreas: The pancreatic duct is dilated without evidence for a discrete pancreatic mass. Spleen: No splenic laceration or hematoma. Adrenals/Urinary Tract: --Adrenal glands: No adrenal hemorrhage. --Right kidney/ureter: No hydronephrosis or perinephric hematoma. --Left kidney/ureter: No hydronephrosis or perinephric hematoma. --Urinary bladder: Unremarkable. Stomach/Bowel:  --Stomach/Duodenum: There is a moderate to large hiatal hernia. There are surgical clips near the GE junction. The stomach is moderately distended. --Small bowel: There are dilated small bowel loops scattered throughout the abdomen without evidence for a distinct transition point. --Colon: There is a large amount of stool throughout the colon. --Appendix: Normal. Vascular/Lymphatic: Atherosclerotic calcification is present within the non-aneurysmal abdominal aorta, without hemodynamically significant stenosis. --No retroperitoneal lymphadenopathy. --No mesenteric lymphadenopathy. --No pelvic or inguinal lymphadenopathy. Reproductive: Unremarkable Other: No ascites or free air. The abdominal wall is normal. Musculoskeletal. There is a comminuted fracture of the right acetabulum with a moderate size surrounding pelvic hematoma. One of the fracture planes extends into the superior pubic ramus on the right. There is a nondisplaced fracture involving the right inferior pubic ramus. IMPRESSION: 1. Comminuted fracture of the right acetabulum with a moderate sized surrounding pelvic hematoma. There  is a nondisplaced fracture of the right inferior pubic ramus. 2. Dilated loops of small bowel without evidence for a distinct transition point. Findings may represent ileus or partial small bowel obstruction. 3. Significant cardiomegaly. 4. Large amount of stool throughout the colon. 5. Mildly dilated common bile duct and pancreatic duct without evidence for discrete pancreatic mass. Follow-up with a nonemergent outpatient MRCP is recommended for further evaluation of this finding. 6. Additional chronic findings as above. Electronically Signed   By: Constance Holster M.D.   On: 04/23/2019 22:06   Ct Head Wo Contrast  Result Date: 04/23/2019 CLINICAL DATA:  Right facial swelling laceration following a fall. Sharp right neck pain and stiffness. EXAM: CT HEAD WITHOUT CONTRAST CT MAXILLOFACIAL WITHOUT CONTRAST CT CERVICAL SPINE  WITHOUT CONTRAST TECHNIQUE: Multidetector CT imaging of the head, cervical spine, and maxillofacial structures were performed using the standard protocol without intravenous contrast. Multiplanar CT image reconstructions of the cervical spine and maxillofacial structures were also generated. COMPARISON:  Neck CT dated 07/30/2016 FINDINGS: CT HEAD FINDINGS Brain: Mildly enlarged ventricles and cortical sulci. Mild patchy white matter low density in both cerebral hemispheres. Small right lateral subdural hematoma measuring 4 mm in maximum thickness. 4 mm of midline shift to the left. No mass lesion or CT evidence of acute infarction. Vascular: No hyperdense vessel or unexpected calcification. Skull: Normal. Negative for fracture or focal lesion. Other: None. CT MAXILLOFACIAL FINDINGS Osseous: Mildly comminuted right lateral orbital wall fracture with minimal medial displacement and lateral angulation of the anterior fragment. No other fractures are seen. Orbits: Normal appearing intraorbital contents. Sinuses: Moderate right maxillary sinus mucosal thickening and surgical absence of portions of the medial walls of both maxillary sinuses. Soft tissues: Right periorbital hematoma. CT CERVICAL SPINE FINDINGS Alignment: Normal. Skull base and vertebrae: No acute fracture. No primary bone lesion or focal pathologic process. Soft tissues and spinal canal: No prevertebral fluid or swelling. No visible canal hematoma. Disc levels:  Multilevel degenerative changes. Upper chest: Minimal biapical pleural and parenchymal scarring. Other: Bilateral carotid artery calcifications. IMPRESSION: 1. 4 mm right lateral subdural hematoma and 4 mm of midline shift to the left. 2. Mildly comminuted and minimally displaced right lateral orbital wall fracture. 3. Right periorbital hematoma. 4. No skull fracture. 5. No cervical spine fracture or subluxation. 6. Multilevel cervical spine degenerative changes. 7. Mild diffuse cerebral and  cerebellar atrophy and mild chronic small vessel white matter ischemic changes in both cerebral hemispheres. 8. Bilateral carotid artery atheromatous calcifications. Critical Value/emergent results were called by telephone at the time of interpretation on 04/23/2019 at 10:11 pm to Dr. Milton Ferguson , who verbally acknowledged these results. Electronically Signed   By: Claudie Revering M.D.   On: 04/23/2019 22:16   Ct Cervical Spine Wo Contrast  Result Date: 04/23/2019 CLINICAL DATA:  Right facial swelling laceration following a fall. Sharp right neck pain and stiffness. EXAM: CT HEAD WITHOUT CONTRAST CT MAXILLOFACIAL WITHOUT CONTRAST CT CERVICAL SPINE WITHOUT CONTRAST TECHNIQUE: Multidetector CT imaging of the head, cervical spine, and maxillofacial structures were performed using the standard protocol without intravenous contrast. Multiplanar CT image reconstructions of the cervical spine and maxillofacial structures were also generated. COMPARISON:  Neck CT dated 07/30/2016 FINDINGS: CT HEAD FINDINGS Brain: Mildly enlarged ventricles and cortical sulci. Mild patchy white matter low density in both cerebral hemispheres. Small right lateral subdural hematoma measuring 4 mm in maximum thickness. 4 mm of midline shift to the left. No mass lesion or CT evidence of acute infarction. Vascular:  No hyperdense vessel or unexpected calcification. Skull: Normal. Negative for fracture or focal lesion. Other: None. CT MAXILLOFACIAL FINDINGS Osseous: Mildly comminuted right lateral orbital wall fracture with minimal medial displacement and lateral angulation of the anterior fragment. No other fractures are seen. Orbits: Normal appearing intraorbital contents. Sinuses: Moderate right maxillary sinus mucosal thickening and surgical absence of portions of the medial walls of both maxillary sinuses. Soft tissues: Right periorbital hematoma. CT CERVICAL SPINE FINDINGS Alignment: Normal. Skull base and vertebrae: No acute fracture. No  primary bone lesion or focal pathologic process. Soft tissues and spinal canal: No prevertebral fluid or swelling. No visible canal hematoma. Disc levels:  Multilevel degenerative changes. Upper chest: Minimal biapical pleural and parenchymal scarring. Other: Bilateral carotid artery calcifications. IMPRESSION: 1. 4 mm right lateral subdural hematoma and 4 mm of midline shift to the left. 2. Mildly comminuted and minimally displaced right lateral orbital wall fracture. 3. Right periorbital hematoma. 4. No skull fracture. 5. No cervical spine fracture or subluxation. 6. Multilevel cervical spine degenerative changes. 7. Mild diffuse cerebral and cerebellar atrophy and mild chronic small vessel white matter ischemic changes in both cerebral hemispheres. 8. Bilateral carotid artery atheromatous calcifications. Critical Value/emergent results were called by telephone at the time of interpretation on 04/23/2019 at 10:11 pm to Dr. Milton Ferguson , who verbally acknowledged these results. Electronically Signed   By: Claudie Revering M.D.   On: 04/23/2019 22:16   Ct Hip Right Wo Contrast  Result Date: 04/23/2019 CLINICAL DATA:  Pain EXAM: CT OF THE RIGHT HIP WITHOUT CONTRAST TECHNIQUE: Multidetector CT imaging of the right hip was performed according to the standard protocol. Multiplanar CT image reconstructions were also generated. COMPARISON:  None. FINDINGS: Bones/Joint/Cartilage There is a comminuted acute fracture involving the right acetabulum, primarily involving the anterior column. There is a nondisplaced fracture involving the right inferior pubic ramus. A fracture plane extends through the right superior pubic ramus. There is no dislocation. There is no evidence for displaced fracture involving the proximal right femur Ligaments Suboptimally assessed by CT. Muscles and Tendons No acute abnormality detected. Soft tissues There is a moderate size right-sided pelvic hematoma. Detection of active extravasation is not  possible on this noncontrast examination. IMPRESSION: 1. Acute comminuted fracture of the right acetabulum as detailed above. Nondisplaced fracture of the inferior pubic ramus on the right. 2. Moderate size right-sided pelvic hematoma. Electronically Signed   By: Constance Holster M.D.   On: 04/23/2019 22:09   Ct Maxillofacial Wo Contrast  Result Date: 04/23/2019 CLINICAL DATA:  Right facial swelling laceration following a fall. Sharp right neck pain and stiffness. EXAM: CT HEAD WITHOUT CONTRAST CT MAXILLOFACIAL WITHOUT CONTRAST CT CERVICAL SPINE WITHOUT CONTRAST TECHNIQUE: Multidetector CT imaging of the head, cervical spine, and maxillofacial structures were performed using the standard protocol without intravenous contrast. Multiplanar CT image reconstructions of the cervical spine and maxillofacial structures were also generated. COMPARISON:  Neck CT dated 07/30/2016 FINDINGS: CT HEAD FINDINGS Brain: Mildly enlarged ventricles and cortical sulci. Mild patchy white matter low density in both cerebral hemispheres. Small right lateral subdural hematoma measuring 4 mm in maximum thickness. 4 mm of midline shift to the left. No mass lesion or CT evidence of acute infarction. Vascular: No hyperdense vessel or unexpected calcification. Skull: Normal. Negative for fracture or focal lesion. Other: None. CT MAXILLOFACIAL FINDINGS Osseous: Mildly comminuted right lateral orbital wall fracture with minimal medial displacement and lateral angulation of the anterior fragment. No other fractures are seen. Orbits: Normal appearing intraorbital  contents. Sinuses: Moderate right maxillary sinus mucosal thickening and surgical absence of portions of the medial walls of both maxillary sinuses. Soft tissues: Right periorbital hematoma. CT CERVICAL SPINE FINDINGS Alignment: Normal. Skull base and vertebrae: No acute fracture. No primary bone lesion or focal pathologic process. Soft tissues and spinal canal: No prevertebral fluid  or swelling. No visible canal hematoma. Disc levels:  Multilevel degenerative changes. Upper chest: Minimal biapical pleural and parenchymal scarring. Other: Bilateral carotid artery calcifications. IMPRESSION: 1. 4 mm right lateral subdural hematoma and 4 mm of midline shift to the left. 2. Mildly comminuted and minimally displaced right lateral orbital wall fracture. 3. Right periorbital hematoma. 4. No skull fracture. 5. No cervical spine fracture or subluxation. 6. Multilevel cervical spine degenerative changes. 7. Mild diffuse cerebral and cerebellar atrophy and mild chronic small vessel white matter ischemic changes in both cerebral hemispheres. 8. Bilateral carotid artery atheromatous calcifications. Critical Value/emergent results were called by telephone at the time of interpretation on 04/23/2019 at 10:11 pm to Dr. Milton Ferguson , who verbally acknowledged these results. Electronically Signed   By: Claudie Revering M.D.   On: 04/23/2019 22:16     Assessment/Plan: Very pleasant 85 year old presented to [facility] ED last night after sustaining a fall at home. On Coumadin for a. Fib. She was reversed at [facility] per MD. CT head shows a small right sided SDH measuring 54mm in size with a very tiny amount of midline shift. I do no think she needs any surgical intervention at this time. Would recommend repeat head CT tomorrow morning.    Ocie Cornfield Ambrea Hegler 04/24/2019 2:08 AM

## 2019-04-24 NOTE — Progress Notes (Signed)
Patient ID: [name], female   DOB: [birth year], 85 y.o.   MRN: [id number] Follow up - Trauma and Critical Care  Patient Details:    [name] is an 85 y.o. female.  Lines/tubes :   Microbiology/Sepsis markers: Results for orders placed or performed during the hospital encounter of 04/23/19  SARS Coronavirus 2 [facility] order, Performed in [facility] hospital lab) Nasopharyngeal Nasopharyngeal Swab     Status: None   Collection Time: 04/23/19 11:09 PM   Specimen: Nasopharyngeal Swab  Result Value Ref Range Status   SARS Coronavirus 2 NEGATIVE NEGATIVE Final    Comment: (NOTE) If result is NEGATIVE SARS-CoV-2 target nucleic acids are NOT DETECTED. The SARS-CoV-2 RNA is generally detectable in upper and lower  respiratory specimens during the acute phase of infection. The lowest  concentration of SARS-CoV-2 viral copies this assay can detect is 250  copies / mL. A negative result does not preclude SARS-CoV-2 infection  and should not be used as the sole basis for treatment or other  patient management decisions.  A negative result may occur with  improper specimen collection / handling, submission of specimen other  than nasopharyngeal swab, presence of viral mutation(s) within the  areas targeted by this assay, and inadequate number of viral copies  (<250 copies / mL). A negative result must be combined with clinical  observations, patient history, and epidemiological information. If result is POSITIVE SARS-CoV-2 target nucleic acids are DETECTED. The SARS-CoV-2 RNA is generally detectable in upper and lower  respiratory specimens dur ing the acute phase of infection.  Positive  results are indicative of active infection with SARS-CoV-2.  Clinical  correlation with patient history and other diagnostic information is  necessary to determine patient infection status.  Positive results do  not rule out bacterial infection or co-infection with other viruses. If result is  PRESUMPTIVE POSTIVE SARS-CoV-2 nucleic acids MAY BE PRESENT.   A presumptive positive result was obtained on the submitted specimen  and confirmed on repeat testing.  While 2019 novel coronavirus  (SARS-CoV-2) nucleic acids may be present in the submitted sample  additional confirmatory testing may be necessary for epidemiological  and / or clinical management purposes  to differentiate between  SARS-CoV-2 and other Sarbecovirus currently known to infect humans.  If clinically indicated additional testing with an alternate test  methodology [phone]) is advised. The SARS-CoV-2 RNA is generally  detectable in upper and lower respiratory sp ecimens during the acute  phase of infection. The expected result is Negative. Fact Sheet for Patients:  StrictlyIdeas.no Fact Sheet for Healthcare Providers: BankingDealers.co.za This test is not yet approved or cleared by the [place] FDA and has been authorized for detection and/or diagnosis of SARS-CoV-2 by FDA under an Emergency Use Authorization (EUA).  This EUA will remain in effect (meaning this test can be used) for the duration of the COVID-19 declaration under Section 564(b)(1) of the Act, 21 U.S.C. section 360bbb-3(b)(1), unless the authorization is terminated or revoked sooner. Performed at [facility], [address], [place], [place] [address]   MRSA PCR Screening     Status: None   Collection Time: 04/24/19  2:51 AM   Specimen: Nasal Mucosa; Nasopharyngeal  Result Value Ref Range Status   MRSA by PCR NEGATIVE NEGATIVE Final    Comment:        The GeneXpert MRSA Assay (FDA approved for NASAL specimens only), is one component of a comprehensive MRSA colonization surveillance program. It is not intended to  diagnose MRSA infection nor to guide or monitor treatment for MRSA infections. Performed at [facility], [place] [address] [place], [place] [address]      Anti-infectives:  Anti-infectives (From admission, onward)   None      Best Practice/Protocols:  VTE Prophylaxis: Mechanical  Consults: Treatment Team:  Eustace Moore, MD Nicholes Stairs, MD Altamese Crowell, MD  Melida Quitter, MD  Events:  Chief Complaint/Subjective:    Overnight Issues:  Objective:  Vital signs for last 24 hours: Temp:  [97.5 F (36.4 C)-98.4 F (36.9 C)] 98.4 F (36.9 C) (08/16 0234) Pulse Rate:  [57-125] 117 (08/16 0500) Resp:  [17-22] 22 (08/16 0500) BP: (77-126)/(56-86) 124/84 (08/16 0500) SpO2:  [87 %-100 %] 95 % (08/16 0500) Weight:  [67.1 kg-69.6 kg] 69.6 kg (08/16 0234)  Hemodynamic parameters for last 24 hours:    Intake/Output from previous day: 08/15 0701 - 08/16 0700 In: 168 [IV Piggyback:168] Out: -   Intake/Output this shift: No intake/output data recorded.  Vent settings for last 24 hours:    Physical Exam:  Gen: Elderly, NAD HEENT: Right periorbital hematoma/ edema Resp: CTA B Cardiovascular: reg rate, irregular Abdomen: soft, NT Ext: Right hip tenderness, swelling Neuro: A&O x 4; no focal signs  Results for orders placed or performed during the hospital encounter of 04/23/19 (from the past 24 hour(s))  CBC with Differential/Platelet     Status: Abnormal   Collection Time: 04/23/19 10:16 PM  Result Value Ref Range   WBC 12.0 (H) 4.0 - 10.5 K/uL   RBC 4.48 3.87 - 5.11 MIL/uL   Hemoglobin 12.6 12.0 - 15.0 g/dL   HCT 40.1 36.0 - 46.0 %   MCV 89.5 80.0 - 100.0 fL   MCH 28.1 26.0 - 34.0 pg   MCHC 31.4 30.0 - 36.0 g/dL   RDW 14.3 11.5 - 15.5 %   Platelets 122 (L) 150 - 400 K/uL   nRBC 0.0 0.0 - 0.2 %   Neutrophils Relative % 85 %   Neutro Abs 10.3 (H) 1.7 - 7.7 K/uL   Lymphocytes Relative 7 %   Lymphs Abs 0.8 0.7 - 4.0 K/uL   Monocytes Relative 6 %   Monocytes Absolute 0.7 0.1 - 1.0 K/uL   Eosinophils Relative 1 %   Eosinophils Absolute 0.1 0.0 - 0.5 K/uL   Basophils Relative 0 %   Basophils  Absolute 0.0 0.0 - 0.1 K/uL   Immature Granulocytes 1 %   Abs Immature Granulocytes 0.07 0.00 - 0.07 K/uL  Comprehensive metabolic panel     Status: Abnormal   Collection Time: 04/23/19 10:16 PM  Result Value Ref Range   Sodium 139 135 - 145 mmol/L   Potassium 4.0 3.5 - 5.1 mmol/L   Chloride 107 98 - 111 mmol/L   CO2 25 22 - 32 mmol/L   Glucose, Bld 141 (H) 70 - 99 mg/dL   BUN 22 8 - 23 mg/dL   Creatinine, Ser 1.22 (H) 0.44 - 1.00 mg/dL   Calcium 8.8 (L) 8.9 - 10.3 mg/dL   Total Protein 6.7 6.5 - 8.1 g/dL   Albumin 3.4 (L) 3.5 - 5.0 g/dL   AST 32 15 - 41 U/L   ALT 23 0 - 44 U/L   Alkaline Phosphatase 58 38 - 126 U/L   Total Bilirubin 0.5 0.3 - 1.2 mg/dL   GFR calc non Af Amer 41 (L) >60 mL/min   GFR calc Af Amer 48 (L) >60 mL/min   Anion  gap 7 5 - 15  Protime-INR     Status: Abnormal   Collection Time: 04/23/19 10:16 PM  Result Value Ref Range   Prothrombin Time 27.9 (H) 11.4 - 15.2 seconds   INR 2.7 (H) 0.8 - 1.2  SARS Coronavirus 2 [facility] order, Performed in [place] hospital lab) Nasopharyngeal Nasopharyngeal Swab     Status: None   Collection Time: 04/23/19 11:09 PM   Specimen: Nasopharyngeal Swab  Result Value Ref Range   SARS Coronavirus 2 NEGATIVE NEGATIVE  CBC     Status: Abnormal   Collection Time: 04/24/19  1:54 AM  Result Value Ref Range   WBC 12.4 (H) 4.0 - 10.5 K/uL   RBC 4.26 3.87 - 5.11 MIL/uL   Hemoglobin 12.3 12.0 - 15.0 g/dL   HCT 38.6 36.0 - 46.0 %   MCV 90.6 80.0 - 100.0 fL   MCH 28.9 26.0 - 34.0 pg   MCHC 31.9 30.0 - 36.0 g/dL   RDW 14.3 11.5 - 15.5 %   Platelets 125 (L) 150 - 400 K/uL   nRBC 0.0 0.0 - 0.2 %  Protime-INR     Status: Abnormal   Collection Time: 04/24/19  1:54 AM  Result Value Ref Range   Prothrombin Time 15.7 (H) 11.4 - 15.2 seconds   INR 1.3 (H) 0.8 - 1.2  APTT     Status: None   Collection Time: 04/24/19  1:54 AM  Result Value Ref Range   aPTT 31 24 - 36 seconds  Comprehensive metabolic panel     Status: Abnormal    Collection Time: 04/24/19  1:54 AM  Result Value Ref Range   Sodium 135 135 - 145 mmol/L   Potassium 4.3 3.5 - 5.1 mmol/L   Chloride 104 98 - 111 mmol/L   CO2 21 (L) 22 - 32 mmol/L   Glucose, Bld 177 (H) 70 - 99 mg/dL   BUN 19 8 - 23 mg/dL   Creatinine, Ser 1.15 (H) 0.44 - 1.00 mg/dL   Calcium 8.5 (L) 8.9 - 10.3 mg/dL   Total Protein 6.3 (L) 6.5 - 8.1 g/dL   Albumin 3.1 (L) 3.5 - 5.0 g/dL   AST 26 15 - 41 U/L   ALT 20 0 - 44 U/L   Alkaline Phosphatase 51 38 - 126 U/L   Total Bilirubin 0.7 0.3 - 1.2 mg/dL   GFR calc non Af Amer 44 (L) >60 mL/min   GFR calc Af Amer 51 (L) >60 mL/min   Anion gap 10 5 - 15  MRSA PCR Screening     Status: None   Collection Time: 04/24/19  2:51 AM   Specimen: Nasal Mucosa; Nasopharyngeal  Result Value Ref Range   MRSA by PCR NEGATIVE NEGATIVE  Protime-INR     Status: None   Collection Time: 04/24/19  7:07 AM  Result Value Ref Range   Prothrombin Time 15.2 11.4 - 15.2 seconds   INR 1.2 0.8 - 1.2     Assessment/Plan:  Fall Chronic anticoagulation Right acetabular fracture - per Dr. Nelida Meuse - awaiting further recommendations Subdural hematoma - per Dr. Ronnald Ramp; no significant change on today's follow-up CT scan Right orbital wall fracture - per Dr. Redmond Baseman Hgb stable INR within normal range  Advance diet Transfer to Progressive Care    LOS: 0 days   Additional comments:I reviewed the patient's new clinical lab test results. INR/CBC and I reviewed the patients new imaging test results. CT head  Critical Care Total Time*: 15  Minutes  Maia Petties 04/24/2019  *Care during the described time interval was provided by me and/or other providers on the critical care team.  I have reviewed this patient's available data, including medical history, events of note, physical examination and test results as part of my evaluation.

## 2019-04-24 NOTE — Progress Notes (Signed)
Patient ID: [name], female   DOB: 24-Jan-1934, 85 y.o.   MRN: [id number]  CT scan reviewed.  Redistribution of blood products from the subdural space over the convexity to the tentorium.  No mass-effect or shift.  No new recommendations.

## 2019-04-24 NOTE — Progress Notes (Signed)
Pt transferred to 6N18. Pts niece notified of new room.
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
## 2019-04-24 NOTE — Consult Note (Signed)
ORTHOPAEDIC CONSULTATION  REQUESTING PHYSICIAN: Md, Trauma, MD  PCP:  Sharilyn Sites, MD  Chief Complaint: Fall down some stairs.  HPI: [name] is a 85 y.o. female who complains of right hip pain.  She states that she tripped over her 2 dogs this morning around 8 AM.  She fell onto her right side.  She hit her head and her hip.  She was initially seen at [facility] where she was noted to have nondisplaced right orbital wall fracture, right acetabular fracture, and small subdural hematoma.  She is on Coumadin chronically for atrial fibrillation.  She received vitamin K prior to transfer to [facility] for admission to the trauma service.  She has been hemodynamically stable throughout the course of her evaluation.  She states that she is independent with ADLs.  She does not use assistive devices to walk.  She lives with her niece.  Currently she is mostly complaining of right-sided hip pain and posterior buttock pain.  Pain is worse with movement of the right leg.  She denies numbness or tingling.  She denies radiation of the pain.  Past Medical History:  Diagnosis Date   Asthma    Atrial fibrillation (HCC)    Chronic rhinitis    COPD (chronic obstructive pulmonary disease) (HCC)    GERD (gastroesophageal reflux disease)    Mitral regurgitation    Mitral valve prolapse    Nasal polyps    Nonischemic cardiomyopathy (HCC)    LVEF 25-30% improved to 45-50%   Obstructive sleep apnea    Transudative pleural effusion    Past Surgical History:  Procedure Laterality Date   APPENDECTOMY  2000   Bilateral cataract surgery     LAPAROSCOPIC NISSEN FUNDOPLICATION  4627   NOSE SURGERY     Social History   Socioeconomic History   Marital status: Single    Spouse name: Not on file   Number of children: Not on file   Years of education: Not on file   Highest education level: Not on file  Occupational History   Not on file  Social Needs   Financial  resource strain: Not on file   Food insecurity    Worry: Not on file    Inability: Not on file   Transportation needs    Medical: Not on file    Non-medical: Not on file  Tobacco Use   Smoking status: Former Smoker    Packs/day: 0.50    Years: 20.00    Pack years: 10.00    Types: Cigarettes    Quit date: 10/08/1980    Years since quitting: 38.5   Smokeless tobacco: Never Used  Substance and Sexual Activity   Alcohol use: No    Alcohol/week: 0.0 standard drinks   Drug use: No   Sexual activity: Not on file  Lifestyle   Physical activity    Days per week: Not on file    Minutes per session: Not on file   Stress: Not on file  Relationships   Social connections    Talks on phone: Not on file    Gets together: Not on file    Attends religious service: Not on file    Active member of club or organization: Not on file    Attends meetings of clubs or organizations: Not on file    Relationship status: Not on file  Other Topics Concern   Not on file  Social History Narrative   Not on file  Family History  Problem Relation Age of Onset   Cancer Mother        Colon   Cancer Father        Colon   Atrial fibrillation Sister    Cancer Brother        Lung   Cancer Brother        Lung   Allergies  Allergen Reactions   Amoxicillin-Pot Clavulanate Itching and Swelling   Clarithromycin Itching and Swelling   Clindamycin Itching and Swelling   Doxycycline Itching and Swelling   Penicillins Itching and Swelling    Did it involve swelling of the face/tongue/throat, SOB, or low BP? Yes Did it involve sudden or severe rash/hives, skin peeling, or any reaction on the inside of your mouth or nose? No Did you need to seek medical attention at a hospital or doctor's office? Yes When did it last happen?Over 10 years If all above answers are NO, may proceed with cephalosporin use.    Prior to Admission medications   Medication Sig Start Date End Date  Taking? Authorizing Provider  albuterol (PROVENTIL HFA;VENTOLIN HFA) 108 (90 BASE) MCG/ACT inhaler Inhale 2 puffs into the lungs every 6 (six) hours as needed for wheezing or shortness of breath. 11/13/14  Yes Young, Clinton D, MD  albuterol (PROVENTIL) (2.5 MG/3ML) 0.083% nebulizer solution INHALE THE CONTENTS OF 1 VIAL USING NEBULIZER EVERY 4 HOURS AS NEEDED FOR WHEEZING OR SHORTNESS OF BREATH 08/05/16  Yes Young, Clinton D, MD  atorvastatin (LIPITOR) 10 MG tablet Take 10 mg by mouth daily.     Yes [provider]  digoxin (LANOXIN) 0.125 MG tablet TAKE 1 TABLET EVERY DAY Patient taking differently: Take 0.125 mg by mouth daily.  02/24/19  Yes Satira Sark, MD  diltiazem (CARDIZEM CD) 240 MG 24 hr capsule TAKE 1 CAPSULE BY MOUTH EVERY DAY Patient taking differently: Take 240 mg by mouth daily.  10/14/18  Yes Satira Sark, MD  docusate sodium (COLACE) 100 MG capsule Take 100 mg by mouth 2 (two) times daily.   Yes [provider]  escitalopram (LEXAPRO) 10 MG tablet Take 10 mg by mouth every morning. 01/09/19  Yes [provider]  esomeprazole (NEXIUM) 40 MG capsule Take 40 mg by mouth 2 (two) times daily.     Yes [provider]  furosemide (LASIX) 40 MG tablet TAKE 1 TABLET EVERY DAY Patient taking differently: Take 40 mg by mouth daily as needed for fluid.  12/20/18  Yes Satira Sark, MD  phenylephrine-shark liver oil-mineral oil-petrolatum (PREPARATION H) 0.25-3-14-71.9 % rectal ointment Place 1 application rectally 2 (two) times daily as needed for hemorrhoids.   Yes [provider]  potassium chloride SA (KLOR-CON M20) 20 MEQ tablet Take 1 tablet (20 mEq total) by mouth daily. Patient taking differently: Take 20 mEq by mouth daily as needed (when taking Lasix).  10/09/11  Yes Satira Sark, MD  traMADol (ULTRAM) 50 MG tablet TAKE 1 TABLET BY MOUTH EVERY 4 HOURS AS NEEDED Patient taking differently: Take 50 mg by mouth every 4 (four)  hours as needed for moderate pain or severe pain.  04/18/19  Yes [name] D, MD  warfarin (COUMADIN) 5 MG tablet Take 1 1/2 tablets daily except 1 tablet on Sundays, Tuesdays and Thursdays or as directed Patient taking differently: Take 5-7.5 mg by mouth See admin instructions. Take 7.5mg  (1.5  Tablets) daily except 5mg  (1 tablet) on Sundays, Tuesdays and Thursdays 03/24/19  Yes Satira Sark,  MD  diazepam (VALIUM) 5 MG tablet TAKE 1 TABLET BY MOUTH EVERY 12 HOURS AS NEEDED Patient not taking: Reported on 04/23/2019 09/27/18   Deneise Lever, MD   Ct Abdomen Pelvis Wo Contrast  Result Date: 04/23/2019 CLINICAL DATA:  Abdominal pain.  Trauma. EXAM: CT ABDOMEN AND PELVIS WITHOUT CONTRAST TECHNIQUE: Multidetector CT imaging of the abdomen and pelvis was performed following the standard protocol without IV contrast. COMPARISON:  CT chest dated July 14, 2013 FINDINGS: Lower chest: There are multiple scattered pulmonary nodules at the lung bases, measuring up to approximately 8 mm in the right lower lobe (axial series 4, image 3). These appear grossly similar in size and distribution when compared to CT from 2014. There is some bronchial wall thickening and mucus plugging at the lung bases bilaterally. There is significant cardiomegaly with biatrial enlargement. Hepatobiliary: The liver is normal. Normal gallbladder.The common bile duct is mildly dilated. Pancreas: The pancreatic duct is dilated without evidence for a discrete pancreatic mass. Spleen: No splenic laceration or hematoma. Adrenals/Urinary Tract: --Adrenal glands: No adrenal hemorrhage. --Right kidney/ureter: No hydronephrosis or perinephric hematoma. --Left kidney/ureter: No hydronephrosis or perinephric hematoma. --Urinary bladder: Unremarkable. Stomach/Bowel: --Stomach/Duodenum: There is a moderate to large hiatal hernia. There are surgical clips near the GE junction. The stomach is moderately distended. --Small bowel: There are dilated  small bowel loops scattered throughout the abdomen without evidence for a distinct transition point. --Colon: There is a large amount of stool throughout the colon. --Appendix: Normal. Vascular/Lymphatic: Atherosclerotic calcification is present within the non-aneurysmal abdominal aorta, without hemodynamically significant stenosis. --No retroperitoneal lymphadenopathy. --No mesenteric lymphadenopathy. --No pelvic or inguinal lymphadenopathy. Reproductive: Unremarkable Other: No ascites or free air. The abdominal wall is normal. Musculoskeletal. There is a comminuted fracture of the right acetabulum with a moderate size surrounding pelvic hematoma. One of the fracture planes extends into the superior pubic ramus on the right. There is a nondisplaced fracture involving the right inferior pubic ramus. IMPRESSION: 1. Comminuted fracture of the right acetabulum with a moderate sized surrounding pelvic hematoma. There is a nondisplaced fracture of the right inferior pubic ramus. 2. Dilated loops of small bowel without evidence for a distinct transition point. Findings may represent ileus or partial small bowel obstruction. 3. Significant cardiomegaly. 4. Large amount of stool throughout the colon. 5. Mildly dilated common bile duct and pancreatic duct without evidence for discrete pancreatic mass. Follow-up with a nonemergent outpatient MRCP is recommended for further evaluation of this finding. 6. Additional chronic findings as above. Electronically Signed   By: Constance Holster M.D.   On: 04/23/2019 22:06   Ct Head Wo Contrast  Result Date: 04/23/2019 CLINICAL DATA:  Right facial swelling laceration following a fall. Sharp right neck pain and stiffness. EXAM: CT HEAD WITHOUT CONTRAST CT MAXILLOFACIAL WITHOUT CONTRAST CT CERVICAL SPINE WITHOUT CONTRAST TECHNIQUE: Multidetector CT imaging of the head, cervical spine, and maxillofacial structures were performed using the standard protocol without intravenous  contrast. Multiplanar CT image reconstructions of the cervical spine and maxillofacial structures were also generated. COMPARISON:  Neck CT dated 07/30/2016 FINDINGS: CT HEAD FINDINGS Brain: Mildly enlarged ventricles and cortical sulci. Mild patchy white matter low density in both cerebral hemispheres. Small right lateral subdural hematoma measuring 4 mm in maximum thickness. 4 mm of midline shift to the left. No mass lesion or CT evidence of acute infarction. Vascular: No hyperdense vessel or unexpected calcification. Skull: Normal. Negative for fracture or focal lesion. Other: None. CT MAXILLOFACIAL FINDINGS Osseous: Mildly comminuted right  lateral orbital wall fracture with minimal medial displacement and lateral angulation of the anterior fragment. No other fractures are seen. Orbits: Normal appearing intraorbital contents. Sinuses: Moderate right maxillary sinus mucosal thickening and surgical absence of portions of the medial walls of both maxillary sinuses. Soft tissues: Right periorbital hematoma. CT CERVICAL SPINE FINDINGS Alignment: Normal. Skull base and vertebrae: No acute fracture. No primary bone lesion or focal pathologic process. Soft tissues and spinal canal: No prevertebral fluid or swelling. No visible canal hematoma. Disc levels:  Multilevel degenerative changes. Upper chest: Minimal biapical pleural and parenchymal scarring. Other: Bilateral carotid artery calcifications. IMPRESSION: 1. 4 mm right lateral subdural hematoma and 4 mm of midline shift to the left. 2. Mildly comminuted and minimally displaced right lateral orbital wall fracture. 3. Right periorbital hematoma. 4. No skull fracture. 5. No cervical spine fracture or subluxation. 6. Multilevel cervical spine degenerative changes. 7. Mild diffuse cerebral and cerebellar atrophy and mild chronic small vessel white matter ischemic changes in both cerebral hemispheres. 8. Bilateral carotid artery atheromatous calcifications. Critical  Value/emergent results were called by telephone at the time of interpretation on 04/23/2019 at 10:11 pm to Dr. Milton Ferguson , who verbally acknowledged these results. Electronically Signed   By: Claudie Revering M.D.   On: 04/23/2019 22:16   Ct Cervical Spine Wo Contrast  Result Date: 04/23/2019 CLINICAL DATA:  Right facial swelling laceration following a fall. Sharp right neck pain and stiffness. EXAM: CT HEAD WITHOUT CONTRAST CT MAXILLOFACIAL WITHOUT CONTRAST CT CERVICAL SPINE WITHOUT CONTRAST TECHNIQUE: Multidetector CT imaging of the head, cervical spine, and maxillofacial structures were performed using the standard protocol without intravenous contrast. Multiplanar CT image reconstructions of the cervical spine and maxillofacial structures were also generated. COMPARISON:  Neck CT dated 07/30/2016 FINDINGS: CT HEAD FINDINGS Brain: Mildly enlarged ventricles and cortical sulci. Mild patchy white matter low density in both cerebral hemispheres. Small right lateral subdural hematoma measuring 4 mm in maximum thickness. 4 mm of midline shift to the left. No mass lesion or CT evidence of acute infarction. Vascular: No hyperdense vessel or unexpected calcification. Skull: Normal. Negative for fracture or focal lesion. Other: None. CT MAXILLOFACIAL FINDINGS Osseous: Mildly comminuted right lateral orbital wall fracture with minimal medial displacement and lateral angulation of the anterior fragment. No other fractures are seen. Orbits: Normal appearing intraorbital contents. Sinuses: Moderate right maxillary sinus mucosal thickening and surgical absence of portions of the medial walls of both maxillary sinuses. Soft tissues: Right periorbital hematoma. CT CERVICAL SPINE FINDINGS Alignment: Normal. Skull base and vertebrae: No acute fracture. No primary bone lesion or focal pathologic process. Soft tissues and spinal canal: No prevertebral fluid or swelling. No visible canal hematoma. Disc levels:  Multilevel  degenerative changes. Upper chest: Minimal biapical pleural and parenchymal scarring. Other: Bilateral carotid artery calcifications. IMPRESSION: 1. 4 mm right lateral subdural hematoma and 4 mm of midline shift to the left. 2. Mildly comminuted and minimally displaced right lateral orbital wall fracture. 3. Right periorbital hematoma. 4. No skull fracture. 5. No cervical spine fracture or subluxation. 6. Multilevel cervical spine degenerative changes. 7. Mild diffuse cerebral and cerebellar atrophy and mild chronic small vessel white matter ischemic changes in both cerebral hemispheres. 8. Bilateral carotid artery atheromatous calcifications. Critical Value/emergent results were called by telephone at the time of interpretation on 04/23/2019 at 10:11 pm to Dr. Milton Ferguson , who verbally acknowledged these results. Electronically Signed   By: Claudie Revering M.D.   On: 04/23/2019 22:16   Ct  Hip Right Wo Contrast  Result Date: 04/23/2019 CLINICAL DATA:  Pain EXAM: CT OF THE RIGHT HIP WITHOUT CONTRAST TECHNIQUE: Multidetector CT imaging of the right hip was performed according to the standard protocol. Multiplanar CT image reconstructions were also generated. COMPARISON:  None. FINDINGS: Bones/Joint/Cartilage There is a comminuted acute fracture involving the right acetabulum, primarily involving the anterior column. There is a nondisplaced fracture involving the right inferior pubic ramus. A fracture plane extends through the right superior pubic ramus. There is no dislocation. There is no evidence for displaced fracture involving the proximal right femur Ligaments Suboptimally assessed by CT. Muscles and Tendons No acute abnormality detected. Soft tissues There is a moderate size right-sided pelvic hematoma. Detection of active extravasation is not possible on this noncontrast examination. IMPRESSION: 1. Acute comminuted fracture of the right acetabulum as detailed above. Nondisplaced fracture of the inferior  pubic ramus on the right. 2. Moderate size right-sided pelvic hematoma. Electronically Signed   By: Constance Holster M.D.   On: 04/23/2019 22:09   Ct Maxillofacial Wo Contrast  Result Date: 04/23/2019 CLINICAL DATA:  Right facial swelling laceration following a fall. Sharp right neck pain and stiffness. EXAM: CT HEAD WITHOUT CONTRAST CT MAXILLOFACIAL WITHOUT CONTRAST CT CERVICAL SPINE WITHOUT CONTRAST TECHNIQUE: Multidetector CT imaging of the head, cervical spine, and maxillofacial structures were performed using the standard protocol without intravenous contrast. Multiplanar CT image reconstructions of the cervical spine and maxillofacial structures were also generated. COMPARISON:  Neck CT dated 07/30/2016 FINDINGS: CT HEAD FINDINGS Brain: Mildly enlarged ventricles and cortical sulci. Mild patchy white matter low density in both cerebral hemispheres. Small right lateral subdural hematoma measuring 4 mm in maximum thickness. 4 mm of midline shift to the left. No mass lesion or CT evidence of acute infarction. Vascular: No hyperdense vessel or unexpected calcification. Skull: Normal. Negative for fracture or focal lesion. Other: None. CT MAXILLOFACIAL FINDINGS Osseous: Mildly comminuted right lateral orbital wall fracture with minimal medial displacement and lateral angulation of the anterior fragment. No other fractures are seen. Orbits: Normal appearing intraorbital contents. Sinuses: Moderate right maxillary sinus mucosal thickening and surgical absence of portions of the medial walls of both maxillary sinuses. Soft tissues: Right periorbital hematoma. CT CERVICAL SPINE FINDINGS Alignment: Normal. Skull base and vertebrae: No acute fracture. No primary bone lesion or focal pathologic process. Soft tissues and spinal canal: No prevertebral fluid or swelling. No visible canal hematoma. Disc levels:  Multilevel degenerative changes. Upper chest: Minimal biapical pleural and parenchymal scarring. Other:  Bilateral carotid artery calcifications. IMPRESSION: 1. 4 mm right lateral subdural hematoma and 4 mm of midline shift to the left. 2. Mildly comminuted and minimally displaced right lateral orbital wall fracture. 3. Right periorbital hematoma. 4. No skull fracture. 5. No cervical spine fracture or subluxation. 6. Multilevel cervical spine degenerative changes. 7. Mild diffuse cerebral and cerebellar atrophy and mild chronic small vessel white matter ischemic changes in both cerebral hemispheres. 8. Bilateral carotid artery atheromatous calcifications. Critical Value/emergent results were called by telephone at the time of interpretation on 04/23/2019 at 10:11 pm to Dr. Milton Ferguson , who verbally acknowledged these results. Electronically Signed   By: Claudie Revering M.D.   On: 04/23/2019 22:16    Positive ROS: All other systems have been reviewed and were otherwise negative with the exception of those mentioned in the HPI and as above.  Physical Exam: General: Alert, no acute distress, impressive periorbital hematoma of the right eye Cardiovascular: No pedal edema Respiratory: No cyanosis, no  use of accessory musculature GI: No organomegaly, abdomen is soft and non-tender Skin: No lesions in the area of chief complaint Neurologic: Sensation intact distally Psychiatric: Patient is competent for consent with normal mood and affect Lymphatic: No axillary or cervical lymphadenopathy  MUSCULOSKELETAL:  Bilateral upper extremity exam is benign.  She is neurovascular intact there.  Bilateral lower extremities are benign with no open wounds or obvious deformities.  Neurovascular intact.  Right hip demonstrates tenderness to palpation along the trochanter as well as with logroll pain.  Assessment: 1.  Right acetabular fracture, relatively nondisplaced T-shaped  Plan: -At this time would recommend nonweightbearing to the right lower extremity.  This is a relatively nondisplaced acetabular fracture,  but does involve a portion of the dome.  I will defer to the trauma specialist for the decision for surgery versus nonoperative management.  I will have Dr. Marcelino Scot review these images tomorrow.  We will update the primary team when able.  I have ordered plain radiographs for further assessment of the acetabulum fracture.    Nicholes Stairs, MD Cell [phone]    04/24/2019 2:23 AM

## 2019-04-25 ENCOUNTER — Inpatient Hospital Stay (HOSPITAL_COMMUNITY): Payer: Medicare PPO

## 2019-04-25 LAB — PROTIME-INR
INR: 1.1 (ref 0.8–1.2)
Prothrombin Time: 14 seconds (ref 11.4–15.2)

## 2019-04-25 MED ORDER — OXYCODONE HCL 5 MG PO TABS
5.0000 mg | ORAL_TABLET | ORAL | Status: DC | PRN
  Administered 2019-04-27 – 2019-05-01 (×4): 10 mg via ORAL
  Filled 2019-04-25 (×4): qty 2

## 2019-04-25 MED FILL — Fentanyl Citrate Preservative Free (PF) Inj 100 MCG/2ML: INTRAMUSCULAR | Qty: 2 | Status: AC

## 2019-04-25 NOTE — Progress Notes (Signed)
Subjective: Patient does not report any headaches. Resting comfortably in bed  Objective: Vital signs in last 24 hours: Temp:  [98 F (36.7 C)-99.1 F (37.3 C)] 98.3 F (36.8 C) (08/17 1222) Pulse Rate:  [53-98] 69 (08/17 1222) Resp:  [14-18] 14 (08/17 1222) BP: (80-132)/(58-83) 132/68 (08/17 1222) SpO2:  [95 %-99 %] 99 % (08/17 1222)  Intake/Output from previous day: 08/16 0701 - 08/17 0700 In: 837.7 [I.V.:837.7] Out: -  Intake/Output this shift: No intake/output data recorded.  Neurologic: Grossly normal  Lab Results: Lab Results  Component Value Date   WBC 12.4 (H) 04/24/2019   HGB 12.3 04/24/2019   HCT 38.6 04/24/2019   MCV 90.6 04/24/2019   PLT 125 (L) 04/24/2019   Lab Results  Component Value Date   INR 1.1 04/25/2019   BMET Lab Results  Component Value Date   NA 135 04/24/2019   K 4.3 04/24/2019   CL 104 04/24/2019   CO2 21 (L) 04/24/2019   GLUCOSE 177 (H) 04/24/2019   BUN 19 04/24/2019   CREATININE 1.15 (H) 04/24/2019   CALCIUM 8.5 (L) 04/24/2019    Studies/Results: Ct Abdomen Pelvis Wo Contrast  Result Date: 04/23/2019 CLINICAL DATA:  Abdominal pain.  Trauma. EXAM: CT ABDOMEN AND PELVIS WITHOUT CONTRAST TECHNIQUE: Multidetector CT imaging of the abdomen and pelvis was performed following the standard protocol without IV contrast. COMPARISON:  CT chest dated July 14, 2013 FINDINGS: Lower chest: There are multiple scattered pulmonary nodules at the lung bases, measuring up to approximately 8 mm in the right lower lobe (axial series 4, image 3). These appear grossly similar in size and distribution when compared to CT from 2014. There is some bronchial wall thickening and mucus plugging at the lung bases bilaterally. There is significant cardiomegaly with biatrial enlargement. Hepatobiliary: The liver is normal. Normal gallbladder.The common bile duct is mildly dilated. Pancreas: The pancreatic duct is dilated without evidence for a discrete pancreatic  mass. Spleen: No splenic laceration or hematoma. Adrenals/Urinary Tract: --Adrenal glands: No adrenal hemorrhage. --Right kidney/ureter: No hydronephrosis or perinephric hematoma. --Left kidney/ureter: No hydronephrosis or perinephric hematoma. --Urinary bladder: Unremarkable. Stomach/Bowel: --Stomach/Duodenum: There is a moderate to large hiatal hernia. There are surgical clips near the GE junction. The stomach is moderately distended. --Small bowel: There are dilated small bowel loops scattered throughout the abdomen without evidence for a distinct transition point. --Colon: There is a large amount of stool throughout the colon. --Appendix: Normal. Vascular/Lymphatic: Atherosclerotic calcification is present within the non-aneurysmal abdominal aorta, without hemodynamically significant stenosis. --No retroperitoneal lymphadenopathy. --No mesenteric lymphadenopathy. --No pelvic or inguinal lymphadenopathy. Reproductive: Unremarkable Other: No ascites or free air. The abdominal wall is normal. Musculoskeletal. There is a comminuted fracture of the right acetabulum with a moderate size surrounding pelvic hematoma. One of the fracture planes extends into the superior pubic ramus on the right. There is a nondisplaced fracture involving the right inferior pubic ramus. IMPRESSION: 1. Comminuted fracture of the right acetabulum with a moderate sized surrounding pelvic hematoma. There is a nondisplaced fracture of the right inferior pubic ramus. 2. Dilated loops of small bowel without evidence for a distinct transition point. Findings may represent ileus or partial small bowel obstruction. 3. Significant cardiomegaly. 4. Large amount of stool throughout the colon. 5. Mildly dilated common bile duct and pancreatic duct without evidence for discrete pancreatic mass. Follow-up with a nonemergent outpatient MRCP is recommended for further evaluation of this finding. 6. Additional chronic findings as above. Electronically  Signed   By: Harrell Gave  Green M.D.   On: 04/23/2019 22:06   Ct Head Wo Contrast  Result Date: 04/24/2019 CLINICAL DATA:  Fall EXAM: CT HEAD WITHOUT CONTRAST TECHNIQUE: Contiguous axial images were obtained from the base of the skull through the vertex without intravenous contrast. COMPARISON:  04/23/2019 FINDINGS: Brain: Small subdural hematoma along the right leaflet of the tentorium cerebelli, redistributed from the more lateral location on the prior study. No new site of hemorrhage. No increased blood volume. No midline shift or other mass effect. There is periventricular hypoattenuation compatible with chronic microvascular disease. Vascular: No abnormal hyperdensity of the major intracranial arteries or dural venous sinuses. No intracranial atherosclerosis. Skull: Large right periorbital scalp hematoma. Redemonstration of lateral right orbital wall fracture. Sinuses/Orbits: Partial opacification of the right maxillary sinus. No mastoid or middle ear effusion. Bilateral lens replacements. IMPRESSION: Redistribution of small right-sided subdural hematoma, now layering along the right leaflet of the tentorium cerebelli. Electronically Signed   By: Ulyses Jarred M.D.   On: 04/24/2019 05:29   Ct Head Wo Contrast  Result Date: 04/23/2019 CLINICAL DATA:  Right facial swelling laceration following a fall. Sharp right neck pain and stiffness. EXAM: CT HEAD WITHOUT CONTRAST CT MAXILLOFACIAL WITHOUT CONTRAST CT CERVICAL SPINE WITHOUT CONTRAST TECHNIQUE: Multidetector CT imaging of the head, cervical spine, and maxillofacial structures were performed using the standard protocol without intravenous contrast. Multiplanar CT image reconstructions of the cervical spine and maxillofacial structures were also generated. COMPARISON:  Neck CT dated 07/30/2016 FINDINGS: CT HEAD FINDINGS Brain: Mildly enlarged ventricles and cortical sulci. Mild patchy white matter low density in both cerebral hemispheres. Small right  lateral subdural hematoma measuring 4 mm in maximum thickness. 4 mm of midline shift to the left. No mass lesion or CT evidence of acute infarction. Vascular: No hyperdense vessel or unexpected calcification. Skull: Normal. Negative for fracture or focal lesion. Other: None. CT MAXILLOFACIAL FINDINGS Osseous: Mildly comminuted right lateral orbital wall fracture with minimal medial displacement and lateral angulation of the anterior fragment. No other fractures are seen. Orbits: Normal appearing intraorbital contents. Sinuses: Moderate right maxillary sinus mucosal thickening and surgical absence of portions of the medial walls of both maxillary sinuses. Soft tissues: Right periorbital hematoma. CT CERVICAL SPINE FINDINGS Alignment: Normal. Skull base and vertebrae: No acute fracture. No primary bone lesion or focal pathologic process. Soft tissues and spinal canal: No prevertebral fluid or swelling. No visible canal hematoma. Disc levels:  Multilevel degenerative changes. Upper chest: Minimal biapical pleural and parenchymal scarring. Other: Bilateral carotid artery calcifications. IMPRESSION: 1. 4 mm right lateral subdural hematoma and 4 mm of midline shift to the left. 2. Mildly comminuted and minimally displaced right lateral orbital wall fracture. 3. Right periorbital hematoma. 4. No skull fracture. 5. No cervical spine fracture or subluxation. 6. Multilevel cervical spine degenerative changes. 7. Mild diffuse cerebral and cerebellar atrophy and mild chronic small vessel white matter ischemic changes in both cerebral hemispheres. 8. Bilateral carotid artery atheromatous calcifications. Critical Value/emergent results were called by telephone at the time of interpretation on 04/23/2019 at 10:11 pm to Dr. Milton Ferguson , who verbally acknowledged these results. Electronically Signed   By: Claudie Revering M.D.   On: 04/23/2019 22:16   Ct Cervical Spine Wo Contrast  Result Date: 04/23/2019 CLINICAL DATA:  Right  facial swelling laceration following a fall. Sharp right neck pain and stiffness. EXAM: CT HEAD WITHOUT CONTRAST CT MAXILLOFACIAL WITHOUT CONTRAST CT CERVICAL SPINE WITHOUT CONTRAST TECHNIQUE: Multidetector CT imaging of the head, cervical spine, and maxillofacial  structures were performed using the standard protocol without intravenous contrast. Multiplanar CT image reconstructions of the cervical spine and maxillofacial structures were also generated. COMPARISON:  Neck CT dated 07/30/2016 FINDINGS: CT HEAD FINDINGS Brain: Mildly enlarged ventricles and cortical sulci. Mild patchy white matter low density in both cerebral hemispheres. Small right lateral subdural hematoma measuring 4 mm in maximum thickness. 4 mm of midline shift to the left. No mass lesion or CT evidence of acute infarction. Vascular: No hyperdense vessel or unexpected calcification. Skull: Normal. Negative for fracture or focal lesion. Other: None. CT MAXILLOFACIAL FINDINGS Osseous: Mildly comminuted right lateral orbital wall fracture with minimal medial displacement and lateral angulation of the anterior fragment. No other fractures are seen. Orbits: Normal appearing intraorbital contents. Sinuses: Moderate right maxillary sinus mucosal thickening and surgical absence of portions of the medial walls of both maxillary sinuses. Soft tissues: Right periorbital hematoma. CT CERVICAL SPINE FINDINGS Alignment: Normal. Skull base and vertebrae: No acute fracture. No primary bone lesion or focal pathologic process. Soft tissues and spinal canal: No prevertebral fluid or swelling. No visible canal hematoma. Disc levels:  Multilevel degenerative changes. Upper chest: Minimal biapical pleural and parenchymal scarring. Other: Bilateral carotid artery calcifications. IMPRESSION: 1. 4 mm right lateral subdural hematoma and 4 mm of midline shift to the left. 2. Mildly comminuted and minimally displaced right lateral orbital wall fracture. 3. Right periorbital  hematoma. 4. No skull fracture. 5. No cervical spine fracture or subluxation. 6. Multilevel cervical spine degenerative changes. 7. Mild diffuse cerebral and cerebellar atrophy and mild chronic small vessel white matter ischemic changes in both cerebral hemispheres. 8. Bilateral carotid artery atheromatous calcifications. Critical Value/emergent results were called by telephone at the time of interpretation on 04/23/2019 at 10:11 pm to Dr. Milton Ferguson , who verbally acknowledged these results. Electronically Signed   By: Claudie Revering M.D.   On: 04/23/2019 22:16   Dg Pelvis Comp Min 3v  Result Date: 04/24/2019 CLINICAL DATA:  Right acetabular fractures. EXAM: [facility] PELVIS - 3+ VIEW COMPARISON:  Pelvis and hip radiograph earlier this day. Abdominal CT yesterday. FINDINGS: Comminuted right acetabular fracture which is minimally displaced superiorly and posteriorly. Fracture extension into the right pubic ramus was better appreciated on CT. Overall alignment is not significantly changed from prior exam. No new abnormalities. IMPRESSION: Unchanged alignment of comminuted right acetabular fracture. Electronically Signed   By: Keith Rake M.D.   On: 04/24/2019 21:00   Ct Hip Right Wo Contrast  Result Date: 04/23/2019 CLINICAL DATA:  Pain EXAM: CT OF THE RIGHT HIP WITHOUT CONTRAST TECHNIQUE: Multidetector CT imaging of the right hip was performed according to the standard protocol. Multiplanar CT image reconstructions were also generated. COMPARISON:  None. FINDINGS: Bones/Joint/Cartilage There is a comminuted acute fracture involving the right acetabulum, primarily involving the anterior column. There is a nondisplaced fracture involving the right inferior pubic ramus. A fracture plane extends through the right superior pubic ramus. There is no dislocation. There is no evidence for displaced fracture involving the proximal right femur Ligaments Suboptimally assessed by CT. Muscles and Tendons No acute  abnormality detected. Soft tissues There is a moderate size right-sided pelvic hematoma. Detection of active extravasation is not possible on this noncontrast examination. IMPRESSION: 1. Acute comminuted fracture of the right acetabulum as detailed above. Nondisplaced fracture of the inferior pubic ramus on the right. 2. Moderate size right-sided pelvic hematoma. Electronically Signed   By: Constance Holster M.D.   On: 04/23/2019 22:09   Dg Hand Complete  Right  Result Date: 04/25/2019 CLINICAL DATA:  Right middle finger pain after fall. EXAM: RIGHT HAND - COMPLETE 3+ VIEW COMPARISON:  None. FINDINGS: Small minimally displaced fracture is seen involving the proximal base of the third proximal phalanx. No other bony abnormality is noted. Joint spaces are otherwise unremarkable. IMPRESSION: Minimally displaced fracture involving proximal base of third proximal phalanx. Electronically Signed   By: Marijo Conception M.D.   On: 04/25/2019 10:40   Dg Hip Unilat With Pelvis 2-3 Views Right  Result Date: 04/24/2019 CLINICAL DATA:  Right acetabular fracture. EXAM: DG HIP (WITH OR WITHOUT PELVIS) 2-3V RIGHT COMPARISON:  None. FINDINGS: Minimally displaced fracture of the right acetabulum is seen. Fractures of the right superior and inferior pubic rami are also seen. No evidence of proximal femur fracture or hip dislocation. IMPRESSION: Right acetabular and pubic rami fractures. Electronically Signed   By: Marlaine Hind M.D.   On: 04/24/2019 06:55   Ct Maxillofacial Wo Contrast  Result Date: 04/23/2019 CLINICAL DATA:  Right facial swelling laceration following a fall. Sharp right neck pain and stiffness. EXAM: CT HEAD WITHOUT CONTRAST CT MAXILLOFACIAL WITHOUT CONTRAST CT CERVICAL SPINE WITHOUT CONTRAST TECHNIQUE: Multidetector CT imaging of the head, cervical spine, and maxillofacial structures were performed using the standard protocol without intravenous contrast. Multiplanar CT image reconstructions of the cervical  spine and maxillofacial structures were also generated. COMPARISON:  Neck CT dated 07/30/2016 FINDINGS: CT HEAD FINDINGS Brain: Mildly enlarged ventricles and cortical sulci. Mild patchy white matter low density in both cerebral hemispheres. Small right lateral subdural hematoma measuring 4 mm in maximum thickness. 4 mm of midline shift to the left. No mass lesion or CT evidence of acute infarction. Vascular: No hyperdense vessel or unexpected calcification. Skull: Normal. Negative for fracture or focal lesion. Other: None. CT MAXILLOFACIAL FINDINGS Osseous: Mildly comminuted right lateral orbital wall fracture with minimal medial displacement and lateral angulation of the anterior fragment. No other fractures are seen. Orbits: Normal appearing intraorbital contents. Sinuses: Moderate right maxillary sinus mucosal thickening and surgical absence of portions of the medial walls of both maxillary sinuses. Soft tissues: Right periorbital hematoma. CT CERVICAL SPINE FINDINGS Alignment: Normal. Skull base and vertebrae: No acute fracture. No primary bone lesion or focal pathologic process. Soft tissues and spinal canal: No prevertebral fluid or swelling. No visible canal hematoma. Disc levels:  Multilevel degenerative changes. Upper chest: Minimal biapical pleural and parenchymal scarring. Other: Bilateral carotid artery calcifications. IMPRESSION: 1. 4 mm right lateral subdural hematoma and 4 mm of midline shift to the left. 2. Mildly comminuted and minimally displaced right lateral orbital wall fracture. 3. Right periorbital hematoma. 4. No skull fracture. 5. No cervical spine fracture or subluxation. 6. Multilevel cervical spine degenerative changes. 7. Mild diffuse cerebral and cerebellar atrophy and mild chronic small vessel white matter ischemic changes in both cerebral hemispheres. 8. Bilateral carotid artery atheromatous calcifications. Critical Value/emergent results were called by telephone at the time of  interpretation on 04/23/2019 at 10:11 pm to Dr. Milton Ferguson , who verbally acknowledged these results. Electronically Signed   By: Claudie Revering M.D.   On: 04/23/2019 22:16    Assessment/Plan: Doing well, Will remain off of coumadin for atleast another week until we get another CT of her head.    LOS: 1 day    Ocie Cornfield Emma-Lee Oddo 04/25/2019, 3:17 PM

## 2019-04-25 NOTE — Progress Notes (Signed)
Patient ID: [name], female   DOB: [birth year], 85 y.o.   MRN: [id number]   LOS: 1 day   Subjective: Pt stable, pain controlled.   Objective: Vital signs in last 24 hours: Temp:  [98 F (36.7 C)-99.1 F (37.3 C)] 98.3 F (36.8 C) (08/17 1222) Pulse Rate:  [53-100] 69 (08/17 1222) Resp:  [13-23] 14 (08/17 1222) BP: (80-132)/(52-88) 132/68 (08/17 1222) SpO2:  [95 %-100 %] 99 % (08/17 1222) Last BM Date: 04/23/19   Laboratory  CBC Recent Labs    04/23/19 2216 04/24/19 0154  WBC 12.0* 12.4*  HGB 12.6 12.3  HCT 40.1 38.6  PLT 122* 125*   BMET Recent Labs    04/23/19 2216 04/24/19 0154  NA 139 135  K 4.0 4.3  CL 107 104  CO2 25 21*  GLUCOSE 141* 177*  BUN 22 19  CREATININE 1.22* 1.15*  CALCIUM 8.8* 8.5*     Physical Exam General appearance: alert and no distress  Right hand: Mildly swollen, ecchymotic, middle finger P1 mod TTP   Assessment/Plan: Fall  Right acet fx -- TDWB x 8 weeks. F/u with Dr. Stann Mainland in 2 weeks. Right middle finger prox phalanx fx -- Buddy tape to index finger, WBAT.  Other injuries including SDH and orbit fx Multiple medical problems including afib on coumadin, CKD, COPD, OSA, and MVP -- per trauma service     Lisette Abu, PA-C Orthopedic Surgery [phone] 04/25/2019

## 2019-04-25 NOTE — Progress Notes (Signed)
Subjective: CC: Right hand pain Patient complains of pain in her right hand along with swelling and bruising. She notes pain of her right hip/pelvis only when she moves. She wants to get out of bed. Tolerating diet but does not like the food. No abdominal pain, n/v. Passing flatus but no BM in the last 2 days. She feels like she is wheezing but denies any sob or chest pain. No changes in vision.   Objective: Vital signs in last 24 hours: Temp:  [98 F (36.7 C)-99.1 F (37.3 C)] 98.4 F (36.9 C) (08/17 0801) Pulse Rate:  [53-108] 80 (08/17 0801) Resp:  [12-23] 18 (08/17 0801) BP: (80-129)/(52-88) 114/77 (08/17 0801) SpO2:  [95 %-100 %] 95 % (08/17 0801) Last BM Date: 04/23/19  Intake/Output from previous day: 08/16 0701 - 08/17 0700 In: 837.7 [I.V.:837.7] Out: -  Intake/Output this shift: No intake/output data recorded.  PE: Gen:  Alert, NAD, pleasant HEENT: Right eye ecchymosis and swelling. PERRL. EOMI without entrapment.  Card:  Irr, Irr Pulm:  Expiratory wheezing, normal rate and effort. No rales or rhonchi. Pulling 750 on IS  Abd: Soft, NT, ND, +BS Ext:  Right hand with ecchymosis and edema. Tenderness of the 3rd MCP. No gross deformity. Able flexion/extension of all digits. Intact rom of wrist. No snuffbox ttp.  No LE edema. DP and radial pulses 2+ Psych: A&Ox3  Skin: no rashes noted, warm and dry  Lab Results:  Recent Labs    04/23/19 2216 04/24/19 0154  WBC 12.0* 12.4*  HGB 12.6 12.3  HCT 40.1 38.6  PLT 122* 125*   BMET Recent Labs    04/23/19 2216 04/24/19 0154  NA 139 135  K 4.0 4.3  CL 107 104  CO2 25 21*  GLUCOSE 141* 177*  BUN 22 19  CREATININE 1.22* 1.15*  CALCIUM 8.8* 8.5*   PT/INR Recent Labs    04/24/19 1856 04/25/19 0057  LABPROT 14.1 14.0  INR 1.1 1.1   CMP     Component Value Date/Time   NA 135 04/24/2019 0154   K 4.3 04/24/2019 0154   CL 104 04/24/2019 0154   CO2 21 (L) 04/24/2019 0154   GLUCOSE 177 (H) 04/24/2019  0154   BUN 19 04/24/2019 0154   CREATININE 1.15 (H) 04/24/2019 0154   CALCIUM 8.5 (L) 04/24/2019 0154   PROT 6.3 (L) 04/24/2019 0154   ALBUMIN 3.1 (L) 04/24/2019 0154   AST 26 04/24/2019 0154   ALT 20 04/24/2019 0154   ALKPHOS 51 04/24/2019 0154   BILITOT 0.7 04/24/2019 0154   GFRNONAA 44 (L) 04/24/2019 0154   GFRAA 51 (L) 04/24/2019 0154   Lipase  No results found for: LIPASE     Studies/Results: Ct Abdomen Pelvis Wo Contrast  Result Date: 04/23/2019 CLINICAL DATA:  Abdominal pain.  Trauma. EXAM: CT ABDOMEN AND PELVIS WITHOUT CONTRAST TECHNIQUE: Multidetector CT imaging of the abdomen and pelvis was performed following the standard protocol without IV contrast. COMPARISON:  CT chest dated July 14, 2013 FINDINGS: Lower chest: There are multiple scattered pulmonary nodules at the lung bases, measuring up to approximately 8 mm in the right lower lobe (axial series 4, image 3). These appear grossly similar in size and distribution when compared to CT from 2014. There is some bronchial wall thickening and mucus plugging at the lung bases bilaterally. There is significant cardiomegaly with biatrial enlargement. Hepatobiliary: The liver is normal. Normal gallbladder.The common bile duct is mildly dilated. Pancreas: The pancreatic  duct is dilated without evidence for a discrete pancreatic mass. Spleen: No splenic laceration or hematoma. Adrenals/Urinary Tract: --Adrenal glands: No adrenal hemorrhage. --Right kidney/ureter: No hydronephrosis or perinephric hematoma. --Left kidney/ureter: No hydronephrosis or perinephric hematoma. --Urinary bladder: Unremarkable. Stomach/Bowel: --Stomach/Duodenum: There is a moderate to large hiatal hernia. There are surgical clips near the GE junction. The stomach is moderately distended. --Small bowel: There are dilated small bowel loops scattered throughout the abdomen without evidence for a distinct transition point. --Colon: There is a large amount of stool  throughout the colon. --Appendix: Normal. Vascular/Lymphatic: Atherosclerotic calcification is present within the non-aneurysmal abdominal aorta, without hemodynamically significant stenosis. --No retroperitoneal lymphadenopathy. --No mesenteric lymphadenopathy. --No pelvic or inguinal lymphadenopathy. Reproductive: Unremarkable Other: No ascites or free air. The abdominal wall is normal. Musculoskeletal. There is a comminuted fracture of the right acetabulum with a moderate size surrounding pelvic hematoma. One of the fracture planes extends into the superior pubic ramus on the right. There is a nondisplaced fracture involving the right inferior pubic ramus. IMPRESSION: 1. Comminuted fracture of the right acetabulum with a moderate sized surrounding pelvic hematoma. There is a nondisplaced fracture of the right inferior pubic ramus. 2. Dilated loops of small bowel without evidence for a distinct transition point. Findings may represent ileus or partial small bowel obstruction. 3. Significant cardiomegaly. 4. Large amount of stool throughout the colon. 5. Mildly dilated common bile duct and pancreatic duct without evidence for discrete pancreatic mass. Follow-up with a nonemergent outpatient MRCP is recommended for further evaluation of this finding. 6. Additional chronic findings as above. Electronically Signed   By: Constance Holster M.D.   On: 04/23/2019 22:06   Ct Head Wo Contrast  Result Date: 04/24/2019 CLINICAL DATA:  Fall EXAM: CT HEAD WITHOUT CONTRAST TECHNIQUE: Contiguous axial images were obtained from the base of the skull through the vertex without intravenous contrast. COMPARISON:  04/23/2019 FINDINGS: Brain: Small subdural hematoma along the right leaflet of the tentorium cerebelli, redistributed from the more lateral location on the prior study. No new site of hemorrhage. No increased blood volume. No midline shift or other mass effect. There is periventricular hypoattenuation compatible with  chronic microvascular disease. Vascular: No abnormal hyperdensity of the major intracranial arteries or dural venous sinuses. No intracranial atherosclerosis. Skull: Large right periorbital scalp hematoma. Redemonstration of lateral right orbital wall fracture. Sinuses/Orbits: Partial opacification of the right maxillary sinus. No mastoid or middle ear effusion. Bilateral lens replacements. IMPRESSION: Redistribution of small right-sided subdural hematoma, now layering along the right leaflet of the tentorium cerebelli. Electronically Signed   By: Ulyses Jarred M.D.   On: 04/24/2019 05:29   Ct Head Wo Contrast  Result Date: 04/23/2019 CLINICAL DATA:  Right facial swelling laceration following a fall. Sharp right neck pain and stiffness. EXAM: CT HEAD WITHOUT CONTRAST CT MAXILLOFACIAL WITHOUT CONTRAST CT CERVICAL SPINE WITHOUT CONTRAST TECHNIQUE: Multidetector CT imaging of the head, cervical spine, and maxillofacial structures were performed using the standard protocol without intravenous contrast. Multiplanar CT image reconstructions of the cervical spine and maxillofacial structures were also generated. COMPARISON:  Neck CT dated 07/30/2016 FINDINGS: CT HEAD FINDINGS Brain: Mildly enlarged ventricles and cortical sulci. Mild patchy white matter low density in both cerebral hemispheres. Small right lateral subdural hematoma measuring 4 mm in maximum thickness. 4 mm of midline shift to the left. No mass lesion or CT evidence of acute infarction. Vascular: No hyperdense vessel or unexpected calcification. Skull: Normal. Negative for fracture or focal lesion. Other: None. CT MAXILLOFACIAL FINDINGS  Osseous: Mildly comminuted right lateral orbital wall fracture with minimal medial displacement and lateral angulation of the anterior fragment. No other fractures are seen. Orbits: Normal appearing intraorbital contents. Sinuses: Moderate right maxillary sinus mucosal thickening and surgical absence of portions of the  medial walls of both maxillary sinuses. Soft tissues: Right periorbital hematoma. CT CERVICAL SPINE FINDINGS Alignment: Normal. Skull base and vertebrae: No acute fracture. No primary bone lesion or focal pathologic process. Soft tissues and spinal canal: No prevertebral fluid or swelling. No visible canal hematoma. Disc levels:  Multilevel degenerative changes. Upper chest: Minimal biapical pleural and parenchymal scarring. Other: Bilateral carotid artery calcifications. IMPRESSION: 1. 4 mm right lateral subdural hematoma and 4 mm of midline shift to the left. 2. Mildly comminuted and minimally displaced right lateral orbital wall fracture. 3. Right periorbital hematoma. 4. No skull fracture. 5. No cervical spine fracture or subluxation. 6. Multilevel cervical spine degenerative changes. 7. Mild diffuse cerebral and cerebellar atrophy and mild chronic small vessel white matter ischemic changes in both cerebral hemispheres. 8. Bilateral carotid artery atheromatous calcifications. Critical Value/emergent results were called by telephone at the time of interpretation on 04/23/2019 at 10:11 pm to Dr. Milton Ferguson , who verbally acknowledged these results. Electronically Signed   By: Claudie Revering M.D.   On: 04/23/2019 22:16   Ct Cervical Spine Wo Contrast  Result Date: 04/23/2019 CLINICAL DATA:  Right facial swelling laceration following a fall. Sharp right neck pain and stiffness. EXAM: CT HEAD WITHOUT CONTRAST CT MAXILLOFACIAL WITHOUT CONTRAST CT CERVICAL SPINE WITHOUT CONTRAST TECHNIQUE: Multidetector CT imaging of the head, cervical spine, and maxillofacial structures were performed using the standard protocol without intravenous contrast. Multiplanar CT image reconstructions of the cervical spine and maxillofacial structures were also generated. COMPARISON:  Neck CT dated 07/30/2016 FINDINGS: CT HEAD FINDINGS Brain: Mildly enlarged ventricles and cortical sulci. Mild patchy white matter low density in both  cerebral hemispheres. Small right lateral subdural hematoma measuring 4 mm in maximum thickness. 4 mm of midline shift to the left. No mass lesion or CT evidence of acute infarction. Vascular: No hyperdense vessel or unexpected calcification. Skull: Normal. Negative for fracture or focal lesion. Other: None. CT MAXILLOFACIAL FINDINGS Osseous: Mildly comminuted right lateral orbital wall fracture with minimal medial displacement and lateral angulation of the anterior fragment. No other fractures are seen. Orbits: Normal appearing intraorbital contents. Sinuses: Moderate right maxillary sinus mucosal thickening and surgical absence of portions of the medial walls of both maxillary sinuses. Soft tissues: Right periorbital hematoma. CT CERVICAL SPINE FINDINGS Alignment: Normal. Skull base and vertebrae: No acute fracture. No primary bone lesion or focal pathologic process. Soft tissues and spinal canal: No prevertebral fluid or swelling. No visible canal hematoma. Disc levels:  Multilevel degenerative changes. Upper chest: Minimal biapical pleural and parenchymal scarring. Other: Bilateral carotid artery calcifications. IMPRESSION: 1. 4 mm right lateral subdural hematoma and 4 mm of midline shift to the left. 2. Mildly comminuted and minimally displaced right lateral orbital wall fracture. 3. Right periorbital hematoma. 4. No skull fracture. 5. No cervical spine fracture or subluxation. 6. Multilevel cervical spine degenerative changes. 7. Mild diffuse cerebral and cerebellar atrophy and mild chronic small vessel white matter ischemic changes in both cerebral hemispheres. 8. Bilateral carotid artery atheromatous calcifications. Critical Value/emergent results were called by telephone at the time of interpretation on 04/23/2019 at 10:11 pm to Dr. Milton Ferguson , who verbally acknowledged these results. Electronically Signed   By: Claudie Revering M.D.   On: 04/23/2019  22:16   Dg Pelvis Comp Min 3v  Result Date:  04/24/2019 CLINICAL DATA:  Right acetabular fractures. EXAM: JUDET PELVIS - 3+ VIEW COMPARISON:  Pelvis and hip radiograph earlier this day. Abdominal CT yesterday. FINDINGS: Comminuted right acetabular fracture which is minimally displaced superiorly and posteriorly. Fracture extension into the right pubic ramus was better appreciated on CT. Overall alignment is not significantly changed from prior exam. No new abnormalities. IMPRESSION: Unchanged alignment of comminuted right acetabular fracture. Electronically Signed   By: Keith Rake M.D.   On: 04/24/2019 21:00   Ct Hip Right Wo Contrast  Result Date: 04/23/2019 CLINICAL DATA:  Pain EXAM: CT OF THE RIGHT HIP WITHOUT CONTRAST TECHNIQUE: Multidetector CT imaging of the right hip was performed according to the standard protocol. Multiplanar CT image reconstructions were also generated. COMPARISON:  None. FINDINGS: Bones/Joint/Cartilage There is a comminuted acute fracture involving the right acetabulum, primarily involving the anterior column. There is a nondisplaced fracture involving the right inferior pubic ramus. A fracture plane extends through the right superior pubic ramus. There is no dislocation. There is no evidence for displaced fracture involving the proximal right femur Ligaments Suboptimally assessed by CT. Muscles and Tendons No acute abnormality detected. Soft tissues There is a moderate size right-sided pelvic hematoma. Detection of active extravasation is not possible on this noncontrast examination. IMPRESSION: 1. Acute comminuted fracture of the right acetabulum as detailed above. Nondisplaced fracture of the inferior pubic ramus on the right. 2. Moderate size right-sided pelvic hematoma. Electronically Signed   By: Constance Holster M.D.   On: 04/23/2019 22:09   Dg Hip Unilat With Pelvis 2-3 Views Right  Result Date: 04/24/2019 CLINICAL DATA:  Right acetabular fracture. EXAM: DG HIP (WITH OR WITHOUT PELVIS) 2-3V RIGHT COMPARISON:   None. FINDINGS: Minimally displaced fracture of the right acetabulum is seen. Fractures of the right superior and inferior pubic rami are also seen. No evidence of proximal femur fracture or hip dislocation. IMPRESSION: Right acetabular and pubic rami fractures. Electronically Signed   By: Marlaine Hind M.D.   On: 04/24/2019 06:55   Ct Maxillofacial Wo Contrast  Result Date: 04/23/2019 CLINICAL DATA:  Right facial swelling laceration following a fall. Sharp right neck pain and stiffness. EXAM: CT HEAD WITHOUT CONTRAST CT MAXILLOFACIAL WITHOUT CONTRAST CT CERVICAL SPINE WITHOUT CONTRAST TECHNIQUE: Multidetector CT imaging of the head, cervical spine, and maxillofacial structures were performed using the standard protocol without intravenous contrast. Multiplanar CT image reconstructions of the cervical spine and maxillofacial structures were also generated. COMPARISON:  Neck CT dated 07/30/2016 FINDINGS: CT HEAD FINDINGS Brain: Mildly enlarged ventricles and cortical sulci. Mild patchy white matter low density in both cerebral hemispheres. Small right lateral subdural hematoma measuring 4 mm in maximum thickness. 4 mm of midline shift to the left. No mass lesion or CT evidence of acute infarction. Vascular: No hyperdense vessel or unexpected calcification. Skull: Normal. Negative for fracture or focal lesion. Other: None. CT MAXILLOFACIAL FINDINGS Osseous: Mildly comminuted right lateral orbital wall fracture with minimal medial displacement and lateral angulation of the anterior fragment. No other fractures are seen. Orbits: Normal appearing intraorbital contents. Sinuses: Moderate right maxillary sinus mucosal thickening and surgical absence of portions of the medial walls of both maxillary sinuses. Soft tissues: Right periorbital hematoma. CT CERVICAL SPINE FINDINGS Alignment: Normal. Skull base and vertebrae: No acute fracture. No primary bone lesion or focal pathologic process. Soft tissues and spinal canal:  No prevertebral fluid or swelling. No visible canal hematoma. Disc levels:  Multilevel degenerative changes. Upper chest: Minimal biapical pleural and parenchymal scarring. Other: Bilateral carotid artery calcifications. IMPRESSION: 1. 4 mm right lateral subdural hematoma and 4 mm of midline shift to the left. 2. Mildly comminuted and minimally displaced right lateral orbital wall fracture. 3. Right periorbital hematoma. 4. No skull fracture. 5. No cervical spine fracture or subluxation. 6. Multilevel cervical spine degenerative changes. 7. Mild diffuse cerebral and cerebellar atrophy and mild chronic small vessel white matter ischemic changes in both cerebral hemispheres. 8. Bilateral carotid artery atheromatous calcifications. Critical Value/emergent results were called by telephone at the time of interpretation on 04/23/2019 at 10:11 pm to Dr. Milton Ferguson , who verbally acknowledged these results. Electronically Signed   By: Claudie Revering M.D.   On: 04/23/2019 22:16    Anti-infectives: Anti-infectives (From admission, onward)   None      Assessment/Plan Fall Right acetabular fracture - Per Dr. Nelida Meuse - awaiting further recommendations. NWB RLE per current recs. PT/OT Subdural hematoma - per Dr. Ronnald Ramp; no significant change on follow-up CT scan. Will check with NS recs when anti-coagulation can be restarted  Right orbital wall fracture - per Dr. Redmond Baseman. No intervention required.  Right hand pain - plain films Hx of A fib on Coumadin - On hold given SDH. INR wnl Hx COPD - PRN nebs  Hx NICM - Home meds   FEN - Regular  VTE - SCDs, chemical prophylaxis on hold as above ID - None  Plan: F/u right hand xray. Discuss with NS their recs on when Anti-coagulation can be restarted. PT/OT.    LOS: 1 day    Jillyn Ledger , [facility] 04/25/2019, 9:46 AM Pager: [phone]

## 2019-04-26 ENCOUNTER — Other Ambulatory Visit: Payer: Self-pay

## 2019-04-26 LAB — BASIC METABOLIC PANEL
Anion gap: 6 (ref 5–15)
BUN: 12 mg/dL (ref 8–23)
CO2: 24 mmol/L (ref 22–32)
Calcium: 8.4 mg/dL — ABNORMAL LOW (ref 8.9–10.3)
Chloride: 106 mmol/L (ref 98–111)
Creatinine, Ser: 1.1 mg/dL — ABNORMAL HIGH (ref 0.44–1.00)
GFR calc Af Amer: 54 mL/min — ABNORMAL LOW (ref >60)
GFR calc non Af Amer: 47 mL/min — ABNORMAL LOW (ref >60)
Glucose, Bld: 151 mg/dL — ABNORMAL HIGH (ref 70–99)
Potassium: 4.5 mmol/L (ref 3.5–5.1)
Sodium: 136 mmol/L (ref 135–145)

## 2019-04-26 LAB — CBC
HCT: 34.9 % — ABNORMAL LOW (ref 36.0–46.0)
Hemoglobin: 11.4 g/dL — ABNORMAL LOW (ref 12.0–15.0)
MCH: 29.1 pg (ref 26.0–34.0)
MCHC: 32.7 g/dL (ref 30.0–36.0)
MCV: 89 fL (ref 80.0–100.0)
Platelets: 81 10*3/uL — ABNORMAL LOW (ref 150–400)
RBC: 3.92 MIL/uL (ref 3.87–5.11)
RDW: 14.2 % (ref 11.5–15.5)
WBC: 6.2 10*3/uL (ref 4.0–10.5)
nRBC: 0 % (ref 0.0–0.2)

## 2019-04-26 LAB — PROTIME-INR
INR: 1.1 (ref 0.8–1.2)
Prothrombin Time: 14.2 seconds (ref 11.4–15.2)

## 2019-04-26 MED ORDER — POLYETHYLENE GLYCOL 3350 17 G PO PACK
17.0000 g | PACK | Freq: Every day | ORAL | Status: DC
  Administered 2019-04-26 – 2019-05-03 (×7): 17 g via ORAL
  Filled 2019-04-26 (×8): qty 1

## 2019-04-26 MED ORDER — BISACODYL 10 MG RE SUPP: 10.0000 mg | Freq: Every day | RECTAL | Status: DC | PRN

## 2019-04-26 NOTE — Evaluation (Signed)
Physical Therapy Evaluation Patient Details Name: [name] MRN: [id number] DOB: 21-Nov-1933 Today's Date: 04/26/2019   History of Present Illness  Patient transferred from [facility] after sustaining a fall over her dogs onto her R side.  Pt sustained a nondisplaced right orbital wall fracture, right acetabular fracture, very small subdural hematoma and right inferior rami fracture.  PMH: COPD, GERD, asthma.  Clinical Impression  Pt admitted with above. Despite her age and injuries pt moving very well and tolerated OOB to chair great with R LE TTWB. Pt with good home set up and support but will need increased time to achieve supervision level of function, most likely at wheel chair level. Pt demonstrates excellent rehab potential and will be an excellent candidate for CIR upon d/c for maximal functional recovery.    Follow Up Recommendations CIR    Equipment Recommendations  Wheelchair (measurements PT);Wheelchair cushion (measurements PT);Rolling walker with 5" wheels    Recommendations for Other Services Rehab consult     Precautions / Restrictions Precautions Precautions: Fall Required Braces or Orthoses: (buddy tape R digits) Restrictions Weight Bearing Restrictions: Yes RLE Weight Bearing: Touchdown weight bearing      Mobility  Bed Mobility Overal bed mobility: Needs Assistance Bed Mobility: Supine to Sit     Supine to sit: Max assist;+2 for safety/equipment     General bed mobility comments: assist for moving RLE towards EOB and for trunk elevation, requires assist to scoot hips towards EOB  Transfers Overall transfer level: Needs assistance Equipment used: Rolling walker (2 wheeled) Transfers: Sit to/from [facility] Sit to Stand: Mod assist;+2 physical assistance;+2 safety/equipment Stand pivot transfers: Min assist;+2 physical assistance;+2 safety/equipment       General transfer comment: boosting assist from EOB to rise and  steady at RW; pt able to take small hop/pivot on LLE to turn towards recliner, requires cues to push through UEs and to maintain TDWB in RLE; cues for hand/RLE placement with transition to sitting   Ambulation/Gait             General Gait Details: limited to std pvt to chair due to soreness t/o body and R LE NWB  Stairs            Wheelchair Mobility    Modified Rankin (Stroke Patients Only)       Balance Overall balance assessment: Needs assistance Sitting-balance support: Feet supported Sitting balance-Leahy Scale: Good     Standing balance support: Bilateral upper extremity supported Standing balance-Leahy Scale: Poor Standing balance comment: reliant on UE support to maintain TDWB status                             Pertinent Vitals/Pain Pain Assessment: 0-10 Pain Score: 7  Pain Location: right hip Pain Descriptors / Indicators: Sore Pain Intervention(s): Monitored during session    Home Living Family/patient expects to be discharged to:: Private residence Living Arrangements: Other relatives(neice, neice's daughter & granddaughter, neice boyfriend ) Available Help at Discharge: Family;Available 24 hours/day Type of Home: House Home Access: Level entry     Home Layout: One level(1 step from playroom into living room) Home Equipment: Gilford Rile - 2 wheels;Shower seat;Walker - 4 wheels;Grab bars - tub/shower      Prior Function Level of Independence: Independent               Hand Dominance   Dominant Hand: Right    Extremity/Trunk Assessment   Upper Extremity  Assessment Upper Extremity Assessment: RUE deficits/detail RUE Deficits / Details: bruised and edemous R hand, 3rd and 4th digit buddy taped, pt still able to use for gross motor tasks given time/effort RUE Coordination: decreased fine motor    Lower Extremity Assessment Lower Extremity Assessment: Defer to PT evaluation       Communication   Communication: No  difficulties  Cognition Arousal/Alertness: Awake/alert Behavior During Therapy: WFL for tasks assessed/performed Overall Cognitive Status: Within Functional Limits for tasks assessed                                        General Comments General comments (skin integrity, edema, etc.): pt on 2L O2 start of session, SpO2 92% while supine so kept O2 donned with activity to ensure adequate oxygenation     Exercises General Exercises - Lower Extremity Ankle Circles/Pumps: AROM;Both;10 reps Quad Sets: AROM;Both;10 reps;Supine Gluteal Sets: AROM;Both;10 reps;Supine   Assessment/Plan    PT Assessment Patient needs continued PT services  PT Problem List Decreased strength;Decreased range of motion;Decreased activity tolerance;Decreased balance;Decreased mobility;Decreased coordination;Decreased cognition;Decreased knowledge of use of DME;Decreased safety awareness       PT Treatment Interventions DME instruction;Gait training;Stair training;Functional mobility training;Therapeutic activities;Therapeutic exercise    PT Goals (Current goals can be found in the Care Plan section)  Acute Rehab PT Goals Patient Stated Goal: regain independence PT Goal Formulation: With patient Time For Goal Achievement: 05/10/19 Potential to Achieve Goals: Good    Frequency Min 4X/week   Barriers to discharge        Co-evaluation PT/OT/SLP Co-Evaluation/Treatment: Yes Reason for Co-Treatment: Complexity of the patient's impairments (multi-system involvement);For patient/therapist safety;To address functional/ADL transfers PT goals addressed during session: Mobility/safety with mobility         AM-PAC PT "6 Clicks" Mobility  Outcome Measure Help needed turning from your back to your side while in a flat bed without using bedrails?: A Lot Help needed moving from lying on your back to sitting on the side of a flat bed without using bedrails?: A Lot Help needed moving to and from a  bed to a chair (including a wheelchair)?: A Lot Help needed standing up from a chair using your arms (e.g., wheelchair or bedside chair)?: A Lot Help needed to walk in hospital room?: A Lot Help needed climbing 3-5 steps with a railing? : Total 6 Click Score: 11    End of Session Equipment Utilized During Treatment: Gait belt Activity Tolerance: Patient tolerated treatment well Patient left: in chair;with call bell/phone within reach;with chair alarm set Nurse Communication: Mobility status PT Visit Diagnosis: Unsteadiness on feet (R26.81);Difficulty in walking, not elsewhere classified (R26.2);Pain Pain - Right/Left: Right Pain - part of body: Hip    Time: 1132-1206 PT Time Calculation (min) (ACUTE ONLY): 34 min   Charges:   PT Evaluation $PT Eval Moderate Complexity: 1 Mod          Kittie Plater, PT, DPT Acute Rehabilitation Services Pager #: [phone] Office #: [phone]   Berline Lopes 04/26/2019, 1:21 PM

## 2019-04-26 NOTE — Progress Notes (Signed)
Rehab Admissions Coordinator Note:  Patient was screened by Cleatrice Burke for appropriateness for an Inpatient Acute Rehab Consult per PT res.   At this time, we are recommending Inpatient Rehab consultif pt would like to be considered for admit.Please advise.  Cleatrice Burke RN MSN 04/26/2019, 1:29 PM  I can be reached at [phone].

## 2019-04-26 NOTE — Progress Notes (Signed)
Subjective: CC: Sore  Patient denies headache. She reports she is sore "all over", most over her hip and her hand. She is tolerating a diet without any abdominal pain, n/v. She is passing flatus. No BM since 8/15. Reports she has to take metamucil at home and uses suppositories periodically/weekly in order to have a BM at home. Right eye is less swollen today and she can see out of it a little better. Using IS. She has not gotten out of bed yet.   Objective: Vital signs in last 24 hours: Temp:  [98.2 F (36.8 C)-99 F (37.2 C)] 98.2 F (36.8 C) (08/18 0435) Pulse Rate:  [69-77] 77 (08/18 0435) Resp:  [14-20] 18 (08/18 0435) BP: (114-132)/(58-68) 126/64 (08/18 0435) SpO2:  [96 %-99 %] 96 % (08/18 0435) Last BM Date: 04/23/19  Intake/Output from previous day: 08/17 0701 - 08/18 0700 In: 690 [P.O.:690] Out: 1750 [Urine:1750] Intake/Output this shift: No intake/output data recorded.  PE: Gen:  Alert, NAD, pleasant HEENT: Right eye ecchymosis and swelling (swelling improved from yesterday). PERRL. EOMI without entrapment.  Card:  regular rate, irregular rhythm  Pulm:  CTA b/l, normal rate and effort. No rales, wheezing or rhonchi. Pulling 750 on IS  Abd: Soft, NT, ND, +BS Ext:  Right hand with ecchymosis and edema. Buddy tape has fallen off.   No LE edema. DP and radial pulses 2+. Moves all extremities.  Psych: A&Ox3  Skin: no rashes noted, warm and dry  Lab Results:  Recent Labs    04/24/19 0154 04/26/19 0132  WBC 12.4* 6.2  HGB 12.3 11.4*  HCT 38.6 34.9*  PLT 125* 81*   BMET Recent Labs    04/24/19 0154 04/26/19 0132  NA 135 136  K 4.3 4.5  CL 104 106  CO2 21* 24  GLUCOSE 177* 151*  BUN 19 12  CREATININE 1.15* 1.10*  CALCIUM 8.5* 8.4*   PT/INR Recent Labs    04/25/19 0057 04/26/19 0132  LABPROT 14.0 14.2  INR 1.1 1.1   CMP     Component Value Date/Time   NA 136 04/26/2019 0132   K 4.5 04/26/2019 0132   CL 106 04/26/2019 0132   CO2 24  04/26/2019 0132   GLUCOSE 151 (H) 04/26/2019 0132   BUN 12 04/26/2019 0132   CREATININE 1.10 (H) 04/26/2019 0132   CALCIUM 8.4 (L) 04/26/2019 0132   PROT 6.3 (L) 04/24/2019 0154   ALBUMIN 3.1 (L) 04/24/2019 0154   AST 26 04/24/2019 0154   ALT 20 04/24/2019 0154   ALKPHOS 51 04/24/2019 0154   BILITOT 0.7 04/24/2019 0154   GFRNONAA 47 (L) 04/26/2019 0132   GFRAA 54 (L) 04/26/2019 0132   Lipase  No results found for: LIPASE     Studies/Results: Dg Pelvis Comp Min 3v  Result Date: 04/24/2019 CLINICAL DATA:  Right acetabular fractures. EXAM: JUDET PELVIS - 3+ VIEW COMPARISON:  Pelvis and hip radiograph earlier this day. Abdominal CT yesterday. FINDINGS: Comminuted right acetabular fracture which is minimally displaced superiorly and posteriorly. Fracture extension into the right pubic ramus was better appreciated on CT. Overall alignment is not significantly changed from prior exam. No new abnormalities. IMPRESSION: Unchanged alignment of comminuted right acetabular fracture. Electronically Signed   By: Keith Rake M.D.   On: 04/24/2019 21:00   Dg Hand Complete Right  Result Date: 04/25/2019 CLINICAL DATA:  Right middle finger pain after fall. EXAM: RIGHT HAND - COMPLETE 3+ VIEW COMPARISON:  None. FINDINGS: Small  minimally displaced fracture is seen involving the proximal base of the third proximal phalanx. No other bony abnormality is noted. Joint spaces are otherwise unremarkable. IMPRESSION: Minimally displaced fracture involving proximal base of third proximal phalanx. Electronically Signed   By: Marijo Conception M.D.   On: 04/25/2019 10:40    Anti-infectives: Anti-infectives (From admission, onward)   None       Assessment/Plan Fall Right acetabular fracture - Per Dr. Stann Mainland- TDWB RLE x 2 months. F/u 2 weeks. PT/OT Subdural hematoma - per Dr. Ronnald Ramp; no significant change on follow-up CT scan.  To remain off coumadin for at least another week until another repeat CT scan  of her head Right orbital wall fracture - per Dr. Redmond Baseman. No intervention required.  Right 3rd phalanx fx -  Per Dr. Stann Mainland - Non-op, buddy tape Hx of A fib on Coumadin - On hold given SDH. INR wnl Hx COPD - PRN nebs  Hx NICM - Home meds   FEN - Regular  VTE - SCDs, chemical prophylaxis on hold as above ID - None  Dispo: PT/OT   LOS: 2 days    Jillyn Ledger , [facility] 04/26/2019, 8:08 AM Pager: [phone]

## 2019-04-26 NOTE — Evaluation (Signed)
Speech Language Pathology Evaluation Patient Details Name: [name] MRN: [id number] DOB: [birth year] Today's Date: 04/26/2019 Time: 1050-1100 SLP Time Calculation (min) (ACUTE ONLY): 10 min  Problem List:  Patient Active Problem List   Diagnosis Date Noted  . Fall 04/24/2019  . Pneumonia involving right lung 06/18/2018  . Hoarseness 07/22/2015  . Acute rhinitis 03/18/2015  . Sinusitis, acute maxillary 04/02/2014  . Encounter for therapeutic drug monitoring 10/05/2013  . Shingles 09/06/2013  . Postherpetic neuralgia 09/04/2013  . Arthritis, lumbar spine 04/20/2013  . Back pain 04/20/2013  . Mild anxiety 08/20/2012  . Allergic rhinitis due to pollen 11/30/2011  . Lung nodules 11/30/2011  . Renal insufficiency 01/16/2011  . Long term current use of anticoagulant 12/19/2010  . ANEMIA 10/09/2010  . Secondary cardiomyopathy ([place]) 06/21/2010  . Atrial fibrillation ([place]) 05/28/2010  . MITRAL REGURGITATION 05/25/2008  . BARRETT'S ESOPHAGUS, HX OF 05/25/2008  . Asthma with COPD (chronic obstructive pulmonary disease) ([place]) 11/12/2007  . Obstructive sleep apnea 11/12/2007  . MITRAL VALVE PROLAPSE 09/03/2007   Past Medical History:  Past Medical History:  Diagnosis Date  . Asthma   . Atrial fibrillation ([place])   . Chronic rhinitis   . COPD (chronic obstructive pulmonary disease) ([place])   . GERD (gastroesophageal reflux disease)   . Mitral regurgitation   . Mitral valve prolapse   . Nasal polyps   . Nonischemic cardiomyopathy ([facility])    LVEF 25-30% improved to 45-50%  . Obstructive sleep apnea   . Transudative pleural effusion    Past Surgical History:  Past Surgical History:  Procedure Laterality Date  . APPENDECTOMY  2000  . Bilateral cataract surgery    . LAPAROSCOPIC NISSEN FUNDOPLICATION  8768  . NOSE SURGERY     HPI:  Patient is an 85  y.o. female with PMH: asthma, afib, chronic rhinitis, COPD, GERD, mitral regurgitation and valve prolapse, nasal polyps, OSA,  non-ischemic cardiomyopathy. On 04/23/19, her two dogs caused her to fall on her right side. During hospital assessments, she was found to have suffered a right-sided SDH, right hip fracture and right orbital wall fracture.   Assessment / Plan / Recommendation Clinical Impression  Patient presents with cognitive-linguistic and speech abilities that are all WNL. Memory/recall was intact, she did not exhibit any deficits in reasoning or safety awareness, was fully oriented x4 and is able to discuss medical information related to this hospitalization without difficulty. She stated that she is a little worried about how she will be able to do with standing and walking secondary to her right hip fracture. SLP emphasized the importance of reducing fall risk at home (She said she has a small poodle and a big husky at home, but they belong to her neices who live in the home) and discussed the danger of having a second fall,especially if she fell on her already injured right side. Patient verbalized understanding and demonstrated appropriate concern for her safety during our discussion. From a cognitive-linguistic standpoint, she appears at her baseline and is able to independently make decisions.    SLP Assessment  SLP Recommendation/Assessment: Patient does not need any further Speech Lanaguage Pathology Services SLP Visit Diagnosis: Cognitive communication deficit (R41.841)    Follow Up Recommendations  None    Frequency and Duration     N/A      SLP Evaluation Cognition  Overall Cognitive Status: Within Functional Limits for tasks assessed Arousal/Alertness: Awake/alert Orientation Level: Oriented X4 Memory: Appears intact Executive Function: Reasoning;Decision Making Reasoning: Appears intact Decision  Making: Appears intact Safety/Judgment: Appears intact       Comprehension  Auditory Comprehension Overall Auditory Comprehension: Appears within functional limits for tasks assessed     Expression Expression Primary Mode of Expression: Verbal   Oral / Motor  Oral Motor/Sensory Function Overall Oral Motor/Sensory Function: Within functional limits   GO                    Dannial Monarch 04/26/2019, 11:35 AM    Sonia Baller, MA, CCC-SLP Speech Therapy [facility] Acute Rehab Pager: [phone]

## 2019-04-26 NOTE — Evaluation (Signed)
Occupational Therapy Evaluation Patient Details Name: [name] MRN: [id number] DOB: 1934-08-04 Today's Date: 04/26/2019    History of Present Illness (P) Patient transferred from [facility] after sustaining a fall over her dogs onto her R side.  Pt sustained a nondisplaced right orbital wall fracture, right acetabular fracture, very small subdural hematoma and right inferior rami fracture.  PMH: COPD, GERD, asthma.   Clinical Impression   This 85 y/o female presents with the above. PTA pt reports she is independent with ADL and functional mobility. Pt currently presents with pain, weakness, decreased standing balance impacting her functional performance. Pt currently requires two person min-modA for safe completion of functional transfers using RW. She requires up to maxA for LB ADL, minguard assist for seated UB ADL. Pt very pleasant and motivated to work with therapies, reports she has good family support who are able to assist at time of discharge. Pt will benefit from continued acute OT services and feel she is an excellent candidate for CIR level services at time of discharge to maximize her safety and independence with ADL and mobility prior to return home. Will follow.     Follow Up Recommendations  CIR;Supervision/Assistance - 24 hour    Equipment Recommendations  3 in 1 bedside commode;Other (comment)(TBD in next venue)           Precautions / Restrictions Precautions Precautions: Fall Required Braces or Orthoses: (buddy tape R digits) Restrictions Weight Bearing Restrictions: Yes RLE Weight Bearing: Touchdown weight bearing      Mobility Bed Mobility Overal bed mobility: Needs Assistance Bed Mobility: Supine to Sit     Supine to sit: Max assist;+2 for safety/equipment     General bed mobility comments: assist for moving RLE towards EOB and for trunk elevation, requires assist to scoot hips towards EOB  Transfers Overall transfer level: Needs  assistance Equipment used: Rolling walker (2 wheeled) Transfers: Sit to/from [facility] Sit to Stand: Mod assist;+2 physical assistance;+2 safety/equipment Stand pivot transfers: Min assist;+2 physical assistance;+2 safety/equipment       General transfer comment: boosting assist from EOB to rise and steady at RW; pt able to take small hop/pivot on LLE to turn towards recliner, requires cues to push through UEs and to maintain TDWB in RLE; cues for hand/RLE placement with transition to sitting     Balance Overall balance assessment: Needs assistance Sitting-balance support: Feet supported Sitting balance-Leahy Scale: Good     Standing balance support: Bilateral upper extremity supported Standing balance-Leahy Scale: Poor Standing balance comment: reliant on UE support to maintain TDWB status                           ADL either performed or assessed with clinical judgement   ADL Overall ADL's : Needs assistance/impaired Eating/Feeding: Set up;Sitting   Grooming: Set up;Sitting;Wash/dry face   Upper Body Bathing: Min guard;Sitting   Lower Body Bathing: Moderate assistance;Maximal assistance;Sit to/from stand;+2 for safety/equipment;+2 for physical assistance Lower Body Bathing Details (indicate cue type and reason): assist to wash backside in standing as pt with soiled bed linens upon arrival; minA (+2) for standing balance Upper Body Dressing : Set up;Min guard;Sitting   Lower Body Dressing: +2 for physical assistance;+2 for safety/equipment;Maximal assistance;Sit to/from stand Lower Body Dressing Details (indicate cue type and reason): minA +2 for standing balance; pt was able to don L sock using foward chaining method (therapist starting sock over toes), she required assist to don  R sock Toilet Transfer: Minimal assistance;+2 for physical assistance;+2 for safety/equipment;Stand-pivot;RW Toilet Transfer Details (indicate cue type and reason):  simulated via transfer to [place] and Hygiene: Moderate assistance;Maximal assistance;+2 for physical assistance;+2 for safety/equipment;Sit to/from stand       Functional mobility during ADLs: Minimal assistance;+2 for physical assistance;+2 for safety/equipment;Rolling walker(stand pivot transfer) General ADL Comments: pt with pain and weakness, requires cues for TDWB status     Vision Baseline Vision/History: No visual deficits Patient Visual Report: Blurring of vision(pt reports improved) Additional Comments: pt reports she is now able to open R eye (was previously unable to); reports mild blurriness but states it has improved. R eye bruised and swollen     Perception     Praxis      Pertinent Vitals/Pain Pain Assessment: 0-10 Pain Score: 7  Pain Location: right hip Pain Descriptors / Indicators: Sore Pain Intervention(s): Monitored during session     Hand Dominance Right   Extremity/Trunk Assessment Upper Extremity Assessment Upper Extremity Assessment: RUE deficits/detail RUE Deficits / Details: bruised and edemous R hand, 3rd and 4th digit buddy taped, pt still able to use for gross motor tasks given time/effort RUE Coordination: decreased fine motor   Lower Extremity Assessment Lower Extremity Assessment: Defer to PT evaluation       Communication Communication Communication: No difficulties   Cognition Arousal/Alertness: Awake/alert Behavior During Therapy: WFL for tasks assessed/performed Overall Cognitive Status: Within Functional Limits for tasks assessed                                     General Comments  pt on 2L O2 start of session, SpO2 92% while supine so kept O2 donned with activity to ensure adequate oxygenation     Exercises Exercises: (P) General Lower Extremity   Shoulder Instructions      Home Living Family/patient expects to be discharged to:: Private residence Living Arrangements:  Other relatives(neice, neice's daughter & granddaughter, neice boyfriend ) Available Help at Discharge: Family;Available 24 hours/day Type of Home: House Home Access: Level entry     Home Layout: One level(1 step from playroom into living room)     Bathroom Shower/Tub: Walk-in shower         Home Equipment: Environmental consultant - 2 wheels;Shower seat;Walker - 4 wheels;Grab bars - tub/shower      Lives With: Family;Other (Comment)(patient reported she lives with her two neices)    Prior Functioning/Environment Level of Independence: Independent                 OT Problem List: Decreased strength;Decreased range of motion;Decreased activity tolerance;Impaired balance (sitting and/or standing);Decreased safety awareness;Decreased knowledge of use of DME or AE;Decreased knowledge of precautions;Pain;Impaired UE functional use      OT Treatment/Interventions: Self-care/ADL training;Therapeutic exercise;Neuromuscular education;DME and/or AE instruction;Therapeutic activities;Patient/family education;Balance training    OT Goals(Current goals can be found in the care plan section) Acute Rehab OT Goals Patient Stated Goal: regain independence OT Goal Formulation: With patient Time For Goal Achievement: 05/10/19 Potential to Achieve Goals: Good  OT Frequency: Min 2X/week   Barriers to D/C:            Co-evaluation PT/OT/SLP Co-Evaluation/Treatment: Yes Reason for Co-Treatment: Complexity of the patient's impairments (multi-system involvement);For patient/therapist safety;To address functional/ADL transfers PT goals addressed during session: (P) Mobility/safety with mobility        AM-PAC OT "6 Clicks" Daily Activity  Outcome Measure Help from another person eating meals?: None Help from another person taking care of personal grooming?: A Little Help from another person toileting, which includes using toliet, bedpan, or urinal?: A Lot Help from another person bathing (including  washing, rinsing, drying)?: A Lot Help from another person to put on and taking off regular upper body clothing?: A Little Help from another person to put on and taking off regular lower body clothing?: A Lot 6 Click Score: 16   End of Session Equipment Utilized During Treatment: Gait belt;Rolling walker;Oxygen Nurse Communication: Mobility status;Precautions  Activity Tolerance: Patient tolerated treatment well Patient left: in chair;with call bell/phone within reach;with chair alarm set  OT Visit Diagnosis: Unsteadiness on feet (R26.81);Muscle weakness (generalized) (M62.81);Pain Pain - Right/Left: Right Pain - part of body: Hip;Hand                Time: 1132-1206 OT Time Calculation (min): 34 min Charges:  OT General Charges $OT Visit: 1 Visit OT Evaluation $OT Eval Moderate Complexity: [place], OT [employer] Pager [phone] Office [place] 04/26/2019, 1:19 PM

## 2019-04-26 NOTE — TOC Initial Note (Signed)
Transition of Care [facility]) - Initial/Assessment Note    Patient Details  Name: [name] MRN: [id number] Date of Birth: 08/17/1934  [facility] CM/SW Contact:    Alexander [place], [place] Phone Number: 04/26/2019, 11:57 AM  Clinical Narrative:                 CSW met with pt at bedside. Introduced self, role, reason for visit. Pt from home with her 2 nieces and one of their children. Pt has two dogs in the home which she attributes to her fall. She has good support from those two nieces and feels like she will have 24/7 care at discharge. We discussed that pt would work with therapies and they would make recommendations. She does not appear to be pleased with the idea of going to SNF but CSW let her know that we would follow as needed to make a plan with pt based on therapy recs. She has multiple pieces of equipment at home including a front wheel walker, a rollator, a cane and a 3 in 1.   SBIRT complete, no needs noted. Will follow for therapy recommendations.  Expected Discharge Plan: [place] Barriers to Discharge: Insurance Authorization, Continued Medical Work up   Patient Goals and CMS Choice Patient states their goals for this hospitalization and ongoing recovery are:: to feel better CMS Medicare.gov Compare Post Acute Care list provided to:: Patient Choice offered to / list presented to : Patient  Expected Discharge Plan and Services Expected Discharge Plan: [place] In-house Referral: Clinical Social Work Discharge Planning Services: CM Consult, Follow-up appt scheduled Post Acute Care Choice: Durable Medical Equipment, Home Health Living arrangements for the past 2 months: Single Family Home                                      Prior Living Arrangements/Services Living arrangements for the past 2 months: Single Family Home Lives with:: Relatives, Pets Patient language and need for interpreter reviewed:: Yes(no  needs) Do you feel safe going back to the place where you live?: Yes      Need for Family Participation in Patient Care: Yes (Comment)(assistance with IADLs) Care giver support system in place?: Yes (comment) Current home services: DME Criminal Activity/Legal Involvement Pertinent to Current Situation/Hospitalization: No - Comment as needed  Activities of Daily Living      Permission Sought/Granted Permission sought to share information with : Family Supports, PCP Permission granted to share information with : Yes, Verbal Permission Granted  Share Information with NAME: [place] granted to share info w AGENCY: PCP  Permission granted to share info w Relationship: neice  Permission granted to share info w Contact Information: [phone]  Emotional Assessment Appearance:: Appears stated age Attitude/Demeanor/Rapport: Engaged, Gracious Affect (typically observed): Accepting, Adaptable, Appropriate, Pleasant Orientation: : Oriented to Self, Oriented to Place, Oriented to  Time, Oriented to Situation Alcohol / Substance Use: Not Applicable Psych Involvement: No (comment)  Admission diagnosis:  Fall, initial encounter [W19.XXXA] Patient Active Problem List   Diagnosis Date Noted  . Fall 04/24/2019  . Pneumonia involving right lung 06/18/2018  . Hoarseness 07/22/2015  . Acute rhinitis 03/18/2015  . Sinusitis, acute maxillary 04/02/2014  . Encounter for therapeutic drug monitoring 10/05/2013  . Shingles 09/06/2013  . Postherpetic neuralgia 09/04/2013  . Arthritis, lumbar spine 04/20/2013  . Back pain 04/20/2013  .  Mild anxiety 08/20/2012  . Allergic rhinitis due to pollen 11/30/2011  . Lung nodules 11/30/2011  . Renal insufficiency 01/16/2011  . Long term current use of anticoagulant 12/19/2010  . ANEMIA 10/09/2010  . Secondary cardiomyopathy ([place]) 06/21/2010  . Atrial fibrillation ([place]) 05/28/2010  . MITRAL REGURGITATION 05/25/2008  . BARRETT'S ESOPHAGUS, HX  OF 05/25/2008  . Asthma with COPD (chronic obstructive pulmonary disease) ([place]) 11/12/2007  . Obstructive sleep apnea 11/12/2007  . MITRAL VALVE PROLAPSE 09/03/2007   PCP:  Sharilyn Sites, MD Pharmacy:   [id number] [place] [address]Phone: [phone]: [phone] [facility] [address] [address]Phone: [phone]: [address]Phone: [phone]: [phone] [facility] - [place]Phone: [phone]: [phone]    Social Determinants of Health (SDOH) Interventions    Readmission Risk Interventions No flowsheet data found.

## 2019-04-27 LAB — PROTIME-INR
INR: 1.2 (ref 0.8–1.2)
Prothrombin Time: 14.8 seconds (ref 11.4–15.2)

## 2019-04-27 NOTE — Progress Notes (Signed)
Physical Therapy Treatment Patient Details Name: [name] MRN: [id number] DOB: 28-Nov-1933 Today's Date: 04/27/2019    History of Present Illness Patient transferred from [facility] after sustaining a fall over her dogs onto her R side.  Pt sustained a nondisplaced right orbital wall fracture, right acetabular fracture, very small subdural hematoma and right inferior rami fracture.  PMH: COPD, GERD, asthma.    PT Comments    Pt making steady progress with functional mobility; however, remains limited to transfers only for mobility as she has difficulty hoping with RW to maintain TDWB R LE. Pt overall very pleasant and highly motivated to work with therapy. Pt would continue to benefit from skilled physical therapy services at this time while admitted and after d/c to address the below listed limitations in order to improve overall safety and independence with functional mobility.    Follow Up Recommendations  CIR     Equipment Recommendations  Wheelchair (measurements PT);Wheelchair cushion (measurements PT);Rolling walker with 5" wheels(w/c with elevating leg rests)    Recommendations for Other Services       Precautions / Restrictions Precautions Precautions: Fall Restrictions Weight Bearing Restrictions: Yes RLE Weight Bearing: Touchdown weight bearing    Mobility  Bed Mobility Overal bed mobility: Needs Assistance Bed Mobility: Supine to Sit     Supine to sit: Mod assist     General bed mobility comments: HOB elevated, assistance needed for R LE movement with use of bed pads to rotate hips to position at EOB; pt able to use bilateral UEs on handrails of bed  Transfers Overall transfer level: Needs assistance Equipment used: Rolling walker (2 wheeled) Transfers: Sit to/from [facility] Sit to Stand: Mod assist;From elevated surface Stand pivot transfers: Min assist       General transfer comment: cueing for safe hand placement, assist  needed to power into standing from sitting EOB and for stability with pivotal movements to chair towards pt's L side  Ambulation/Gait             General Gait Details: pt unable to hop at this time; only able to pivot on L LE for transfers   Stairs             Wheelchair Mobility    Modified Rankin (Stroke Patients Only)       Balance Overall balance assessment: Needs assistance Sitting-balance support: Feet supported Sitting balance-Leahy Scale: Good     Standing balance support: Bilateral upper extremity supported Standing balance-Leahy Scale: Poor Standing balance comment: reliant on UE support to maintain TDWB status                            Cognition Arousal/Alertness: Awake/alert Behavior During Therapy: WFL for tasks assessed/performed Overall Cognitive Status: Within Functional Limits for tasks assessed                                        Exercises General Exercises - Lower Extremity Ankle Circles/Pumps: AROM;Right;10 reps;Seated Cunningham Arc Quad: AAROM;Right;10 reps;Seated    General Comments        Pertinent Vitals/Pain Pain Assessment: 0-10 Pain Score: 7  Pain Location: right hip Pain Descriptors / Indicators: Sore Pain Intervention(s): Monitored during session;Repositioned    Home Living   Living Arrangements: (niece, [name], great niece live with patient)  Home Layout: One level        Prior Function            PT Goals (current goals can now be found in the care plan section) Acute Rehab PT Goals PT Goal Formulation: With patient Time For Goal Achievement: 05/10/19 Potential to Achieve Goals: Good Progress towards PT goals: Progressing toward goals    Frequency    Min 4X/week      PT Plan Current plan remains appropriate    Co-evaluation              AM-PAC PT "6 Clicks" Mobility   Outcome Measure  Help needed turning from your back to your side while in a flat bed  without using bedrails?: A Lot Help needed moving from lying on your back to sitting on the side of a flat bed without using bedrails?: A Lot Help needed moving to and from a bed to a chair (including a wheelchair)?: A Lot Help needed standing up from a chair using your arms (e.g., wheelchair or bedside chair)?: A Lot Help needed to walk in hospital room?: A Lot Help needed climbing 3-5 steps with a railing? : Total 6 Click Score: 11    End of Session Equipment Utilized During Treatment: Gait belt Activity Tolerance: Patient tolerated treatment well Patient left: in chair;with call bell/phone within reach;with chair alarm set Nurse Communication: Mobility status PT Visit Diagnosis: Unsteadiness on feet (R26.81);Difficulty in walking, not elsewhere classified (R26.2);Pain Pain - Right/Left: Right Pain - part of body: Hip     Time: 1101-1120 PT Time Calculation (min) (ACUTE ONLY): 19 min  Charges:  $Therapeutic Activity: 8-22 mins                     [name], PT, DPT  Acute Rehabilitation Services Pager [phone] Office [place] 04/27/2019, 2:20 PM

## 2019-04-27 NOTE — Progress Notes (Signed)
Subjective: CC: Right hip pain Patient doing well this morning. Worked with therapies yesterday and reported some soreness in her right hip after the session was over. Otherwise feels pain is improving. No new areas of pain. She is able to open her right eye more and denies visual changes. She denies HA. She is tolerating a diet without any N/V. She is passing flatus. Still no BM since 8/15. Using IS.   Objective: Vital signs in last 24 hours: Temp:  [98.2 F (36.8 C)-98.5 F (36.9 C)] 98.2 F (36.8 C) (08/19 0532) Pulse Rate:  [69-88] 74 (08/19 0532) Resp:  [17-18] 18 (08/19 0532) BP: (103-112)/(56-72) 109/67 (08/19 0532) SpO2:  [91 %-95 %] 93 % (08/19 0532) Last BM Date: 04/23/19  Intake/Output from previous day: 08/18 0701 - 08/19 0700 In: 2094.1 [P.O.:360; I.V.:1734.1] Out: 650 [Urine:650] Intake/Output this shift: No intake/output data recorded.  PE: Gen: Alert, NAD, pleasant HEENT:Right eye ecchymosis and swelling (swelling improved from yesterday). PERRL. EOMI without entrapment. Card:regular rate, irregular rhythm  Pulm:CTA b/l, normal rate and effort. No rales, wheezing or rhonchi. Pulling 750 on IS Abd: Soft, NT,ND, +BS ZOX:WRUEA hand with ecchymosis and edema. Buddy tape in place   NoLE edema. DP and radial pulses 2+. Moves all extremities.  Psych: A&Ox3  Skin: no rashes noted, warm and dry  Lab Results:  Recent Labs    04/26/19 0132  WBC 6.2  HGB 11.4*  HCT 34.9*  PLT 81*   BMET Recent Labs    04/26/19 0132  NA 136  K 4.5  CL 106  CO2 24  GLUCOSE 151*  BUN 12  CREATININE 1.10*  CALCIUM 8.4*   PT/INR Recent Labs    04/26/19 0132 04/27/19 0455  LABPROT 14.2 14.8  INR 1.1 1.2   CMP     Component Value Date/Time   NA 136 04/26/2019 0132   K 4.5 04/26/2019 0132   CL 106 04/26/2019 0132   CO2 24 04/26/2019 0132   GLUCOSE 151 (H) 04/26/2019 0132   BUN 12 04/26/2019 0132   CREATININE 1.10 (H) 04/26/2019 0132   CALCIUM 8.4 (L) 04/26/2019 0132   PROT 6.3 (L) 04/24/2019 0154   ALBUMIN 3.1 (L) 04/24/2019 0154   AST 26 04/24/2019 0154   ALT 20 04/24/2019 0154   ALKPHOS 51 04/24/2019 0154   BILITOT 0.7 04/24/2019 0154   GFRNONAA 47 (L) 04/26/2019 0132   GFRAA 54 (L) 04/26/2019 0132   Lipase  No results found for: LIPASE     Studies/Results: Dg Hand Complete Right  Result Date: 04/25/2019 CLINICAL DATA:  Right middle finger pain after fall. EXAM: RIGHT HAND - COMPLETE 3+ VIEW COMPARISON:  None. FINDINGS: Small minimally displaced fracture is seen involving the proximal base of the third proximal phalanx. No other bony abnormality is noted. Joint spaces are otherwise unremarkable. IMPRESSION: Minimally displaced fracture involving proximal base of third proximal phalanx. Electronically Signed   By: Marijo Conception M.D.   On: 04/25/2019 10:40    Anti-infectives: Anti-infectives (From admission, onward)   None       Assessment/Plan Fall Right acetabular fracture- Per Dr. Stann Mainland- TDWB RLE x 2 months. F/u 2 weeks. PT/OT Subdural hematoma- per Dr. Ronnald Ramp; no significant change on follow-up CT scan.  To remain off coumadin for at least another week until another repeat CT scan of her head Right orbital wall fracture - per Dr. Redmond Baseman. No intervention required.  Right 3rd phalanx fx-  Per Dr. Stann Mainland -  Non-op, buddy tape Hx of A fib on Coumadin- On hold given SDH. INR wnl Hx COPD- PRN nebs  Hx NICM - Home meds  FEN -Regular VTE -SCDs, chemical prophylaxis on hold as above ID -None  Dispo:PT/OT. CIR consult.    LOS: 3 days    Jillyn Ledger , [facility] 04/27/2019, 8:07 AM Pager: [phone]

## 2019-04-27 NOTE — Progress Notes (Signed)
Inpatient Rehabilitation Admissions Coordinator  Inpatient rehab consult received. I met with patient at bedside for rehab assessment. We discussed goals and expectations of an inpt rehab admit. Pt prefers inpt rehab and does not want SNF. Has 24/7 assist at home. I will begin insurance approval today once I have updated therapy notes for possible admit over next 1 to 2 days pending insurance approval and medical clearance.  Barbara Boyette, RN, MSN Rehab Admissions Coordinator [phone] 04/27/2019 11:30 AM  

## 2019-04-28 ENCOUNTER — Inpatient Hospital Stay (HOSPITAL_COMMUNITY): Payer: Medicare PPO

## 2019-04-28 MED ORDER — IPRATROPIUM-ALBUTEROL 0.5-2.5 (3) MG/3ML IN SOLN
3.0000 mL | Freq: Once | RESPIRATORY_TRACT | Status: AC
  Administered 2019-04-28: 09:00:00 3 mL via RESPIRATORY_TRACT
  Filled 2019-04-28: qty 3

## 2019-04-28 MED ORDER — BISACODYL 10 MG RE SUPP
10.0000 mg | Freq: Once | RECTAL | Status: AC
  Administered 2019-04-28: 10 mg via RECTAL
  Filled 2019-04-28: qty 1

## 2019-04-28 NOTE — Progress Notes (Signed)
Patient off unit to CT scan.

## 2019-04-28 NOTE — Progress Notes (Addendum)
Inpatient Rehabilitation Admissions Coordinator  I await insurance approval for CIR.  Danne Baxter, RN, MSN Rehab Admissions Coordinator [phone] 04/28/2019 1:54 PM   I have received an initial denial from insurance...patient unaware. Dr. Grandville Silos has agreed to a peer to peer with insurance MD which I have arranged. I will follow up after that call is complete.  Danne Baxter, RN, MSN Rehab Admissions Coordinator [phone] 04/28/2019 2:51 PM

## 2019-04-28 NOTE — Progress Notes (Signed)
Physical Therapy Treatment Patient Details Name: [name] MRN: [id number] DOB: 05/24/1934 Today's Date: 04/28/2019    History of Present Illness Patient transferred from [facility] after sustaining a fall over her dogs onto her R side.  Pt sustained a nondisplaced right orbital wall fracture, right acetabular fracture, very small subdural hematoma and right inferior rami fracture.  PMH: COPD, GERD, asthma.    PT Comments    Pt performed bed exercises and transfers from bed to recliner with min to moderate assistance.  She continues to lack confidence to progress gt. Pt reports feeling more confident with transfers and she required decreased assistance to move to edge of bed,  Plan next session for gt training with close chair follow.  Pt with productive cough x3 during session with white sputum noted.  Will continue to recommend CIR for aggressive therapies to improve strength and function before returning home.    Follow Up Recommendations  CIR     Equipment Recommendations  Wheelchair (measurements PT);Wheelchair cushion (measurements PT);Rolling walker with 5" wheels(w/c with elevating leg rests.)    Recommendations for Other Services       Precautions / Restrictions Precautions Precautions: Fall Restrictions Weight Bearing Restrictions: Yes RLE Weight Bearing: Touchdown weight bearing    Mobility  Bed Mobility Overal bed mobility: Needs Assistance Bed Mobility: Supine to Sit;Sit to Supine     Supine to sit: Min assist     General bed mobility comments: HOB elevated but able to advance LEs to edge of bed min assistance to move hips closer to the bed once sitting upright  Transfers Overall transfer level: Needs assistance Equipment used: Rolling walker (2 wheeled) Transfers: Sit to/from [facility] Sit to Stand: Mod assist Stand pivot transfers: Min assist       General transfer comment: cueing for safe hand placement, assist needed to  power into standing from sitting EOB and for stability with pivotal movements to chair towards pt's L side  Ambulation/Gait                 Stairs             Wheelchair Mobility    Modified Rankin (Stroke Patients Only)       Balance Overall balance assessment: Needs assistance   Sitting balance-Leahy Scale: Good       Standing balance-Leahy Scale: Poor Standing balance comment: reliant on UE support to maintain TDWB status                            Cognition Arousal/Alertness: Awake/alert Behavior During Therapy: WFL for tasks assessed/performed Overall Cognitive Status: Within Functional Limits for tasks assessed                                        Exercises General Exercises - Lower Extremity Ankle Circles/Pumps: AROM;Both;10 reps;Supine Quad Sets: AROM;Both;10 reps;Supine Long Arc Quad: AAROM;Right;10 reps;Seated;Left    General Comments        Pertinent Vitals/Pain Pain Assessment: 0-10 Pain Score: 6  Pain Location: right hip, R rib cage Pain Descriptors / Indicators: Sharp;Sore;Discomfort;Grimacing;Guarding Pain Intervention(s): Monitored during session;Repositioned    Home Living                      Prior Function  PT Goals (current goals can now be found in the care plan section) Acute Rehab PT Goals Patient Stated Goal: regain independence PT Goal Formulation: With patient Potential to Achieve Goals: Good Progress towards PT goals: Progressing toward goals    Frequency    Min 4X/week      PT Plan Current plan remains appropriate    Co-evaluation              AM-PAC PT "6 Clicks" Mobility   Outcome Measure  Help needed turning from your back to your side while in a flat bed without using bedrails?: A Little Help needed moving from lying on your back to sitting on the side of a flat bed without using bedrails?: A Little Help needed moving to and from a bed to a  chair (including a wheelchair)?: A Little Help needed standing up from a chair using your arms (e.g., wheelchair or bedside chair)?: A Lot Help needed to walk in hospital room?: Total Help needed climbing 3-5 steps with a railing? : Total 6 Click Score: 13    End of Session Equipment Utilized During Treatment: Gait belt Activity Tolerance: Patient tolerated treatment well Patient left: in chair;with call bell/phone within reach;with chair alarm set Nurse Communication: Mobility status PT Visit Diagnosis: Unsteadiness on feet (R26.81);Difficulty in walking, not elsewhere classified (R26.2);Pain Pain - Right/Left: Right Pain - part of body: Hip     Time: 4388-8757 PT Time Calculation (min) (ACUTE ONLY): 26 min  Charges:  $Therapeutic Exercise: 8-22 mins $Therapeutic Activity: 8-22 mins                     Governor Rooks, PTA Acute Rehabilitation Services Pager [phone] Office [phone]     [name] 04/28/2019, 3:43 PM

## 2019-04-28 NOTE — Progress Notes (Signed)
Subjective: CC: Right hip pain Patient doing well. In good spirits. Met with CIR yesterday who is now working on Ship broker. She notes that her pain is improving. She has some soreness in her right hip. No HA or visual changes. She is tolerating a diet without any n/v. She is passing flatus. Still no BM since 8/15. Using IS.   Objective: Vital signs in last 24 hours: Temp:  [98.6 F (37 C)-99.3 F (37.4 C)] 98.6 F (37 C) (08/20 0523) Pulse Rate:  [55-73] 67 (08/20 0523) Resp:  [16-18] 16 (08/20 0523) BP: (111-121)/(52-69) 116/64 (08/20 0523) SpO2:  [95 %-100 %] 100 % (08/20 0523) Last BM Date: 04/23/19  Intake/Output from previous day: 08/19 0701 - 08/20 0700 In: 1350 [P.O.:680; I.V.:670] Out: 600 [Urine:600] Intake/Output this shift: No intake/output data recorded.  PE: Gen: Alert, NAD, pleasant HEENT:Right eye ecchymosis and swelling(swelling improved from yesterday). PERRL. EOMI without entrapment. Card:regular rate, irregular rhythm Pulm:Upper airway congestion with some expiratory wheezing. Some clearance after coughing. Normal rate and effort. No rales. Pulling 750 on IS Abd: Soft, NT,ND, +BS OZH:YQMVH hand with ecchymosis and edema.Buddy tape in place  NoLE edema. DP and radial pulses 2+. Moves all extremities. Psych: A&Ox3  Skin: no rashes noted, warm and dry.  Lab Results:  Recent Labs    04/26/19 0132  WBC 6.2  HGB 11.4*  HCT 34.9*  PLT 81*   BMET Recent Labs    04/26/19 0132  NA 136  K 4.5  CL 106  CO2 24  GLUCOSE 151*  BUN 12  CREATININE 1.10*  CALCIUM 8.4*   PT/INR Recent Labs    04/26/19 0132 04/27/19 0455  LABPROT 14.2 14.8  INR 1.1 1.2   CMP     Component Value Date/Time   NA 136 04/26/2019 0132   K 4.5 04/26/2019 0132   CL 106 04/26/2019 0132   CO2 24 04/26/2019 0132   GLUCOSE 151 (H) 04/26/2019 0132   BUN 12 04/26/2019 0132   CREATININE 1.10 (H) 04/26/2019 0132   CALCIUM 8.4 (L)  04/26/2019 0132   PROT 6.3 (L) 04/24/2019 0154   ALBUMIN 3.1 (L) 04/24/2019 0154   AST 26 04/24/2019 0154   ALT 20 04/24/2019 0154   ALKPHOS 51 04/24/2019 0154   BILITOT 0.7 04/24/2019 0154   GFRNONAA 47 (L) 04/26/2019 0132   GFRAA 54 (L) 04/26/2019 0132   Lipase  No results found for: LIPASE     Studies/Results: No results found.  Anti-infectives: Anti-infectives (From admission, onward)   None       Assessment/Plan Fall Right acetabular fracture- Per Dr. Cindra Presume x 2 months.F/u 2 weeks.PT/OT Subdural hematoma- per Dr. Ronnald Ramp; no significant change on follow-up CT scan.To remain off coumadin for at least another week until another repeat CT scan of her head Right orbital wall fracture - per Dr. Redmond Baseman. No intervention required.  Right3rd phalanx fx-Per Dr. Stann Mainland - Non-op, buddy tape Hx of A fib on Coumadin- On hold given SDH. INR wnl Hx COPD- PRN nebs. Duoneb this AM.  Hx NICM - Home meds  FEN -Regular, d/c IVF VTE -SCDs, chemical prophylaxis on hold as above ID -None  Dispo:PT/OT. CIR working on Ship broker.     LOS: 4 days    Jillyn Ledger , [facility] 04/28/2019, 7:52 AM Pager: [phone]

## 2019-04-28 NOTE — Care Management Important Message (Signed)
Important Message  Patient Details  Name: [name] MRN: [id number] Date of Birth: 05-30-1934   Medicare Important Message Given:  Yes     Shelda Altes 04/28/2019, 8:22 AM

## 2019-04-28 NOTE — Progress Notes (Signed)
Occupational Therapy Treatment Patient Details Name: [name] MRN: [id number] DOB: 12-21-1933 Today's Date: 04/28/2019    History of present illness Patient transferred from [facility] after sustaining a fall over her dogs onto her R side.  Pt sustained a nondisplaced right orbital wall fracture, right acetabular fracture, very small subdural hematoma and right inferior rami fracture.  PMH: COPD, GERD, asthma.   OT comments  Pt presents seated in recliner pleasant and willing to participate in therapy session. Issued pt level 1 theraband and instructed pt in UB HEP for continued strengthening/endurance as precursor to completing ADL and functional transfers. Pt return demonstrating with min cues for technique and within her pain tolerance. She remains motivated to return to PLOF. Will continue per POC.   Follow Up Recommendations  CIR;Supervision/Assistance - 24 hour    Equipment Recommendations  3 in 1 bedside commode;Other (comment)(TBD in next venue)          Precautions / Restrictions Precautions Precautions: Fall Restrictions Weight Bearing Restrictions: Yes RLE Weight Bearing: Touchdown weight bearing       Mobility Bed Mobility Overal bed mobility: Needs Assistance Bed Mobility: Supine to Sit;Sit to Supine     Supine to sit: Min assist     General bed mobility comments: received OOB in recliner  Transfers Overall transfer level: Needs assistance Equipment used: Rolling walker (2 wheeled) Transfers: Sit to/from [facility] Sit to Stand: Mod assist Stand pivot transfers: Min assist       General transfer comment: cueing for safe hand placement, assist needed to power into standing from sitting EOB and for stability with pivotal movements to chair towards pt's L side    Balance Overall balance assessment: Needs assistance   Sitting balance-Leahy Scale: Good       Standing balance-Leahy Scale: Poor Standing balance comment:  reliant on UE support to maintain TDWB status                           ADL either performed or assessed with clinical judgement   ADL Overall ADL's : Needs assistance/impaired                                       General ADL Comments: focus of session on UB/LB strengthening, pt reports just recently got up to recliner and comfortable staying OOB through dinner                       Cognition Arousal/Alertness: Awake/alert Behavior During Therapy: WFL for tasks assessed/performed Overall Cognitive Status: Within Functional Limits for tasks assessed                                          Exercises Exercises: General Upper Extremity;General Lower Extremity General Exercises - Upper Extremity Shoulder Flexion: AROM;Both;10 reps;Theraband Theraband Level (Shoulder Flexion): Level 1 (Yellow) Shoulder Horizontal ABduction: AROM;Both;10 reps;Seated;Theraband Theraband Level (Shoulder Horizontal Abduction): Level 1 (Yellow) Shoulder Horizontal ADduction: AROM;Both;10 reps;Seated;Theraband Theraband Level (Shoulder Horizontal Adduction): Level 1 (Yellow) Elbow Flexion: AROM;Both;10 reps;Seated;Theraband Theraband Level (Elbow Flexion): Level 1 (Yellow) Elbow Extension: AROM;Both;10 reps;Seated;Theraband Theraband Level (Elbow Extension): Level 1 (Yellow) Digit Composite Flexion: AROM;Right;5 reps;Seated(within pt limits) Composite Extension: AROM;Right;5 reps;Seated(within pt limits) General Exercises - Lower Extremity Ankle Circles/Pumps:  AROM;Both;10 reps;Seated Quad Sets: AROM;Both;10 reps;Supine Long Arc Quad: AAROM;Right;10 reps;Seated;Left   Shoulder Instructions       General Comments      Pertinent Vitals/ Pain       Pain Assessment: Faces Pain Score: 6  Faces Pain Scale: Hurts little more Pain Location: right hip, R rib cage Pain Descriptors / Indicators: Sharp;Sore;Discomfort;Grimacing;Guarding Pain  Intervention(s): Monitored during session;Limited activity within patient's tolerance  Home Living                                          Prior Functioning/Environment              Frequency  Min 2X/week        Progress Toward Goals  OT Goals(current goals can now be found in the care plan section)  Progress towards OT goals: Progressing toward goals  Acute Rehab OT Goals Patient Stated Goal: regain independence OT Goal Formulation: With patient Time For Goal Achievement: 05/10/19 Potential to Achieve Goals: Good  Plan Discharge plan remains appropriate    Co-evaluation                 AM-PAC OT "6 Clicks" Daily Activity     Outcome Measure   Help from another person eating meals?: None Help from another person taking care of personal grooming?: A Little Help from another person toileting, which includes using toliet, bedpan, or urinal?: A Lot Help from another person bathing (including washing, rinsing, drying)?: A Lot Help from another person to put on and taking off regular upper body clothing?: A Little Help from another person to put on and taking off regular lower body clothing?: A Lot 6 Click Score: 16    End of Session    OT Visit Diagnosis: Unsteadiness on feet (R26.81);Muscle weakness (generalized) (M62.81);Pain Pain - Right/Left: Right Pain - part of body: Hip;Hand   Activity Tolerance Patient tolerated treatment well   Patient Left in chair;with call bell/phone within reach   Nurse Communication Mobility status        Time: 0233-4356 OT Time Calculation (min): 16 min  Charges: OT General Charges $OT Visit: 1 Visit OT Treatments $Therapeutic Exercise: 8-22 mins  Lou Cal, OT Supplemental Rehabilitation Services Pager [phone] Office [phone]    Raymondo Band 04/28/2019, 4:32 PM

## 2019-04-29 LAB — CBC
HCT: 31.7 % — ABNORMAL LOW (ref 36.0–46.0)
Hemoglobin: 10.1 g/dL — ABNORMAL LOW (ref 12.0–15.0)
MCH: 28.8 pg (ref 26.0–34.0)
MCHC: 31.9 g/dL (ref 30.0–36.0)
MCV: 90.3 fL (ref 80.0–100.0)
Platelets: 101 10*3/uL — ABNORMAL LOW (ref 150–400)
RBC: 3.51 MIL/uL — ABNORMAL LOW (ref 3.87–5.11)
RDW: 14.1 % (ref 11.5–15.5)
WBC: 5 10*3/uL (ref 4.0–10.5)
nRBC: 0 % (ref 0.0–0.2)

## 2019-04-29 LAB — PROTIME-INR
INR: 1.2 (ref 0.8–1.2)
Prothrombin Time: 14.9 s (ref 11.4–15.2)

## 2019-04-29 MED ORDER — IPRATROPIUM-ALBUTEROL 0.5-2.5 (3) MG/3ML IN SOLN: 3.0000 mL | Freq: Once | RESPIRATORY_TRACT | Status: DC

## 2019-04-29 NOTE — Progress Notes (Signed)
Patient ID: [name], female   DOB: [birth year], 85 y.o.   MRN: [id number] CT head reviewed. Very small amount of residual subdural blood along the tent on the right, no mass effect. At this point it has been almost a week off anticoagulants. The risk of hemorrhagic complications for the subdural blood is likely now less than the risk of further time off anticoagulant therapy for her. I would resume her oral therapy. We will sign off now, please call if we can be of further assistance

## 2019-04-29 NOTE — Progress Notes (Signed)
Inpatient Rehabilitation Admissions Coordinator  Insurance will not approve an inpt rehab admission. I met with pt at bedside and she is aware. SNF recommended. I will alert RN CM, Almyra Free. We will sign off at this time.  Danne Baxter, RN, MSN Rehab Admissions Coordinator [phone] 04/29/2019 8:07 AM

## 2019-04-29 NOTE — Progress Notes (Addendum)
Subjective: CC: Right hand and right hip pain Patient disappointed that insurance declined CIR. She reports she is sore "all over" today but notes mainly in the right hand and right hip after working with therapies. Slightly SOB but no chest pain or cough. Using her IS. Pain well controlled. No HA or visual changes. She is tolerating a diet without abdominal pain, n/v/d/c. She had a BM yesterday.    Objective: Vital signs in last 24 hours: Temp:  [97.5 F (36.4 C)-98.7 F (37.1 C)] 97.5 F (36.4 C) (08/21 0641) Pulse Rate:  [70-78] 70 (08/21 0641) Resp:  [18-20] 20 (08/21 0641) BP: (101-103)/(42-55) 102/42 (08/21 0641) SpO2:  [92 %-100 %] 99 % (08/21 0641) Last BM Date: 04/28/19  Intake/Output from previous day: 08/20 0701 - 08/21 0700 In: 680 [P.O.:680] Out: 300 [Urine:300] Intake/Output this shift: No intake/output data recorded.  PE: Gen: Alert, NAD, pleasant HEENT:Right eye ecchymosis and swelling(stable from yesterday). PERRL. EOMI without entrapment. Card:regular rate, irregular rhythm Pulm:Upper airway congestion with some expiratory wheezing. Some clearance after coughing. Normal rate and effort. No rales. Pulling 750 on IS Abd: Soft, NT,ND, +BS ONG:EXBMW hand with ecchymosis and edema.Buddy tape in placeNoLE edema. DP and radial pulses 2+. Moves all extremities. Psych: A&Ox3  Skin: no rashes noted, warm and dry.  Lab Results:  Recent Labs    04/29/19 0136  WBC 5.0  HGB 10.1*  HCT 31.7*  PLT 101*   BMET No results for input(s): NA, K, CL, CO2, GLUCOSE, BUN, CREATININE, CALCIUM in the last 72 hours. PT/INR Recent Labs    04/27/19 0455 04/29/19 0136  LABPROT 14.8 14.9  INR 1.2 1.2   CMP     Component Value Date/Time   NA 136 04/26/2019 0132   K 4.5 04/26/2019 0132   CL 106 04/26/2019 0132   CO2 24 04/26/2019 0132   GLUCOSE 151 (H) 04/26/2019 0132   BUN 12 04/26/2019 0132   CREATININE 1.10 (H) 04/26/2019 0132   CALCIUM  8.4 (L) 04/26/2019 0132   PROT 6.3 (L) 04/24/2019 0154   ALBUMIN 3.1 (L) 04/24/2019 0154   AST 26 04/24/2019 0154   ALT 20 04/24/2019 0154   ALKPHOS 51 04/24/2019 0154   BILITOT 0.7 04/24/2019 0154   GFRNONAA 47 (L) 04/26/2019 0132   GFRAA 54 (L) 04/26/2019 0132   Lipase  No results found for: LIPASE     Studies/Results: Ct Head Wo Contrast  Result Date: 04/28/2019 CLINICAL DATA:  Follow-up subdural hemorrhage. EXAM: CT HEAD WITHOUT CONTRAST TECHNIQUE: Contiguous axial images were obtained from the base of the skull through the vertex without intravenous contrast. COMPARISON:  04/24/2019 FINDINGS: Brain: Small volume subdural hemorrhage along the right tentorium and over the posterior right cerebral convexity has slightly changed in distribution but not in overall amount. There is no associated mass effect, and no new intracranial hemorrhage is identified. No acute infarct is evident. The ventricles are normal in size. Cerebral white matter hypodensities are unchanged and nonspecific but compatible with mild chronic small vessel ischemic disease. Vascular: Calcified atherosclerosis at the skull base. No hyperdense vessel. Skull: Right lateral orbital wall fracture as previously seen. Sinuses/Orbits: Bilateral cataract extraction. Mucosal thickening in the right greater than left maxillary and left sphenoid sinuses. Other: Decreased size of right periorbital hematoma. IMPRESSION: Unchanged small volume right-sided subdural hemorrhage. No mass effect. Electronically Signed   By: Logan Bores M.D.   On: 04/28/2019 13:27    Anti-infectives: Anti-infectives (From admission, onward)  None       Assessment/Plan Fall Right acetabular fracture- Per Dr. Cindra Presume x 2 months.F/u 2 weeks.PT/OT Subdural hematoma- per Dr. Ronnald Ramp; no significant change on follow-up CT scan x2 (8/16 & 8/20).Coumadin on hold per NS. Will follow up on their recs.  Right orbital wall fracture - Per Dr.  Redmond Baseman. No intervention required.  Right3rd phalanx fx-Per Dr. Stann Mainland - Non-op, buddy tape Hx of A fib on Coumadin- On hold given SDH. INR wnl Hx COPD- PRN nebs. Duoneb this AM.  Hx NICM - Home meds  FEN -Regular  VTE -SCDs, chemical prophylaxis on hold as above ID -None. WBC 5.0  Dispo: PT/OT. CIR declined by insurance. Working towards SNF   LOS: 5 days    Jillyn Ledger , [facility] Surgery 04/29/2019, 7:58 AM Pager: [phone]

## 2019-04-29 NOTE — NC FL2 (Addendum)
[place] LEVEL OF CARE SCREENING TOOL     IDENTIFICATION  Patient Name: [name] Birthdate: Jan 12, 1934 Sex: female Admission Date (Current Location): 04/23/2019  [facility] and [place] Number: [employer] and Address:  [facility] [place], [place] [address] [place], [place] [address]      Provider Number: [id number]  Attending Physician Name and Address:  Md, Trauma, MD  Relative Name and Phone Number:  Willow Ora; niece; [phone]    Current Level of Care: Hospital Recommended Level of Care: [place] Prior Approval Number:    Date Approved/Denied:   PASRR Number: [id number]  Discharge Plan: SNF    Current Diagnoses: Patient Active Problem List   Diagnosis Date Noted  . Fall 04/24/2019  . Pneumonia involving right lung 06/18/2018  . Hoarseness 07/22/2015  . Acute rhinitis 03/18/2015  . Sinusitis, acute maxillary 04/02/2014  . Encounter for therapeutic drug monitoring 10/05/2013  . Shingles 09/06/2013  . Postherpetic neuralgia 09/04/2013  . Arthritis, lumbar spine 04/20/2013  . Back pain 04/20/2013  . Mild anxiety 08/20/2012  . Allergic rhinitis due to pollen 11/30/2011  . Lung nodules 11/30/2011  . Renal insufficiency 01/16/2011  . Long term current use of anticoagulant 12/19/2010  . ANEMIA 10/09/2010  . Secondary cardiomyopathy ([place]) 06/21/2010  . Atrial fibrillation ([place]) 05/28/2010  . MITRAL REGURGITATION 05/25/2008  . BARRETT'S ESOPHAGUS, HX OF 05/25/2008  . Asthma with COPD (chronic obstructive pulmonary disease) ([place]) 11/12/2007  . Obstructive sleep apnea 11/12/2007  . MITRAL VALVE PROLAPSE 09/03/2007    Orientation RESPIRATION BLADDER Height & Weight     Time, Situation, Place, Self  O2(nasal canuls) Continent, External catheter Weight: 153 lb 7 oz (69.6 kg) Height:  5\' 6"  (167.6 cm)  BEHAVIORAL SYMPTOMS/MOOD NEUROLOGICAL BOWEL NUTRITION STATUS      Continent Diet(see discharge  summary)  AMBULATORY STATUS COMMUNICATION OF NEEDS Skin   Extensive Assist Verbally Skin abrasions, Bruising, Other (Comment)(bruising on right face with abrasions; generalized ecchymosis and abrasions)                       Personal Care Assistance Level of Assistance  Bathing, Feeding, Dressing Bathing Assistance: Maximum assistance Feeding assistance: Independent Dressing Assistance: Maximum assistance     Functional Limitations Info  Sight, Hearing, Speech Sight Info: Adequate Hearing Info: Adequate Speech Info: Adequate    SPECIAL CARE FACTORS FREQUENCY  PT (By licensed PT), OT (By licensed OT)     PT Frequency: 5x week OT Frequency: 5x week            Contractures Contractures Info: Not present    Additional Factors Info  Code Status, Allergies, Psychotropic Code Status Info: Full Code Allergies Info: Amoxicillin-pot Clavulanate, Clarithromycin, Clindamycin, Doxycycline, Penicillins Psychotropic Info: escitalopram (LEXAPRO) tablet 10 mg every morning PO at 10am.         Current Medications (04/29/2019):  This is the current hospital active medication list Current Facility-Administered Medications  Medication Dose Route Frequency Provider Last Rate Last Dose  . acetaminophen (TYLENOL) tablet 650 mg  650 mg Oral Q6H PRN Donnie Mesa, MD      . albuterol (PROVENTIL) (2.5 MG/3ML) 0.083% nebulizer solution 2.5 mg  2.5 mg Nebulization Q4H PRN Cornett, Marcello Moores, MD   2.5 mg at 04/25/19 2020  . atorvastatin (LIPITOR) tablet 10 mg  10 mg Oral Daily Cornett, Thomas, MD   10 mg at 04/29/19 1012  . bisacodyl (DULCOLAX) suppository 10 mg  10 mg  Rectal Daily PRN Jillyn Ledger, PA-C      . Chlorhexidine Gluconate Cloth 2 % PADS 6 each  6 each Topical Daily Cornett, Marcello Moores, MD   6 each at 04/29/19 1012  . digoxin (LANOXIN) tablet 0.125 mg  0.125 mg Oral Daily Cornett, Thomas, MD   0.125 mg at 04/29/19 1013  . diltiazem (CARDIZEM CD) 24 hr capsule 240 mg  240 mg Oral  Daily Cornett, Thomas, MD   240 mg at 04/29/19 1013  . docusate sodium (COLACE) capsule 100 mg  100 mg Oral BID Cornett, Marcello Moores, MD   100 mg at 04/29/19 1012  . escitalopram (LEXAPRO) tablet 10 mg  10 mg Oral q morning - 10a Cornett, Thomas, MD   10 mg at 04/29/19 1012  . furosemide (LASIX) tablet 40 mg  40 mg Oral Daily PRN Cornett, Thomas, MD      . hydrALAZINE (APRESOLINE) injection 10 mg  10 mg Intravenous Q2H PRN Cornett, Thomas, MD      . ipratropium-albuterol (DUONEB) 0.5-2.5 (3) MG/3ML nebulizer solution 3 mL  3 mL Nebulization Once Maczis, Barth Kirks, PA-C      . morphine 2 MG/ML injection 2-4 mg  2-4 mg Intravenous Q2H PRN Donnie Mesa, MD   4 mg at 04/24/19 2128  . ondansetron (ZOFRAN-ODT) disintegrating tablet 4 mg  4 mg Oral Q6H PRN Cornett, Thomas, MD       Or  . ondansetron (ZOFRAN) injection 4 mg  4 mg Intravenous Q6H PRN Erroll Luna, MD   4 mg at 04/27/19 1228  . oxyCODONE (Oxy IR/ROXICODONE) immediate release tablet 5-10 mg  5-10 mg Oral Q4H PRN Jillyn Ledger, PA-C   10 mg at 04/28/19 1300  . pantoprazole (PROTONIX) EC tablet 40 mg  40 mg Oral Daily Cornett, Thomas, MD   40 mg at 04/29/19 1012  . polyethylene glycol (MIRALAX / GLYCOLAX) packet 17 g  17 g Oral Daily Jillyn Ledger, PA-C   17 g at 04/29/19 1013  . potassium chloride SA (K-DUR) CR tablet 20 mEq  20 mEq Oral Daily Cornett, Thomas, MD   20 mEq at 04/29/19 1012  . traMADol (ULTRAM) tablet 50 mg  50 mg Oral Q6H PRN Donnie Mesa, MD   50 mg at 04/29/19 1709     Discharge Medications: Please see discharge summary for a list of discharge medications.  Relevant Imaging Results:  Relevant Lab Results:   Additional Information [id number] [place], [place]

## 2019-04-29 NOTE — TOC Progression Note (Signed)
Transition of Care [facility]) - Progression Note    Patient Details  Name: [name] MRN: [id number] Date of Birth: 12-21-1933  Transition of Care [facility]) CM/SW [place], [place] Phone Number: 04/29/2019, 11:19 AM  Clinical Narrative:    CSW aware that pt was denied for CIR. Met again with pt at bedside. Reintroduced self, role, reason for visit. Pt remembers CSW from other visit.  We discussed care assistance at home and pt really feels she would benefit from at least a short stay at [facility]. Presented pt with CMS ratings of SNFs in [place], pt preference is for [facility] and [facility]). Permission given to send referral in hub. Pt niece will be at bedside later today.    Expected Discharge Plan: Skilled Nursing Facility Barriers to Discharge: Ship broker, Continued Medical Work up  Expected Discharge Plan and Services Expected Discharge Plan: [place] In-house Referral: Clinical Social Work Discharge Planning Services: CM Consult, Follow-up appt scheduled Post Acute Care Choice: [place] arrangements for the past 2 months: Single Family Home   Social Determinants of Health (SDOH) Interventions    Readmission Risk Interventions No flowsheet data found.

## 2019-04-29 NOTE — Progress Notes (Signed)
Physical Therapy Treatment Patient Details Name: [name] MRN: [id number] DOB: [birth year] Today's Date: 04/29/2019    History of Present Illness Patient transferred from [facility] after sustaining a fall over her dogs onto her R side.  Pt sustained a nondisplaced right orbital wall fracture, right acetabular fracture, very small subdural hematoma and right inferior rami fracture.  PMH: COPD, GERD, asthma.    PT Comments    Pt performed gt training short bout.  She was initially able to maintain TDWB but as gt progressed she fatigued and unable to maintain.  She performed 6 ft with moderate assistance.  Pt continues to benefit from skilled rehab in a post acute setting to improve strength and function before returning home.  Pt has been denied CIR placement and will benefit from SNF placement at this time.  Plan for progression of functional mobility next session to patient tolerance.    Follow Up Recommendations  CIR     Equipment Recommendations  Wheelchair (measurements PT);Wheelchair cushion (measurements PT);Rolling walker with 5" wheels    Recommendations for Other Services Rehab consult     Precautions / Restrictions Precautions Precautions: Fall Required Braces or Orthoses: (buddy tape R digits) Restrictions Weight Bearing Restrictions: Yes RLE Weight Bearing: Touchdown weight bearing    Mobility  Bed Mobility Overal bed mobility: Needs Assistance Bed Mobility: Supine to Sit;Sit to Supine     Supine to sit: Min assist     General bed mobility comments: Assistance for LE advancement and trunk elevation.  Transfers Overall transfer level: Needs assistance Equipment used: Rolling walker (2 wheeled) Transfers: Sit to/from [facility] Sit to Stand: Mod assist Stand pivot transfers: Mod assist       General transfer comment: cueing for safe hand placement, assist needed to power into standing from sitting EOB and for stability with  pivotal movements to chair towards pt's L side  Ambulation/Gait Ambulation/Gait assistance: Mod assist;+2 safety/equipment Gait Distance (Feet): 6 Feet Assistive device: Rolling walker (2 wheeled) Gait Pattern/deviations: Step-to pattern     General Gait Details: Pt performed hop to pattern with good maintenance on first two steps then progressively unable to maintain weight bearing so further gt deferred. Required close chair follow.   Stairs             Wheelchair Mobility    Modified Rankin (Stroke Patients Only)       Balance Overall balance assessment: Needs assistance Sitting-balance support: Feet supported Sitting balance-Leahy Scale: Good       Standing balance-Leahy Scale: Poor                              Cognition Arousal/Alertness: Awake/alert Behavior During Therapy: WFL for tasks assessed/performed Overall Cognitive Status: Within Functional Limits for tasks assessed                                        Exercises General Exercises - Lower Extremity Ankle Circles/Pumps: AROM;Both;10 reps;Seated Quad Sets: AROM;Both;10 reps;Supine Long Arc Quad: AAROM;10 reps;Seated;Right    General Comments        Pertinent Vitals/Pain Pain Assessment: Faces Pain Score: 6  Faces Pain Scale: Hurts little more Pain Location: right hip, R rib cage Pain Descriptors / Indicators: Sharp;Sore;Discomfort;Grimacing;Guarding Pain Intervention(s): Monitored during session;Repositioned    Home Living  Prior Function            PT Goals (current goals can now be found in the care plan section) Acute Rehab PT Goals Patient Stated Goal: regain independence Potential to Achieve Goals: Good Progress towards PT goals: Progressing toward goals    Frequency    Min 4X/week      PT Plan Current plan remains appropriate    Co-evaluation              AM-PAC PT "6 Clicks" Mobility   Outcome  Measure  Help needed turning from your back to your side while in a flat bed without using bedrails?: A Little Help needed moving from lying on your back to sitting on the side of a flat bed without using bedrails?: A Little Help needed moving to and from a bed to a chair (including a wheelchair)?: A Little Help needed standing up from a chair using your arms (e.g., wheelchair or bedside chair)?: A Lot Help needed to walk in hospital room?: Total Help needed climbing 3-5 steps with a railing? : Total 6 Click Score: 13    End of Session Equipment Utilized During Treatment: Gait belt Activity Tolerance: Patient tolerated treatment well Patient left: in chair;with call bell/phone within reach;with chair alarm set Nurse Communication: Mobility status PT Visit Diagnosis: Unsteadiness on feet (R26.81);Difficulty in walking, not elsewhere classified (R26.2);Pain Pain - Right/Left: Right Pain - part of body: Hip     Time: 3754-3606 PT Time Calculation (min) (ACUTE ONLY): 31 min  Charges:  $Gait Training: 8-22 mins $Therapeutic Activity: 8-22 mins                     Governor Rooks, PTA Acute Rehabilitation Services Pager [phone] Office [phone]     [name] 04/29/2019, 4:35 PM

## 2019-04-30 LAB — NOVEL CORONAVIRUS, NAA (HOSP ORDER, SEND-OUT TO REF LAB; TAT 18-24 HRS): SARS-CoV-2, NAA: NOT DETECTED

## 2019-04-30 MED ORDER — WARFARIN SODIUM 5 MG PO TABS
5.0000 mg | ORAL_TABLET | Freq: Once | ORAL | Status: AC
  Administered 2019-04-30: 5 mg via ORAL
  Filled 2019-04-30: qty 1

## 2019-04-30 MED ORDER — WARFARIN - PHARMACIST DOSING INPATIENT
Freq: Every day | Status: DC
  Administered 2019-05-02: 17:00:00

## 2019-04-30 NOTE — Progress Notes (Signed)
[place] for Warfarin Indication: atrial fibrillation  Allergies  Allergen Reactions  . Amoxicillin-Pot Clavulanate Itching and Swelling  . Clarithromycin Itching and Swelling  . Clindamycin Itching and Swelling  . Doxycycline Itching and Swelling  . Penicillins Itching and Swelling    Did it involve swelling of the face/tongue/throat, SOB, or low BP? Yes Did it involve sudden or severe rash/hives, skin peeling, or any reaction on the inside of your mouth or nose? No Did you need to seek medical attention at a hospital or doctor's office? Yes When did it last happen?Over 10 years If all above answers are "NO", may proceed with cephalosporin use.    Patient Measurements: Height: 5\' 6"  (167.6 cm) Weight: 153 lb 7 oz (69.6 kg) IBW/kg (Calculated) : 59.3  Vital Signs: Temp: 98.2 F (36.8 C) (08/22 0426) Temp Source: Oral (08/22 0426) BP: 129/73 (08/22 0426) Pulse Rate: 100 (08/22 0914)  Labs: Recent Labs    04/29/19 0136  HGB 10.1*  HCT 31.7*  PLT 101*  LABPROT 14.9  INR 1.2   Estimated Creatinine Clearance: 36.9 mL/min (A) (by C-G formula based on SCr of 1.1 mg/dL (H)).  Medical History: Past Medical History:  Diagnosis Date  . Asthma   . Atrial fibrillation ([place])   . Chronic rhinitis   . COPD (chronic obstructive pulmonary disease) ([place])   . GERD (gastroesophageal reflux disease)   . Mitral regurgitation   . Mitral valve prolapse   . Nasal polyps   . Nonischemic cardiomyopathy (HCC)    LVEF 25-30% improved to 45-50%  . Obstructive sleep apnea   . Transudative pleural effusion    Medications:  Scheduled:  . atorvastatin  10 mg Oral Daily  . Chlorhexidine Gluconate Cloth  6 each Topical Daily  . digoxin  0.125 mg Oral Daily  . diltiazem  240 mg Oral Daily  . docusate sodium  100 mg Oral BID  . escitalopram  10 mg Oral q morning - 10a  . ipratropium-albuterol  3 mL Nebulization Once  . pantoprazole  40 mg Oral  Daily  . polyethylene glycol  17 g Oral Daily  . potassium chloride SA  20 mEq Oral Daily    Assessment:  53 yoF fell at home 8/15, R orbital fx, R hip & rami fx, small subdural hematoma PMH: Afib on Warf 5mg  T/T/Su, 7.5mg  other days, last dose 8/14  Goal of Therapy:  INR 2-3 Monitor platelets by anticoagulation protocol: Yes   Plan:  Warfarin 5mg  today at 1800 Daily Protime/INR Monitor CBC, s/s bleed  Minda Ditto  [phone] 04/30/2019,10:06 AM

## 2019-04-30 NOTE — Progress Notes (Signed)
   Subjective/Chief Complaint: No complaints  today   Objective: Vital signs in last 24 hours: Temp:  [98.2 F (36.8 C)-99 F (37.2 C)] 98.2 F (36.8 C) (08/22 0426) Pulse Rate:  [76-100] 100 (08/22 0914) Resp:  [16-18] 16 (08/22 0426) BP: (118-129)/(69-92) 129/73 (08/22 0426) SpO2:  [99 %-100 %] 99 % (08/22 0920) Last BM Date: 04/29/19  Intake/Output from previous day: 08/21 0701 - 08/22 0700 In: 50 [P.O.:50] Out: 1800 [Urine:1800] Intake/Output this shift: No intake/output data recorded.  Gen: Alert, NAD, pleasant HEENT:Right eye ecchymosis and swellingPERRL. EOMI  Card:regular rate, irregular rhythm Pulm:clear Abd: Soft, NT,ND, +BS QAS:UORVI hand with ecchymosis and edema.NoLE edema. DP and radial pulses 2+. Moves all extremities.   Lab Results:  Recent Labs    04/29/19 0136  WBC 5.0  HGB 10.1*  HCT 31.7*  PLT 101*   BMET No results for input(s): NA, K, CL, CO2, GLUCOSE, BUN, CREATININE, CALCIUM in the last 72 hours. PT/INR Recent Labs    04/29/19 0136  LABPROT 14.9  INR 1.2   ABG No results for input(s): PHART, HCO3 in the last 72 hours.  Invalid input(s): PCO2, PO2  Studies/Results: No results found.  Anti-infectives: Anti-infectives (From admission, onward)   None      Assessment/Plan: Fall Right acetabular fracture- Per Dr. Cindra Presume x 2 months.F/u 2 weeks.PT/OT Subdural hematoma- per Dr. Ronnald Ramp; no significant change on follow-up CT scan x2 (8/16 & 8/20).Coumadin can be restarted per nsurg Right orbital wall fracture - Per Dr. Redmond Baseman. No intervention required.  Right3rd phalanx fx-Per Dr. Stann Mainland - Non-op, buddy tape Hx of A fib on Coumadin- restart today Hx COPD- PRN nebs.  Hx NICM - Home meds FEN -Regular  VTE -SCDs,  Dispo: PT/OT. CIRdeclined by insurance. Working towards SNF  [employer] 04/30/2019

## 2019-05-01 LAB — PROTIME-INR
INR: 1.1 (ref 0.8–1.2)
Prothrombin Time: 13.8 s (ref 11.4–15.2)

## 2019-05-01 MED ORDER — WARFARIN SODIUM 5 MG PO TABS
5.0000 mg | ORAL_TABLET | Freq: Once | ORAL | Status: AC
  Administered 2019-05-01: 5 mg via ORAL
  Filled 2019-05-01: qty 1

## 2019-05-01 NOTE — Progress Notes (Signed)
[place] for Warfarin Indication: atrial fibrillation  Allergies  Allergen Reactions  . Amoxicillin-Pot Clavulanate Itching and Swelling  . Clarithromycin Itching and Swelling  . Clindamycin Itching and Swelling  . Doxycycline Itching and Swelling  . Penicillins Itching and Swelling    Did it involve swelling of the face/tongue/throat, SOB, or low BP? Yes Did it involve sudden or severe rash/hives, skin peeling, or any reaction on the inside of your mouth or nose? No Did you need to seek medical attention at a hospital or doctor's office? Yes When did it last happen?Over 10 years If all above answers are "NO", may proceed with cephalosporin use.    Patient Measurements: Height: 5\' 6"  (167.6 cm) Weight: 153 lb 7 oz (69.6 kg) IBW/kg (Calculated) : 59.3  Vital Signs: Temp: 98 F (36.7 C) (08/23 0413) Temp Source: Oral (08/23 0413) BP: 120/76 (08/23 0413) Pulse Rate: 57 (08/23 0413)  Labs: Recent Labs    04/29/19 0136 05/01/19 0404  HGB 10.1*  --   HCT 31.7*  --   PLT 101*  --   LABPROT 14.9 13.8  INR 1.2 1.1   Estimated Creatinine Clearance: 36.9 mL/min (A) (by C-G formula based on SCr of 1.1 mg/dL (H)).  Medical History: Past Medical History:  Diagnosis Date  . Asthma   . Atrial fibrillation ([place])   . Chronic rhinitis   . COPD (chronic obstructive pulmonary disease) ([place])   . GERD (gastroesophageal reflux disease)   . Mitral regurgitation   . Mitral valve prolapse   . Nasal polyps   . Nonischemic cardiomyopathy (HCC)    LVEF 25-30% improved to 45-50%  . Obstructive sleep apnea   . Transudative pleural effusion    Medications:  Scheduled:  . atorvastatin  10 mg Oral Daily  . Chlorhexidine Gluconate Cloth  6 each Topical Daily  . digoxin  0.125 mg Oral Daily  . diltiazem  240 mg Oral Daily  . docusate sodium  100 mg Oral BID  . escitalopram  10 mg Oral q morning - 10a  . ipratropium-albuterol  3 mL Nebulization  Once  . pantoprazole  40 mg Oral Daily  . polyethylene glycol  17 g Oral Daily  . potassium chloride SA  20 mEq Oral Daily  . Warfarin - Pharmacist Dosing Inpatient   Does not apply q1800    Assessment:  83 yoF fell at home 8/15, R orbital fx, R hip & rami fx, small subdural hematoma PMH: Afib on Warf 5mg  T/T/Su, 7.5mg  other days, last dose 8/14  Goal of Therapy:  INR 2-3 Monitor platelets by anticoagulation protocol: Yes   Today, 05/01/2019 INR 1.1 after 5 mg Warfarin   Plan:  Warfarin 5mg  today at 1800 Daily Protime/INR Monitor CBC, s/s bleed  Minda Ditto  [phone] 05/01/2019,10:36 AM

## 2019-05-01 NOTE — Progress Notes (Signed)
Orthopedic Tech Progress Note Patient Details:  [name] 02/18/1934 [id number]  Ortho Devices Type of Ortho Device: Buddy tape Ortho Device/Splint Location: right buddy tape Ortho Device/Splint Interventions: Application   Post Interventions Patient Tolerated: Well Instructions Provided: Care of device   [place] Pink 05/01/2019, 10:09 AM

## 2019-05-01 NOTE — Progress Notes (Signed)
       Subjective: CC: Soreness No new complaints. Still reports some soreness of her hip when working with therapies. Tolerating diet without abdominal pain, n/v/d/c. Passing flatus. Last BM 8/21. No CP, SOB, or cough. Buddy tape on right finger has fallen off. No visual changes or HA.   Objective: Vital signs in last 24 hours: Temp:  [98 F (36.7 C)-98.5 F (36.9 C)] 98 F (36.7 C) (08/23 0413) Pulse Rate:  [57-100] 57 (08/23 0413) Resp:  [18] 18 (08/23 0413) BP: (120-122)/(68-76) 120/76 (08/23 0413) SpO2:  [97 %-99 %] 97 % (08/23 0413) Last BM Date: 04/29/19  Intake/Output from previous day: 08/22 0701 - 08/23 0700 In: 470 [P.O.:470] Out: 1350 [Urine:1350] Intake/Output this shift: No intake/output data recorded.  PE: Gen: Alert, NAD, pleasant HEENT:Right eye ecchymosis and swelling PERRL. EOMI  Card:regular rate, irregular rhythm Pulm: CTA b/l, normal rate and effort  Abd: Soft, NT,ND, +BS GSU:PJSRP hand with ecchymosis and edema.NoLE edema. DP and radial pulses 2+. Moves all extremities.  Lab Results:  Recent Labs    04/29/19 0136  WBC 5.0  HGB 10.1*  HCT 31.7*  PLT 101*   BMET No results for input(s): NA, K, CL, CO2, GLUCOSE, BUN, CREATININE, CALCIUM in the last 72 hours. PT/INR Recent Labs    04/29/19 0136 05/01/19 0404  LABPROT 14.9 13.8  INR 1.2 1.1   CMP     Component Value Date/Time   NA 136 04/26/2019 0132   K 4.5 04/26/2019 0132   CL 106 04/26/2019 0132   CO2 24 04/26/2019 0132   GLUCOSE 151 (H) 04/26/2019 0132   BUN 12 04/26/2019 0132   CREATININE 1.10 (H) 04/26/2019 0132   CALCIUM 8.4 (L) 04/26/2019 0132   PROT 6.3 (L) 04/24/2019 0154   ALBUMIN 3.1 (L) 04/24/2019 0154   AST 26 04/24/2019 0154   ALT 20 04/24/2019 0154   ALKPHOS 51 04/24/2019 0154   BILITOT 0.7 04/24/2019 0154   GFRNONAA 47 (L) 04/26/2019 0132   GFRAA 54 (L) 04/26/2019 0132   Lipase  No results found for: LIPASE     Studies/Results: No results  found.  Anti-infectives: Anti-infectives (From admission, onward)   None       Assessment/Plan Fall Right acetabular fracture- Per Dr. Cindra Presume x 2 months.F/u 2 weeks.PT/OT Subdural hematoma- per Dr. Ronnald Ramp; no significant change on follow-up CT scanx2 (8/16 & 8/20).Coumadin can be restarted per nsurg Right orbital wall fracture -Per Dr. Redmond Baseman. No intervention required.  Right3rd phalanx fx-Per Dr. Stann Mainland - Non-op, buddy tape Hx of A fib on Coumadin- restarted 8/22 - per pharmacy  Hx COPD- PRN nebs. Hx NICM - Home meds FEN -Regular  VTE -SCDs, Coumadin (per pharmacy) Dispo: PT/OT. CIRdeclined by insurance. Working towards SNF Follow up: Dr. Stann Mainland, Dr. Ronnald Ramp, Dr. Redmond Baseman   LOS: 7 days    Jillyn Ledger , [facility] Surgery 05/01/2019, 9:05 AM Pager: [phone]

## 2019-05-02 LAB — PROTIME-INR
INR: 1.1 (ref 0.8–1.2)
Prothrombin Time: 14.3 s (ref 11.4–15.2)

## 2019-05-02 LAB — CBC
HCT: 32.2 % — ABNORMAL LOW (ref 36.0–46.0)
Hemoglobin: 10.5 g/dL — ABNORMAL LOW (ref 12.0–15.0)
MCH: 28.9 pg (ref 26.0–34.0)
MCHC: 32.6 g/dL (ref 30.0–36.0)
MCV: 88.7 fL (ref 80.0–100.0)
Platelets: 155 10*3/uL (ref 150–400)
RBC: 3.63 MIL/uL — ABNORMAL LOW (ref 3.87–5.11)
RDW: 14.2 % (ref 11.5–15.5)
WBC: 5.9 10*3/uL (ref 4.0–10.5)
nRBC: 0 % (ref 0.0–0.2)

## 2019-05-02 MED ORDER — BISACODYL 10 MG RE SUPP
10.0000 mg | Freq: Once | RECTAL | Status: AC
  Administered 2019-05-02: 10 mg via RECTAL
  Filled 2019-05-02: qty 1

## 2019-05-02 MED ORDER — WARFARIN SODIUM 7.5 MG PO TABS
7.5000 mg | ORAL_TABLET | Freq: Once | ORAL | Status: AC
  Administered 2019-05-02: 7.5 mg via ORAL
  Filled 2019-05-02: qty 1

## 2019-05-02 NOTE — Progress Notes (Signed)
       Subjective: CC: Soreness No new complaints. Patient reports she has some soreness in her hand and hip still but this is improving She is tolerating her diet without any abdominal pain, n/v/d/c. Passing flatus. Last BM 8/21. No visual changes or HA.   Objective: Vital signs in last 24 hours: Temp:  [97.8 F (36.6 C)-99.4 F (37.4 C)] 97.8 F (36.6 C) (08/24 0525) Pulse Rate:  [69-94] 94 (08/24 0525) Resp:  [18] 18 (08/24 0525) BP: (103-117)/(56-66) 117/63 (08/24 0525) SpO2:  [94 %-96 %] 95 % (08/24 0525) Last BM Date: 04/29/19  Intake/Output from previous day: 08/23 0701 - 08/24 0700 In: 240 [P.O.:240] Out: 300 [Urine:300] Intake/Output this shift: No intake/output data recorded.  PE: Gen: Alert, NAD, pleasant HEENT:Right eye ecchymosis and swelling. PERRL. EOMI  Card:regular rate, irregular rhythm Pulm: CTA b/l, normal rate and effort  Abd: Soft, NT,ND, +BS ATF:TDDUK hand with ecchymosis and edema.Buddy tape in place. NoLE edema. DP and radial pulses 2+. Moves all extremities.  Lab Results:  Recent Labs    05/02/19 0248  WBC 5.9  HGB 10.5*  HCT 32.2*  PLT 155   BMET No results for input(s): NA, K, CL, CO2, GLUCOSE, BUN, CREATININE, CALCIUM in the last 72 hours. PT/INR Recent Labs    05/01/19 0404 05/02/19 0248  LABPROT 13.8 14.3  INR 1.1 1.1   CMP     Component Value Date/Time   NA 136 04/26/2019 0132   K 4.5 04/26/2019 0132   CL 106 04/26/2019 0132   CO2 24 04/26/2019 0132   GLUCOSE 151 (H) 04/26/2019 0132   BUN 12 04/26/2019 0132   CREATININE 1.10 (H) 04/26/2019 0132   CALCIUM 8.4 (L) 04/26/2019 0132   PROT 6.3 (L) 04/24/2019 0154   ALBUMIN 3.1 (L) 04/24/2019 0154   AST 26 04/24/2019 0154   ALT 20 04/24/2019 0154   ALKPHOS 51 04/24/2019 0154   BILITOT 0.7 04/24/2019 0154   GFRNONAA 47 (L) 04/26/2019 0132   GFRAA 54 (L) 04/26/2019 0132   Lipase  No results found for: LIPASE     Studies/Results: No results found.   Anti-infectives: Anti-infectives (From admission, onward)   None       Assessment/Plan Fall Right acetabular fracture- Per Dr. Cindra Presume x 2 months.F/u 2 weeks.PT/OT Subdural hematoma- per Dr. Ronnald Ramp; no significant change on follow-up CT scanx2 (8/16 & 8/20).Coumadincan be restarted per nsurg Right orbital wall fracture -Per Dr. Redmond Baseman. No intervention required.  Right3rd phalanx fx-Per Dr. Stann Mainland - Non-op, buddy tape Hx of A fib on Coumadin-restarted 8/22 - per pharmacy. INR 1.1. Hgb stable.  Hx COPD- PRN nebs. Hx NICM - Home meds FEN -Regular, bowel regimen  VTE -SCDs, Coumadin (per pharmacy) Dispo: PT/OT. CIRdeclined by insurance. Working towards SNF. Suppository today.  Follow up: Dr. Stann Mainland, Dr. Ronnald Ramp, Dr. Redmond Baseman   LOS: 8 days    Jillyn Ledger , [facility] Surgery 05/02/2019, 7:42 AM Pager: [phone]

## 2019-05-02 NOTE — Progress Notes (Signed)
CSW called and left a voicemail for Emmet at [facility] to see if a bed was available. CSW is awaiting a return phone call.   Domenic Schwab, MSW, [place] Worker [facility]  [phone]

## 2019-05-02 NOTE — Care Management (Addendum)
Pt has accepted a bed at the [facility] in [place]; bed available, per Rhine in admissions.  Will begin insurance authorization today.  Will notify pt's niece, per her request.   Reinaldo Raddle, RN, BSN  Trauma/Neuro ICU Case Manager [phone] Addendum:  Per [place] in admissions, COVID test from 8/21 will NOT need to be repeated.  Reinaldo Raddle, RN, BSN  Trauma/Neuro ICU Case Manager [phone]

## 2019-05-02 NOTE — Discharge Summary (Signed)
[facility] Discharge Summary   Patient ID: [name] MRN: [id number] DOB/AGE: September 16, 1933 85 y.o.  Admit date: 04/23/2019 Discharge date: 05/03/2019  Admitting Diagnosis: Fall Right acetabular fracture Small subdural hematoma Right orbital wall fracture  Discharge Diagnosis Patient Active Problem List   Diagnosis Date Noted  . Fall 04/24/2019  . Pneumonia involving right lung 06/18/2018  . Hoarseness 07/22/2015  . Acute rhinitis 03/18/2015  . Sinusitis, acute maxillary 04/02/2014  . Encounter for therapeutic drug monitoring 10/05/2013  . Shingles 09/06/2013  . Postherpetic neuralgia 09/04/2013  . Arthritis, lumbar spine 04/20/2013  . Back pain 04/20/2013  . Mild anxiety 08/20/2012  . Allergic rhinitis due to pollen 11/30/2011  . Lung nodules 11/30/2011  . Renal insufficiency 01/16/2011  . Long term current use of anticoagulant 12/19/2010  . ANEMIA 10/09/2010  . Secondary cardiomyopathy ([place]) 06/21/2010  . Atrial fibrillation ([place]) 05/28/2010  . MITRAL REGURGITATION 05/25/2008  . BARRETT'S ESOPHAGUS, HX OF 05/25/2008  . Asthma with COPD (chronic obstructive pulmonary disease) ([place]) 11/12/2007  . Obstructive sleep apnea 11/12/2007  . MITRAL VALVE PROLAPSE 09/03/2007    Consultants ENT Orthopedics Neurosurgery  Imaging: No results found.  Procedures None  Hospital Course:  [name] is an 85yo female PMH atrial fibrillation on coumadin, who was transferred from [facility] after sustaining a fall.  She fell over her dog onto her right side.  She was worked up extensively at [facility] and was found to have a nondisplaced right orbital wall fracture, right acetabular fracture, very small subdural hematoma and right inferior rami fracture.  She received Kcentra prior to transfer.  She has remained hemodynamically stable.  Patient was admitted to the trauma service. Coumadin was held.  Neurosurgery was consulted for SDH and recommended  repeat CT scan. This remained stable and she required no neurosurgical intervention. Orthopedics was consulted for Right acetabular fracture and recommended nonoperative management with touchdown weightbearing x8 weeks. She later complained of right middle finger pain and was found to have a proximal phalanx fracture. Orthopedics recommended conservative management with buddy tape, WBAT. ENT was consulted for Right lateral orbital wall fracture and recommended no intervention. Coumadin was restarted on 8/22. Hemoglobin monitored and remained stable. At the time of discharge her INR was 1.2, continue working towards goal of INR 2-3. Patient worked with therapies during this admission who recommended SNF when medically stable for discharge. On 8/25 the patient was voiding well, tolerating diet, working well with therapies, pain well controlled, vital signs stable and felt stable for discharge to SNF.  Patient will follow up as below and knows to call with questions or concerns.     Physical Exam: Gen:  Alert, NAD, pleasant HEENT: EOM's intact, pupils equal and round. Healing ecchymosis around right orbit Card:  Irregular, 2+ DP pulses Pulm:  CTAB, no W/R/R, rate and effort normal Abd: Soft, NT/ND, +BS Ext:  Calves soft and nontender without edema. Right hand with ecchymosis and edema, right 2nd and 3rd fingers buddy taped together Neuro: moving all 4 extremities Psych: A&Ox3  Skin: no rashes noted, warm and dry   Allergies as of 05/03/2019      Reactions   Amoxicillin-pot Clavulanate Itching, Swelling   Clarithromycin Itching, Swelling   Clindamycin Itching, Swelling   Doxycycline Itching, Swelling   Penicillins Itching, Swelling   Did it involve swelling of the face/tongue/throat, SOB, or low BP? Yes Did it involve sudden or severe rash/hives, skin peeling, or any reaction on the inside of  your mouth or nose? No Did you need to seek medical attention at a hospital or doctor's office?  Yes When did it last happen?Over 10 years If all above answers are "NO", may proceed with cephalosporin use.      Medication List    TAKE these medications   acetaminophen 325 MG tablet Commonly known as: TYLENOL Take 2 tablets (650 mg total) by mouth every 6 (six) hours as needed for mild pain, fever or headache.   albuterol 108 (90 Base) MCG/ACT inhaler Commonly known as: VENTOLIN HFA Inhale 2 puffs into the lungs every 6 (six) hours as needed for wheezing or shortness of breath.   albuterol (2.5 MG/3ML) 0.083% nebulizer solution Commonly known as: PROVENTIL INHALE THE CONTENTS OF 1 VIAL USING NEBULIZER EVERY 4 HOURS AS NEEDED FOR WHEEZING OR SHORTNESS OF BREATH   atorvastatin 10 MG tablet Commonly known as: LIPITOR Take 10 mg by mouth daily.   bisacodyl 10 MG suppository Commonly known as: DULCOLAX Place 1 suppository (10 mg total) rectally daily as needed for moderate constipation.   Colace 100 MG capsule Generic drug: docusate sodium Take 100 mg by mouth 2 (two) times daily.   diazepam 5 MG tablet Commonly known as: VALIUM TAKE 1 TABLET BY MOUTH EVERY 12 HOURS AS NEEDED   digoxin 0.125 MG tablet Commonly known as: LANOXIN TAKE 1 TABLET EVERY DAY   diltiazem 240 MG 24 hr capsule Commonly known as: CARDIZEM CD TAKE 1 CAPSULE BY MOUTH EVERY DAY What changed: how much to take   escitalopram 10 MG tablet Commonly known as: LEXAPRO Take 10 mg by mouth every morning.   esomeprazole 40 MG capsule Commonly known as: NEXIUM Take 40 mg by mouth 2 (two) times daily.   furosemide 40 MG tablet Commonly known as: LASIX TAKE 1 TABLET EVERY DAY What changed:   when to take this  reasons to take this   phenylephrine-shark liver oil-mineral oil-petrolatum 0.25-3-14-71.9 % rectal ointment Commonly known as: PREPARATION H Place 1 application rectally 2 (two) times daily as needed for hemorrhoids.   polyethylene glycol 17 g packet Commonly known as: MIRALAX /  GLYCOLAX Take 17 g by mouth daily.   potassium chloride SA 20 MEQ tablet Commonly known as: Klor-Con M20 Take 1 tablet (20 mEq total) by mouth daily. What changed:   when to take this  reasons to take this   traMADol 50 MG tablet Commonly known as: ULTRAM Take 1 tablet (50 mg total) by mouth every 6 (six) hours as needed for moderate pain or severe pain. What changed:   when to take this  reasons to take this   warfarin 5 MG tablet Commonly known as: COUMADIN Take as directed. If you are unsure how to take this medication, talk to your nurse or doctor. Original instructions: Take 1 1/2 tablets daily except 1 tablet on Sundays, Tuesdays and Thursdays or as directed What changed:   how much to take  how to take this  when to take this  additional instructions        Follow-up Information    Nicholes Stairs, MD. Schedule an appointment as soon as possible for a visit in 1 week(s).   Specialty: Orthopedic Surgery Why: call to arrange follow up regarding acetabular fracture and finger fracture Contact information: [address] [place] [address] [phone]        Melida Quitter, MD. Call.   Specialty: Otolaryngology Why: call to arrange follow up regarding facial fracture Contact  information: [address] [place] [address] [phone]        Stanton Sweetwater. Call.   Why: as needed, you do not have to make an appointment Contact information: [place] [address] [phone]       Sharilyn Sites, MD. Schedule an appointment as soon as possible for a visit.   Specialty: Family Medicine Why: Call to make a post-hospitalization appointment with your primary care physician Contact information: [address] [place] [address] [phone]        Eustace Moore, MD. Call.   Specialty: Neurosurgery Why: as needed regarding head  injury Contact information: [address] [place] [address] [phone]           Signed: Wellington Hampshire, [facility] Surgery 05/03/2019, 8:46 AM Pager: [phone] Mon 7:00 am -11:30 AM Tues-Fri 7:00 am-4:30 pm Sat-Sun 7:00 am-11:30 am

## 2019-05-02 NOTE — Progress Notes (Signed)
[place] [facility] for Warfarin Indication: atrial fibrillation  Allergies  Allergen Reactions  . Amoxicillin-Pot Clavulanate Itching and Swelling  . Clarithromycin Itching and Swelling  . Clindamycin Itching and Swelling  . Doxycycline Itching and Swelling  . Penicillins Itching and Swelling    Did it involve swelling of the face/tongue/throat, SOB, or low BP? Yes Did it involve sudden or severe rash/hives, skin peeling, or any reaction on the inside of your mouth or nose? No Did you need to seek medical attention at a hospital or doctor's office? Yes When did it last happen?Over 10 years If all above answers are "NO", may proceed with cephalosporin use.    Patient Measurements: Height: 5\' 6"  (167.6 cm) Weight: 153 lb 7 oz (69.6 kg) IBW/kg (Calculated) : 59.3  Vital Signs: Temp: 97.8 F (36.6 C) (08/24 0525) BP: 117/63 (08/24 0525) Pulse Rate: 94 (08/24 0525)  Labs: Recent Labs    05/01/19 0404 05/02/19 0248  HGB  --  10.5*  HCT  --  32.2*  PLT  --  155  LABPROT 13.8 14.3  INR 1.1 1.1   Estimated Creatinine Clearance: 36.9 mL/min (A) (by C-G formula based on SCr of 1.1 mg/dL (H)).  Medical History: Past Medical History:  Diagnosis Date  . Asthma   . Atrial fibrillation ([place])   . Chronic rhinitis   . COPD (chronic obstructive pulmonary disease) ([place])   . GERD (gastroesophageal reflux disease)   . Mitral regurgitation   . Mitral valve prolapse   . Nasal polyps   . Nonischemic cardiomyopathy ([facility])    LVEF 25-30% improved to 45-50%  . Obstructive sleep apnea   . Transudative pleural effusion    Medications:  Scheduled:  . atorvastatin  10 mg Oral Daily  . bisacodyl  10 mg Rectal Once  . Chlorhexidine Gluconate Cloth  6 each Topical Daily  . digoxin  0.125 mg Oral Daily  . diltiazem  240 mg Oral Daily  . docusate sodium  100 mg Oral BID  . escitalopram  10 mg Oral q morning - 10a  . ipratropium-albuterol  3 mL  Nebulization Once  . pantoprazole  40 mg Oral Daily  . polyethylene glycol  17 g Oral Daily  . potassium chloride SA  20 mEq Oral Daily  . Warfarin - Pharmacist Dosing Inpatient   Does not apply q1800    Assessment:  64 yoF fell at home 8/15, R orbital fx, R hip & rami fx, small subdural hematoma PMH: Afib on Warf 5mg  T/T/Su, 7.5mg  other days, last dose 8/14  Goal of Therapy:  INR 2-3 Monitor platelets by anticoagulation protocol: Yes   Today, 05/02/2019 INR 1.1 after 5 mg Warfarin x 2, Increase warfarin to home dose (INR on admit 2.7, therapeutic). CBC stable. No bleeding noted.    Plan:  Warfarin 7.5 mg today at 1800 Daily Protime/INR Monitor CBC, s/s bleed   Cherry Tur[place] A. Levada Dy, PharmD, BCPS, FNKF Clinical Pharmacist  Please utilize Amion for appropriate phone number to reach the unit pharmacist ([place])   05/02/2019,8:39 AM

## 2019-05-02 NOTE — Progress Notes (Signed)
Physical Therapy Treatment Patient Details Name: [name] MRN: [id number] DOB: 08-Jun-1934 Today's Date: 05/02/2019    History of Present Illness Patient transferred from [facility] after sustaining a fall over her dogs onto her R side.  Pt sustained a nondisplaced right orbital wall fracture, right acetabular fracture, very small subdural hematoma and right inferior rami fracture.  PMH: COPD, GERD, asthma.    PT Comments    Pt performed supine and seated LEs to tolerance and showed PTA her UE exercises with her theraband.  Assisted patient to edge of bed, oob, and over to recliner.  She continues to benefit from skilled rehab in a post acute setting to improve strength, endurance and function before returning home.    Follow Up Recommendations  CIR     Equipment Recommendations  Wheelchair (measurements PT);Wheelchair cushion (measurements PT);Rolling walker with 5" wheels    Recommendations for Other Services Rehab consult     Precautions / Restrictions Precautions Precautions: Fall Required Braces or Orthoses: (buddy tape on R digits) Restrictions Weight Bearing Restrictions: Yes RLE Weight Bearing: Touchdown weight bearing    Mobility  Bed Mobility Overal bed mobility: Needs Assistance Bed Mobility: Supine to Sit     Supine to sit: Min assist     General bed mobility comments: Assistance for LE advancement and trunk elevation.  Transfers Overall transfer level: Needs assistance Equipment used: Rolling walker (2 wheeled) Transfers: Sit to/from [facility] Sit to Stand: Mod assist         General transfer comment: Cues for hand placement to and from seated surface while maintaining touch down weight bearing.  Ambulation/Gait Ambulation/Gait assistance: Mod assist;+2 safety/equipment Gait Distance (Feet): 4 Feet(steps from bed to recliner.) Assistive device: Rolling walker (2 wheeled) Gait Pattern/deviations: Step-to pattern     General Gait Details: Pt performed hop to pattern with improved ability to maintain weight bearing status.  She continues to fatigue quickly.   Stairs             Wheelchair Mobility    Modified Rankin (Stroke Patients Only)       Balance Overall balance assessment: Needs assistance Sitting-balance support: Feet unsupported Sitting balance-Leahy Scale: Good       Standing balance-Leahy Scale: Poor                              Cognition Arousal/Alertness: Awake/alert Behavior During Therapy: WFL for tasks assessed/performed Overall Cognitive Status: Within Functional Limits for tasks assessed                                        Exercises General Exercises - Lower Extremity Ankle Circles/Pumps: AROM;Both;20 reps;Supine Quad Sets: AROM;Both;10 reps;Supine Long Arc Quad: AAROM;10 reps;Seated;Both    General Comments        Pertinent Vitals/Pain Pain Assessment: 0-10 Pain Score: 6  Pain Location: right hip, R rib cage Pain Descriptors / Indicators: Sharp;Sore;Discomfort;Grimacing;Guarding Pain Intervention(s): Monitored during session;Repositioned    Home Living                      Prior Function            PT Goals (current goals can now be found in the care plan section) Acute Rehab PT Goals Patient Stated Goal: regain independence Potential to Achieve Goals: Good Progress towards PT  goals: Progressing toward goals    Frequency    Min 4X/week      PT Plan Current plan remains appropriate    Co-evaluation              AM-PAC PT "6 Clicks" Mobility   Outcome Measure  Help needed turning from your back to your side while in a flat bed without using bedrails?: A Little Help needed moving from lying on your back to sitting on the side of a flat bed without using bedrails?: A Little Help needed moving to and from a bed to a chair (including a wheelchair)?: A Little Help needed standing up from a  chair using your arms (e.g., wheelchair or bedside chair)?: A Lot Help needed to walk in hospital room?: Total Help needed climbing 3-5 steps with a railing? : Total 6 Click Score: 13    End of Session Equipment Utilized During Treatment: Gait belt Activity Tolerance: Patient tolerated treatment well Patient left: in chair;with call bell/phone within reach;with chair alarm set Nurse Communication: Mobility status PT Visit Diagnosis: Unsteadiness on feet (R26.81);Difficulty in walking, not elsewhere classified (R26.2);Pain Pain - Right/Left: Right Pain - part of body: Hip     Time: 0630-1601 PT Time Calculation (min) (ACUTE ONLY): 17 min  Charges:  $Therapeutic Activity: 8-22 mins                     Governor Rooks, PTA Acute Rehabilitation Services Pager [phone] Office [phone]     [name] 05/02/2019, 3:07 PM

## 2019-05-03 ENCOUNTER — Inpatient Hospital Stay
Admission: RE | Admit: 2019-05-03 | Discharge: 2019-05-21 | Disposition: A | Discharge status: Home / Self Care | Payer: Medicare PPO | Source: Ambulatory Visit | Attending: Internal Medicine | Admitting: Internal Medicine

## 2019-05-03 DIAGNOSIS — I340 Nonrheumatic mitral (valve) insufficiency: Secondary | ICD-10-CM | POA: Diagnosis not present

## 2019-05-03 DIAGNOSIS — R262 Difficulty in walking, not elsewhere classified: Secondary | ICD-10-CM | POA: Diagnosis not present

## 2019-05-03 DIAGNOSIS — S62602A Fracture of unspecified phalanx of right middle finger, initial encounter for closed fracture: Secondary | ICD-10-CM | POA: Diagnosis not present

## 2019-05-03 DIAGNOSIS — N183 Chronic kidney disease, stage 3 (moderate): Secondary | ICD-10-CM | POA: Diagnosis not present

## 2019-05-03 DIAGNOSIS — W19XXXA Unspecified fall, initial encounter: Secondary | ICD-10-CM | POA: Diagnosis not present

## 2019-05-03 DIAGNOSIS — G894 Chronic pain syndrome: Secondary | ICD-10-CM | POA: Diagnosis not present

## 2019-05-03 DIAGNOSIS — S065X9A Traumatic subdural hemorrhage with loss of consciousness of unspecified duration, initial encounter: Secondary | ICD-10-CM | POA: Diagnosis not present

## 2019-05-03 DIAGNOSIS — Z7901 Long term (current) use of anticoagulants: Secondary | ICD-10-CM | POA: Diagnosis not present

## 2019-05-03 DIAGNOSIS — S0280XD Fracture of other specified skull and facial bones, unspecified side, subsequent encounter for fracture with routine healing: Secondary | ICD-10-CM | POA: Diagnosis not present

## 2019-05-03 DIAGNOSIS — S32591A Other specified fracture of right pubis, initial encounter for closed fracture: Secondary | ICD-10-CM | POA: Diagnosis not present

## 2019-05-03 DIAGNOSIS — M6281 Muscle weakness (generalized): Secondary | ICD-10-CM | POA: Diagnosis not present

## 2019-05-03 DIAGNOSIS — S32591S Other specified fracture of right pubis, sequela: Secondary | ICD-10-CM | POA: Diagnosis not present

## 2019-05-03 DIAGNOSIS — S32491A Other specified fracture of right acetabulum, initial encounter for closed fracture: Secondary | ICD-10-CM | POA: Diagnosis not present

## 2019-05-03 DIAGNOSIS — I480 Paroxysmal atrial fibrillation: Secondary | ICD-10-CM | POA: Diagnosis not present

## 2019-05-03 DIAGNOSIS — R5381 Other malaise: Secondary | ICD-10-CM | POA: Diagnosis not present

## 2019-05-03 DIAGNOSIS — S0280XS Fracture of other specified skull and facial bones, unspecified side, sequela: Secondary | ICD-10-CM | POA: Diagnosis not present

## 2019-05-03 DIAGNOSIS — M2550 Pain in unspecified joint: Secondary | ICD-10-CM | POA: Diagnosis not present

## 2019-05-03 DIAGNOSIS — Z7401 Bed confinement status: Secondary | ICD-10-CM | POA: Diagnosis not present

## 2019-05-03 DIAGNOSIS — N9489 Other specified conditions associated with female genital organs and menstrual cycle: Secondary | ICD-10-CM | POA: Diagnosis not present

## 2019-05-03 DIAGNOSIS — S32401A Unspecified fracture of right acetabulum, initial encounter for closed fracture: Secondary | ICD-10-CM | POA: Diagnosis not present

## 2019-05-03 DIAGNOSIS — S62612D Displaced fracture of proximal phalanx of right middle finger, subsequent encounter for fracture with routine healing: Secondary | ICD-10-CM | POA: Diagnosis not present

## 2019-05-03 DIAGNOSIS — I429 Cardiomyopathy, unspecified: Secondary | ICD-10-CM | POA: Diagnosis not present

## 2019-05-03 DIAGNOSIS — S0285XD Fracture of orbit, unspecified, subsequent encounter for fracture with routine healing: Secondary | ICD-10-CM | POA: Diagnosis not present

## 2019-05-03 DIAGNOSIS — D649 Anemia, unspecified: Secondary | ICD-10-CM | POA: Diagnosis not present

## 2019-05-03 DIAGNOSIS — S32401S Unspecified fracture of right acetabulum, sequela: Secondary | ICD-10-CM | POA: Diagnosis not present

## 2019-05-03 DIAGNOSIS — E785 Hyperlipidemia, unspecified: Secondary | ICD-10-CM | POA: Diagnosis not present

## 2019-05-03 DIAGNOSIS — S066X0A Traumatic subarachnoid hemorrhage without loss of consciousness, initial encounter: Secondary | ICD-10-CM | POA: Diagnosis not present

## 2019-05-03 DIAGNOSIS — S0231XA Fracture of orbital floor, right side, initial encounter for closed fracture: Secondary | ICD-10-CM | POA: Diagnosis not present

## 2019-05-03 DIAGNOSIS — I4821 Permanent atrial fibrillation: Secondary | ICD-10-CM | POA: Diagnosis not present

## 2019-05-03 DIAGNOSIS — S32401D Unspecified fracture of right acetabulum, subsequent encounter for fracture with routine healing: Secondary | ICD-10-CM | POA: Diagnosis not present

## 2019-05-03 DIAGNOSIS — J449 Chronic obstructive pulmonary disease, unspecified: Secondary | ICD-10-CM | POA: Diagnosis not present

## 2019-05-03 DIAGNOSIS — S62642D Nondisplaced fracture of proximal phalanx of right middle finger, subsequent encounter for fracture with routine healing: Secondary | ICD-10-CM | POA: Diagnosis not present

## 2019-05-03 DIAGNOSIS — M25551 Pain in right hip: Secondary | ICD-10-CM | POA: Diagnosis not present

## 2019-05-03 DIAGNOSIS — F418 Other specified anxiety disorders: Secondary | ICD-10-CM | POA: Diagnosis not present

## 2019-05-03 DIAGNOSIS — S065X0D Traumatic subdural hemorrhage without loss of consciousness, subsequent encounter: Secondary | ICD-10-CM | POA: Diagnosis not present

## 2019-05-03 DIAGNOSIS — K219 Gastro-esophageal reflux disease without esophagitis: Secondary | ICD-10-CM | POA: Diagnosis not present

## 2019-05-03 DIAGNOSIS — K5909 Other constipation: Secondary | ICD-10-CM | POA: Diagnosis not present

## 2019-05-03 DIAGNOSIS — S62642S Nondisplaced fracture of proximal phalanx of right middle finger, sequela: Secondary | ICD-10-CM | POA: Diagnosis not present

## 2019-05-03 DIAGNOSIS — S32591D Other specified fracture of right pubis, subsequent encounter for fracture with routine healing: Secondary | ICD-10-CM | POA: Diagnosis not present

## 2019-05-03 LAB — CBC
HCT: 32.5 % — ABNORMAL LOW (ref 36.0–46.0)
Hemoglobin: 10.7 g/dL — ABNORMAL LOW (ref 12.0–15.0)
MCH: 29 pg (ref 26.0–34.0)
MCHC: 32.9 g/dL (ref 30.0–36.0)
MCV: 88.1 fL (ref 80.0–100.0)
Platelets: 171 10*3/uL (ref 150–400)
RBC: 3.69 MIL/uL — ABNORMAL LOW (ref 3.87–5.11)
RDW: 14.3 % (ref 11.5–15.5)
WBC: 6.3 10*3/uL (ref 4.0–10.5)
nRBC: 0 % (ref 0.0–0.2)

## 2019-05-03 LAB — PROTIME-INR
INR: 1.2 (ref 0.8–1.2)
Prothrombin Time: 15.1 s (ref 11.4–15.2)

## 2019-05-03 MED ORDER — POLYETHYLENE GLYCOL 3350 17 G PO PACK: 17.0000 g | PACK | Freq: Every day | ORAL | 0 refills | Status: AC

## 2019-05-03 MED ORDER — WARFARIN SODIUM 7.5 MG PO TABS
7.5000 mg | ORAL_TABLET | Freq: Once | ORAL | Status: DC
  Filled 2019-05-03: qty 1

## 2019-05-03 MED ORDER — ACETAMINOPHEN 325 MG PO TABS: 650.0000 mg | ORAL_TABLET | Freq: Four times a day (QID) | ORAL | Status: AC | PRN

## 2019-05-03 MED ORDER — TRAMADOL HCL 50 MG PO TABS: 50.0000 mg | ORAL_TABLET | Freq: Four times a day (QID) | ORAL | 0 refills | Status: DC | PRN

## 2019-05-03 MED ORDER — BISACODYL 10 MG RE SUPP: 10.0000 mg | Freq: Every day | RECTAL | 0 refills | Status: DC | PRN

## 2019-05-03 NOTE — Progress Notes (Signed)
  [facility] Progress Note     Subjective: CC-  Still having some right hand and hip pain, although this is well controlled. Denies abdominal pain, n/v. Tolerating diet. BM yesterday. INR 1.2  Objective: Vital signs in last 24 hours: Temp:  [98.4 F (36.9 C)-98.8 F (37.1 C)] 98.5 F (36.9 C) (08/25 0531) Pulse Rate:  [65-71] 68 (08/25 0531) Resp:  [17-18] 18 (08/25 0531) BP: (107-115)/(55-87) 115/87 (08/25 0531) SpO2:  [95 %-97 %] 96 % (08/25 0531) Last BM Date: 05/02/19  Intake/Output from previous day: 08/24 0701 - 08/25 0700 In: 240 [P.O.:240] Out: 425 [Urine:425] Intake/Output this shift: No intake/output data recorded.  PE: Gen:  Alert, NAD, pleasant HEENT: EOM's intact, pupils equal and round. Healing ecchymosis around right orbit Card:  Irregular, 2+ DP pulses Pulm:  CTAB, no W/R/R, rate and effort normal Abd: Soft, NT/ND, +BS Ext:  Calves soft and nontender without edema. Right hand with ecchymosis and edema, right 2nd and 3rd fingers buddy taped together Neuro: moving all 4 extremities Psych: A&Ox3  Skin: no rashes noted, warm and dry  Lab Results:  Recent Labs    05/02/19 0248 05/03/19 0104  WBC 5.9 6.3  HGB 10.5* 10.7*  HCT 32.2* 32.5*  PLT 155 171   BMET No results for input(s): NA, K, CL, CO2, GLUCOSE, BUN, CREATININE, CALCIUM in the last 72 hours. PT/INR Recent Labs    05/02/19 0248 05/03/19 0104  LABPROT 14.3 15.1  INR 1.1 1.2   CMP     Component Value Date/Time   NA 136 04/26/2019 0132   K 4.5 04/26/2019 0132   CL 106 04/26/2019 0132   CO2 24 04/26/2019 0132   GLUCOSE 151 (H) 04/26/2019 0132   BUN 12 04/26/2019 0132   CREATININE 1.10 (H) 04/26/2019 0132   CALCIUM 8.4 (L) 04/26/2019 0132   PROT 6.3 (L) 04/24/2019 0154   ALBUMIN 3.1 (L) 04/24/2019 0154   AST 26 04/24/2019 0154   ALT 20 04/24/2019 0154   ALKPHOS 51 04/24/2019 0154   BILITOT 0.7 04/24/2019 0154   GFRNONAA 47 (L) 04/26/2019 0132   GFRAA 54 (L)  04/26/2019 0132   Lipase  No results found for: LIPASE     Studies/Results: No results found.  Anti-infectives: Anti-infectives (From admission, onward)   None       Assessment/Plan Fall Right acetabular fracture- Per Dr. Cindra Presume x 2 months.F/u 2 weeks.PT/OT Subdural hematoma- per Dr. Ronnald Ramp; no significant change on follow-up CT scanx2 (8/16 & 8/20).Coumadincan be restarted per nsurg Right orbital wall fracture -Per Dr. Redmond Baseman. No intervention required.  Right3rd phalanx fx-Per Dr. Stann Mainland - Non-op, buddy tape Hx of A fib on Coumadin-restarted 8/22 - per pharmacy. INR 1.2. Hgb stable.  Hx COPD- PRN nebs. Hx NICM - Home meds ID - none FEN -Regular, bowel regimen  VTE -SCDs,Coumadin (per pharmacy) Follow up: Dr. Stann Mainland, Dr. Ronnald Ramp, Dr. Redmond Baseman  Dispo: Medically stable for discharge to SNF when bed available, hopefully today. Continue PT/OT.    LOS: 9 days    Wellington Hampshire , [facility] Surgery 05/03/2019, 7:49 AM Pager: [phone] Mon-Thurs 7:00 am-4:30 pm Fri 7:00 am -11:30 AM Sat-Sun 7:00 am-11:30 am

## 2019-05-03 NOTE — Progress Notes (Signed)
[name] for Warfarin Indication: atrial fibrillation  Allergies  Allergen Reactions  . Amoxicillin-Pot Clavulanate Itching and Swelling  . Clarithromycin Itching and Swelling  . Clindamycin Itching and Swelling  . Doxycycline Itching and Swelling  . Penicillins Itching and Swelling    Did it involve swelling of the face/tongue/throat, SOB, or low BP? Yes Did it involve sudden or severe rash/hives, skin peeling, or any reaction on the inside of your mouth or nose? No Did you need to seek medical attention at a hospital or doctor's office? Yes When did it last happen?Over 10 years If all above answers are "NO", may proceed with cephalosporin use.    Patient Measurements: Height: 5\' 6"  (167.6 cm) Weight: 153 lb 7 oz (69.6 kg) IBW/kg (Calculated) : 59.3  Vital Signs: Temp: 98.5 F (36.9 C) (08/25 0531) Temp Source: Oral (08/25 0531) BP: 115/87 (08/25 0531) Pulse Rate: 68 (08/25 0531)  Labs: Recent Labs    05/01/19 0404 05/02/19 0248 05/03/19 0104  HGB  --  10.5* 10.7*  HCT  --  32.2* 32.5*  PLT  --  155 171  LABPROT 13.8 14.3 15.1  INR 1.1 1.1 1.2   Estimated Creatinine Clearance: 36.9 mL/min (A) (by C-G formula based on SCr of 1.1 mg/dL (H)).  Medical History: Past Medical History:  Diagnosis Date  . Asthma   . Atrial fibrillation ([place])   . Chronic rhinitis   . COPD (chronic obstructive pulmonary disease) ([place])   . GERD (gastroesophageal reflux disease)   . Mitral regurgitation   . Mitral valve prolapse   . Nasal polyps   . Nonischemic cardiomyopathy ([facility])    LVEF 25-30% improved to 45-50%  . Obstructive sleep apnea   . Transudative pleural effusion    Medications:  Scheduled:  . atorvastatin  10 mg Oral Daily  . Chlorhexidine Gluconate Cloth  6 each Topical Daily  . digoxin  0.125 mg Oral Daily  . diltiazem  240 mg Oral Daily  . docusate sodium  100 mg Oral BID  . escitalopram  10 mg Oral q morning - 10a  .  ipratropium-albuterol  3 mL Nebulization Once  . pantoprazole  40 mg Oral Daily  . polyethylene glycol  17 g Oral Daily  . potassium chloride SA  20 mEq Oral Daily  . Warfarin - Pharmacist Dosing Inpatient   Does not apply q1800    Assessment:  79 yoF fell at home 8/15, R orbital fx, R hip & rami fx, small subdural hematoma PMH: Afib on Warf 5mg  T/T/Su, 7.5mg  other days, last dose 8/14  Goal of Therapy:  INR 2-3 Monitor platelets by anticoagulation protocol: Yes   Today, 05/03/2019 INR 1.3 after 5 mg Warfarin x 2 and 7.5mg  x 1. Give slightly higher dose of warfarin than home dose today. (INR on admit 2.7, therapeutic). CBC stable. No bleeding noted.    Plan:  Warfarin 7.5 mg today at 1800 Daily Protime/INR Monitor CBC, s/s bleed   Yutaka Holberg A. Levada Dy, PharmD, BCPS, FNKF Clinical Pharmacist [place] Please utilize Amion for appropriate phone number to reach the unit pharmacist ([place])   05/03/2019,8:15 AM

## 2019-05-03 NOTE — Progress Notes (Signed)
Occupational Therapy Treatment Patient Details Name: [name] MRN: [id number] DOB: August 06, 1934 Today's Date: 05/03/2019    History of present illness Patient transferred from [facility] after sustaining a fall over her dogs onto her R side.  Pt sustained a nondisplaced right orbital wall fracture, right acetabular fracture, very small subdural hematoma and right inferior rami fracture.  PMH: COPD, GERD, asthma.   OT comments  Pt making great progress towards OT goals. Pt complete functional mobility from recliner to toilet with RW and MIN A- Min guard for safety during ambulation. MIN A to maintain balance with WB status during pericare. Pt completed standing grooming at sink with supervision. Pt likely leaving today for DC to SNF. Will continue to follow for acute OT needs.   Follow Up Recommendations  CIR;Supervision/Assistance - 24 hour    Equipment Recommendations       Recommendations for Other Services      Precautions / Restrictions Precautions Precautions: Fall Restrictions Weight Bearing Restrictions: Yes RLE Weight Bearing: Touchdown weight bearing       Mobility Bed Mobility Overal bed mobility: Needs Assistance       Supine to sit: Min assist;HOB elevated     General bed mobility comments: pt up in recliner  Transfers Overall transfer level: Needs assistance Equipment used: Rolling walker (2 wheeled) Transfers: Sit to/from Stand Sit to Stand: Mod assist;Min assist Stand pivot transfers: Min assist       General transfer comment: MOD A from recliner, MIN A from raised surface of BSC over toilet    Balance Overall balance assessment: Needs assistance Sitting-balance support: Feet unsupported Sitting balance-Leahy Scale: Good Sitting balance - Comments: able to perform toilet hygiene seated in front only   Standing balance support: During functional activity Standing balance-Leahy Scale: Fair Standing balance comment: able to complete  hand washing at sink with no LOB                           ADL either performed or assessed with clinical judgement   ADL Overall ADL's : Needs assistance/impaired     Grooming: Set up;Standing;Wash/dry hands;Supervision/safety                   Toilet Transfer: Minimal assistance;RW;Ambulation;BSC;Regular Toilet(BSC over regular toilet)   Toileting- Clothing Manipulation and Hygiene: Sit to/from stand;Minimal assistance Toileting - Clothing Manipulation Details (indicate cue type and reason): MIN A to maintain balance during functional activity     Functional mobility during ADLs: Minimal assistance;Rolling walker General ADL Comments: Pt progressing well with ADLs; intermittent MIN A to maintain balance with TDWB status     Vision Baseline Vision/History: No visual deficits     Perception     Praxis      Cognition Arousal/Alertness: Awake/alert Behavior During Therapy: WFL for tasks assessed/performed Overall Cognitive Status: Within Functional Limits for tasks assessed                                 General Comments: Pt very pleasant and asking appropriate questions about DC        Exercises   Shoulder Instructions       General Comments     Pertinent Vitals/ Pain       Pain Assessment: No/denies pain  Home Living  Prior Functioning/Environment              Frequency  Min 2X/week        Progress Toward Goals  OT Goals(current goals can now be found in the care plan section)  Progress towards OT goals: Progressing toward goals  Acute Rehab OT Goals Time For Goal Achievement: 05/10/19 Potential to Achieve Goals: Good  Plan Discharge plan remains appropriate    Co-evaluation                 AM-PAC OT "6 Clicks" Daily Activity     Outcome Measure   Help from another person eating meals?: None Help from another person taking care of personal  grooming?: A Little Help from another person toileting, which includes using toliet, bedpan, or urinal?: A Little Help from another person bathing (including washing, rinsing, drying)?: A Little Help from another person to put on and taking off regular upper body clothing?: None Help from another person to put on and taking off regular lower body clothing?: A Little 6 Click Score: 20    End of Session Equipment Utilized During Treatment: Gait belt;Rolling walker  OT Visit Diagnosis: Unsteadiness on feet (R26.81);Muscle weakness (generalized) (M62.81);Pain   Activity Tolerance Patient tolerated treatment well   Patient Left in chair;with call bell/phone within reach;with chair alarm set   Nurse Communication Mobility status(up in chair; needs purewic placement)        Time: 5056-9794 OT Time Calculation (min): 17 min  Charges: OT General Charges $OT Visit: 1 Visit OT Treatments $Self Care/Home Management : 8-22 mins     Ihor Gully, COTA/L 05/03/2019, 12:50 PM  [phone]

## 2019-05-03 NOTE — Progress Notes (Signed)
Called report to Pharmacologist at [facility]. Patient to be picked up by [facility] at 1400. Will continue to monitor.

## 2019-05-03 NOTE — Progress Notes (Signed)
Physical Therapy Treatment Patient Details Name: [name] MRN: [id number] DOB: 1934/03/27 Today's Date: 05/03/2019    History of Present Illness Patient transferred from [facility] after sustaining a fall over her dogs onto her R side.  Pt sustained a nondisplaced right orbital wall fracture, right acetabular fracture, very small subdural hematoma and right inferior rami fracture.  PMH: COPD, GERD, asthma.    PT Comments    Patient progressing with mobility and able to demonstrate improved safety sit to stand with cues.  Continues to need cues/assist to maintain proper weight bearing R LE and for sequencing.  Appropriate for SNF level rehab as planned d/c today to [place] [facility].  PT will follow if not d/c.   Follow Up Recommendations  SNF     Equipment Recommendations  Wheelchair (measurements PT);Wheelchair cushion (measurements PT);Rolling walker with 5" wheels    Recommendations for Other Services       Precautions / Restrictions Precautions Precautions: Fall Restrictions Weight Bearing Restrictions: Yes RLE Weight Bearing: Touchdown weight bearing    Mobility  Bed Mobility Overal bed mobility: Needs Assistance       Supine to sit: Min assist;HOB elevated     General bed mobility comments: Assistance for LE advancement  Transfers   Equipment used: Rolling walker (2 wheeled) Transfers: Sit to/from [facility] Sit to Stand: Mod assist Stand pivot transfers: Min assist       General transfer comment: cues for hand placement and R LE positioning for preventing weight bearing with transition, lifting assist from EOB, better from 3:1 with armrests, pivotal steps to [facility] with RW cues for weight bearing  Ambulation/Gait Ambulation/Gait assistance: Min assist Gait Distance (Feet): 5 Feet(from 3:1 to recliner) Assistive device: Rolling walker (2 wheeled) Gait Pattern/deviations: Step-to pattern;Trunk flexed     General Gait  Details: difficulty with proper weight bearing despite cues so limited ambulation, sequencing with L leg first; student RN in room and educated on weight bearing and use of gait belt   Stairs             Wheelchair Mobility    Modified Rankin (Stroke Patients Only)       Balance Overall balance assessment: Needs assistance   Sitting balance-Leahy Scale: Good Sitting balance - Comments: able to perform toilet hygiene seated in front only   Standing balance support: Bilateral upper extremity supported Standing balance-Leahy Scale: Poor Standing balance comment: UE support during assist for posterior toilet hygiene in standing                            Cognition Arousal/Alertness: Awake/alert Behavior During Therapy: WFL for tasks assessed/performed Overall Cognitive Status: Within Functional Limits for tasks assessed                                        Exercises General Exercises - Lower Extremity Ankle Circles/Pumps: AROM;Both;Supine;10 reps Short Arc Quad: AROM;10 reps;Right;Supine Long Arc Quad: AROM;10 reps;Right;Seated Heel Slides: AROM;10 reps;Left;Supine Hip ABduction/ADduction: AAROM;10 reps;Right;Seated    General Comments General comments (skin integrity, edema, etc.): on RA throughout session with dyspnea 1-2/4      Pertinent Vitals/Pain Faces Pain Scale: Hurts little more Pain Location: R hip with certain movements Pain Descriptors / Indicators: Sore Pain Intervention(s): Monitored during session;Repositioned;Premedicated before session    Home Living  Prior Function            PT Goals (current goals can now be found in the care plan section) Progress towards PT goals: Progressing toward goals    Frequency    Min 3X/week      PT Plan Current plan remains appropriate;Frequency needs to be updated    Co-evaluation              AM-PAC PT "6 Clicks" Mobility   Outcome  Measure  Help needed turning from your back to your side while in a flat bed without using bedrails?: A Little Help needed moving from lying on your back to sitting on the side of a flat bed without using bedrails?: A Little Help needed moving to and from a bed to a chair (including a wheelchair)?: A Little Help needed standing up from a chair using your arms (e.g., wheelchair or bedside chair)?: A Lot Help needed to walk in hospital room?: A Lot Help needed climbing 3-5 steps with a railing? : Total 6 Click Score: 14    End of Session Equipment Utilized During Treatment: Gait belt Activity Tolerance: Patient tolerated treatment well Patient left: in chair;with call bell/phone within reach;with nursing/sitter in room   PT Visit Diagnosis: Unsteadiness on feet (R26.81);Difficulty in walking, not elsewhere classified (R26.2);Pain Pain - Right/Left: Right Pain - part of body: Hip     Time: 8159-4707 PT Time Calculation (min) (ACUTE ONLY): 26 min  Charges:  $Therapeutic Exercise: 8-22 mins $Therapeutic Activity: 8-22 mins                     [name], [place] [facility] [phone] 05/03/2019    Reginia Naas 05/03/2019, 10:57 AM

## 2019-05-03 NOTE — Progress Notes (Signed)
Patient discharged to [facility]. Patient report phoned to [facility] RN. Patient belongings and AVS summary transported with patient via [place].  Patient  transported off unit by [facility] at 1458.

## 2019-05-03 NOTE — Discharge Instructions (Signed)
Continue dosing warfarin and checking INR until therapeutic. Goal INR 2-3   Warfarin tablets What is this medicine? WARFARIN (WAR far in) is an anticoagulant. It is used to treat or prevent clots in the veins, arteries, lungs, or heart. This medicine may be used for other purposes; ask your health care provider or pharmacist if you have questions. COMMON BRAND NAME(S): Coumadin, Jantoven What should I tell my health care provider before I take this medicine? They need to know if you have any of these conditions:  alcoholism  anemia  bleeding disorders  cancer  diabetes  heart disease  high blood pressure  history of bleeding in the gastrointestinal tract  history of stroke or other brain injury or disease  kidney or liver disease  protein C deficiency  protein S deficiency  psychosis or dementia  recent injury, recent or planned surgery or procedure  an unusual or allergic reaction to warfarin, other medicines, foods, dyes, or preservatives  pregnant or trying to get pregnant  breast-feeding How should I use this medicine? Take this medicine by mouth with a glass of water. Follow the directions on the prescription label. You can take this medicine with or without food. Take your medicine at the same time each day. Do not take it more often than directed. Do not stop taking except on your doctor's advice. Stopping this medicine may increase your risk of a blood clot. Be sure to refill your prescription before you run out of medicine. If your doctor or healthcare professional calls to change your dose, write down the dose and any other instructions. Always read the dose and instructions back to him or her to make sure you understand them. Tell your doctor or healthcare professional what strength of tablets you have on hand. Ask how many tablets you should take to equal your new dose. Write the date on the new instructions and keep them near your medicine. If you are told  to stop taking your medicine until your next blood test, call your doctor or healthcare professional if you do not hear anything within 24 hours of the test to find out your new dose or when to restart your prior dose. A special MedGuide will be given to you by the pharmacist with each prescription and refill. Be sure to read this information carefully each time. Talk to your pediatrician regarding the use of this medicine in children. Special care may be needed. Overdosage: If you think you have taken too much of this medicine contact a poison control center or emergency room at once. NOTE: This medicine is only for you. Do not share this medicine with others. What if I miss a dose? It is important not to miss a dose. If you miss a dose, call your healthcare provider. Take the dose as soon as possible on the same day. If it is almost time for your next dose, take only that dose. Do not take double or extra doses to make up for a missed dose. What may interact with this medicine? Do not take this medicine with any of the following medications:  agents that prevent or dissolve blood clots  aspirin or other salicylates  danshen  dextrothyroxine  mifepristone  St. John's Wort  red yeast rice This medicine may also interact with the following medications:  acetaminophen  agents that lower cholesterol  alcohol  allopurinol  amiodarone  antibiotics or medicines for treating bacterial, fungal or viral infections  azathioprine  barbiturate medicines for inducing sleep  or treating seizures  certain medicines for diabetes  certain medicines for heart rhythm problems  certain medicines for hepatitis C virus infections like daclatasvir, dasabuvir; ombitasvir; paritaprevir; ritonavir, elbasvir; grazoprevir, ledipasvir; sofosbuvir, simeprevir, sofosbuvir, sofosbuvir; velpatasvir, sofosbuvir; velpatasvir; voxilaprevir  certain medicines for high blood pressure  chloral  hydrate  cisapride  conivaptan  disulfiram  female hormones, including contraceptive or birth control pills  general anesthetics  herbal or dietary products like garlic, ginkgo, ginseng, green tea, or kava kava  influenza virus vaccine  female hormones  medicines for mental depression or psychosis  medicines for some types of cancer  medicines for stomach problems  methylphenidate  NSAIDs, medicines for pain and inflammation, like ibuprofen or naproxen  propoxyphene  quinidine, quinine  raloxifene  seizure or epilepsy medicine like carbamazepine, phenytoin, and valproic acid  steroids like cortisone and prednisone  tamoxifen  thyroid medicine  tramadol  vitamin c, vitamin e, and vitamin K  zafirlukast  zileuton This list may not describe all possible interactions. Give your health care provider a list of all the medicines, herbs, non-prescription drugs, or dietary supplements you use. Also tell them if you smoke, drink alcohol, or use illegal drugs. Some items may interact with your medicine. What should I watch for while using this medicine? Visit your healthcare professional for regular checks on your progress. You will need to have a blood test called a PT/INR regularly. The PT/INR blood test is done to make sure you are getting the right dose of this medicine. It is important to not miss your appointment for the blood tests. When you first start taking this medicine, these tests are done often. Once the correct dose is determined and you take your medicine properly, these tests can be done less often. Wear a medical ID bracelet or chain, and carry a card that describes your disease and details of your medicine and dosage times. Do not start taking or stop taking any medicines or over-the-counter medicines except on the advice of your healthcare professional. You should discuss your diet with your healthcare professional. Do not make major changes in your diet.  Vitamin K can affect how well this medicine works. Many foods contain vitamin K. It is important to eat a consistent amount of foods with vitamin K. Other foods with vitamin K that you should eat in consistent amounts are asparagus, basil, black-eyed peas, broccoli, brussel sprouts, cabbage, green onions, green tea, parsley, green leafy vegetables like beet greens, collard greens, kale, spinach, turnip greens, or certain lettuces like green leaf or romaine. This medicine can cause birth defects or bleeding in an unborn child. Women of childbearing age should use effective birth control while taking this medicine. If a woman becomes pregnant while taking this medicine, she should discuss the potential risks and her options with her healthcare professional. Avoid sports and activities that might cause injury while you are using this medicine. Severe falls or injuries can cause unseen bleeding. Be careful when using sharp tools or knives. Consider using an Copy. Take special care brushing or flossing your teeth. Report any injuries, bruising, or red spots on the skin to your healthcare professional. If you have an illness that causes vomiting, diarrhea, or fever for more than a few days, contact your health care professional. Also, check with your healthcare professional if you are unable to eat for several days. These problems can change the effect of this medicine. Even after you stop taking this medicine, it takes several days before your  body recovers its normal ability to clot blood. Ask your healthcare professional how long you need to be careful. If you are going to have surgery or dental work, tell your health care professional that you have been taking this medicine. What side effects may I notice from receiving this medicine? Side effects that you should report to your doctor or health care professional as soon as possible:  allergic reactions like skin rash, itching or hives, swelling of  the face, lips, or tongue  heavy menstrual bleeding or vaginal bleeding  painful, blue or purple toes  painful skin ulcers that do not go away  signs and symptoms of bleeding such as bloody or black, tarry stools; red or dark-brown urine; spitting up blood or brown material that looks like coffee grounds; red spots on the skin; unusual bruising or bleeding from the eye, gums, or nose  signs and symptoms of a blood clot such as chest pain; shortness of breath; pain, swelling, or warmth in the leg  signs and symptoms of a stroke such as changes in vision; confusion; trouble speaking or understanding; severe headaches; sudden numbness or weakness of the face, arm or leg; trouble walking; dizziness; loss of coordination  stomach pain  unusually weak or tired Side effects that usually do not require medical attention (report to your doctor or health care professional if they continue or are bothersome):  diarrhea  hair loss This list may not describe all possible side effects. Call your doctor for medical advice about side effects. You may report side effects to FDA at 1-800-FDA-1088. Where should I keep my medicine? Keep out of the reach of children. Store at room temperature between 15 and 30 degrees C (59 and 86 degrees F). Protect from light. Throw away any unused medicine after the expiration date. Do not flush down the toilet. NOTE: This sheet is a summary. It may not cover all possible information. If you have questions about this medicine, talk to your doctor, pharmacist, or health care provider.  2020 Elsevier/Gold Standard (2017-06-10 13:06:45)

## 2019-05-03 NOTE — Progress Notes (Signed)
Responded to Spiritual Consult to provide support to [name] and the staff. Upon my arrival she was alert, sitting up in the bed, and happy to see me. We had a conversation and listened to her concerns. I provided prayer, emotional and spiritual support. Will follow up as needed.

## 2019-05-03 NOTE — Care Management (Signed)
Authorization received from insurance for admission to [facility] today.  Ref # [id number].    Bedside nurse will need to call report to [phone].    Reinaldo Raddle, RN, BSN  Trauma/Neuro ICU Case Manager [phone]

## 2019-05-04 ENCOUNTER — Encounter: Payer: Self-pay | Admitting: Adult Health

## 2019-05-04 ENCOUNTER — Encounter (HOSPITAL_COMMUNITY)
Admission: RE | Admit: 2019-05-04 | Discharge: 2019-05-04 | Disposition: A | Discharge status: Home / Self Care | Payer: Medicare PPO | Source: Ambulatory Visit | Attending: Internal Medicine | Admitting: Internal Medicine

## 2019-05-04 ENCOUNTER — Non-Acute Institutional Stay (SKILLED_NURSING_FACILITY): Payer: Medicare PPO | Admitting: Adult Health

## 2019-05-04 DIAGNOSIS — I429 Cardiomyopathy, unspecified: Secondary | ICD-10-CM

## 2019-05-04 DIAGNOSIS — I480 Paroxysmal atrial fibrillation: Secondary | ICD-10-CM | POA: Insufficient documentation

## 2019-05-04 DIAGNOSIS — S065XAA Traumatic subdural hemorrhage with loss of consciousness status unknown, initial encounter: Secondary | ICD-10-CM

## 2019-05-04 DIAGNOSIS — I4821 Permanent atrial fibrillation: Secondary | ICD-10-CM | POA: Diagnosis not present

## 2019-05-04 DIAGNOSIS — F418 Other specified anxiety disorders: Secondary | ICD-10-CM | POA: Diagnosis not present

## 2019-05-04 DIAGNOSIS — K5909 Other constipation: Secondary | ICD-10-CM

## 2019-05-04 DIAGNOSIS — S62642S Nondisplaced fracture of proximal phalanx of right middle finger, sequela: Secondary | ICD-10-CM

## 2019-05-04 DIAGNOSIS — S32401S Unspecified fracture of right acetabulum, sequela: Secondary | ICD-10-CM

## 2019-05-04 DIAGNOSIS — S065X9A Traumatic subdural hemorrhage with loss of consciousness of unspecified duration, initial encounter: Secondary | ICD-10-CM | POA: Diagnosis not present

## 2019-05-04 DIAGNOSIS — N183 Chronic kidney disease, stage 3 unspecified: Secondary | ICD-10-CM

## 2019-05-04 DIAGNOSIS — Z20828 Contact with and (suspected) exposure to other viral communicable diseases: Secondary | ICD-10-CM | POA: Insufficient documentation

## 2019-05-04 DIAGNOSIS — N9489 Other specified conditions associated with female genital organs and menstrual cycle: Secondary | ICD-10-CM

## 2019-05-04 DIAGNOSIS — S32591S Other specified fracture of right pubis, sequela: Secondary | ICD-10-CM

## 2019-05-04 DIAGNOSIS — K219 Gastro-esophageal reflux disease without esophagitis: Secondary | ICD-10-CM | POA: Diagnosis not present

## 2019-05-04 DIAGNOSIS — Z7901 Long term (current) use of anticoagulants: Secondary | ICD-10-CM

## 2019-05-04 DIAGNOSIS — J449 Chronic obstructive pulmonary disease, unspecified: Secondary | ICD-10-CM | POA: Diagnosis not present

## 2019-05-04 LAB — PROTIME-INR
INR: 1.5 — ABNORMAL HIGH (ref 0.8–1.2)
Prothrombin Time: 17.7 seconds — ABNORMAL HIGH (ref 11.4–15.2)

## 2019-05-04 NOTE — Progress Notes (Signed)
Location:    [facility] Room Number: 155/P Place of Service:  SNF (31)   CODE STATUS: Full Code  Allergies  Allergen Reactions  . Amoxicillin-Pot Clavulanate Itching and Swelling  . Clarithromycin Itching and Swelling  . Clindamycin Itching and Swelling  . Doxycycline Itching and Swelling  . Penicillins Itching and Swelling    Did it involve swelling of the face/tongue/throat, SOB, or low BP? Yes Did it involve sudden or severe rash/hives, skin peeling, or any reaction on the inside of your mouth or nose? No Did you need to seek medical attention at a hospital or doctor's office? Yes When did it last happen?Over 10 years If all above answers are "NO", may proceed with cephalosporin use.     Chief Complaint  Patient presents with  . Hospitalization Follow-up    HPI:  She is a 85 year old woman who has been hospitalized from 04-23-19 through 05-03-19. She tripped over a dog and had a mechanical fall. She suffered the following: right acetabular fracture; right orbital wall fracture; right inferior rami fracture; right middle finger fracture and a small 4 mm right lateral subdural hematoma. She is here for short term rehab with her goal to return back home. She does have mild right hip pain. She denies any changes in her appetite. She denies any insomnia; no anxiety or depressive thoughts. She will continue to be followed for her chronic illnesses including the following: afib; gerd; anxiety with depression.   Past Medical History:  Diagnosis Date  . Asthma   . Atrial fibrillation ([place])   . Chronic rhinitis   . COPD (chronic obstructive pulmonary disease) ([place])   . GERD (gastroesophageal reflux disease)   . Mitral regurgitation   . Mitral valve prolapse   . Nasal polyps   . Nonischemic cardiomyopathy ([facility])    LVEF 25-30% improved to 45-50%  . Obstructive sleep apnea   . Transudative pleural effusion     Past Surgical History:  Procedure  Laterality Date  . APPENDECTOMY  2000  . Bilateral cataract surgery    . LAPAROSCOPIC NISSEN FUNDOPLICATION  7342  . NOSE SURGERY      Social History   Socioeconomic History  . Marital status: Single    Spouse name: Not on file  . Number of children: Not on file  . Years of education: Not on file  . Highest education level: Not on file  Occupational History  . Not on file  Social Needs  . Financial resource strain: Not on file  . Food insecurity    Worry: Not on file    Inability: Not on file  . Transportation needs    Medical: Not on file    Non-medical: Not on file  Tobacco Use  . Smoking status: Former Smoker    Packs/day: 0.50    Years: 20.00    Pack years: 10.00    Types: Cigarettes    Quit date: 10/08/1980    Years since quitting: 38.5  . Smokeless tobacco: Never Used  Substance and Sexual Activity  . Alcohol use: No    Alcohol/week: 0.0 standard drinks  . Drug use: No  . Sexual activity: Not on file  Lifestyle  . Physical activity    Days per week: Not on file    Minutes per session: Not on file  . Stress: Not on file  Relationships  . Social Herbalist on phone: Not on file    Gets together:  Not on file    Attends religious service: Not on file    Active member of club or organization: Not on file    Attends meetings of clubs or organizations: Not on file    Relationship status: Not on file  . Intimate partner violence    Fear of current or ex partner: Not on file    Emotionally abused: Not on file    Physically abused: Not on file    Forced sexual activity: Not on file  Other Topics Concern  . Not on file  Social History Narrative  . Not on file   Family History  Problem Relation Age of Onset  . Cancer Mother        Colon  . Cancer Father        Colon  . Atrial fibrillation Sister   . Cancer Brother        Lung  . Cancer Brother        Lung      VITAL SIGNS BP 117/71   Pulse 73   Temp (!) 97.1 F (36.2 C) (Oral)    Resp 18   Ht 5\' 6"  (1.676 m)   Wt 156 lb 9.6 oz (71 kg)   BMI 25.28 kg/m   No facility-administered encounter medications on file as of 05/04/2019.    Outpatient Encounter Medications as of 05/04/2019  Medication Sig  . acetaminophen (TYLENOL) 325 MG tablet Take 2 tablets (650 mg total) by mouth every 6 (six) hours as needed for mild pain, fever or headache.  . albuterol (PROVENTIL HFA;VENTOLIN HFA) 108 (90 BASE) MCG/ACT inhaler Inhale 2 puffs into the lungs every 6 (six) hours as needed for wheezing or shortness of breath.  Marland Kitchen albuterol (PROVENTIL) (2.5 MG/3ML) 0.083% nebulizer solution INHALE THE CONTENTS OF 1 VIAL USING NEBULIZER EVERY 4 HOURS AS NEEDED FOR WHEEZING OR SHORTNESS OF BREATH  . atorvastatin (LIPITOR) 10 MG tablet Take 10 mg by mouth daily.    . bisacodyl (DULCOLAX) 10 MG suppository Place 1 suppository (10 mg total) rectally daily as needed for moderate constipation.  . diazepam (VALIUM) 5 MG tablet Take 5 mg by mouth daily as needed for anxiety.  . digoxin (LANOXIN) 0.125 MG tablet Take 0.125 mg by mouth daily.  Marland Kitchen diltiazem (CARDIZEM CD) 240 MG 24 hr capsule Take 240 mg by mouth daily.  Marland Kitchen docusate sodium (COLACE) 100 MG capsule Take 100 mg by mouth 2 (two) times daily.  Marland Kitchen escitalopram (LEXAPRO) 10 MG tablet Take 10 mg by mouth every morning.  Marland Kitchen esomeprazole (NEXIUM) 40 MG capsule Take 40 mg by mouth 2 (two) times daily.    . furosemide (LASIX) 40 MG tablet Take 40 mg by mouth daily.  . phenylephrine-shark liver oil-mineral oil-petrolatum (PREPARATION H) 0.25-3-14-71.9 % rectal ointment Place 1 application rectally 2 (two) times daily as needed for hemorrhoids.  . polyethylene glycol (MIRALAX / GLYCOLAX) 17 g packet Take 17 g by mouth daily.  . potassium chloride SA (K-DUR) 20 MEQ tablet Take 20 mEq by mouth daily.  . traMADol (ULTRAM) 50 MG tablet Take 1 tablet (50 mg total) by mouth every 6 (six) hours as needed for moderate pain or severe pain.  Marland Kitchen warfarin (COUMADIN) 5 MG  tablet Take 5 mg by mouth every evening.     SIGNIFICANT DIAGNOSTIC EXAMS  TODAY;   04-23-19: ct of abdomen and pelvis:  1. Comminuted fracture of the right acetabulum with a moderate sized surrounding pelvic hematoma. There is a nondisplaced fracture  of the right inferior pubic ramus. 2. Dilated loops of small bowel without evidence for a distinct transition point. Findings may represent ileus or partial small bowel obstruction. 3. Significant cardiomegaly. 4. Large amount of stool throughout the colon. 5. Mildly dilated common bile duct and pancreatic duct without evidence for discrete pancreatic mass. Follow-up with a nonemergent outpatient MRCP is recommended for further evaluation of this finding.  04-23-19: ct of right hip:  1. Acute comminuted fracture of the right acetabulum as detailed above. Nondisplaced fracture of the inferior pubic ramus on the right. 2. Moderate size right-sided pelvic hematoma.  04-23-19: ct of head cervical spine and maxillofacial:  1. 4 mm right lateral subdural hematoma and 4 mm of midline shift to the left. 2. Mildly comminuted and minimally displaced right lateral orbital wall fracture. 3. Right periorbital hematoma. 4. No skull fracture. 5. No cervical spine fracture or subluxation. 6. Multilevel cervical spine degenerative changes. 7. Mild diffuse cerebral and cerebellar atrophy and mild chronic small vessel white matter ischemic changes in both cerebral hemispheres. 8. Bilateral carotid artery atheromatous calcifications.   04-24-19: right hip and pelvic x-ray: Right acetabular and pubic rami fractures.   04-24-19: ct of head: Redistribution of small right-sided subdural hematoma, now layering along the right leaflet of the tentorium cerebelli.  04-24-19: pelvic x-ray: Unchanged alignment of comminuted right acetabular fracture.  04-25-19: right hand x-ray: Minimally displaced fracture involving proximal base of third proximal phalanx.   04-28-19: ct of head: Unchanged small volume right-sided subdural hemorrhage. No mass effect.   LABS REVIEWED TODAY;   04-23-19: wbc 12.0; hgb 12.6; hct 40.1; mcv 89.5; plt 122; glucose 141; bun 22; creat 1.22; k+ 4.0; na++ 139; ca 8.8; liver normal albumin 3.4; INR 2.7  04-26-19: wbc 6.2; hgb 11.4; hct 34.9; mcv 89.0; plt 81; glucose 151; bun 12; creat 1.10 ;k+ 4.5; na++ 136; ca 8.4 05-02-19: wbc 5.9; hgb 10.5; hct 32.2; mcv 88.7; plt 155 05-03-19: wbc 6.3; hgb 10.7; hct 32.5; mcv 88.1; plt 171 05-04-19; INR 1.5     Review of Systems  Constitutional: Negative for malaise/fatigue.  Respiratory: Negative for cough and shortness of breath.   Cardiovascular: Negative for chest pain, palpitations and leg swelling.  Gastrointestinal: Negative for abdominal pain, constipation and heartburn.  Musculoskeletal: Positive for joint pain. Negative for back pain and myalgias.       Mild right hip pain   Skin: Negative.   Neurological: Negative for dizziness.  Psychiatric/Behavioral: The patient is not nervous/anxious.     Physical Exam Constitutional:      General: She is not in acute distress.    Appearance: She is well-developed. She is not diaphoretic.  Eyes:     Comments: History of bilateral cataract removal   Neck:     Musculoskeletal: Neck supple.     Thyroid: No thyromegaly.  Cardiovascular:     Rate and Rhythm: Normal rate and regular rhythm.     Pulses: Normal pulses.     Heart sounds: Normal heart sounds.  Pulmonary:     Effort: Pulmonary effort is normal. No respiratory distress.     Breath sounds: Normal breath sounds.  Abdominal:     General: Bowel sounds are normal. There is no distension.     Palpations: Abdomen is soft.     Tenderness: There is no abdominal tenderness.  Musculoskeletal:     Right lower leg: No edema.     Left lower leg: No edema.     Comments: Status post  right acetabular fracture Right orbital wall fracture Right inferior rami fracture  Right middle  finger fracture Is able to move all extremities   Lymphadenopathy:     Cervical: No cervical adenopathy.  Skin:    General: Skin is warm and dry.  Neurological:     Mental Status: She is alert and oriented to person, place, and time.     Comments: 4 mm right lateral  subdural hematoma  Psychiatric:        Mood and Affect: Mood normal.     ASSESSMENT/ PLAN:  TODAY;   1. Permanent atrial fibrillation: heart rate is stable: will continue digoxin 0.125 mg daily; cardizem cd 240 mg daily for rate control is on long term coumadin 5 mg daily   2.  Long term current use of anticoagulant: for INR 1.5 will continue coumadin 5 mg daily and will check INR on 05-09-19; her coumadin was restarted on 04-30-19.   3.  Asthma with COPD (Chronic obstruction pulmonary disease) is stable will continue albuterol 2 puffs every 6 hours as needed or neb treatment every 4 hours as needed  4.  Secondary cardiomyopathy: is stable EF 55-60% (03-29-19): will continue lasix 40 mg daily   5. Depression with anxiety: is stable will continue lexapro 10 mg daily and has valium 5 mg daily as needed for 2 weeks. Will monitor  6. GERD without esophagitis: is stable will continue nexium 40 mg twice daily   7. Chronic constipation: is stable will continue miralax daily and colace twice daily   8. CKD (chronic kidney disease) stage 3: is without change bun 22 creat 1.22 will monitor   9. Subdural hematoma: is neurologically stable will monitor   10. Inferior pubic ramus fracture, right sequela/ closed nondisplaced fracture of right acetabulum unspecified part of acetabulum sequela/ closed nondisplaced fracture of proximal phalanx of right middle finger sequela: is stable will continue therapy as directed and will follow up with orthopedics. Will continue ultram 50 mg every 6 hours as needed for pain.   11. Pelvic hematoma: is stable will monitor her status; hgb 10.7  12. Dyslipidemia: is stable will continue lipitor 10  mg daily   Will check INR 05-09-19     MD is aware of resident's narcotic use and is in agreement with current plan of care. We will attempt to wean resident as apropriate   Ok Edwards NP [facility]  Contact [phone] Monday through Friday 8am- 5pm  After hours call [phone]

## 2019-05-05 ENCOUNTER — Other Ambulatory Visit: Payer: Self-pay | Admitting: Adult Health

## 2019-05-05 MED ORDER — DIAZEPAM 5 MG PO TABS: 5.0000 mg | ORAL_TABLET | Freq: Every day | ORAL | 0 refills | Status: AC | PRN

## 2019-05-06 DIAGNOSIS — S62642A Nondisplaced fracture of proximal phalanx of right middle finger, initial encounter for closed fracture: Secondary | ICD-10-CM | POA: Insufficient documentation

## 2019-05-06 DIAGNOSIS — S32591S Other specified fracture of right pubis, sequela: Secondary | ICD-10-CM | POA: Insufficient documentation

## 2019-05-06 DIAGNOSIS — K5909 Other constipation: Secondary | ICD-10-CM | POA: Insufficient documentation

## 2019-05-06 DIAGNOSIS — N183 Chronic kidney disease, stage 3 unspecified: Secondary | ICD-10-CM | POA: Insufficient documentation

## 2019-05-06 DIAGNOSIS — S065XAA Traumatic subdural hemorrhage with loss of consciousness status unknown, initial encounter: Secondary | ICD-10-CM | POA: Insufficient documentation

## 2019-05-06 DIAGNOSIS — F418 Other specified anxiety disorders: Secondary | ICD-10-CM | POA: Insufficient documentation

## 2019-05-06 DIAGNOSIS — S065X9A Traumatic subdural hemorrhage with loss of consciousness of unspecified duration, initial encounter: Secondary | ICD-10-CM | POA: Insufficient documentation

## 2019-05-06 DIAGNOSIS — K219 Gastro-esophageal reflux disease without esophagitis: Secondary | ICD-10-CM | POA: Insufficient documentation

## 2019-05-06 DIAGNOSIS — N9489 Other specified conditions associated with female genital organs and menstrual cycle: Secondary | ICD-10-CM | POA: Insufficient documentation

## 2019-05-06 DIAGNOSIS — S32401A Unspecified fracture of right acetabulum, initial encounter for closed fracture: Secondary | ICD-10-CM | POA: Insufficient documentation

## 2019-05-06 DIAGNOSIS — S0285XD Fracture of orbit, unspecified, subsequent encounter for fracture with routine healing: Secondary | ICD-10-CM | POA: Diagnosis not present

## 2019-05-09 ENCOUNTER — Encounter: Payer: Self-pay | Admitting: Adult Health

## 2019-05-09 ENCOUNTER — Other Ambulatory Visit: Payer: Self-pay | Admitting: Adult Health

## 2019-05-09 ENCOUNTER — Non-Acute Institutional Stay (SKILLED_NURSING_FACILITY): Payer: Medicare PPO | Admitting: Adult Health

## 2019-05-09 ENCOUNTER — Encounter (HOSPITAL_COMMUNITY)
Admission: RE | Admit: 2019-05-09 | Discharge: 2019-05-09 | Disposition: A | Discharge status: Home / Self Care | Payer: Medicare PPO | Source: Ambulatory Visit | Attending: *Deleted | Admitting: *Deleted

## 2019-05-09 DIAGNOSIS — S62642S Nondisplaced fracture of proximal phalanx of right middle finger, sequela: Secondary | ICD-10-CM

## 2019-05-09 DIAGNOSIS — S32591S Other specified fracture of right pubis, sequela: Secondary | ICD-10-CM | POA: Diagnosis not present

## 2019-05-09 DIAGNOSIS — S32401S Unspecified fracture of right acetabulum, sequela: Secondary | ICD-10-CM | POA: Diagnosis not present

## 2019-05-09 DIAGNOSIS — G894 Chronic pain syndrome: Secondary | ICD-10-CM | POA: Diagnosis not present

## 2019-05-09 LAB — SARS CORONAVIRUS 2 (TAT 6-24 HRS): SARS Coronavirus 2: NEGATIVE

## 2019-05-09 MED ORDER — TRAMADOL HCL 50 MG PO TABS: 50.0000 mg | ORAL_TABLET | Freq: Four times a day (QID) | ORAL | 0 refills | Status: DC | PRN

## 2019-05-09 NOTE — Progress Notes (Signed)
Location:    [facility] Room Number: 155/P Place of Service:  SNF (31)   CODE STATUS: Full Code  Allergies  Allergen Reactions  . Amoxicillin-Pot Clavulanate Itching and Swelling  . Clarithromycin Itching and Swelling  . Clindamycin Itching and Swelling  . Doxycycline Itching and Swelling  . Penicillins Itching and Swelling    Did it involve swelling of the face/tongue/throat, SOB, or low BP? Yes Did it involve sudden or severe rash/hives, skin peeling, or any reaction on the inside of your mouth or nose? No Did you need to seek medical attention at a hospital or doctor's office? Yes When did it last happen?Over 10 years If all above answers are "NO", may proceed with cephalosporin use.     Chief Complaint  Patient presents with  . Acute Visit    Pain Management    HPI:  She is presently taking ultram 50 mg every 6 hours as needed for pain. She has chronic arthritic pain in both shoulders and lower back. She has been taking ultram as needed for several years and usually uses this medication 1-2 times daily. This medication continues to effective for her. We have discuss the effects of long term narcotic use. She has verbalized understanding.   Past Medical History:  Diagnosis Date  . Asthma   . Atrial fibrillation ([place])   . Chronic rhinitis   . COPD (chronic obstructive pulmonary disease) ([place])   . GERD (gastroesophageal reflux disease)   . Mitral regurgitation   . Mitral valve prolapse   . Nasal polyps   . Nonischemic cardiomyopathy ([facility])    LVEF 25-30% improved to 45-50%  . Obstructive sleep apnea   . Transudative pleural effusion     Past Surgical History:  Procedure Laterality Date  . APPENDECTOMY  2000  . Bilateral cataract surgery    . LAPAROSCOPIC NISSEN FUNDOPLICATION  8127  . NOSE SURGERY      Social History   Socioeconomic History  . Marital status: Single    Spouse name: Not on file  . Number of children: Not on file   . Years of education: Not on file  . Highest education level: Not on file  Occupational History  . Not on file  Social Needs  . Financial resource strain: Not on file  . Food insecurity    Worry: Not on file    Inability: Not on file  . Transportation needs    Medical: Not on file    Non-medical: Not on file  Tobacco Use  . Smoking status: Former Smoker    Packs/day: 0.50    Years: 20.00    Pack years: 10.00    Types: Cigarettes    Quit date: 10/08/1980    Years since quitting: 38.6  . Smokeless tobacco: Never Used  Substance and Sexual Activity  . Alcohol use: No    Alcohol/week: 0.0 standard drinks  . Drug use: No  . Sexual activity: Not on file  Lifestyle  . Physical activity    Days per week: Not on file    Minutes per session: Not on file  . Stress: Not on file  Relationships  . Social Herbalist on phone: Not on file    Gets together: Not on file    Attends religious service: Not on file    Active member of club or organization: Not on file    Attends meetings of clubs or organizations: Not on file  Relationship status: Not on file  . Intimate partner violence    Fear of current or ex partner: Not on file    Emotionally abused: Not on file    Physically abused: Not on file    Forced sexual activity: Not on file  Other Topics Concern  . Not on file  Social History Narrative  . Not on file   Family History  Problem Relation Age of Onset  . Cancer Mother        Colon  . Cancer Father        Colon  . Atrial fibrillation Sister   . Cancer Brother        Lung  . Cancer Brother        Lung      VITAL SIGNS BP (!) 97/54   Pulse 70   Temp 97.6 F (36.4 C) (Oral)   Resp 20   Ht 5\' 6"  (1.676 m)   Wt 152 lb 9.6 oz (69.2 kg)   BMI 24.63 kg/m   No facility-administered encounter medications on file as of 05/09/2019.    Outpatient Encounter Medications as of 05/09/2019  Medication Sig  . acetaminophen (TYLENOL) 325 MG tablet Take 2  tablets (650 mg total) by mouth every 6 (six) hours as needed for mild pain, fever or headache.  . albuterol (PROVENTIL HFA;VENTOLIN HFA) 108 (90 BASE) MCG/ACT inhaler Inhale 2 puffs into the lungs every 6 (six) hours as needed for wheezing or shortness of breath.  Marland Kitchen albuterol (PROVENTIL) (2.5 MG/3ML) 0.083% nebulizer solution INHALE THE CONTENTS OF 1 VIAL USING NEBULIZER EVERY 4 HOURS AS NEEDED FOR WHEEZING OR SHORTNESS OF BREATH  . atorvastatin (LIPITOR) 10 MG tablet Take 10 mg by mouth daily.    . bisacodyl (DULCOLAX) 10 MG suppository Place 1 suppository (10 mg total) rectally daily as needed for moderate constipation.  . diazepam (VALIUM) 5 MG tablet Take 1 tablet (5 mg total) by mouth daily as needed for up to 4 days for anxiety.  . digoxin (LANOXIN) 0.125 MG tablet Take 0.125 mg by mouth daily.  Marland Kitchen diltiazem (CARDIZEM CD) 240 MG 24 hr capsule Take 240 mg by mouth daily.  Marland Kitchen docusate sodium (COLACE) 100 MG capsule Take 100 mg by mouth 2 (two) times daily.  Marland Kitchen escitalopram (LEXAPRO) 10 MG tablet Take 10 mg by mouth every morning.  Marland Kitchen esomeprazole (NEXIUM) 40 MG capsule Take 40 mg by mouth 2 (two) times daily.    . furosemide (LASIX) 40 MG tablet Take 40 mg by mouth daily.  . NON FORMULARY Diet- Regular  . phenylephrine-shark liver oil-mineral oil-petrolatum (PREPARATION H) 0.25-3-14-71.9 % rectal ointment Place 1 application rectally 2 (two) times daily as needed for hemorrhoids.  . polyethylene glycol (MIRALAX / GLYCOLAX) 17 g packet Take 17 g by mouth daily.  . potassium chloride SA (K-DUR) 20 MEQ tablet Take 20 mEq by mouth daily.  . traMADol (ULTRAM) 50 MG tablet Take 1 tablet (50 mg total) by mouth every 6 (six) hours as needed for moderate pain or severe pain.  Marland Kitchen  warfarin (COUMADIN) 5 MG tablet Take 5 mg by mouth every evening.     SIGNIFICANT DIAGNOSTIC EXAMS   PREVIOUS;   04-23-19: ct of abdomen and pelvis:  1. Comminuted fracture of the right acetabulum with a moderate sized  surrounding pelvic hematoma. There is a nondisplaced fracture of the right inferior pubic ramus. 2. Dilated loops of small bowel without evidence for a distinct transition point. Findings may represent ileus  or partial small bowel obstruction. 3. Significant cardiomegaly. 4. Large amount of stool throughout the colon. 5. Mildly dilated common bile duct and pancreatic duct without evidence for discrete pancreatic mass. Follow-up with a nonemergent outpatient MRCP is recommended for further evaluation of this finding.  04-23-19: ct of right hip:  1. Acute comminuted fracture of the right acetabulum as detailed above. Nondisplaced fracture of the inferior pubic ramus on the right. 2. Moderate size right-sided pelvic hematoma.  04-23-19: ct of head cervical spine and maxillofacial:  1. 4 mm right lateral subdural hematoma and 4 mm of midline shift to the left. 2. Mildly comminuted and minimally displaced right lateral orbital wall fracture. 3. Right periorbital hematoma. 4. No skull fracture. 5. No cervical spine fracture or subluxation. 6. Multilevel cervical spine degenerative changes. 7. Mild diffuse cerebral and cerebellar atrophy and mild chronic small vessel white matter ischemic changes in both cerebral hemispheres. 8. Bilateral carotid artery atheromatous calcifications.   04-24-19: right hip and pelvic x-ray: Right acetabular and pubic rami fractures.   04-24-19: ct of head: Redistribution of small right-sided subdural hematoma, now layering along the right leaflet of the tentorium cerebelli.  04-24-19: pelvic x-ray: Unchanged alignment of comminuted right acetabular fracture.  04-25-19: right hand x-ray: Minimally displaced fracture involving proximal base of third proximal phalanx.  04-28-19: ct of head: Unchanged small volume right-sided subdural hemorrhage. No mass effect.  NO NEW EXAMS.    LABS REVIEWED PREVIOUS;   04-23-19: wbc 12.0; hgb 12.6; hct 40.1; mcv 89.5; plt 122;  glucose 141; bun 22; creat 1.22; k+ 4.0; na++ 139; ca 8.8; liver normal albumin 3.4; INR 2.7  04-26-19: wbc 6.2; hgb 11.4; hct 34.9; mcv 89.0; plt 81; glucose 151; bun 12; creat 1.10 ;k+ 4.5; na++ 136; ca 8.4 05-02-19: wbc 5.9; hgb 10.5; hct 32.2; mcv 88.7; plt 155 05-03-19: wbc 6.3; hgb 10.7; hct 32.5; mcv 88.1; plt 171 05-04-19; INR 1.5   NO NEW LABS.   Review of Systems  Constitutional: Negative for malaise/fatigue.  Respiratory: Negative for cough and shortness of breath.   Cardiovascular: Negative for chest pain, palpitations and leg swelling.  Gastrointestinal: Negative for abdominal pain, constipation and heartburn.  Musculoskeletal: Positive for joint pain. Negative for back pain and myalgias.       Mild right hip pain Chronic bilateral shoulder and lower back pain   Skin: Negative.   Neurological: Negative for dizziness.  Psychiatric/Behavioral: The patient is not nervous/anxious.       Physical Exam Constitutional:      General: She is not in acute distress.    Appearance: She is well-developed. She is not diaphoretic.  Eyes:     Comments:  History of bilateral cataract removal    Neck:     Musculoskeletal: Neck supple.     Thyroid: No thyromegaly.  Cardiovascular:     Rate and Rhythm: Normal rate and regular rhythm.     Pulses: Normal pulses.     Heart sounds: Normal heart sounds.  Pulmonary:     Effort: Pulmonary effort is normal. No respiratory distress.     Breath sounds: Normal breath sounds.  Abdominal:     General: Bowel sounds are normal. There is no distension.     Palpations: Abdomen is soft.     Tenderness: There is no abdominal tenderness.  Musculoskeletal:     Right lower leg: No edema.     Left lower leg: No edema.     Comments: Status post right acetabular fracture Right  orbital wall fracture Right inferior rami fracture  Right middle finger fracture Is able to move all extremities    Lymphadenopathy:     Cervical: No cervical adenopathy.   Skin:    General: Skin is warm and dry.     Comments: Her bruising is resolving.   Neurological:     Mental Status: She is alert and oriented to person, place, and time.  Psychiatric:        Mood and Affect: Mood normal.    ASSESSMENT/ PLAN:  TODAY;   1. Inferior pubic ramus fracture, right sequela 2. Closed nondisplaced fracture of right acetabulum unspecified part of acetabulum sequela 3. Closed nondisplaced proximal phalanx of right middle finger sequela.  4. Chronic pain disorder  Will continue her ultram 50 mg every 6 hours as needed as a long term therapy for her. Will monitor her status.     MD is aware of resident's narcotic use and is in agreement with current plan of care. We will attempt to wean resident as appropriate.  Ok Edwards NP [facility]  Contact [phone] Monday through Friday 8am- 5pm  After hours call [phone]

## 2019-05-10 ENCOUNTER — Encounter: Payer: Self-pay | Admitting: Adult Health

## 2019-05-10 ENCOUNTER — Non-Acute Institutional Stay (SKILLED_NURSING_FACILITY): Payer: Medicare PPO | Admitting: Adult Health

## 2019-05-10 DIAGNOSIS — S065XAA Traumatic subdural hemorrhage with loss of consciousness status unknown, initial encounter: Secondary | ICD-10-CM

## 2019-05-10 DIAGNOSIS — N9489 Other specified conditions associated with female genital organs and menstrual cycle: Secondary | ICD-10-CM | POA: Diagnosis not present

## 2019-05-10 DIAGNOSIS — S32491A Other specified fracture of right acetabulum, initial encounter for closed fracture: Secondary | ICD-10-CM | POA: Diagnosis not present

## 2019-05-10 DIAGNOSIS — S32591S Other specified fracture of right pubis, sequela: Secondary | ICD-10-CM

## 2019-05-10 DIAGNOSIS — S62642S Nondisplaced fracture of proximal phalanx of right middle finger, sequela: Secondary | ICD-10-CM

## 2019-05-10 DIAGNOSIS — M25551 Pain in right hip: Secondary | ICD-10-CM | POA: Diagnosis not present

## 2019-05-10 DIAGNOSIS — S62602A Fracture of unspecified phalanx of right middle finger, initial encounter for closed fracture: Secondary | ICD-10-CM | POA: Diagnosis not present

## 2019-05-10 DIAGNOSIS — S065X9A Traumatic subdural hemorrhage with loss of consciousness of unspecified duration, initial encounter: Secondary | ICD-10-CM

## 2019-05-10 DIAGNOSIS — S32401S Unspecified fracture of right acetabulum, sequela: Secondary | ICD-10-CM

## 2019-05-10 NOTE — Progress Notes (Signed)
Location:    [facility] Room Number: 155/P Place of Service:  SNF (31)   CODE STATUS: Full Code  Allergies  Allergen Reactions  . Amoxicillin-Pot Clavulanate Itching and Swelling  . Clarithromycin Itching and Swelling  . Clindamycin Itching and Swelling  . Doxycycline Itching and Swelling  . Penicillins Itching and Swelling    Did it involve swelling of the face/tongue/throat, SOB, or low BP? Yes Did it involve sudden or severe rash/hives, skin peeling, or any reaction on the inside of your mouth or nose? No Did you need to seek medical attention at a hospital or doctor's office? Yes When did it last happen?Over 10 years If all above answers are "NO", may proceed with cephalosporin use.     Chief Complaint  Patient presents with  . Medical Management of Chronic Issues        Inferior pubic ramus fracture right sequela/closed nondisplaced fracture of right acetabulum unspecified part or acetabulum sequela/ closed nondisplaced fracture of proximal phalanx of right middle finger: Subdural hematoma: Pelvic hematoma:   Weekly follow up for the first 30 days post hospitalization.     HPI:  She is a 85 year old short term rehab patient being seen for the management of her chronic illnesses: her fractures; subdural hematoma; pelvic hematoma. Her pain is managed; she is participating in therapy. She denies any changes in her appetite; no insomnia.   Past Medical History:  Diagnosis Date  . Asthma   . Atrial fibrillation ([place])   . Chronic rhinitis   . COPD (chronic obstructive pulmonary disease) ([place])   . GERD (gastroesophageal reflux disease)   . Mitral regurgitation   . Mitral valve prolapse   . Nasal polyps   . Nonischemic cardiomyopathy ([facility])    LVEF 25-30% improved to 45-50%  . Obstructive sleep apnea   . Transudative pleural effusion     Past Surgical History:  Procedure Laterality Date  . APPENDECTOMY  2000  . Bilateral cataract surgery     . LAPAROSCOPIC NISSEN FUNDOPLICATION  7673  . NOSE SURGERY      Social History   Socioeconomic History  . Marital status: Single    Spouse name: Not on file  . Number of children: Not on file  . Years of education: Not on file  . Highest education level: Not on file  Occupational History  . Not on file  Social Needs  . Financial resource strain: Not on file  . Food insecurity    Worry: Not on file    Inability: Not on file  . Transportation needs    Medical: Not on file    Non-medical: Not on file  Tobacco Use  . Smoking status: Former Smoker    Packs/day: 0.50    Years: 20.00    Pack years: 10.00    Types: Cigarettes    Quit date: 10/08/1980    Years since quitting: 38.6  . Smokeless tobacco: Never Used  Substance and Sexual Activity  . Alcohol use: No    Alcohol/week: 0.0 standard drinks  . Drug use: No  . Sexual activity: Not on file  Lifestyle  . Physical activity    Days per week: Not on file    Minutes per session: Not on file  . Stress: Not on file  Relationships  . Social Herbalist on phone: Not on file    Gets together: Not on file    Attends religious service: Not on file  Active member of club or organization: Not on file    Attends meetings of clubs or organizations: Not on file    Relationship status: Not on file  . Intimate partner violence    Fear of current or ex partner: Not on file    Emotionally abused: Not on file    Physically abused: Not on file    Forced sexual activity: Not on file  Other Topics Concern  . Not on file  Social History Narrative   Former smoker, quit in 1982.  Nondrinker.  Mother and father both had colon cancer.    Family History  Problem Relation Age of Onset  . Cancer Mother        Colon  . Cancer Father        Colon  . Atrial fibrillation Sister   . Cancer Brother        Lung  . Cancer Brother        Lung      VITAL SIGNS BP 102/62   Pulse 65   Temp 98.4 F (36.9 C) (Oral)   Resp 18    Ht 5\' 6"  (1.676 m)   Wt 152 lb 9.6 oz (69.2 kg)   BMI 24.63 kg/m   No facility-administered encounter medications on file as of 05/10/2019.    Outpatient Encounter Medications as of 05/10/2019  Medication Sig  . acetaminophen (TYLENOL) 325 MG tablet Take 2 tablets (650 mg total) by mouth every 6 (six) hours as needed for mild pain, fever or headache.  . albuterol (PROVENTIL HFA;VENTOLIN HFA) 108 (90 BASE) MCG/ACT inhaler Inhale 2 puffs into the lungs every 6 (six) hours as needed for wheezing or shortness of breath.  Marland Kitchen albuterol (PROVENTIL) (2.5 MG/3ML) 0.083% nebulizer solution INHALE THE CONTENTS OF 1 VIAL USING NEBULIZER EVERY 4 HOURS AS NEEDED FOR WHEEZING OR SHORTNESS OF BREATH  . atorvastatin (LIPITOR) 10 MG tablet Take 10 mg by mouth daily.    . bisacodyl (DULCOLAX) 10 MG suppository Place 1 suppository (10 mg total) rectally daily as needed for moderate constipation.  . diazepam (VALIUM) 5 MG tablet Take 5 mg by mouth daily as needed for anxiety.  . digoxin (LANOXIN) 0.125 MG tablet Take 0.125 mg by mouth daily. Hold if apical pulse is less than 60  . diltiazem (CARDIZEM CD) 240 MG 24 hr capsule Take 240 mg by mouth daily.  Marland Kitchen docusate sodium (COLACE) 100 MG capsule Take 100 mg by mouth 2 (two) times daily.  Marland Kitchen escitalopram (LEXAPRO) 10 MG tablet Take 10 mg by mouth every morning.  Marland Kitchen esomeprazole (NEXIUM) 40 MG capsule Take 40 mg by mouth 2 (two) times daily.    . furosemide (LASIX) 40 MG tablet Take 40 mg by mouth daily.  . NON FORMULARY Diet- Regular  . phenylephrine-shark liver oil-mineral oil-petrolatum (PREPARATION H) 0.25-3-14-71.9 % rectal ointment Place 1 application rectally 2 (two) times daily as needed for hemorrhoids.  . polyethylene glycol (MIRALAX / GLYCOLAX) 17 g packet Take 17 g by mouth daily.  . potassium chloride SA (K-DUR) 20 MEQ tablet Take 20 mEq by mouth daily.  Marland Kitchen warfarin (COUMADIN) 5 MG tablet Take 5 mg by mouth every evening.  .  traMADol (ULTRAM) 50 MG tablet  Take 1 tablet (50 mg total) by mouth every 6 (six) hours as needed for moderate pain or severe pain.     SIGNIFICANT DIAGNOSTIC EXAMS  PREVIOUS;   04-23-19: ct of abdomen and pelvis:  1. Comminuted fracture of the right acetabulum  with a moderate sized surrounding pelvic hematoma. There is a nondisplaced fracture of the right inferior pubic ramus. 2. Dilated loops of small bowel without evidence for a distinct transition point. Findings may represent ileus or partial small bowel obstruction. 3. Significant cardiomegaly. 4. Large amount of stool throughout the colon. 5. Mildly dilated common bile duct and pancreatic duct without evidence for discrete pancreatic mass. Follow-up with a nonemergent outpatient MRCP is recommended for further evaluation of this finding.  04-23-19: ct of right hip:  1. Acute comminuted fracture of the right acetabulum as detailed above. Nondisplaced fracture of the inferior pubic ramus on the right. 2. Moderate size right-sided pelvic hematoma.  04-23-19: ct of head cervical spine and maxillofacial:  1. 4 mm right lateral subdural hematoma and 4 mm of midline shift to the left. 2. Mildly comminuted and minimally displaced right lateral orbital wall fracture. 3. Right periorbital hematoma. 4. No skull fracture. 5. No cervical spine fracture or subluxation. 6. Multilevel cervical spine degenerative changes. 7. Mild diffuse cerebral and cerebellar atrophy and mild chronic small vessel white matter ischemic changes in both cerebral hemispheres. 8. Bilateral carotid artery atheromatous calcifications.   04-24-19: right hip and pelvic x-ray: Right acetabular and pubic rami fractures.   04-24-19: ct of head: Redistribution of small right-sided subdural hematoma, now layering along the right leaflet of the tentorium cerebelli.  04-24-19: pelvic x-ray: Unchanged alignment of comminuted right acetabular fracture.  04-25-19: right hand x-ray: Minimally displaced  fracture involving proximal base of third proximal phalanx.  04-28-19: ct of head: Unchanged small volume right-sided subdural hemorrhage. No mass effect.  NO NEW EXAMS   LABS REVIEWED PREVIOUS;   04-23-19: wbc 12.0; hgb 12.6; hct 40.1; mcv 89.5; plt 122; glucose 141; bun 22; creat 1.22; k+ 4.0; na++ 139; ca 8.8; liver normal albumin 3.4; INR 2.7  04-26-19: wbc 6.2; hgb 11.4; hct 34.9; mcv 89.0; plt 81; glucose 151; bun 12; creat 1.10 ;k+ 4.5; na++ 136; ca 8.4 05-02-19: wbc 5.9; hgb 10.5; hct 32.2; mcv 88.7; plt 155 05-03-19: wbc 6.3; hgb 10.7; hct 32.5; mcv 88.1; plt 171 05-04-19; INR 1.5   NO NEW LABS.    Review of Systems  Constitutional: Negative for malaise/fatigue.  Respiratory: Negative for cough and shortness of breath.   Cardiovascular: Negative for chest pain, palpitations and leg swelling.  Gastrointestinal: Negative for abdominal pain, constipation and heartburn.  Musculoskeletal: Positive for joint pain. Negative for back pain and myalgias.       Pain is managed at this time   Skin: Negative.   Neurological: Negative for dizziness.  Psychiatric/Behavioral: The patient is not nervous/anxious.     Physical Exam Constitutional:      General: She is not in acute distress.    Appearance: She is well-developed. She is not diaphoretic.  Eyes:     Comments: History of bilateral cataract removal     Neck:     Musculoskeletal: Neck supple.     Thyroid: No thyromegaly.  Cardiovascular:     Rate and Rhythm: Normal rate and regular rhythm.     Pulses: Normal pulses.     Heart sounds: Normal heart sounds.  Pulmonary:     Effort: Pulmonary effort is normal. No respiratory distress.     Breath sounds: Normal breath sounds.  Abdominal:     General: Bowel sounds are normal. There is no distension.     Palpations: Abdomen is soft.     Tenderness: There is no abdominal tenderness.  Musculoskeletal:  Right lower leg: No edema.     Left lower leg: No edema.     Comments:  Status post right acetabular fracture Right orbital wall fracture Right inferior rami fracture  Right middle finger fracture Is able to move all extremities     Lymphadenopathy:     Cervical: No cervical adenopathy.  Skin:    General: Skin is warm and dry.     Comments: Bruising is resolving   Neurological:     Mental Status: She is alert and oriented to person, place, and time.     Comments:  4 mm right lateral  subdural hematoma   Psychiatric:        Mood and Affect: Mood normal.         ASSESSMENT/ PLAN:  TODAY;   1. Inferior pubic ramus fracture right sequela/closed nondisplaced fracture of right acetabulum unspecified part or acetabulum sequela/ closed nondisplaced fracture of proximal phalanx of right middle finger: is stable will continue therapy as directed; will follow up with orthopedics; will continue ultram 50 mg every 6 hours as needed.   2. Subdural hematoma: is neurologically stable will monitor  3. Pelvic hematoma: is stable hgb is 10.7 will monitor   PREVIOUS   4. Permanent atrial fibrillation: heart rate is stable: will continue digoxin 0.125 mg daily; cardizem cd 240 mg daily for rate control is on long term coumadin 5 mg daily   5.  Asthma with COPD (Chronic obstruction pulmonary disease) is stable will continue albuterol 2 puffs every 6 hours as needed or neb treatment every 4 hours as needed  6.  Secondary cardiomyopathy: is stable EF 55-60% (03-29-19): will continue lasix 40 mg daily   7. Depression with anxiety: is stable will continue lexapro 10 mg daily and has valium 5 mg daily as needed for 2 weeks. Will monitor  8. GERD without esophagitis: is stable will continue nexium 40 mg twice daily   9. Chronic constipation: is stable will continue miralax daily and colace twice daily   10. CKD (chronic kidney disease) stage 3: is without change bun 22 creat 1.22 will monitor   11. Dyslipidemia: is stable will continue lipitor 10 mg daily      MD  is aware of resident's narcotic use and is in agreement with current plan of care. We will attempt to wean resident as appropriate.  Ok Edwards NP [facility]  Contact [phone] Monday through Friday 8am- 5pm  After hours call [phone]

## 2019-05-11 ENCOUNTER — Non-Acute Institutional Stay (SKILLED_NURSING_FACILITY): Payer: Medicare PPO | Admitting: Internal Medicine

## 2019-05-11 ENCOUNTER — Encounter: Payer: Self-pay | Admitting: Internal Medicine

## 2019-05-11 DIAGNOSIS — S065X9A Traumatic subdural hemorrhage with loss of consciousness of unspecified duration, initial encounter: Secondary | ICD-10-CM

## 2019-05-11 DIAGNOSIS — F418 Other specified anxiety disorders: Secondary | ICD-10-CM

## 2019-05-11 DIAGNOSIS — S62642D Nondisplaced fracture of proximal phalanx of right middle finger, subsequent encounter for fracture with routine healing: Secondary | ICD-10-CM | POA: Diagnosis not present

## 2019-05-11 DIAGNOSIS — S0285XD Fracture of orbit, unspecified, subsequent encounter for fracture with routine healing: Secondary | ICD-10-CM

## 2019-05-11 DIAGNOSIS — E785 Hyperlipidemia, unspecified: Secondary | ICD-10-CM

## 2019-05-11 DIAGNOSIS — K219 Gastro-esophageal reflux disease without esophagitis: Secondary | ICD-10-CM | POA: Diagnosis not present

## 2019-05-11 DIAGNOSIS — I4821 Permanent atrial fibrillation: Secondary | ICD-10-CM

## 2019-05-11 DIAGNOSIS — S0280XD Fracture of other specified skull and facial bones, unspecified side, subsequent encounter for fracture with routine healing: Secondary | ICD-10-CM

## 2019-05-11 DIAGNOSIS — S065XAA Traumatic subdural hemorrhage with loss of consciousness status unknown, initial encounter: Secondary | ICD-10-CM

## 2019-05-11 DIAGNOSIS — S32401S Unspecified fracture of right acetabulum, sequela: Secondary | ICD-10-CM | POA: Diagnosis not present

## 2019-05-11 NOTE — Progress Notes (Signed)
: Provider:  Hennie Duos., MD Location:  [facility] Room Number: 133-P Place of Service:  SNF (31)  PCP: Sharilyn Sites, MD Patient Care Team: Sharilyn Sites, MD as PCP - General (Family Medicine) Satira Sark, MD as PCP - Cardiology (Cardiology)  Extended Emergency Contact Information Primary Emergency Contact: Willow Ora Address: [address]          [place], [place] [address] Johnnette Litter of [place] Phone: [phone] Mobile Phone: [phone] Relation: Niece     Allergies: Amoxicillin-pot clavulanate, Clarithromycin, Clindamycin, Doxycycline, and Penicillins  Chief Complaint  Patient presents with   New Admit To SNF    New admission to [facility]    HPI: Patient is an 85 y.o. female with atrial fib on Coumadin, who was transferred from [facility] after sustaining a fall.  She fell over her dog onto her right side she was worked up extensively at any [employer] and was found to have a nondisplaced right orbital wall fracture, a right acetabular fracture, small subdural hematoma, and a right inferior rami fracture.  Patient was ad hyperlipidemia treated with Lipitor atrial fibrillation treated with digoxin, diltiazem and Coumadin mitted to [facility] pin and then [facility] from 8/15-25.Marland Kitchen  Patient remained hemodynamically stable.  Coumadin was held.  Neurosurgery was consulted for subdural hematoma and recommended CT scanning repeat subdural hematoma remained stable and she required no surgical intervention.  Ortho was consulted for right acetabular fracture and they recommended not an operative management and touchdown weightbearing x8 weeks.  She later complained of right middle finger pain and was found to have a proximal phalanx fracture which was treated with buddy taping and weightbearing as tolerated.  ENT was called consulted for right lateral orbital wall fracture and recommended no intervention.  Coumadin was restarted on  8/22.  Hemoglobin was monitored and remained stable at the time of discharge INR was 1.2.  Patient is admitted to skilled nursing facility for OT/PT.  While at skilled nursing facility patient will be followed for hyperlipidemia treated with Lipitor, atrial fib treated with digoxin diltiazem and Coumadin, and depression treated with Lexapro.  Past Medical History:  Diagnosis Date   Asthma    Atrial fibrillation (HCC)    Chronic rhinitis    COPD (chronic obstructive pulmonary disease) (HCC)    GERD (gastroesophageal reflux disease)    Mitral regurgitation    Mitral valve prolapse    Nasal polyps    Nonischemic cardiomyopathy (HCC)    LVEF 25-30% improved to 45-50%   Obstructive sleep apnea    Transudative pleural effusion     Past Surgical History:  Procedure Laterality Date   APPENDECTOMY  2000   Bilateral cataract surgery     LAPAROSCOPIC NISSEN FUNDOPLICATION  2458   NOSE SURGERY      Allergies as of 05/11/2019      Reactions   Amoxicillin-pot Clavulanate Itching, Swelling   Clarithromycin Itching, Swelling   Clindamycin Itching, Swelling   Doxycycline Itching, Swelling   Penicillins Itching, Swelling   Did it involve swelling of the face/tongue/throat, SOB, or low BP? Yes Did it involve sudden or severe rash/hives, skin peeling, or any reaction on the inside of your mouth or nose? No Did you need to seek medical attention at a hospital or doctor's office? Yes When did it last happen?Over 10 years If all above answers are NO, may proceed with cephalosporin use.      Medication List    Notice  This visit is during an admission. Changes to the med list made in this visit will be reflected in the After Visit Summary of the admission.    No current facility-administered medications on file prior to visit.    Current Outpatient Medications on File Prior to Visit  Medication Sig Dispense Refill   acetaminophen (TYLENOL) 325 MG tablet Take 2 tablets  (650 mg total) by mouth every 6 (six) hours as needed for mild pain, fever or headache.     albuterol (PROVENTIL HFA;VENTOLIN HFA) 108 (90 BASE) MCG/ACT inhaler Inhale 2 puffs into the lungs every 6 (six) hours as needed for wheezing or shortness of breath. 1 Inhaler 11   albuterol (PROVENTIL) (2.5 MG/3ML) 0.083% nebulizer solution INHALE THE CONTENTS OF 1 VIAL USING NEBULIZER EVERY 4 HOURS AS NEEDED FOR WHEEZING OR SHORTNESS OF BREATH 90 mL 12   atorvastatin (LIPITOR) 10 MG tablet Take 10 mg by mouth daily.       bisacodyl (DULCOLAX) 10 MG suppository Place 1 suppository (10 mg total) rectally daily as needed for moderate constipation. 12 suppository 0   diazepam (VALIUM) 5 MG tablet Take 5 mg by mouth daily as needed for anxiety.     digoxin (LANOXIN) 0.125 MG tablet Take 0.125 mg by mouth daily. Hold if apical pulse is less than 60     diltiazem (CARDIZEM CD) 240 MG 24 hr capsule Take 240 mg by mouth daily.     docusate sodium (COLACE) 100 MG capsule Take 100 mg by mouth 2 (two) times daily.     escitalopram (LEXAPRO) 10 MG tablet Take 10 mg by mouth every morning.     esomeprazole (NEXIUM) 40 MG capsule Take 40 mg by mouth 2 (two) times daily.       furosemide (LASIX) 40 MG tablet Take 40 mg by mouth daily.     NON FORMULARY Diet- Regular     phenylephrine-shark liver oil-mineral oil-petrolatum (PREPARATION H) 0.25-3-14-71.9 % rectal ointment Place 1 application rectally 2 (two) times daily as needed for hemorrhoids.     polyethylene glycol (MIRALAX / GLYCOLAX) 17 g packet Take 17 g by mouth daily. 14 each 0   potassium chloride SA (K-DUR) 20 MEQ tablet Take 20 mEq by mouth daily.     warfarin (COUMADIN) 5 MG tablet Take 5 mg by mouth every evening.       No orders of the defined types were placed in this encounter.   Immunization History  Administered Date(s) Administered   Influenza Split 05/24/2012, 06/05/2013, 05/21/2015, 06/08/2016   Influenza Whole 05/28/2010,  05/10/2011   Influenza, High Dose Seasonal PF 06/01/2017, 06/17/2018   Influenza,inj,Quad PF,6+ Mos 06/05/2014   Pneumococcal Conjugate-13 10/08/2014   Pneumococcal Polysaccharide-23 06/17/2018   Tetanus 07/09/2010   Zoster 09/09/2011    Social History   Tobacco Use   Smoking status: Former Smoker    Packs/day: 0.50    Years: 20.00    Pack years: 10.00    Types: Cigarettes    Quit date: 10/08/1980    Years since quitting: 38.6   Smokeless tobacco: Never Used  Substance Use Topics   Alcohol use: No    Alcohol/week: 0.0 standard drinks    Family history is   Family History  Problem Relation Age of Onset   Cancer Mother        Colon   Cancer Father        Colon   Atrial fibrillation Sister    Cancer Brother  Lung   Cancer Brother        Lung      Review of Systems  DATA OBTAINED: from patient, nurse GENERAL:  no fevers, fatigue, appetite changes SKIN: No itching, or rash EYES: No eye pain, redness, discharge EARS: No earache, tinnitus, change in hearing NOSE: No congestion, drainage or bleeding  MOUTH/THROAT: No mouth or tooth pain, No sore throat RESPIRATORY: No cough, wheezing, SOB CARDIAC: No chest pain, palpitations, lower extremity edema  GI: No abdominal pain, No N/V/D or constipation, No heartburn or reflux  GU: No dysuria, frequency or urgency, or incontinence  MUSCULOSKELETAL: No unrelieved bone/joint pain NEUROLOGIC: No headache, dizziness or focal weakness PSYCHIATRIC: No c/o anxiety or sadness   Vitals:   05/11/19 0854  BP: 127/74  Pulse: 85  Resp: 20  Temp: 98.3 F (36.8 C)    SpO2 Readings from Last 1 Encounters:  05/03/19 96%   Body mass index is 24.63 kg/m.     Physical Exam  GENERAL APPEARANCE: Alert, conversant,  No acute distress.;  Patient appears very comfortable SKIN: No diaphoresis rash HEAD: Normocephalic, atraumatic  EYES: Conjunctiva/lids clear. Pupils round, reactive. EOMs intact.  EARS:  External exam WNL, canals clear. Hearing grossly normal.  NOSE: No deformity or discharge.  MOUTH/THROAT: Lips w/o lesions  RESPIRATORY: Breathing is even, unlabored. Lung sounds are clear   CARDIOVASCULAR: Heart RRR no murmurs, rubs or gallops. No peripheral edema.   GASTROINTESTINAL: Abdomen is soft, non-tender, not distended w/ normal bowel sounds. GENITOURINARY: Bladder non tender, not distended  MUSCULOSKELETAL: No abnormal joints or musculature NEUROLOGIC:  Cranial nerves 2-12 grossly intact. Moves all extremities  PSYCHIATRIC: Mood and affect appropriate to situation, no behavioral issues  Patient Active Problem List   Diagnosis Date Noted   Depression with anxiety 05/06/2019   GERD without esophagitis 05/06/2019   Chronic constipation 05/06/2019   CKD (chronic kidney disease) stage 3, GFR 30-59 ml/min (HCC) 05/06/2019   Subdural hematoma ([place]) 05/06/2019   Inferior pubic ramus fracture, right, sequela 05/06/2019   Closed right acetabular fracture ([place]) 05/06/2019   Closed nondisplaced fracture of proximal phalanx of right middle finger 05/06/2019   Pelvic hematoma, female 05/06/2019   Fall 04/24/2019   Pneumonia involving right lung 06/18/2018   Hoarseness 07/22/2015   Acute rhinitis 03/18/2015   Sinusitis, acute maxillary 04/02/2014   Encounter for therapeutic drug monitoring 10/05/2013   Shingles 09/06/2013   Postherpetic neuralgia 09/04/2013   Arthritis, lumbar spine 04/20/2013   Back pain 04/20/2013   Mild anxiety 08/20/2012   Allergic rhinitis due to pollen 11/30/2011   Lung nodules 11/30/2011   Renal insufficiency 01/16/2011   Long term current use of anticoagulant 12/19/2010   ANEMIA 10/09/2010   Secondary cardiomyopathy ([place]) 06/21/2010   Atrial fibrillation ([place]) 05/28/2010   MITRAL REGURGITATION 05/25/2008   BARRETT'S ESOPHAGUS, HX OF 05/25/2008   Asthma with COPD (chronic obstructive pulmonary disease) ([place]) 11/12/2007    Obstructive sleep apnea 11/12/2007   MITRAL VALVE PROLAPSE 09/03/2007      Labs reviewed: Basic Metabolic Panel:    Component Value Date/Time   NA 136 04/26/2019 0132   K 4.5 04/26/2019 0132   CL 106 04/26/2019 0132   CO2 24 04/26/2019 0132   GLUCOSE 151 (H) 04/26/2019 0132   BUN 12 04/26/2019 0132   CREATININE 1.10 (H) 04/26/2019 0132   CALCIUM 8.4 (L) 04/26/2019 0132   PROT 6.3 (L) 04/24/2019 0154   ALBUMIN 3.1 (L) 04/24/2019 0154   AST 26 04/24/2019 0154  ALT 20 04/24/2019 0154   ALKPHOS 51 04/24/2019 0154   BILITOT 0.7 04/24/2019 0154   GFRNONAA 47 (L) 04/26/2019 0132   GFRAA 54 (L) 04/26/2019 0132    Recent Labs    04/23/19 2216 04/24/19 0154 04/26/19 0132  NA 139 135 136  K 4.0 4.3 4.5  CL 107 104 106  CO2 25 21* 24  GLUCOSE 141* 177* 151*  BUN 22 19 12   CREATININE 1.22* 1.15* 1.10*  CALCIUM 8.8* 8.5* 8.4*   Liver Function Tests: Recent Labs    04/23/19 2216 04/24/19 0154  AST 32 26  ALT 23 20  ALKPHOS 58 51  BILITOT 0.5 0.7  PROT 6.7 6.3*  ALBUMIN 3.4* 3.1*   No results for input(s): LIPASE, AMYLASE in the last 8760 hours. No results for input(s): AMMONIA in the last 8760 hours. CBC: Recent Labs    05/22/18 1146 04/23/19 2216  04/29/19 0136 05/02/19 0248 05/03/19 0104  WBC 10.0 12.0*   < > 5.0 5.9 6.3  NEUTROABS 8.9* 10.3*  --   --   --   --   HGB 12.4 12.6   < > 10.1* 10.5* 10.7*  HCT 37.2 40.1   < > 31.7* 32.2* 32.5*  MCV 88.8 89.5   < > 90.3 88.7 88.1  PLT 172 122*   < > 101* 155 171   < > = values in this interval not displayed.   Lipid No results for input(s): CHOL, HDL, LDLCALC, TRIG in the last 8760 hours.  Cardiac Enzymes: No results for input(s): CKTOTAL, CKMB, CKMBINDEX, TROPONINI in the last 8760 hours. BNP: No results for input(s): BNP in the last 8760 hours. No results found for: MICROALBUR No results found for: HGBA1C Lab Results  Component Value Date   TSH 2.219 09/25/2010   No results found for:  VITAMINB12 No results found for: FOLATE Lab Results  Component Value Date   IRON 36 (L) 09/25/2010   TIBC 313 09/25/2010   FERRITIN 18 09/25/2010    Imaging and Procedures obtained prior to SNF admission: No results found.   Not all labs, radiology exams or other studies done during hospitalization come through on my EPIC note; however they are reviewed by me.    Assessment and Plan  Fall resulting in right orbital fracture/right acetabular fracture/subdural hematoma/and right middle finger proximal phalanx fracture- neurosurgery, orthopedics and ENT were consulted and all recommended conservative management.  Coumadin was stopped and then restarted and hemoglobin remained stable SNF- patient is admitted for OT/PT.  We will follow-up CBC  Atrial fibrillation SNF- continue DuoNeb ties him to 140 mg daily and digoxin 0.125 mg daily for rate and Coumadin as prophylaxis.  Starting with 7.5 mill grams daily on every day except Sunday Tuesday and Thursdays on which 5 mg will be taken INR will be checked every 2 to 3 days until stable  Hyperlipidemia SNF not stated as uncontrolled continue Lipitor 10 mg daily  Depression SNF-appears controlled; continue Lexapro 10 mg daily  GERD SNF- no problems continue Nexium 40 mg twice daily   Time spent greater than 45 minutes;> 50% of time with patient was spent reviewing records, labs, tests and studies, counseling and developing plan of care Depression Hennie Duos, MD

## 2019-05-12 ENCOUNTER — Other Ambulatory Visit: Payer: Self-pay | Admitting: Adult Health

## 2019-05-12 DIAGNOSIS — G894 Chronic pain syndrome: Secondary | ICD-10-CM | POA: Insufficient documentation

## 2019-05-12 MED ORDER — TRAMADOL HCL 50 MG PO TABS: 50.0000 mg | ORAL_TABLET | Freq: Four times a day (QID) | ORAL | 0 refills | Status: DC | PRN

## 2019-05-16 ENCOUNTER — Encounter: Payer: Self-pay | Admitting: Internal Medicine

## 2019-05-16 DIAGNOSIS — S0285XA Fracture of orbit, unspecified, initial encounter for closed fracture: Secondary | ICD-10-CM | POA: Insufficient documentation

## 2019-05-16 DIAGNOSIS — E785 Hyperlipidemia, unspecified: Secondary | ICD-10-CM | POA: Insufficient documentation

## 2019-05-16 DIAGNOSIS — S0280XA Fracture of other specified skull and facial bones, unspecified side, initial encounter for closed fracture: Secondary | ICD-10-CM | POA: Insufficient documentation

## 2019-05-17 ENCOUNTER — Other Ambulatory Visit (HOSPITAL_COMMUNITY)
Admission: RE | Admit: 2019-05-17 | Discharge: 2019-05-17 | Disposition: A | Discharge status: Home / Self Care | Payer: Medicare PPO | Source: Skilled Nursing Facility | Attending: Internal Medicine | Admitting: Internal Medicine

## 2019-05-17 ENCOUNTER — Non-Acute Institutional Stay: Payer: Self-pay | Admitting: Adult Health

## 2019-05-17 ENCOUNTER — Encounter: Payer: Self-pay | Admitting: Adult Health

## 2019-05-17 ENCOUNTER — Non-Acute Institutional Stay (SKILLED_NURSING_FACILITY): Payer: Medicare PPO | Admitting: Adult Health

## 2019-05-17 DIAGNOSIS — S065X9A Traumatic subdural hemorrhage with loss of consciousness of unspecified duration, initial encounter: Secondary | ICD-10-CM

## 2019-05-17 DIAGNOSIS — Z7901 Long term (current) use of anticoagulants: Secondary | ICD-10-CM | POA: Diagnosis not present

## 2019-05-17 DIAGNOSIS — S32401S Unspecified fracture of right acetabulum, sequela: Secondary | ICD-10-CM

## 2019-05-17 DIAGNOSIS — I4821 Permanent atrial fibrillation: Secondary | ICD-10-CM

## 2019-05-17 DIAGNOSIS — S0285XS Fracture of orbit, unspecified, sequela: Secondary | ICD-10-CM

## 2019-05-17 DIAGNOSIS — S32591D Other specified fracture of right pubis, subsequent encounter for fracture with routine healing: Secondary | ICD-10-CM | POA: Insufficient documentation

## 2019-05-17 DIAGNOSIS — S065XAA Traumatic subdural hemorrhage with loss of consciousness status unknown, initial encounter: Secondary | ICD-10-CM

## 2019-05-17 DIAGNOSIS — S0280XS Fracture of other specified skull and facial bones, unspecified side, sequela: Secondary | ICD-10-CM

## 2019-05-17 LAB — PROTIME-INR
INR: 1.7 — ABNORMAL HIGH (ref 0.8–1.2)
Prothrombin Time: 19.9 seconds — ABNORMAL HIGH (ref 11.4–15.2)

## 2019-05-17 NOTE — Progress Notes (Signed)
Location:    [facility] Room Number: 133/P Place of Service:  SNF (31)   CODE STATUS: Full Code  Allergies  Allergen Reactions  . Amoxicillin-Pot Clavulanate Itching and Swelling  . Clarithromycin Itching and Swelling  . Clindamycin Itching and Swelling  . Doxycycline Itching and Swelling  . Penicillins Itching and Swelling    Did it involve swelling of the face/tongue/throat, SOB, or low BP? Yes Did it involve sudden or severe rash/hives, skin peeling, or any reaction on the inside of your mouth or nose? No Did you need to seek medical attention at a hospital or doctor's office? Yes When did it last happen?Over 10 years If all above answers are "NO", may proceed with cephalosporin use.     Chief Complaint  Patient presents with  . Anticoagulation    INR    HPI:  She is on long term coumadin therapy for her atrial fibrillation. Her INR goal is 2-3. Her INR today is 1.7 she is presently taking 5 mg coumadin daily. There are no reports of abnormal bleeding or bruising. There are no reports of missed doses. She denies any chest pain or palpitations.    Past Medical History:  Diagnosis Date  . Asthma   . Atrial fibrillation ([place])   . Chronic rhinitis   . COPD (chronic obstructive pulmonary disease) ([place])   . GERD (gastroesophageal reflux disease)   . Mitral regurgitation   . Mitral valve prolapse   . Nasal polyps   . Nonischemic cardiomyopathy (HCC)    LVEF 25-30% improved to 45-50%  . Obstructive sleep apnea   . Transudative pleural effusion     Past Surgical History:  Procedure Laterality Date  . APPENDECTOMY  2000  . Bilateral cataract surgery    . LAPAROSCOPIC NISSEN FUNDOPLICATION  9604  . NOSE SURGERY      Social History   Socioeconomic History  . Marital status: Single    Spouse name: Not on file  . Number of children: Not on file  . Years of education: Not on file  . Highest education level: Not on file  Occupational  History  . Not on file  Social Needs  . Financial resource strain: Not on file  . Food insecurity    Worry: Not on file    Inability: Not on file  . Transportation needs    Medical: Not on file    Non-medical: Not on file  Tobacco Use  . Smoking status: Former Smoker    Packs/day: 0.50    Years: 20.00    Pack years: 10.00    Types: Cigarettes    Quit date: 10/08/1980    Years since quitting: 38.6  . Smokeless tobacco: Never Used  Substance and Sexual Activity  . Alcohol use: No    Alcohol/week: 0.0 standard drinks  . Drug use: No  . Sexual activity: Not on file  Lifestyle  . Physical activity    Days per week: Not on file    Minutes per session: Not on file  . Stress: Not on file  Relationships  . Social Herbalist on phone: Not on file    Gets together: Not on file    Attends religious service: Not on file    Active member of club or organization: Not on file    Attends meetings of clubs or organizations: Not on file    Relationship status: Not on file  . Intimate partner violence  Fear of current or ex partner: Not on file    Emotionally abused: Not on file    Physically abused: Not on file    Forced sexual activity: Not on file  Other Topics Concern  . Not on file  Social History Narrative   Former smoker, quit in 1982.  Nondrinker.  Mother and father both had colon cancer.    Family History  Problem Relation Age of Onset  . Cancer Mother        Colon  . Cancer Father        Colon  . Atrial fibrillation Sister   . Cancer Brother        Lung  . Cancer Brother        Lung      VITAL SIGNS BP 114/60   Pulse 74   Temp 97.8 F (36.6 C) (Oral)   Resp 20   Ht 5\' 6"  (1.676 m)   Wt 145 lb 6.4 oz (66 kg)   BMI 23.47 kg/m   No facility-administered encounter medications on file as of 05/17/2019.    Outpatient Encounter Medications as of 05/17/2019  Medication Sig  . acetaminophen (TYLENOL) 325 MG tablet Take 2 tablets (650 mg total) by mouth  every 6 (six) hours as needed for mild pain, fever or headache.  . albuterol (PROVENTIL HFA;VENTOLIN HFA) 108 (90 BASE) MCG/ACT inhaler Inhale 2 puffs into the lungs every 6 (six) hours as needed for wheezing or shortness of breath.  Marland Kitchen albuterol (PROVENTIL) (2.5 MG/3ML) 0.083% nebulizer solution INHALE THE CONTENTS OF 1 VIAL USING NEBULIZER EVERY 4 HOURS AS NEEDED FOR WHEEZING OR SHORTNESS OF BREATH  . atorvastatin (LIPITOR) 10 MG tablet Take 10 mg by mouth daily.    . bisacodyl (DULCOLAX) 10 MG suppository Place 1 suppository (10 mg total) rectally daily as needed for moderate constipation.  . digoxin (LANOXIN) 0.125 MG tablet Take 0.125 mg by mouth daily. Hold if apical pulse is less than 60  . diltiazem (CARDIZEM CD) 240 MG 24 hr capsule Take 240 mg by mouth daily.  Marland Kitchen docusate sodium (COLACE) 100 MG capsule Take 100 mg by mouth 2 (two) times daily.  Marland Kitchen escitalopram (LEXAPRO) 10 MG tablet Take 10 mg by mouth every morning.  Marland Kitchen esomeprazole (NEXIUM) 40 MG capsule Take 40 mg by mouth 2 (two) times daily.    . furosemide (LASIX) 40 MG tablet Take 40 mg by mouth daily.  . NON FORMULARY Diet- Regular  . phenylephrine-shark liver oil-mineral oil-petrolatum (PREPARATION H) 0.25-3-14-71.9 % rectal ointment Place 1 application rectally 2 (two) times daily as needed for hemorrhoids.  . polyethylene glycol (MIRALAX / GLYCOLAX) 17 g packet Take 17 g by mouth daily.  . potassium chloride SA (K-DUR) 20 MEQ tablet Take 20 mEq by mouth daily.  . traMADol (ULTRAM) 50 MG tablet Take 1 tablet (50 mg total) by mouth every 6 (six) hours as needed.  . warfarin (COUMADIN) 5 MG tablet Take 5 mg by mouth every evening.  . [DISCONTINUED] diazepam (VALIUM) 5 MG tablet Take 5 mg by mouth daily as needed for anxiety.     SIGNIFICANT DIAGNOSTIC EXAMS   PREVIOUS;   04-23-19: ct of abdomen and pelvis:  1. Comminuted fracture of the right acetabulum with a moderate sized surrounding pelvic hematoma. There is a nondisplaced  fracture of the right inferior pubic ramus. 2. Dilated loops of small bowel without evidence for a distinct transition point. Findings may represent ileus or partial small bowel obstruction. 3. Significant  cardiomegaly. 4. Large amount of stool throughout the colon. 5. Mildly dilated common bile duct and pancreatic duct without evidence for discrete pancreatic mass. Follow-up with a nonemergent outpatient MRCP is recommended for further evaluation of this finding.  04-23-19: ct of right hip:  1. Acute comminuted fracture of the right acetabulum as detailed above. Nondisplaced fracture of the inferior pubic ramus on the right. 2. Moderate size right-sided pelvic hematoma.  04-23-19: ct of head cervical spine and maxillofacial:  1. 4 mm right lateral subdural hematoma and 4 mm of midline shift to the left. 2. Mildly comminuted and minimally displaced right lateral orbital wall fracture. 3. Right periorbital hematoma. 4. No skull fracture. 5. No cervical spine fracture or subluxation. 6. Multilevel cervical spine degenerative changes. 7. Mild diffuse cerebral and cerebellar atrophy and mild chronic small vessel white matter ischemic changes in both cerebral hemispheres. 8. Bilateral carotid artery atheromatous calcifications.   04-24-19: right hip and pelvic x-ray: Right acetabular and pubic rami fractures.   04-24-19: ct of head: Redistribution of small right-sided subdural hematoma, now layering along the right leaflet of the tentorium cerebelli.  04-24-19: pelvic x-ray: Unchanged alignment of comminuted right acetabular fracture.  04-25-19: right hand x-ray: Minimally displaced fracture involving proximal base of third proximal phalanx.  04-28-19: ct of head: Unchanged small volume right-sided subdural hemorrhage. No mass effect.  NO NEW EXAMS   LABS REVIEWED PREVIOUS;   04-23-19: wbc 12.0; hgb 12.6; hct 40.1; mcv 89.5; plt 122; glucose 141; bun 22; creat 1.22; k+ 4.0; na++ 139; ca 8.8;  liver normal albumin 3.4; INR 2.7  04-26-19: wbc 6.2; hgb 11.4; hct 34.9; mcv 89.0; plt 81; glucose 151; bun 12; creat 1.10 ;k+ 4.5; na++ 136; ca 8.4 05-02-19: wbc 5.9; hgb 10.5; hct 32.2; mcv 88.7; plt 155 05-03-19: wbc 6.3; hgb 10.7; hct 32.5; mcv 88.1; plt 171 05-04-19; INR 1.5   TODAY;   05-17-19: INR 1.7    Review of Systems  Constitutional: Negative for malaise/fatigue.  Respiratory: Negative for cough and shortness of breath.   Cardiovascular: Negative for chest pain, palpitations and leg swelling.  Gastrointestinal: Negative for abdominal pain, constipation and heartburn.  Musculoskeletal: Negative for back pain, joint pain and myalgias.  Skin: Negative.   Neurological: Negative for dizziness.  Psychiatric/Behavioral: The patient is not nervous/anxious.      Physical Exam Constitutional:      General: She is not in acute distress.    Appearance: She is well-developed. She is not diaphoretic.  Eyes:     Comments: History of bilateral cataract removal      Neck:     Musculoskeletal: Neck supple.     Thyroid: No thyromegaly.  Cardiovascular:     Rate and Rhythm: Normal rate and regular rhythm.     Pulses: Normal pulses.     Heart sounds: Normal heart sounds.  Pulmonary:     Effort: Pulmonary effort is normal. No respiratory distress.     Breath sounds: Normal breath sounds.  Abdominal:     General: Bowel sounds are normal. There is no distension.     Palpations: Abdomen is soft.     Tenderness: There is no abdominal tenderness.  Musculoskeletal:     Right lower leg: No edema.     Left lower leg: No edema.     Comments: Status post right acetabular fracture Right orbital wall fracture Right inferior rami fracture  Right middle finger fracture Is able to move all extremities      Lymphadenopathy:  Cervical: No cervical adenopathy.  Skin:    General: Skin is warm and dry.  Neurological:     Mental Status: She is alert and oriented to person, place, and time.      Comments: 4 mm right lateral  subdural hematoma    Psychiatric:        Mood and Affect: Mood normal.       ASSESSMENT/ PLAN:  TODAY;   1. Permanent atrial fibrillation 2. Long term current use of anticoagulant   For her INR of 1.7 will begin coumadin 5 mg on Monday Tuesday Wednesday Friday; 6 mg on Saturday Sunday Thursday Will check INR on 05-24-19    MD is aware of resident's narcotic use and is in agreement with current plan of care. We will attempt to wean resident as appropriate.  Ok Edwards NP [facility]  Contact [phone] Monday through Friday 8am- 5pm  After hours call [phone]

## 2019-05-18 ENCOUNTER — Encounter: Payer: Self-pay | Admitting: Adult Health

## 2019-05-18 ENCOUNTER — Non-Acute Institutional Stay (SKILLED_NURSING_FACILITY): Payer: Medicare PPO | Admitting: Adult Health

## 2019-05-18 DIAGNOSIS — I4821 Permanent atrial fibrillation: Secondary | ICD-10-CM

## 2019-05-18 DIAGNOSIS — J449 Chronic obstructive pulmonary disease, unspecified: Secondary | ICD-10-CM

## 2019-05-18 DIAGNOSIS — F418 Other specified anxiety disorders: Secondary | ICD-10-CM | POA: Diagnosis not present

## 2019-05-18 NOTE — Progress Notes (Signed)
Location:    [facility] Room Number: 133/P Place of Service:  SNF (31)   CODE STATUS: Full Code  Allergies  Allergen Reactions  . Amoxicillin-Pot Clavulanate Itching and Swelling  . Clarithromycin Itching and Swelling  . Clindamycin Itching and Swelling  . Doxycycline Itching and Swelling  . Penicillins Itching and Swelling    Did it involve swelling of the face/tongue/throat, SOB, or low BP? Yes Did it involve sudden or severe rash/hives, skin peeling, or any reaction on the inside of your mouth or nose? No Did you need to seek medical attention at a hospital or doctor's office? Yes When did it last happen?Over 10 years If all above answers are "NO", may proceed with cephalosporin use.     Chief Complaint  Patient presents with  . Medical Management of Chronic Issues        Depression with anxiety:  Permanent atrial fibrillation:  Asthma with COPD (Chronic obstructive pulmonary disease)   Weekly follow up for the first 30 days post hospitalization.     HPI:  She is a 85 year old short term rehab patient being seen for the management of her chronic illnesses: depression with anxiety; afib; copd. She denies any cough; shortness of breath; chest pain or palpitations. She has not used her valium we will stop this. She has taken ultram for a long time for her arthritic pain.   Past Medical History:  Diagnosis Date  . Asthma   . Atrial fibrillation ([place])   . Chronic rhinitis   . COPD (chronic obstructive pulmonary disease) ([place])   . GERD (gastroesophageal reflux disease)   . Mitral regurgitation   . Mitral valve prolapse   . Nasal polyps   . Nonischemic cardiomyopathy ([facility])    LVEF 25-30% improved to 45-50%  . Obstructive sleep apnea   . Transudative pleural effusion     Past Surgical History:  Procedure Laterality Date  . APPENDECTOMY  2000  . Bilateral cataract surgery    . LAPAROSCOPIC NISSEN FUNDOPLICATION  8786  . NOSE SURGERY       Social History   Socioeconomic History  . Marital status: Single    Spouse name: Not on file  . Number of children: Not on file  . Years of education: Not on file  . Highest education level: Not on file  Occupational History  . Not on file  Social Needs  . Financial resource strain: Not on file  . Food insecurity    Worry: Not on file    Inability: Not on file  . Transportation needs    Medical: Not on file    Non-medical: Not on file  Tobacco Use  . Smoking status: Former Smoker    Packs/day: 0.50    Years: 20.00    Pack years: 10.00    Types: Cigarettes    Quit date: 10/08/1980    Years since quitting: 38.6  . Smokeless tobacco: Never Used  Substance and Sexual Activity  . Alcohol use: No    Alcohol/week: 0.0 standard drinks  . Drug use: No  . Sexual activity: Not on file  Lifestyle  . Physical activity    Days per week: Not on file    Minutes per session: Not on file  . Stress: Not on file  Relationships  . Social Herbalist on phone: Not on file    Gets together: Not on file    Attends religious service: Not on file  Active member of club or organization: Not on file    Attends meetings of clubs or organizations: Not on file    Relationship status: Not on file  . Intimate partner violence    Fear of current or ex partner: Not on file    Emotionally abused: Not on file    Physically abused: Not on file    Forced sexual activity: Not on file  Other Topics Concern  . Not on file  Social History Narrative   Former smoker, quit in 1982.  Nondrinker.  Mother and father both had colon cancer.    Family History  Problem Relation Age of Onset  . Cancer Mother        Colon  . Cancer Father        Colon  . Atrial fibrillation Sister   . Cancer Brother        Lung  . Cancer Brother        Lung      VITAL SIGNS BP 99/63   Pulse 81   Temp (!) 97.5 F (36.4 C) (Oral)   Resp 18   Ht 5\' 6"  (1.676 m)   Wt 145 lb 6.4 oz (66 kg)   BMI 23.47  kg/m   No facility-administered encounter medications on file as of 05/18/2019.     Outpatient Encounter Medications as of 05/17/2019  Medication Sig  . acetaminophen (TYLENOL) 325 MG tablet Take 2 tablets (650 mg total) by mouth every 6 (six) hours as needed for mild pain, fever or headache.  . albuterol (PROVENTIL HFA;VENTOLIN HFA) 108 (90 BASE) MCG/ACT inhaler Inhale 2 puffs into the lungs every 6 (six) hours as needed for wheezing or shortness of breath.  Marland Kitchen albuterol (PROVENTIL) (2.5 MG/3ML) 0.083% nebulizer solution INHALE THE CONTENTS OF 1 VIAL USING NEBULIZER EVERY 4 HOURS AS NEEDED FOR WHEEZING OR SHORTNESS OF BREATH  . atorvastatin (LIPITOR) 10 MG tablet Take 10 mg by mouth daily.    . bisacodyl (DULCOLAX) 10 MG suppository Place 1 suppository (10 mg total) rectally daily as needed for moderate constipation.  . digoxin (LANOXIN) 0.125 MG tablet Take 0.125 mg by mouth daily. Hold if apical pulse is less than 60  . diltiazem (CARDIZEM CD) 240 MG 24 hr capsule Take 240 mg by mouth daily.  Marland Kitchen docusate sodium (COLACE) 100 MG capsule Take 100 mg by mouth 2 (two) times daily.  Marland Kitchen escitalopram (LEXAPRO) 10 MG tablet Take 10 mg by mouth every morning.  Marland Kitchen esomeprazole (NEXIUM) 40 MG capsule Take 40 mg by mouth 2 (two) times daily.    . furosemide (LASIX) 40 MG tablet Take 40 mg by mouth daily.  . NON FORMULARY Diet- Regular  . phenylephrine-shark liver oil-mineral oil-petrolatum (PREPARATION H) 0.25-3-14-71.9 % rectal ointment Place 1 application rectally 2 (two) times daily as needed for hemorrhoids.  . polyethylene glycol (MIRALAX / GLYCOLAX) 17 g packet Take 17 g by mouth daily.  . potassium chloride SA (K-DUR) 20 MEQ tablet Take 20 mEq by mouth daily.  . traMADol (ULTRAM) 50 MG tablet Take 1 tablet (50 mg total) by mouth every 6 (six) hours as needed.  . warfarin (COUMADIN) 5 MG tablet Take 5 mg by mouth every evening.  Marland Kitchen ] diazepam (VALIUM) 5 MG tablet Take 5 mg by mouth daily as needed for  anxiety.     SIGNIFICANT DIAGNOSTIC EXAMS   PREVIOUS;   04-23-19: ct of abdomen and pelvis:  1. Comminuted fracture of the right acetabulum with a moderate  sized surrounding pelvic hematoma. There is a nondisplaced fracture of the right inferior pubic ramus. 2. Dilated loops of small bowel without evidence for a distinct transition point. Findings may represent ileus or partial small bowel obstruction. 3. Significant cardiomegaly. 4. Large amount of stool throughout the colon. 5. Mildly dilated common bile duct and pancreatic duct without evidence for discrete pancreatic mass. Follow-up with a nonemergent outpatient MRCP is recommended for further evaluation of this finding.  04-23-19: ct of right hip:  1. Acute comminuted fracture of the right acetabulum as detailed above. Nondisplaced fracture of the inferior pubic ramus on the right. 2. Moderate size right-sided pelvic hematoma.  04-23-19: ct of head cervical spine and maxillofacial:  1. 4 mm right lateral subdural hematoma and 4 mm of midline shift to the left. 2. Mildly comminuted and minimally displaced right lateral orbital wall fracture. 3. Right periorbital hematoma. 4. No skull fracture. 5. No cervical spine fracture or subluxation. 6. Multilevel cervical spine degenerative changes. 7. Mild diffuse cerebral and cerebellar atrophy and mild chronic small vessel white matter ischemic changes in both cerebral hemispheres. 8. Bilateral carotid artery atheromatous calcifications.   04-24-19: right hip and pelvic x-ray: Right acetabular and pubic rami fractures.   04-24-19: ct of head: Redistribution of small right-sided subdural hematoma, now layering along the right leaflet of the tentorium cerebelli.  04-24-19: pelvic x-ray: Unchanged alignment of comminuted right acetabular fracture.  04-25-19: right hand x-ray: Minimally displaced fracture involving proximal base of third proximal phalanx.  04-28-19: ct of head: Unchanged  small volume right-sided subdural hemorrhage. No mass effect.  NO NEW EXAMS   LABS REVIEWED PREVIOUS;   04-23-19: wbc 12.0; hgb 12.6; hct 40.1; mcv 89.5; plt 122; glucose 141; bun 22; creat 1.22; k+ 4.0; na++ 139; ca 8.8; liver normal albumin 3.4; INR 2.7  04-26-19: wbc 6.2; hgb 11.4; hct 34.9; mcv 89.0; plt 81; glucose 151; bun 12; creat 1.10 ;k+ 4.5; na++ 136; ca 8.4 05-02-19: wbc 5.9; hgb 10.5; hct 32.2; mcv 88.7; plt 155 05-03-19: wbc 6.3; hgb 10.7; hct 32.5; mcv 88.1; plt 171 05-04-19; INR 1.5  05-17-19: INR 1.7  NO NEW LABS.    Review of Systems  Constitutional: Negative for malaise/fatigue.  Respiratory: Negative for cough and shortness of breath.   Cardiovascular: Negative for chest pain, palpitations and leg swelling.  Gastrointestinal: Negative for abdominal pain, constipation and heartburn.  Musculoskeletal: Negative for back pain, joint pain and myalgias.  Skin: Negative.   Neurological: Negative for dizziness.  Psychiatric/Behavioral: The patient is not nervous/anxious.      Physical Exam Constitutional:      General: She is not in acute distress.    Appearance: She is well-developed. She is not diaphoretic.  Eyes:     Comments: History of bilateral cataract removal       Neck:     Thyroid: No thyromegaly.  Cardiovascular:     Rate and Rhythm: Normal rate and regular rhythm.     Heart sounds: Normal heart sounds.  Pulmonary:     Effort: Pulmonary effort is normal. No respiratory distress.     Breath sounds: Normal breath sounds.  Abdominal:     General: Bowel sounds are normal. There is no distension.     Palpations: Abdomen is soft.     Tenderness: There is no abdominal tenderness.  Musculoskeletal:     Right lower leg: No edema.     Left lower leg: No edema.     Comments: Status post right acetabular  fracture Right orbital wall fracture Right inferior rami fracture  Right middle finger fracture Is able to move all extremities       Lymphadenopathy:      Cervical: No cervical adenopathy.  Skin:    General: Skin is warm and dry.  Neurological:     Mental Status: She is alert and oriented to person, place, and time.     Comments: 4 mm right lateral  subdural hematoma     Psychiatric:        Mood and Affect: Mood normal.      ASSESSMENT/ PLAN:  TODAY;   1. Depression with anxiety: is stable will continue lexapro 10 mg daily and will stop valium due to nonuse   2. Permanent atrial fibrillation: heart rate is stable will continue digoxin 0.125 mg daily cardizem cd 240 mg daily for rate control is on long term coumadin therapy.   3. Asthma with COPD (Chronic obstructive pulmonary disease) is stable will continue albuterol 2 puffs every 6 hours as needed.   PREVIOUS   4.  Secondary cardiomyopathy: is stable EF 55-60% (03-29-19): will continue lasix 40 mg daily   5. GERD without esophagitis: is stable will continue nexium 40 mg twice daily   6. Chronic constipation: is stable will continue miralax daily and colace twice daily   7. CKD (chronic kidney disease) stage 3: is without change bun 22 creat 1.22 will monitor   8. Dyslipidemia: is stable will continue lipitor 10 mg daily   9 Inferior pubic ramus fracture right sequela/closed nondisplaced fracture of right acetabulum unspecified part or acetabulum sequela/ closed nondisplaced fracture of proximal phalanx of right middle finger: is stable will continue therapy as directed; will follow up with orthopedics; will continue ultram 50 mg every 6 hours as needed she has used this medication for long term pain.    10. Subdural hematoma: is neurologically stable will monitor  11. Pelvic hematoma: is stable hgb is 10.7 will monitor      MD is aware of resident's narcotic use and is in agreement with current plan of care. We will attempt to wean resident as appropriate.  Ok Edwards NP [facility]  Contact [phone] Monday through Friday 8am- 5pm  After hours call  [phone]

## 2019-05-20 ENCOUNTER — Non-Acute Institutional Stay (SKILLED_NURSING_FACILITY): Payer: Medicare PPO | Admitting: Adult Health

## 2019-05-20 ENCOUNTER — Other Ambulatory Visit: Payer: Self-pay | Admitting: Adult Health

## 2019-05-20 ENCOUNTER — Encounter: Payer: Self-pay | Admitting: Adult Health

## 2019-05-20 DIAGNOSIS — S62642D Nondisplaced fracture of proximal phalanx of right middle finger, subsequent encounter for fracture with routine healing: Secondary | ICD-10-CM | POA: Diagnosis not present

## 2019-05-20 DIAGNOSIS — S0280XS Fracture of other specified skull and facial bones, unspecified side, sequela: Secondary | ICD-10-CM

## 2019-05-20 DIAGNOSIS — S32591S Other specified fracture of right pubis, sequela: Secondary | ICD-10-CM

## 2019-05-20 DIAGNOSIS — S32401S Unspecified fracture of right acetabulum, sequela: Secondary | ICD-10-CM

## 2019-05-20 DIAGNOSIS — S0285XS Fracture of orbit, unspecified, sequela: Secondary | ICD-10-CM

## 2019-05-20 MED ORDER — ESCITALOPRAM OXALATE 10 MG PO TABS: 10.0000 mg | ORAL_TABLET | Freq: Every morning | ORAL | 0 refills | Status: AC

## 2019-05-20 MED ORDER — DILTIAZEM HCL ER COATED BEADS 240 MG PO CP24: 240.0000 mg | ORAL_CAPSULE | Freq: Every day | ORAL | 0 refills | Status: DC

## 2019-05-20 MED ORDER — WARFARIN SODIUM 6 MG PO TABS: 6.0000 mg | ORAL_TABLET | Freq: Every day | ORAL | 0 refills | Status: DC

## 2019-05-20 MED ORDER — ATORVASTATIN CALCIUM 10 MG PO TABS: 10.0000 mg | ORAL_TABLET | Freq: Every day | ORAL | 0 refills | Status: DC

## 2019-05-20 MED ORDER — WARFARIN SODIUM 5 MG PO TABS: 5.0000 mg | ORAL_TABLET | Freq: Every day | ORAL | 0 refills | Status: DC

## 2019-05-20 MED ORDER — ALBUTEROL SULFATE HFA 108 (90 BASE) MCG/ACT IN AERS: 2.0000 | INHALATION_SPRAY | Freq: Four times a day (QID) | RESPIRATORY_TRACT | 0 refills | Status: DC | PRN

## 2019-05-20 MED ORDER — TRAMADOL HCL 50 MG PO TABS: 50.0000 mg | ORAL_TABLET | Freq: Four times a day (QID) | ORAL | 0 refills | Status: DC | PRN

## 2019-05-20 MED ORDER — DIGOXIN 125 MCG PO TABS: 0.1250 mg | ORAL_TABLET | Freq: Every day | ORAL | 0 refills | Status: DC

## 2019-05-20 MED ORDER — POTASSIUM CHLORIDE CRYS ER 20 MEQ PO TBCR: 20.0000 meq | EXTENDED_RELEASE_TABLET | Freq: Every day | ORAL | 0 refills | Status: AC

## 2019-05-20 MED ORDER — FUROSEMIDE 40 MG PO TABS: 40.0000 mg | ORAL_TABLET | Freq: Every day | ORAL | 0 refills | Status: DC

## 2019-05-20 MED ORDER — ESOMEPRAZOLE MAGNESIUM 40 MG PO CPDR: 40.0000 mg | DELAYED_RELEASE_CAPSULE | Freq: Two times a day (BID) | ORAL | 0 refills | Status: AC

## 2019-05-20 NOTE — Progress Notes (Signed)
Location:    [place] Room Number: 133/P Place of Service:  SNF (31)    CODE STATUS: Full Code  Allergies  Allergen Reactions  . Amoxicillin-Pot Clavulanate Itching and Swelling  . Clarithromycin Itching and Swelling  . Clindamycin Itching and Swelling  . Doxycycline Itching and Swelling  . Penicillins Itching and Swelling    Did it involve swelling of the face/tongue/throat, SOB, or low BP? Yes Did it involve sudden or severe rash/hives, skin peeling, or any reaction on the inside of your mouth or nose? No Did you need to seek medical attention at a hospital or doctor's office? Yes When did it last happen?Over 10 years If all above answers are "NO", may proceed with cephalosporin use.     Chief Complaint  Patient presents with  . Discharge Note    Discharge  Visit    HPI:  She is being discharged to home with home health for pt/ot. She will need a standard wheelchair and bsc. She will need her prescriptions written and will need to follow up with her medical provider. She was hospitalized after suffering a fall and having multiple fractures. She was admitted to this facility for short term rehab. She has participated in physical and occupation therapy and is ready to complete her therapy on a home health basis.    Past Medical History:  Diagnosis Date  . Asthma   . Atrial fibrillation ([place])   . Chronic rhinitis   . COPD (chronic obstructive pulmonary disease) ([place])   . GERD (gastroesophageal reflux disease)   . Mitral regurgitation   . Mitral valve prolapse   . Nasal polyps   . Nonischemic cardiomyopathy ([facility])    LVEF 25-30% improved to 45-50%  . Obstructive sleep apnea   . Transudative pleural effusion     Past Surgical History:  Procedure Laterality Date  . APPENDECTOMY  2000  . Bilateral cataract surgery    . LAPAROSCOPIC NISSEN FUNDOPLICATION  1610  . NOSE SURGERY      Social History   Socioeconomic History  . Marital  status: Single    Spouse name: Not on file  . Number of children: Not on file  . Years of education: Not on file  . Highest education level: Not on file  Occupational History  . Not on file  Social Needs  . Financial resource strain: Not on file  . Food insecurity    Worry: Not on file    Inability: Not on file  . Transportation needs    Medical: Not on file    Non-medical: Not on file  Tobacco Use  . Smoking status: Former Smoker    Packs/day: 0.50    Years: 20.00    Pack years: 10.00    Types: Cigarettes    Quit date: 10/08/1980    Years since quitting: 38.6  . Smokeless tobacco: Never Used  Substance and Sexual Activity  . Alcohol use: No    Alcohol/week: 0.0 standard drinks  . Drug use: No  . Sexual activity: Not on file  Lifestyle  . Physical activity    Days per week: Not on file    Minutes per session: Not on file  . Stress: Not on file  Relationships  . Social Herbalist on phone: Not on file    Gets together: Not on file    Attends religious service: Not on file    Active member of club or organization: Not on file  Attends meetings of clubs or organizations: Not on file    Relationship status: Not on file  . Intimate partner violence    Fear of current or ex partner: Not on file    Emotionally abused: Not on file    Physically abused: Not on file    Forced sexual activity: Not on file  Other Topics Concern  . Not on file  Social History Narrative   Former smoker, quit in 1982.  Nondrinker.  Mother and father both had colon cancer.    Family History  Problem Relation Age of Onset  . Cancer Mother        Colon  . Cancer Father        Colon  . Atrial fibrillation Sister   . Cancer Brother        Lung  . Cancer Brother        Lung    VITAL SIGNS BP (!) 96/58   Pulse 76   Temp 97.8 F (36.6 C) (Oral)   Resp 17   Ht 5\' 6"  (1.676 m)   Wt 145 lb 6.4 oz (66 kg)   BMI 23.47 kg/m   Patient's Medications  New Prescriptions   No  medications on file  Previous Medications   ACETAMINOPHEN (TYLENOL) 325 MG TABLET    Take 2 tablets (650 mg total) by mouth every 6 (six) hours as needed for mild pain, fever or headache.   ALBUTEROL (PROVENTIL HFA;VENTOLIN HFA) 108 (90 BASE) MCG/ACT INHALER    Inhale 2 puffs into the lungs every 6 (six) hours as needed for wheezing or shortness of breath.   ALBUTEROL (PROVENTIL) (2.5 MG/3ML) 0.083% NEBULIZER SOLUTION    INHALE THE CONTENTS OF 1 VIAL USING NEBULIZER EVERY 4 HOURS AS NEEDED FOR WHEEZING OR SHORTNESS OF BREATH   ATORVASTATIN (LIPITOR) 10 MG TABLET    Take 10 mg by mouth daily.     BISACODYL (DULCOLAX) 10 MG SUPPOSITORY    Place 1 suppository (10 mg total) rectally daily as needed for moderate constipation.   DIGOXIN (LANOXIN) 0.125 MG TABLET    Take 0.125 mg by mouth daily. Hold if apical pulse is less than 60   DILTIAZEM (CARDIZEM CD) 240 MG 24 HR CAPSULE    Take 240 mg by mouth daily.   DOCUSATE SODIUM (COLACE) 100 MG CAPSULE    Take 100 mg by mouth 2 (two) times daily.   ESCITALOPRAM (LEXAPRO) 10 MG TABLET    Take 10 mg by mouth every morning.   ESOMEPRAZOLE (NEXIUM) 40 MG CAPSULE    Take 40 mg by mouth 2 (two) times daily.     FUROSEMIDE (LASIX) 40 MG TABLET    Take 40 mg by mouth daily.   NON FORMULARY    Diet- Regular   PHENYLEPHRINE-SHARK LIVER OIL-MINERAL OIL-PETROLATUM (PREPARATION H) 0.25-3-14-71.9 % RECTAL OINTMENT    Place 1 application rectally 2 (two) times daily as needed for hemorrhoids.   POLYETHYLENE GLYCOL (MIRALAX / GLYCOLAX) 17 G PACKET    Take 17 g by mouth daily.   POTASSIUM CHLORIDE SA (K-DUR) 20 MEQ TABLET    Take 20 mEq by mouth daily.   TRAMADOL (ULTRAM) 50 MG TABLET    Take 1 tablet (50 mg total) by mouth every 6 (six) hours as needed.   WARFARIN (COUMADIN) 5 MG TABLET    Take 5 mg by mouth. Once A Day on Mon, Tue, Wed, Fri   WARFARIN (COUMADIN) 6 MG TABLET    Take 6 mg  by mouth. Once A Day on Sun, Thu, Sat  Modified Medications   No medications on file   Discontinued Medications   No medications on file     SIGNIFICANT DIAGNOSTIC EXAMS   PREVIOUS;   04-23-19: ct of abdomen and pelvis:  1. Comminuted fracture of the right acetabulum with a moderate sized surrounding pelvic hematoma. There is a nondisplaced fracture of the right inferior pubic ramus. 2. Dilated loops of small bowel without evidence for a distinct transition point. Findings may represent ileus or partial small bowel obstruction. 3. Significant cardiomegaly. 4. Large amount of stool throughout the colon. 5. Mildly dilated common bile duct and pancreatic duct without evidence for discrete pancreatic mass. Follow-up with a nonemergent outpatient MRCP is recommended for further evaluation of this finding.  04-23-19: ct of right hip:  1. Acute comminuted fracture of the right acetabulum as detailed above. Nondisplaced fracture of the inferior pubic ramus on the right. 2. Moderate size right-sided pelvic hematoma.  04-23-19: ct of head cervical spine and maxillofacial:  1. 4 mm right lateral subdural hematoma and 4 mm of midline shift to the left. 2. Mildly comminuted and minimally displaced right lateral orbital wall fracture. 3. Right periorbital hematoma. 4. No skull fracture. 5. No cervical spine fracture or subluxation. 6. Multilevel cervical spine degenerative changes. 7. Mild diffuse cerebral and cerebellar atrophy and mild chronic small vessel white matter ischemic changes in both cerebral hemispheres. 8. Bilateral carotid artery atheromatous calcifications.   04-24-19: right hip and pelvic x-ray: Right acetabular and pubic rami fractures.   04-24-19: ct of head: Redistribution of small right-sided subdural hematoma, now layering along the right leaflet of the tentorium cerebelli.  04-24-19: pelvic x-ray: Unchanged alignment of comminuted right acetabular fracture.  04-25-19: right hand x-ray: Minimally displaced fracture involving proximal base of third proximal  phalanx.  04-28-19: ct of head: Unchanged small volume right-sided subdural hemorrhage. No mass effect.  NO NEW EXAMS   LABS REVIEWED PREVIOUS;   04-23-19: wbc 12.0; hgb 12.6; hct 40.1; mcv 89.5; plt 122; glucose 141; bun 22; creat 1.22; k+ 4.0; na++ 139; ca 8.8; liver normal albumin 3.4; INR 2.7  04-26-19: wbc 6.2; hgb 11.4; hct 34.9; mcv 89.0; plt 81; glucose 151; bun 12; creat 1.10 ;k+ 4.5; na++ 136; ca 8.4 05-02-19: wbc 5.9; hgb 10.5; hct 32.2; mcv 88.7; plt 155 05-03-19: wbc 6.3; hgb 10.7; hct 32.5; mcv 88.1; plt 171 05-04-19; INR 1.5  05-17-19: INR 1.7  NO NEW LABS.    Review of Systems  Constitutional: Negative for malaise/fatigue.  Respiratory: Negative for cough and shortness of breath.   Cardiovascular: Negative for chest pain, palpitations and leg swelling.  Gastrointestinal: Negative for abdominal pain, constipation and heartburn.  Musculoskeletal: Negative for back pain, joint pain and myalgias.  Skin: Negative.   Neurological: Negative for dizziness.  Psychiatric/Behavioral: The patient is not nervous/anxious.     Physical Exam Constitutional:      General: She is not in acute distress.    Appearance: She is well-developed. She is not diaphoretic.  Eyes:     Comments: History of bilateral cataract removal      Neck:     Musculoskeletal: Neck supple.     Thyroid: No thyromegaly.  Cardiovascular:     Rate and Rhythm: Normal rate and regular rhythm.     Pulses: Normal pulses.     Heart sounds: Normal heart sounds.  Pulmonary:     Effort: Pulmonary effort is normal. No respiratory distress.  Breath sounds: Normal breath sounds.  Abdominal:     General: Bowel sounds are normal. There is no distension.     Palpations: Abdomen is soft.     Tenderness: There is no abdominal tenderness.  Musculoskeletal:     Right lower leg: No edema.     Left lower leg: No edema.     Comments:  Status post right acetabular fracture Right orbital wall fracture Right inferior rami  fracture  Right middle finger fracture Is able to move all extremities        Lymphadenopathy:     Cervical: No cervical adenopathy.  Skin:    General: Skin is warm and dry.  Neurological:     Mental Status: She is alert and oriented to person, place, and time.  Psychiatric:        Mood and Affect: Mood normal.     ASSESSMENT/ PLAN:  Patient is being discharged with the following home health services:  Pt/ot: to evaluate and treat as indicated for gait balance strength adl training  Patient is being discharged with the following durable medical equipment:  Bsc. Standard wheelchair with elevated leg rests; cushion; anti-tippers; brake extensions to allow her to maintain her current level of independence with her adls which cannot be achieved with a walker.   Patient has been advised to f/u with their PCP in 1-2 weeks to bring them up to date on their rehab stay.  Social services at facility was responsible for arranging this appointment.  Pt was provided with a 30 day supply of prescriptions for medications and refills must be obtained from their PCP.  For controlled substances, a more limited supply may be provided adequate until PCP appointment only.  A 30 day supply of her prescription medications have been sent to [facility] [address]   Time spent with patient: 40 minutes: dme; home health prescriptions.    Ok Edwards NP [facility]  Contact [phone] Monday through Friday 8am- 5pm  After hours call [phone]

## 2019-05-21 NOTE — Progress Notes (Signed)
Location:   [facility]    Place of Service:  [facility] (31)   CODE STATUS: full code   Allergies  Allergen Reactions  . Amoxicillin-Pot Clavulanate Itching and Swelling  . Clarithromycin Itching and Swelling  . Clindamycin Itching and Swelling  . Doxycycline Itching and Swelling  . Penicillins Itching and Swelling    Did it involve swelling of the face/tongue/throat, SOB, or low BP? Yes Did it involve sudden or severe rash/hives, skin peeling, or any reaction on the inside of your mouth or nose? No Did you need to seek medical attention at a hospital or doctor's office? Yes When did it last happen?Over 10 years If all above answers are "NO", may proceed with cephalosporin use.     Chief Complaint  Patient presents with  . Acute Visit    care plan meeting     HPI:  We have come together for her care plan meeting. BIMS 15/15 mood 2/30. She continues to participate in therapy and continues to improve. She has not had any recent falls. Her weight is stable; appetite is good. She does take ultram as needed pain and has taken this medication long term. Her goal remains to go home; more than likely she will be discharged to home within the next week.    Past Medical History:  Diagnosis Date  . Asthma   . Atrial fibrillation ([place])   . Chronic rhinitis   . COPD (chronic obstructive pulmonary disease) ([place])   . GERD (gastroesophageal reflux disease)   . Mitral regurgitation   . Mitral valve prolapse   . Nasal polyps   . Nonischemic cardiomyopathy ([facility])    LVEF 25-30% improved to 45-50%  . Obstructive sleep apnea   . Transudative pleural effusion     Past Surgical History:  Procedure Laterality Date  . APPENDECTOMY  2000  . Bilateral cataract surgery    . LAPAROSCOPIC NISSEN FUNDOPLICATION  6378  . NOSE SURGERY      Social History   Socioeconomic History  . Marital status: Single    Spouse name: Not on file  . Number of children: Not on file  . Years of education: Not  on file  . Highest education level: Not on file  Occupational History  . Not on file  Social Needs  . Financial resource strain: Not on file  . Food insecurity    Worry: Not on file    Inability: Not on file  . Transportation needs    Medical: Not on file    Non-medical: Not on file  Tobacco Use  . Smoking status: Former Smoker    Packs/day: 0.50    Years: 20.00    Pack years: 10.00    Types: Cigarettes    Quit date: 10/08/1980    Years since quitting: 38.6  . Smokeless tobacco: Never Used  Substance and Sexual Activity  . Alcohol use: No    Alcohol/week: 0.0 standard drinks  . Drug use: No  . Sexual activity: Not on file  Lifestyle  . Physical activity    Days per week: Not on file    Minutes per session: Not on file  . Stress: Not on file  Relationships  . Social Herbalist on phone: Not on file    Gets together: Not on file    Attends religious service: Not on file    Active member of club or organization: Not on file    Attends meetings of clubs or organizations:  Not on file    Relationship status: Not on file  . Intimate partner violence    Fear of current or ex partner: Not on file    Emotionally abused: Not on file    Physically abused: Not on file    Forced sexual activity: Not on file  Other Topics Concern  . Not on file  Social History Narrative   Former smoker, quit in 1982.  Nondrinker.  Mother and father both had colon cancer.    Family History  Problem Relation Age of Onset  . Cancer Mother        Colon  . Cancer Father        Colon  . Atrial fibrillation Sister   . Cancer Brother        Lung  . Cancer Brother        Lung      VITAL SIGNS BP 135/78   Pulse 80   Temp 98.1 F (36.7 C)   Ht 5\' 6"  (1.676 m)   Wt 145 lb (65.8 kg)   BMI 23.40 kg/m   No facility-administered encounter medications on file as of 05/17/2019.    Outpatient Encounter Medications as of 05/17/2019  Medication Sig  . acetaminophen (TYLENOL) 325 MG  tablet Take 2 tablets (650 mg total) by mouth every 6 (six) hours as needed for mild pain, fever or headache.  . bisacodyl (DULCOLAX) 10 MG suppository Place 1 suppository (10 mg total) rectally daily as needed for moderate constipation.  . docusate sodium (COLACE) 100 MG capsule Take 100 mg by mouth 2 (two) times daily.  . NON FORMULARY Diet- Regular  . phenylephrine-shark liver oil-mineral oil-petrolatum (PREPARATION H) 0.25-3-14-71.9 % rectal ointment Place 1 application rectally 2 (two) times daily as needed for hemorrhoids.  . polyethylene glycol (MIRALAX / GLYCOLAX) 17 g packet Take 17 g by mouth daily.  Marland Kitchen  albuterol (PROVENTIL HFA;VENTOLIN HFA) 108 (90 BASE) MCG/ACT inhaler Inhale 2 puffs into the lungs every 6 (six) hours as needed for wheezing or shortness of breath.  Marland Kitchen  atorvastatin (LIPITOR) 10 MG tablet Take 10 mg by mouth daily.    .  digoxin (LANOXIN) 0.125 MG tablet Take 0.125 mg by mouth daily. Hold if apical pulse is less than 60  . ] diltiazem (CARDIZEM CD) 240 MG 24 hr capsule Take 240 mg by mouth daily.  Marland Kitchen  escitalopram (LEXAPRO) 10 MG tablet Take 10 mg by mouth every morning.  Marland Kitchen  esomeprazole (NEXIUM) 40 MG capsule Take 40 mg by mouth 2 (two) times daily.    .  furosemide (LASIX) 40 MG tablet Take 40 mg by mouth daily.  .  potassium chloride SA (K-DUR) 20 MEQ tablet Take 20 mEq by mouth daily.  .  traMADol (ULTRAM) 50 MG tablet Take 1 tablet (50 mg total) by mouth every 6 (six) hours as needed.  .  warfarin (COUMADIN) 5 MG tablet Take 5 mg by mouth. Once A Day on Mon, Tue, Wed, Fri     SIGNIFICANT DIAGNOSTIC EXAMS   PREVIOUS;   04-23-19: ct of abdomen and pelvis:  1. Comminuted fracture of the right acetabulum with a moderate sized surrounding pelvic hematoma. There is a nondisplaced fracture of the right inferior pubic ramus. 2. Dilated loops of small bowel without evidence for a distinct transition point. Findings may represent ileus or partial small bowel  obstruction. 3. Significant cardiomegaly. 4. Large amount of stool throughout the colon. 5. Mildly dilated common bile duct and pancreatic duct  without evidence for discrete pancreatic mass. Follow-up with a nonemergent outpatient MRCP is recommended for further evaluation of this finding.  04-23-19: ct of right hip:  1. Acute comminuted fracture of the right acetabulum as detailed above. Nondisplaced fracture of the inferior pubic ramus on the right. 2. Moderate size right-sided pelvic hematoma.  04-23-19: ct of head cervical spine and maxillofacial:  1. 4 mm right lateral subdural hematoma and 4 mm of midline shift to the left. 2. Mildly comminuted and minimally displaced right lateral orbital wall fracture. 3. Right periorbital hematoma. 4. No skull fracture. 5. No cervical spine fracture or subluxation. 6. Multilevel cervical spine degenerative changes. 7. Mild diffuse cerebral and cerebellar atrophy and mild chronic small vessel white matter ischemic changes in both cerebral hemispheres. 8. Bilateral carotid artery atheromatous calcifications.   04-24-19: right hip and pelvic x-ray: Right acetabular and pubic rami fractures.   04-24-19: ct of head: Redistribution of small right-sided subdural hematoma, now layering along the right leaflet of the tentorium cerebelli.  04-24-19: pelvic x-ray: Unchanged alignment of comminuted right acetabular fracture.  04-25-19: right hand x-ray: Minimally displaced fracture involving proximal base of third proximal phalanx.  04-28-19: ct of head: Unchanged small volume right-sided subdural hemorrhage. No mass effect.  NO NEW EXAMS   LABS REVIEWED PREVIOUS;   04-23-19: wbc 12.0; hgb 12.6; hct 40.1; mcv 89.5; plt 122; glucose 141; bun 22; creat 1.22; k+ 4.0; na++ 139; ca 8.8; liver normal albumin 3.4; INR 2.7  04-26-19: wbc 6.2; hgb 11.4; hct 34.9; mcv 89.0; plt 81; glucose 151; bun 12; creat 1.10 ;k+ 4.5; na++ 136; ca 8.4 05-02-19: wbc 5.9; hgb 10.5;  hct 32.2; mcv 88.7; plt 155 05-03-19: wbc 6.3; hgb 10.7; hct 32.5; mcv 88.1; plt 171 05-04-19; INR 1.5  05-17-19: INR 1.7  NO NEW LABS.     Review of Systems  Constitutional: Negative for malaise/fatigue.  Respiratory: Negative for cough and shortness of breath.   Cardiovascular: Negative for chest pain, palpitations and leg swelling.  Gastrointestinal: Negative for abdominal pain, constipation and heartburn.  Musculoskeletal: Negative for back pain, joint pain and myalgias.  Skin: Negative.   Neurological: Negative for dizziness.  Psychiatric/Behavioral: The patient is not nervous/anxious.     Physical Exam Constitutional:      General: She is not in acute distress.    Appearance: She is well-developed. She is not diaphoretic.  Eyes:     Comments: History of bilateral cataract removal   Neck:     Musculoskeletal: Neck supple.     Thyroid: No thyromegaly.  Cardiovascular:     Rate and Rhythm: Normal rate and regular rhythm.     Pulses: Normal pulses.     Heart sounds: Normal heart sounds.  Pulmonary:     Effort: Pulmonary effort is normal. No respiratory distress.     Breath sounds: Normal breath sounds.  Abdominal:     General: Bowel sounds are normal. There is no distension.     Palpations: Abdomen is soft.     Tenderness: There is no abdominal tenderness.  Musculoskeletal:     Right lower leg: No edema.     Left lower leg: No edema.     Comments: Status post right acetabular fracture Right orbital wall fracture Right inferior rami fracture  Right middle finger fracture Is able to move all extremities   Lymphadenopathy:     Cervical: No cervical adenopathy.  Skin:    General: Skin is warm and dry.  Neurological:  Mental Status: She is alert and oriented to person, place, and time.  Psychiatric:        Mood and Affect: Mood normal.       ASSESSMENT/ PLAN:  TODAY;   1. Subdural hematoma 2. Closed fracture of orbital wall sequela  3. Closed nondisplaced  fracture of right acetabulum unspecified portion of acetabulum sequela  Will continue therapy as directed Will continue current medications Will continue to monitor her status.        MD is aware of resident's narcotic use and is in agreement with current plan of care. We will attempt to wean resident as appropriate.  Ok Edwards NP [facility]  Contact [phone] Monday through Friday 8am- 5pm  After hours call [phone]

## 2019-05-27 DIAGNOSIS — E119 Type 2 diabetes mellitus without complications: Secondary | ICD-10-CM | POA: Diagnosis not present

## 2019-05-27 DIAGNOSIS — S72002S Fracture of unspecified part of neck of left femur, sequela: Secondary | ICD-10-CM | POA: Diagnosis not present

## 2019-05-27 DIAGNOSIS — I4891 Unspecified atrial fibrillation: Secondary | ICD-10-CM | POA: Diagnosis not present

## 2019-05-27 DIAGNOSIS — N183 Chronic kidney disease, stage 3 (moderate): Secondary | ICD-10-CM | POA: Diagnosis not present

## 2019-05-27 DIAGNOSIS — I509 Heart failure, unspecified: Secondary | ICD-10-CM | POA: Diagnosis not present

## 2019-05-27 DIAGNOSIS — J4540 Moderate persistent asthma, uncomplicated: Secondary | ICD-10-CM | POA: Diagnosis not present

## 2019-05-27 DIAGNOSIS — Z681 Body mass index (BMI) 19 or less, adult: Secondary | ICD-10-CM | POA: Diagnosis not present

## 2019-05-27 NOTE — Progress Notes (Signed)
This encounter was created in error - please disregard.

## 2019-05-27 NOTE — Addendum Note (Signed)
Addended by: Gerlene Fee on: 05/27/2019 10:58 AM   Modules accepted: Level of Service, SmartSet

## 2019-05-30 ENCOUNTER — Other Ambulatory Visit: Payer: Self-pay | Admitting: Adult Health

## 2019-06-06 ENCOUNTER — Other Ambulatory Visit: Payer: Self-pay | Admitting: Adult Health

## 2019-06-07 DIAGNOSIS — S32491A Other specified fracture of right acetabulum, initial encounter for closed fracture: Secondary | ICD-10-CM | POA: Diagnosis not present

## 2019-06-07 DIAGNOSIS — S62602D Fracture of unspecified phalanx of right middle finger, subsequent encounter for fracture with routine healing: Secondary | ICD-10-CM | POA: Diagnosis not present

## 2019-06-09 ENCOUNTER — Other Ambulatory Visit: Payer: Self-pay | Admitting: Adult Health

## 2019-06-19 DIAGNOSIS — S32591A Other specified fracture of right pubis, initial encounter for closed fracture: Secondary | ICD-10-CM | POA: Diagnosis not present

## 2019-06-22 ENCOUNTER — Other Ambulatory Visit: Payer: Self-pay

## 2019-06-22 ENCOUNTER — Ambulatory Visit (INDEPENDENT_AMBULATORY_CARE_PROVIDER_SITE_OTHER): Payer: Medicare PPO | Admitting: *Deleted

## 2019-06-22 DIAGNOSIS — I4891 Unspecified atrial fibrillation: Secondary | ICD-10-CM

## 2019-06-22 DIAGNOSIS — Z5181 Encounter for therapeutic drug level monitoring: Secondary | ICD-10-CM

## 2019-06-22 LAB — POCT INR: INR: 1.2 — AB (ref 2.0–3.0)

## 2019-06-22 NOTE — Patient Instructions (Signed)
Was in [facility] for rehab after a fall.  D/C three weeks ago.  Has been taking warfarin 5mg  daily since discharge. Restart 1 1/2 tablets daily except 1 tablet on Sundays, Tuesdays and Thursdays Recheck in 2 weeks

## 2019-07-14 ENCOUNTER — Ambulatory Visit (INDEPENDENT_AMBULATORY_CARE_PROVIDER_SITE_OTHER): Payer: Medicare PPO | Admitting: *Deleted

## 2019-07-14 ENCOUNTER — Other Ambulatory Visit: Payer: Self-pay

## 2019-07-14 DIAGNOSIS — Z5181 Encounter for therapeutic drug level monitoring: Secondary | ICD-10-CM

## 2019-07-14 DIAGNOSIS — I482 Chronic atrial fibrillation, unspecified: Secondary | ICD-10-CM | POA: Diagnosis not present

## 2019-07-14 LAB — POCT INR: INR: 1.7 — AB (ref 2.0–3.0)

## 2019-07-14 NOTE — Patient Instructions (Signed)
Take 2 tablets tonight then increase dose to 1 1/2 tablets daily except 1 tablet on Sundays and Wednesdays Recheck in 3 weeks

## 2019-07-20 DIAGNOSIS — S32591A Other specified fracture of right pubis, initial encounter for closed fracture: Secondary | ICD-10-CM | POA: Diagnosis not present

## 2019-08-03 ENCOUNTER — Other Ambulatory Visit: Payer: Self-pay

## 2019-08-03 ENCOUNTER — Ambulatory Visit (INDEPENDENT_AMBULATORY_CARE_PROVIDER_SITE_OTHER): Payer: Medicare PPO | Admitting: *Deleted

## 2019-08-03 DIAGNOSIS — I482 Chronic atrial fibrillation, unspecified: Secondary | ICD-10-CM

## 2019-08-03 DIAGNOSIS — Z5181 Encounter for therapeutic drug level monitoring: Secondary | ICD-10-CM

## 2019-08-03 LAB — POCT INR: INR: 1.1 — AB (ref 2.0–3.0)

## 2019-08-03 NOTE — Patient Instructions (Signed)
Take 2 tablets tonight and tomorrow night then resume 1 1/2 tablets daily except 1 tablet on Sundays and Wednesdays Recheck in 1 week

## 2019-08-15 ENCOUNTER — Other Ambulatory Visit: Payer: Self-pay | Admitting: Internal Medicine

## 2019-08-17 ENCOUNTER — Other Ambulatory Visit: Payer: Self-pay | Admitting: Internal Medicine

## 2019-08-17 NOTE — Telephone Encounter (Signed)
Dr. Annamaria Boots, please advise if it is okay to refill med for pt.  Allergies  Allergen Reactions  . Amoxicillin-Pot Clavulanate Itching and Swelling  . Clarithromycin Itching and Swelling  . Clindamycin Itching and Swelling  . Doxycycline Itching and Swelling  . Penicillins Itching and Swelling    Did it involve swelling of the face/tongue/throat, SOB, or low BP? Yes Did it involve sudden or severe rash/hives, skin peeling, or any reaction on the inside of your mouth or nose? No Did you need to seek medical attention at a hospital or doctor's office? Yes When did it last happen?Over 10 years If all above answers are "NO", may proceed with cephalosporin use.      Current Outpatient Medications:  .  acetaminophen (TYLENOL) 325 MG tablet, Take 2 tablets (650 mg total) by mouth every 6 (six) hours as needed for mild pain, fever or headache., Disp:  , Rfl:  .  albuterol (VENTOLIN HFA) 108 (90 Base) MCG/ACT inhaler, Inhale 2 puffs into the lungs every 6 (six) hours as needed for wheezing or shortness of breath., Disp: 6.7 g, Rfl: 0 .  atorvastatin (LIPITOR) 10 MG tablet, Take 1 tablet (10 mg total) by mouth daily., Disp: 30 tablet, Rfl: 0 .  bisacodyl (DULCOLAX) 10 MG suppository, Place 1 suppository (10 mg total) rectally daily as needed for moderate constipation., Disp: 12 suppository, Rfl: 0 .  digoxin (LANOXIN) 0.125 MG tablet, Take 1 tablet (0.125 mg total) by mouth daily. Hold if apical pulse is less than 60, Disp: 30 tablet, Rfl: 0 .  diltiazem (CARDIZEM CD) 240 MG 24 hr capsule, Take 1 capsule (240 mg total) by mouth daily., Disp: 30 capsule, Rfl: 0 .  docusate sodium (COLACE) 100 MG capsule, Take 100 mg by mouth 2 (two) times daily., Disp: , Rfl:  .  escitalopram (LEXAPRO) 10 MG tablet, Take 1 tablet (10 mg total) by mouth every morning., Disp: 30 tablet, Rfl: 0 .  esomeprazole (NEXIUM) 40 MG capsule, Take 1 capsule (40 mg total) by mouth 2 (two) times daily., Disp: 60 capsule, Rfl:  0 .  furosemide (LASIX) 40 MG tablet, Take 1 tablet (40 mg total) by mouth daily., Disp: 30 tablet, Rfl: 0 .  NON FORMULARY, Diet- Regular, Disp: , Rfl:  .  phenylephrine-shark liver oil-mineral oil-petrolatum (PREPARATION H) 0.25-3-14-71.9 % rectal ointment, Place 1 application rectally 2 (two) times daily as needed for hemorrhoids., Disp: , Rfl:  .  polyethylene glycol (MIRALAX / GLYCOLAX) 17 g packet, Take 17 g by mouth daily., Disp: 14 each, Rfl: 0 .  potassium chloride SA (K-DUR) 20 MEQ tablet, Take 1 tablet (20 mEq total) by mouth daily., Disp: 30 tablet, Rfl: 0 .  traMADol (ULTRAM) 50 MG tablet, Take 1 tablet (50 mg total) by mouth every 6 (six) hours as needed., Disp: 10 tablet, Rfl: 0 .  warfarin (COUMADIN) 5 MG tablet, Take 1 tablet (5 mg total) by mouth daily at 6 PM. Once A Day on Mon, Tue, Wed, Fri, Disp: 30 tablet, Rfl: 0 .  warfarin (COUMADIN) 6 MG tablet, Take 1 tablet (6 mg total) by mouth daily at 6 PM. Once A Day on Sun, Thu, Sat, Disp: 30 tablet, Rfl: 0  Current Facility-Administered Medications:  .  omalizumab Arvid Right) injection 300 mg, 300 mg, Subcutaneous, Q14 Days, Young, Clinton D, MD, 300 mg at 08/04/16 1221

## 2019-08-17 NOTE — Telephone Encounter (Signed)
Requesting Tramadol refill. According to what I can see, she still has 2 refills left on current script. She can contact us when she is on the last refill of her current Tramadol prescription.

## 2019-08-19 DIAGNOSIS — S32591A Other specified fracture of right pubis, initial encounter for closed fracture: Secondary | ICD-10-CM | POA: Diagnosis not present

## 2019-08-22 ENCOUNTER — Telehealth (INDEPENDENT_AMBULATORY_CARE_PROVIDER_SITE_OTHER): Payer: Medicare PPO | Admitting: Cardiology

## 2019-08-22 ENCOUNTER — Encounter: Payer: Self-pay | Admitting: Cardiology

## 2019-08-22 DIAGNOSIS — I38 Endocarditis, valve unspecified: Secondary | ICD-10-CM | POA: Diagnosis not present

## 2019-08-22 DIAGNOSIS — I4821 Permanent atrial fibrillation: Secondary | ICD-10-CM

## 2019-08-22 DIAGNOSIS — Z8679 Personal history of other diseases of the circulatory system: Secondary | ICD-10-CM

## 2019-08-22 DIAGNOSIS — R600 Localized edema: Secondary | ICD-10-CM | POA: Diagnosis not present

## 2019-08-22 NOTE — Patient Instructions (Signed)
Medication Instructions:  Your physician recommends that you continue on your current medications as directed. Please refer to the Current Medication list given to you today.   Labwork: Please have INR checked in [place] soon   Testing/Procedures: None  Follow-Up: Your physician wants you to follow-up in: 6 months.  You will receive a reminder letter in the mail two months in advance. If you don't receive a letter, please call our office to schedule the follow-up appointment.   Any Other Special Instructions Will Be Listed Below (If Applicable).     If you need a refill on your cardiac medications before your next appointment, please call your pharmacy.

## 2019-08-22 NOTE — Progress Notes (Signed)
Virtual Visit via Telephone Note   This visit type was conducted due to national recommendations for restrictions regarding the COVID-19 Pandemic (e.g. social distancing) in an effort to limit this patient's exposure and mitigate transmission in our community.  Due to her co-morbid illnesses, this patient is at least at moderate risk for complications without adequate follow up.  This format is felt to be most appropriate for this patient at this time.  The patient did not have access to video technology/had technical difficulties with video requiring transitioning to audio format only (telephone).  All issues noted in this document were discussed and addressed.  No physical exam could be performed with this format.  Please refer to the patient's chart for her  consent to telehealth for [facility].   Date:  08/22/2019   ID:  [name], DOB [birth year], MRN [id number]  Patient Location: Home Provider Location: Office  PCP:  Sharilyn Sites, MD  Cardiologist:  [name], MD Electrophysiologist:  None   Evaluation Performed:  Follow-Up Visit  Chief Complaint:  Cardiac follow-up  History of Present Illness:    [name] is an 85 y.o. female last seen in December 2019.  She tells me that she has been doing reasonably well, staying at home a lot during the pandemic but does wear a mask when she goes out.  I reviewed the chart, she had a fall back in August resulting in right acetabular fracture and also a very small subdural hematoma.  She did not require surgery and had a rehabilitation stay at the [facility] following discharge.  She remains on Coumadin with follow-up in the anticoagulation clinic, although missed her most recent visit and has been recently subtherapeutic.  We discussed getting a follow-up scheduled this week.  Recent follow-up echocardiogram in July showed LVEF 55 to 60%, severe biatrial enlargement, calcified aortic valve with mild aortic regurgitation,  calcified mitral valve with mild to moderate mitral regurgitation and moderate mitral stenosis with mean gradient 7 mmHg.  She does report intermittent right leg swelling, remains on Lasix.  Heart rate well controlled today on combination of Cardizem CD and Lanoxin.  The patient does not have symptoms concerning for COVID-19 infection (fever, chills, cough, or new shortness of breath).    Past Medical History:  Diagnosis Date  . Asthma   . Atrial fibrillation ([place])   . Chronic rhinitis   . COPD (chronic obstructive pulmonary disease) ([place])   . GERD (gastroesophageal reflux disease)   . Mitral regurgitation   . Mitral valve prolapse   . Nasal polyps   . Nonischemic cardiomyopathy (HCC)    LVEF 25-30% improved to 45-50%  . Obstructive sleep apnea   . Transudative pleural effusion    Past Surgical History:  Procedure Laterality Date  . APPENDECTOMY  2000  . Bilateral cataract surgery    . LAPAROSCOPIC NISSEN FUNDOPLICATION  0962  . NOSE SURGERY       Current Meds  Medication Sig  . acetaminophen (TYLENOL) 325 MG tablet Take 2 tablets (650 mg total) by mouth every 6 (six) hours as needed for mild pain, fever or headache.  . albuterol (VENTOLIN HFA) 108 (90 Base) MCG/ACT inhaler Inhale 2 puffs into the lungs every 6 (six) hours as needed for wheezing or shortness of breath.  Marland Kitchen atorvastatin (LIPITOR) 10 MG tablet Take 1 tablet (10 mg total) by mouth daily.  . bisacodyl (DULCOLAX) 10 MG suppository Place 1 suppository (10 mg total) rectally daily as needed  for moderate constipation.  . digoxin (LANOXIN) 0.125 MG tablet Take 1 tablet (0.125 mg total) by mouth daily. Hold if apical pulse is less than 60  . diltiazem (CARDIZEM CD) 240 MG 24 hr capsule Take 1 capsule (240 mg total) by mouth daily.  Marland Kitchen docusate sodium (COLACE) 100 MG capsule Take 100 mg by mouth 2 (two) times daily.  Marland Kitchen escitalopram (LEXAPRO) 10 MG tablet Take 1 tablet (10 mg total) by mouth every morning.  Marland Kitchen esomeprazole  (NEXIUM) 40 MG capsule Take 1 capsule (40 mg total) by mouth 2 (two) times daily.  . furosemide (LASIX) 40 MG tablet Take 1 tablet (40 mg total) by mouth daily.  . NON FORMULARY Diet- Regular  . phenylephrine-shark liver oil-mineral oil-petrolatum (PREPARATION H) 0.25-3-14-71.9 % rectal ointment Place 1 application rectally 2 (two) times daily as needed for hemorrhoids.  . polyethylene glycol (MIRALAX / GLYCOLAX) 17 g packet Take 17 g by mouth daily.  . potassium chloride SA (K-DUR) 20 MEQ tablet Take 1 tablet (20 mEq total) by mouth daily.  . traMADol (ULTRAM) 50 MG tablet Take 1 tablet (50 mg total) by mouth every 6 (six) hours as needed.  . warfarin (COUMADIN) 5 MG tablet Take 1 tablet (5 mg total) by mouth daily at 6 PM. Once A Day on Mon, Tue, Wed, Fri  . warfarin (COUMADIN) 6 MG tablet Take 1 tablet (6 mg total) by mouth daily at 6 PM. Once A Day on Sun, Thu, Sat   Current Facility-Administered Medications for the 08/22/19 encounter (Telemedicine) with Satira Sark, MD  Medication  . omalizumab Arvid Right) injection 300 mg     Allergies:   Amoxicillin-pot clavulanate, Clarithromycin, Clindamycin, Doxycycline, and Penicillins   Social History   Tobacco Use  . Smoking status: Former Smoker    Packs/day: 0.50    Years: 20.00    Pack years: 10.00    Types: Cigarettes    Quit date: 10/08/1980    Years since quitting: 38.8  . Smokeless tobacco: Never Used  Substance Use Topics  . Alcohol use: No    Alcohol/week: 0.0 standard drinks  . Drug use: No     Family Hx: The patient's family history includes Atrial fibrillation in her sister; Colon cancer in her father and mother; Lung cancer in her brother and brother.  ROS:   Please see the history of present illness.    Chronic hoarseness.  Seasonal allergies. All other systems reviewed and are negative.   Prior CV studies:   The following studies were reviewed today:  Echocardiogram 03/29/2019:  1. The left ventricle has  normal systolic function, with an ejection fraction of 55-60%. The cavity size was normal. There is mildly increased left ventricular wall thickness. Left ventricular diastolic Doppler parameters are indeterminate.  2. The right ventricle has normal systolic function. The cavity was normal. There is no increase in right ventricular wall thickness.  3. Left atrial size was severely dilated.  4. Right atrial size was severely dilated.  5. The aortic valve is tricuspid. Mild thickening of the aortic valve. Mild calcification of the aortic valve. Aortic valve regurgitation is mild by color flow Doppler. No stenosis of the aortic valve. Mild aortic annular calcification noted.  6. The mitral valve is abnormal. Mild thickening of the mitral valve leaflet. Mild calcification of the mitral valve leaflet. There is mild mitral annular calcification present. Mitral valve regurgitation is mild to moderate by color flow Doppler.  Mitral stenosis is moderate by mean gradient of  7 mmHg (HR 80 bpm).  7. The aortic root is normal in size and structure.  8. Pulmonary hypertension is indeterminant, inadequate TR jet.  Labs/Other Tests and Data Reviewed:    EKG:  An ECG dated 05/22/2018 was personally reviewed today and demonstrated:  Atrial fibrillation with borderline low voltage and nonspecific ST changes.  Recent Labs: 04/24/2019: ALT 20 04/26/2019: BUN 12; Creatinine, Ser 1.10; Potassium 4.5; Sodium 136 05/03/2019: Hemoglobin 10.7; Platelets 171    Wt Readings from Last 3 Encounters:  08/22/19 148 lb (67.1 kg)  05/20/19 145 lb 6.4 oz (66 kg)  05/18/19 145 lb 6.4 oz (66 kg)     Objective:    Vital Signs:  Pulse 74   Ht 5\' 6"  (1.676 m)   Wt 148 lb (67.1 kg)   BMI 23.89 kg/m    Patient spoke in complete sentences, not obviously short of breath at rest. No audible wheezing or coughing.  ASSESSMENT & PLAN:    1.  Permanent atrial fibrillation, heart rate 74 today on combination of Cardizem CD and  Lanoxin.  She does not report any palpitations.  Continue Coumadin with follow-up in the anticoagulation clinic, overdue for recheck which will be arranged.  2.  Valvular heart disease including mild aortic regurgitation, moderate mitral stenosis with mild mitral regurgitation.  Echocardiogram from July is outlined above.  Continue conservative management.  3.  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  Echocardiogram from July revealed LVEF 55 to 60% range.  4.  Recurring leg edema, continues on Lasix 40 mg daily.  Could use an extra half tablet as needed as needed.  COVID-19 Education: The signs and symptoms of COVID-19 were discussed with the patient and how to seek care for testing (follow up with PCP or arrange E-visit).  The importance of social distancing was discussed today.  Time:   Today, I have spent 8 minutes with the patient with telehealth technology discussing the above problems.     Medication Adjustments/Labs and Tests Ordered: Current medicines are reviewed at length with the patient today.  Concerns regarding medicines are outlined above.   Tests Ordered: No orders of the defined types were placed in this encounter.   Medication Changes: No orders of the defined types were placed in this encounter.   Follow Up:  In Person 6 months in the [place] office.  Signed, [name], MD  08/22/2019 2:12 PM    [facility]

## 2019-08-27 ENCOUNTER — Other Ambulatory Visit: Payer: Self-pay | Admitting: Adult Health

## 2019-08-31 ENCOUNTER — Ambulatory Visit (INDEPENDENT_AMBULATORY_CARE_PROVIDER_SITE_OTHER): Payer: Medicare PPO | Admitting: *Deleted

## 2019-08-31 ENCOUNTER — Other Ambulatory Visit: Payer: Self-pay

## 2019-08-31 DIAGNOSIS — I482 Chronic atrial fibrillation, unspecified: Secondary | ICD-10-CM | POA: Diagnosis not present

## 2019-08-31 DIAGNOSIS — Z5181 Encounter for therapeutic drug level monitoring: Secondary | ICD-10-CM

## 2019-08-31 LAB — POCT INR: INR: 2 (ref 2.0–3.0)

## 2019-08-31 NOTE — Patient Instructions (Signed)
Continue warfarin 1 1/2 tablets daily except 1 tablet on Sundays and Wednesdays Recheck in 2 weeks

## 2019-09-05 ENCOUNTER — Encounter: Payer: Self-pay | Admitting: Internal Medicine

## 2019-09-05 ENCOUNTER — Other Ambulatory Visit: Payer: Self-pay

## 2019-09-05 ENCOUNTER — Ambulatory Visit (INDEPENDENT_AMBULATORY_CARE_PROVIDER_SITE_OTHER): Payer: Medicare PPO | Admitting: Internal Medicine

## 2019-09-05 DIAGNOSIS — J449 Chronic obstructive pulmonary disease, unspecified: Secondary | ICD-10-CM | POA: Diagnosis not present

## 2019-09-05 DIAGNOSIS — I4891 Unspecified atrial fibrillation: Secondary | ICD-10-CM

## 2019-09-05 MED ORDER — TRAMADOL HCL 50 MG PO TABS: ORAL_TABLET | ORAL | 0 refills | Status: DC

## 2019-09-05 NOTE — Progress Notes (Signed)
Patient ID: [name], female    DOB: [birth year], 85 y.o.   MRN: [id number]  HPI F former smoker, followed for Chronic asthma/ COPD/ Xolair, chronic rhinosinusitis, complicated by hx AFib/ anticoagulation, CHF, cardiomyopathy, mitral insufficiency, GERD, CAD Echocardiogram-atrial fibrillation, LVEF 30-35%, diffuse hypokinesis  ---------------------------------------------------------------------------------------------------------   01/07/2019- 85 year old female former smoker followed for chronic asthma/COPD, chronic rhinosinusitis, lung nodules complicated by history A. fib/anticoagulation, CHF, cardiomyopathy, mitral insufficiency, GERD Barrett's, CAD Xolair- stopped by patient in 2018 when there was ? of throat cancer .-----breathing at baseline, states she's having "bad allergies", nasal congestion w/ white mucus She remains hoarse but says it has been better until recently and blames "allergy" this spring.  Zyrtec made her too sleepy.  Not much postnasal drainage.  She denies any wheeze or asthma flare as she remains off of Xolair.  Infrequent use of rescue inhaler. CXR 06/18/18 Resolution of previously seen right basilar infiltrate.  09/05/2019- Virtual Visit via Telephone Note  I connected with [name] on 09/05/19 at  4:00 PM EST by telephone and verified that I am speaking with the correct person using two identifiers.  Location: Patient: H Provider: O   I discussed the limitations, risks, security and privacy concerns of performing an evaluation and management service by telephone and the availability of in person appointments. I also discussed with the patient that there may be a patient responsible charge related to this service. The patient expressed understanding and agreed to proceed.   History of Present Illness: 85 year old female former smoker followed for chronic asthma/COPD, chronic rhinosinusitis, lung nodules complicated by history A.  fib/anticoagulation, CHF, cardiomyopathy, mitral insufficiency, GERD Barrett's, CAD Xolair- stopped by patient in 2018 when there was ? of throat cancer [place] in August- fx acetabulum, SDH, fx R orbit ECHO 03/29/2019- stable mitral valve, EF 55-60% Albuterol hfa Used rescue 1x/ yest. Doing well with no recent need for nebulizer.  Hosp 2 weeks then 3 weeks in SNF after dog knocked her down. Still limping.  Continues to work with cardiology- no new events. Asks refill tramadol- age caution.    Observations/Objective:   Assessment and Plan: Asthma/ COPD- controlled, uncomplicated AFib- followed by cardiology  Follow Up Instructions:    I discussed the assessment and treatment plan with the patient. The patient was provided an opportunity to ask questions and all were answered. The patient agreed with the plan and demonstrated an understanding of the instructions.   The patient was advised to call back or seek an in-person evaluation if the symptoms worsen or if the condition fails to improve as anticipated.  I provided  17 minutes of non-face-to-face time during this encounter.   [name], MD      Review of Systems-see HPI   + = positive Constitutional:    weight loss, night sweats, fevers, chills, +fatigue, lassitude. HEENT:   No-  headaches, difficulty swallowing, tooth/dental problems, sore throat, + hoarseness      No-  sneezing, itching, ear ache, no- nasal congestion, +post nasal drip,  CV:  No- chest pain, no-orthopnea, PND, swelling in lower extremities, anasarca, dizziness, palpitations Resp: +   shortness of breath with exertion or at rest.              No-   productive cough,  non-productive cough,  No- coughing up of blood.              No-   change in color of mucus.  wheezing.   Skin:  No-   rash or lesions. GI:  No-   heartburn, indigestion, abdominal pain, nausea, vomiting,  GU: . MS:  No-   joint pain or swelling.  Neuro-     nothing unusual Psych:  No-  change in mood or affect. No depression or anxiety.  No memory loss.  Objective:   Physical Exam General- Alert, Oriented, Affect-appropriate/ cheerful, Distress- none acute, thin Skin- no rash visible now on right lateral ribs Lymphadenopathy- none Head- atraumatic            Eyes- Gross vision intact, PERRLA, +conjunctivae injected            Ears- Hearing, canals-normal            Nose- clear, turbinate edema, no-Septal dev, mucus, polyps, erosion, perforation             Throat- Mallampati III , mucosa clear/ not red , drainage- none, tonsils- atrophic,  Dentures. +hoarseness not worse .                     + Large torus hard palate Neck- flexible , trachea midline, no stridor , thyroid nl, carotid no bruit Chest - symmetrical excursion , unlabored           Heart/CV- +IRR/ AFib  + faint ? Mitral regurg murmur , no gallop  , + HR 132                              no rub, nl s1 s2                           - JVD- none , edema- none, stasis changes- none, varices- none           Lung-  + clear, unlabored, wheeze-none, Cough-none , dullness-none, rub- none           Chest wall-  Abd-  Br/ Gen/ Rectal- Not done, not indicated Extrem- cyanosis- none, clubbing, none, atrophy- none, strength- nl Neuro- slight resting tremor

## 2019-09-05 NOTE — Patient Instructions (Signed)
Refill for Tramadol e-sent. Computer limits how much we can send.  Call for refill of your inhaler when needed.

## 2019-09-06 NOTE — Assessment & Plan Note (Signed)
Feels stable and denies new cardiac events Followed by cardiology

## 2019-09-06 NOTE — Assessment & Plan Note (Signed)
Uncomplicated, well controlled currently. Med talk. Agreed to limited tramadol supply for cough if needed

## 2019-09-15 ENCOUNTER — Other Ambulatory Visit: Payer: Self-pay

## 2019-09-15 ENCOUNTER — Ambulatory Visit (INDEPENDENT_AMBULATORY_CARE_PROVIDER_SITE_OTHER): Payer: Medicare PPO | Admitting: *Deleted

## 2019-09-15 DIAGNOSIS — Z5181 Encounter for therapeutic drug level monitoring: Secondary | ICD-10-CM | POA: Diagnosis not present

## 2019-09-15 DIAGNOSIS — I482 Chronic atrial fibrillation, unspecified: Secondary | ICD-10-CM

## 2019-09-15 LAB — POCT INR: INR: 1.4 — AB (ref 2.0–3.0)

## 2019-09-15 NOTE — Patient Instructions (Signed)
Take 2 tablets x 2 days then increase dose to 1 1/2 tablets daily  Recheck in 2 weeks

## 2019-09-19 DIAGNOSIS — S32591A Other specified fracture of right pubis, initial encounter for closed fracture: Secondary | ICD-10-CM | POA: Diagnosis not present

## 2019-09-29 ENCOUNTER — Other Ambulatory Visit: Payer: Self-pay

## 2019-09-29 ENCOUNTER — Ambulatory Visit (INDEPENDENT_AMBULATORY_CARE_PROVIDER_SITE_OTHER): Payer: Medicare PPO | Admitting: *Deleted

## 2019-09-29 DIAGNOSIS — Z5181 Encounter for therapeutic drug level monitoring: Secondary | ICD-10-CM | POA: Diagnosis not present

## 2019-09-29 DIAGNOSIS — I482 Chronic atrial fibrillation, unspecified: Secondary | ICD-10-CM | POA: Diagnosis not present

## 2019-09-29 LAB — POCT INR: INR: 5.1 — AB (ref 2.0–3.0)

## 2019-09-29 NOTE — Patient Instructions (Signed)
Hold coumadin tonight and tomorrow night then decrease dose to 1 1/2 tablets daily except 1 tablet on Wednesdays and Saturdays Recheck in 2 weeks

## 2019-10-15 ENCOUNTER — Other Ambulatory Visit: Payer: Self-pay | Admitting: Adult Health

## 2019-10-17 ENCOUNTER — Other Ambulatory Visit: Payer: Self-pay

## 2019-10-17 ENCOUNTER — Ambulatory Visit (INDEPENDENT_AMBULATORY_CARE_PROVIDER_SITE_OTHER): Payer: Medicare PPO | Admitting: *Deleted

## 2019-10-17 DIAGNOSIS — I482 Chronic atrial fibrillation, unspecified: Secondary | ICD-10-CM

## 2019-10-17 DIAGNOSIS — Z5181 Encounter for therapeutic drug level monitoring: Secondary | ICD-10-CM

## 2019-10-17 LAB — POCT INR: INR: 3.5 — AB (ref 2.0–3.0)

## 2019-10-17 NOTE — Patient Instructions (Signed)
Hold coumadin tonight then decrease dose to 1 1/2 tablets daily except 1 tablet on Mondays, Wednesdays and Fridays Recheck in 3 weeks 

## 2019-10-20 DIAGNOSIS — S32591A Other specified fracture of right pubis, initial encounter for closed fracture: Secondary | ICD-10-CM | POA: Diagnosis not present

## 2019-10-21 ENCOUNTER — Telehealth: Payer: Self-pay | Admitting: Cardiology

## 2019-10-21 ENCOUNTER — Other Ambulatory Visit: Payer: Self-pay | Admitting: Adult Health

## 2019-10-21 MED ORDER — FUROSEMIDE 40 MG PO TABS: 40.0000 mg | ORAL_TABLET | Freq: Every day | ORAL | 3 refills | Status: DC

## 2019-10-21 NOTE — Telephone Encounter (Signed)
Refilled 90 day lasix

## 2019-10-21 NOTE — Telephone Encounter (Signed)
Needing refill on furosemide (LASIX) 40 MG tablet [[id number]]  Sent to [facility] [place]   There's a NP that wrote the Rx and won't refill it

## 2019-11-09 ENCOUNTER — Other Ambulatory Visit: Payer: Self-pay

## 2019-11-09 ENCOUNTER — Ambulatory Visit (INDEPENDENT_AMBULATORY_CARE_PROVIDER_SITE_OTHER): Payer: Medicare PPO | Admitting: *Deleted

## 2019-11-09 DIAGNOSIS — I482 Chronic atrial fibrillation, unspecified: Secondary | ICD-10-CM | POA: Diagnosis not present

## 2019-11-09 DIAGNOSIS — Z5181 Encounter for therapeutic drug level monitoring: Secondary | ICD-10-CM | POA: Diagnosis not present

## 2019-11-09 LAB — POCT INR: INR: 2 (ref 2.0–3.0)

## 2019-11-09 NOTE — Patient Instructions (Signed)
Continue warfarin 1 1/2 tablets daily except 1 tablet on Mondays, Wednesdays and Fridays Recheck in 4 weeks

## 2019-11-17 DIAGNOSIS — S32591A Other specified fracture of right pubis, initial encounter for closed fracture: Secondary | ICD-10-CM | POA: Diagnosis not present

## 2019-11-18 ENCOUNTER — Other Ambulatory Visit: Payer: Self-pay | Admitting: Cardiology

## 2019-12-15 ENCOUNTER — Ambulatory Visit (INDEPENDENT_AMBULATORY_CARE_PROVIDER_SITE_OTHER): Payer: Medicare PPO | Admitting: *Deleted

## 2019-12-15 ENCOUNTER — Other Ambulatory Visit: Payer: Self-pay

## 2019-12-15 DIAGNOSIS — Z5181 Encounter for therapeutic drug level monitoring: Secondary | ICD-10-CM

## 2019-12-15 DIAGNOSIS — I482 Chronic atrial fibrillation, unspecified: Secondary | ICD-10-CM

## 2019-12-15 LAB — POCT INR: INR: 2 (ref 2.0–3.0)

## 2019-12-15 NOTE — Patient Instructions (Signed)
Continue warfarin 1 1/2 tablets daily except 1 tablet on Mondays, Wednesdays and Fridays Recheck in 4 weeks

## 2019-12-18 DIAGNOSIS — S32591A Other specified fracture of right pubis, initial encounter for closed fracture: Secondary | ICD-10-CM | POA: Diagnosis not present

## 2020-01-12 DIAGNOSIS — I4891 Unspecified atrial fibrillation: Secondary | ICD-10-CM | POA: Diagnosis not present

## 2020-01-12 DIAGNOSIS — E538 Deficiency of other specified B group vitamins: Secondary | ICD-10-CM | POA: Diagnosis not present

## 2020-01-12 DIAGNOSIS — E119 Type 2 diabetes mellitus without complications: Secondary | ICD-10-CM | POA: Diagnosis not present

## 2020-01-12 DIAGNOSIS — E782 Mixed hyperlipidemia: Secondary | ICD-10-CM | POA: Diagnosis not present

## 2020-01-12 DIAGNOSIS — F419 Anxiety disorder, unspecified: Secondary | ICD-10-CM | POA: Diagnosis not present

## 2020-01-12 DIAGNOSIS — E559 Vitamin D deficiency, unspecified: Secondary | ICD-10-CM | POA: Diagnosis not present

## 2020-01-12 DIAGNOSIS — Z6824 Body mass index (BMI) 24.0-24.9, adult: Secondary | ICD-10-CM | POA: Diagnosis not present

## 2020-01-12 DIAGNOSIS — J302 Other seasonal allergic rhinitis: Secondary | ICD-10-CM | POA: Diagnosis not present

## 2020-01-12 DIAGNOSIS — I509 Heart failure, unspecified: Secondary | ICD-10-CM | POA: Diagnosis not present

## 2020-01-12 DIAGNOSIS — I10 Essential (primary) hypertension: Secondary | ICD-10-CM | POA: Diagnosis not present

## 2020-01-17 DIAGNOSIS — S32591A Other specified fracture of right pubis, initial encounter for closed fracture: Secondary | ICD-10-CM | POA: Diagnosis not present

## 2020-01-19 ENCOUNTER — Ambulatory Visit (INDEPENDENT_AMBULATORY_CARE_PROVIDER_SITE_OTHER): Payer: Medicare PPO | Admitting: *Deleted

## 2020-01-19 ENCOUNTER — Other Ambulatory Visit: Payer: Self-pay

## 2020-01-19 DIAGNOSIS — Z5181 Encounter for therapeutic drug level monitoring: Secondary | ICD-10-CM | POA: Diagnosis not present

## 2020-01-19 DIAGNOSIS — I482 Chronic atrial fibrillation, unspecified: Secondary | ICD-10-CM | POA: Diagnosis not present

## 2020-01-19 LAB — POCT INR: INR: 1 — AB (ref 2.0–3.0)

## 2020-01-19 NOTE — Patient Instructions (Signed)
Take warfarin 2 tablets x 3 days then resume 1 1/2 tablets daily except 1 tablet on Mondays, Wednesdays and Fridays Recheck in 2 weeks

## 2020-01-24 ENCOUNTER — Telehealth: Payer: Self-pay | Admitting: Internal Medicine

## 2020-01-24 ENCOUNTER — Other Ambulatory Visit: Payer: Self-pay | Admitting: Internal Medicine

## 2020-01-24 MED ORDER — ALBUTEROL SULFATE HFA 108 (90 BASE) MCG/ACT IN AERS: 2.0000 | INHALATION_SPRAY | Freq: Four times a day (QID) | RESPIRATORY_TRACT | 5 refills | Status: DC | PRN

## 2020-01-24 NOTE — Telephone Encounter (Signed)
Called and spoke with pt. Verified preferred pharmacy and inhaler that was needing to be sent in for her. Med has been sent to pharmacy for pt. Nothing further needed.

## 2020-02-02 ENCOUNTER — Ambulatory Visit (INDEPENDENT_AMBULATORY_CARE_PROVIDER_SITE_OTHER): Payer: Medicare PPO | Admitting: *Deleted

## 2020-02-02 DIAGNOSIS — I482 Chronic atrial fibrillation, unspecified: Secondary | ICD-10-CM

## 2020-02-02 DIAGNOSIS — Z5181 Encounter for therapeutic drug level monitoring: Secondary | ICD-10-CM | POA: Diagnosis not present

## 2020-02-02 LAB — POCT INR: INR: 4.9 — AB (ref 2.0–3.0)

## 2020-02-02 NOTE — Patient Instructions (Signed)
Hold warfarin x 2 days then resume 1 1/2 tablets daily except 1 tablet on Mondays, Wednesdays and Fridays Recheck in 10 days

## 2020-02-13 ENCOUNTER — Ambulatory Visit (INDEPENDENT_AMBULATORY_CARE_PROVIDER_SITE_OTHER): Payer: Medicare PPO | Admitting: *Deleted

## 2020-02-13 ENCOUNTER — Other Ambulatory Visit: Payer: Self-pay

## 2020-02-13 DIAGNOSIS — I482 Chronic atrial fibrillation, unspecified: Secondary | ICD-10-CM | POA: Diagnosis not present

## 2020-02-13 DIAGNOSIS — Z5181 Encounter for therapeutic drug level monitoring: Secondary | ICD-10-CM

## 2020-02-13 LAB — POCT INR: INR: 3.4 — AB (ref 2.0–3.0)

## 2020-02-13 NOTE — Patient Instructions (Signed)
Hold warfarin tonight then decrease dose to 1 tablet daily except 1 1/2 tablets on Sundays and Thursdays Recheck in 2 wks

## 2020-02-17 DIAGNOSIS — S32591A Other specified fracture of right pubis, initial encounter for closed fracture: Secondary | ICD-10-CM | POA: Diagnosis not present

## 2020-02-23 DIAGNOSIS — J449 Chronic obstructive pulmonary disease, unspecified: Secondary | ICD-10-CM | POA: Diagnosis not present

## 2020-02-23 DIAGNOSIS — Z0000 Encounter for general adult medical examination without abnormal findings: Secondary | ICD-10-CM | POA: Diagnosis not present

## 2020-02-23 DIAGNOSIS — Z6824 Body mass index (BMI) 24.0-24.9, adult: Secondary | ICD-10-CM | POA: Diagnosis not present

## 2020-02-23 DIAGNOSIS — Z1389 Encounter for screening for other disorder: Secondary | ICD-10-CM | POA: Diagnosis not present

## 2020-02-23 DIAGNOSIS — J4540 Moderate persistent asthma, uncomplicated: Secondary | ICD-10-CM | POA: Diagnosis not present

## 2020-02-23 DIAGNOSIS — I4891 Unspecified atrial fibrillation: Secondary | ICD-10-CM | POA: Diagnosis not present

## 2020-02-27 ENCOUNTER — Ambulatory Visit: Payer: Medicare PPO | Admitting: Cardiology

## 2020-02-27 ENCOUNTER — Ambulatory Visit (INDEPENDENT_AMBULATORY_CARE_PROVIDER_SITE_OTHER): Payer: Medicare PPO | Admitting: *Deleted

## 2020-02-27 ENCOUNTER — Encounter: Payer: Self-pay | Admitting: Cardiology

## 2020-02-27 ENCOUNTER — Other Ambulatory Visit: Payer: Self-pay

## 2020-02-27 VITALS — BP 128/60 | HR 77 | Ht 66.0 in | Wt 158.0 lb

## 2020-02-27 DIAGNOSIS — I38 Endocarditis, valve unspecified: Secondary | ICD-10-CM

## 2020-02-27 DIAGNOSIS — Z5181 Encounter for therapeutic drug level monitoring: Secondary | ICD-10-CM

## 2020-02-27 DIAGNOSIS — I482 Chronic atrial fibrillation, unspecified: Secondary | ICD-10-CM

## 2020-02-27 DIAGNOSIS — I4821 Permanent atrial fibrillation: Secondary | ICD-10-CM | POA: Diagnosis not present

## 2020-02-27 DIAGNOSIS — Z8679 Personal history of other diseases of the circulatory system: Secondary | ICD-10-CM | POA: Diagnosis not present

## 2020-02-27 LAB — POCT INR
INR: 6.6 — AB (ref 2.0–3.0)
INR: 6.6 — AB (ref 2.0–3.0)

## 2020-02-27 NOTE — Progress Notes (Signed)
Cardiology Office Note  Date: 02/27/2020   ID: [name], DOB Dec 08, 1933, MRN [id number]  PCP:  Sharilyn Sites, MD  Cardiologist:  [name], MD Electrophysiologist:  None   Chief Complaint  Patient presents with  . Cardiac follow-up    History of Present Illness: [name] is an 86 y.o. female last assessed via telehealth encounter in December 2020.  She presents for a routine visit.  Overall no major change from a cardiac perspective, she does not report any palpitations, no orthopnea or PND.  She remains chronically short of breath with significant COPD, currently with bronchitis flare on steroids and antibiotics.  She continues on Coumadin with follow-up in the anticoagulation clinic. INR supratherapeutic today.  She does not report any bleeding problems.  Echocardiogram from July 2020 is outlined below.  We will plan an updated study prior to her next visit.  I personally reviewed her ECG today which shows rate controlled atrial fibrillation with low voltage and rightward axis.  She remains on Lanoxin and Cardizem CD for heart rate control.  She reports having had the coronavirus vaccine.  Past Medical History:  Diagnosis Date  . Asthma   . Atrial fibrillation ([place])   . Chronic rhinitis   . COPD (chronic obstructive pulmonary disease) ([place])   . GERD (gastroesophageal reflux disease)   . Mitral regurgitation   . Mitral valve prolapse   . Nasal polyps   . Nonischemic cardiomyopathy (HCC)    LVEF 25-30% improved to 45-50%  . Obstructive sleep apnea   . Transudative pleural effusion     Past Surgical History:  Procedure Laterality Date  . APPENDECTOMY  2000  . Bilateral cataract surgery    . LAPAROSCOPIC NISSEN FUNDOPLICATION  0762  . NOSE SURGERY      Current Outpatient Medications  Medication Sig Dispense Refill  . acetaminophen (TYLENOL) 325 MG tablet Take 2 tablets (650 mg total) by mouth every 6 (six) hours as needed for mild pain, fever or  headache.    . albuterol (VENTOLIN HFA) 108 (90 Base) MCG/ACT inhaler Inhale 2 puffs into the lungs every 6 (six) hours as needed for wheezing or shortness of breath. 6.7 g 5  . digoxin (LANOXIN) 0.125 MG tablet Take 1 tablet (0.125 mg total) by mouth daily. Hold if apical pulse is less than 60 30 tablet 0  . diltiazem (CARDIZEM CD) 240 MG 24 hr capsule TAKE 1 CAPSULE BY MOUTH EVERY DAY 90 capsule 3  . docusate sodium (COLACE) 100 MG capsule Take 100 mg by mouth 2 (two) times daily.    Marland Kitchen escitalopram (LEXAPRO) 10 MG tablet Take 1 tablet (10 mg total) by mouth every morning. 30 tablet 0  . esomeprazole (NEXIUM) 40 MG capsule Take 1 capsule (40 mg total) by mouth 2 (two) times daily. 60 capsule 0  . furosemide (LASIX) 40 MG tablet Take 1 tablet (40 mg total) by mouth daily. 90 tablet 3  . NON FORMULARY Diet- Regular    . phenylephrine-shark liver oil-mineral oil-petrolatum (PREPARATION H) 0.25-3-14-71.9 % rectal ointment Place 1 application rectally 2 (two) times daily as needed for hemorrhoids.    . polyethylene glycol (MIRALAX / GLYCOLAX) 17 g packet Take 17 g by mouth daily. 14 each 0  . potassium chloride SA (K-DUR) 20 MEQ tablet Take 1 tablet (20 mEq total) by mouth daily. 30 tablet 0  . traMADol (ULTRAM) 50 MG tablet 1 tab twice daily if needed 10 tablet 0  . warfarin (COUMADIN)  5 MG tablet Take 1 tablet (5 mg total) by mouth daily at 6 PM. Once A Day on Mon, Tue, Wed, Fri 30 tablet 0  . warfarin (COUMADIN) 6 MG tablet Take 1 tablet (6 mg total) by mouth daily at 6 PM. Once A Day on Sun, Thu, Sat 30 tablet 0   Current Facility-Administered Medications  Medication Dose Route Frequency Provider Last Rate Last Admin  . omalizumab Arvid Right) injection 300 mg  300 mg Subcutaneous Q14 Days Annamaria Boots, Clinton D, MD   300 mg at 08/04/16 1221   Allergies:  Amoxicillin-pot clavulanate, Clarithromycin, Clindamycin, Doxycycline, and Penicillins   ROS:   Recent cough and chest congestion.  Physical  Exam: VS:  BP 128/60   Pulse 77   Ht 5\' 6"  (1.676 m)   Wt 158 lb (71.7 kg)   SpO2 97%   BMI 25.50 kg/m , BMI Body mass index is 25.5 kg/m.  Wt Readings from Last 3 Encounters:  02/27/20 158 lb (71.7 kg)  08/22/19 148 lb (67.1 kg)  05/20/19 145 lb 6.4 oz (66 kg)    General:  Elderly woman, no distress. HEENT: Conjunctiva and lids normal, wearing a mask. Neck: Supple, no elevated JVP or carotid bruits, no thyromegaly. Lungs: Coarse breath sounds with expiratory wheeze and rhonchi. Cardiac: Irregularly irregular without gallop. Extremities: Mild lower leg edema.  ECG:  An ECG dated 05/22/2018 was personally reviewed today and demonstrated:  Atrial fibrillation with borderline low voltage and nonspecific ST changes.  Recent Labwork: 04/24/2019: ALT 20; AST 26 04/26/2019: BUN 12; Creatinine, Ser 1.10; Potassium 4.5; Sodium 136 05/03/2019: Hemoglobin 10.7; Platelets 171   Other Studies Reviewed Today:  Echocardiogram 03/29/2019: 1. The left ventricle has normal systolic function, with an ejection fraction of 55-60%. The cavity size was normal. There is mildly increased left ventricular wall thickness. Left ventricular diastolic Doppler parameters are indeterminate. 2. The right ventricle has normal systolic function. The cavity was normal. There is no increase in right ventricular wall thickness. 3. Left atrial size was severely dilated. 4. Right atrial size was severely dilated. 5. The aortic valve is tricuspid. Mild thickening of the aortic valve. Mild calcification of the aortic valve. Aortic valve regurgitation is mild by color flow Doppler. No stenosis of the aortic valve. Mild aortic annular calcification noted. 6. The mitral valve is abnormal. Mild thickening of the mitral valve leaflet. Mild calcification of the mitral valve leaflet. There is mild mitral annular calcification present. Mitral valve regurgitation is mild to moderate by color flow Doppler.  Mitral stenosis is  moderate by mean gradient of 7 mmHg (HR 80 bpm). 7. The aortic root is normal in size and structure. 8. Pulmonary hypertension is indeterminant, inadequate TR jet.  Assessment and Plan:  1.  Permanent atrial fibrillation.  CHA2DS2-VASc score is at least 4.  She remains on Coumadin for stroke prophylaxis, supratherapeutic today in the setting of recent steroids and antibiotic use.  She is being followed in the anticoagulation clinic with adjustments made.  Continue Cardizem CD and Lanoxin for heart rate control.  2.  History of tachycardia-mediated cardiomyopathy.  LVEF 55 to 60% by echocardiogram in July of last year.  We will obtain a follow-up study for her next visit.  3.  Valvular heart disease including mild aortic regurgitation and moderate mitral stenosis.  Medication Adjustments/Labs and Tests Ordered: Current medicines are reviewed at length with the patient today.  Concerns regarding medicines are outlined above.   Tests Ordered: Orders Placed This Encounter  Procedures  .  EKG 12-Lead  . ECHOCARDIOGRAM COMPLETE    Medication Changes: No orders of the defined types were placed in this encounter.   Disposition:  Follow up 6 months in the [place] office.  Signed, Satira Sark, MD, [facility] 02/27/2020 3:48 PM    [place] [facility] [place], [place], [place] [address] Phone: [phone]; Fax: [phone]

## 2020-02-27 NOTE — Patient Instructions (Signed)
Started prednisone 10mg  taper and Amoxicillin on 6/17 Hold warfarin x 3 days (Mon,Tues,Wed)  Recheck INR on Thursday 6/24 Bleeding and fall precautions discussed with pt and she verbalized understanding

## 2020-02-27 NOTE — Patient Instructions (Signed)
Your physician wants you to follow-up in: [place] will receive a reminder letter in the mail two months in advance. If you don't receive a letter, please call our office to schedule the follow-up appointment.  Your physician recommends that you continue on your current medications as directed. Please refer to the Current Medication list given to you today.  Your physician has requested that you have an echocardiogram IN 6 MONTHS BEFORE YOUR NEXT APPOINTMENT. Echocardiography is a painless test that uses sound waves to create images of your heart. It provides your doctor with information about the size and shape of your heart and how well your heart's chambers and valves are working. This procedure takes approximately one hour. There are no restrictions for this procedure.  Thank you for choosing [facility]!!

## 2020-03-01 ENCOUNTER — Other Ambulatory Visit: Payer: Self-pay

## 2020-03-01 ENCOUNTER — Ambulatory Visit (INDEPENDENT_AMBULATORY_CARE_PROVIDER_SITE_OTHER): Payer: Medicare PPO | Admitting: *Deleted

## 2020-03-01 DIAGNOSIS — I482 Chronic atrial fibrillation, unspecified: Secondary | ICD-10-CM

## 2020-03-01 DIAGNOSIS — Z5181 Encounter for therapeutic drug level monitoring: Secondary | ICD-10-CM | POA: Diagnosis not present

## 2020-03-01 LAB — POCT INR: INR: 2.9 (ref 2.0–3.0)

## 2020-03-01 NOTE — Patient Instructions (Signed)
Started prednisone 10mg  taper and Amoxicillin on 6/17.  Will finish Amoxicillin 6/26 and Prednisone on 7/2. Decreased warfarin to 1 tablet daily except 1/2 tablet on Sundays and Thursdays till INR check on Thursday 03/08/20

## 2020-03-05 ENCOUNTER — Ambulatory Visit: Payer: Medicare PPO | Admitting: Internal Medicine

## 2020-03-09 ENCOUNTER — Telehealth: Payer: Self-pay | Admitting: Internal Medicine

## 2020-03-09 DIAGNOSIS — J449 Chronic obstructive pulmonary disease, unspecified: Secondary | ICD-10-CM

## 2020-03-09 NOTE — Telephone Encounter (Signed)
Patient called she is needing new supplies. Order has been placed and nothing further is needed at this time.

## 2020-03-14 ENCOUNTER — Other Ambulatory Visit (HOSPITAL_COMMUNITY): Payer: Medicare PPO

## 2020-03-18 DIAGNOSIS — S32591A Other specified fracture of right pubis, initial encounter for closed fracture: Secondary | ICD-10-CM | POA: Diagnosis not present

## 2020-03-22 ENCOUNTER — Other Ambulatory Visit: Payer: Self-pay | Admitting: Cardiology

## 2020-03-22 ENCOUNTER — Other Ambulatory Visit: Payer: Self-pay | Admitting: *Deleted

## 2020-03-22 MED ORDER — DIGOXIN 125 MCG PO TABS: 0.1250 mg | ORAL_TABLET | Freq: Every day | ORAL | 1 refills | Status: DC

## 2020-03-22 MED ORDER — DILTIAZEM HCL ER COATED BEADS 240 MG PO CP24: ORAL_CAPSULE | ORAL | 3 refills | Status: DC

## 2020-03-22 NOTE — Telephone Encounter (Signed)
Medication sent to pharmacy  

## 2020-03-26 ENCOUNTER — Ambulatory Visit (INDEPENDENT_AMBULATORY_CARE_PROVIDER_SITE_OTHER): Payer: Medicare PPO | Admitting: *Deleted

## 2020-03-26 DIAGNOSIS — I482 Chronic atrial fibrillation, unspecified: Secondary | ICD-10-CM | POA: Diagnosis not present

## 2020-03-26 DIAGNOSIS — Z5181 Encounter for therapeutic drug level monitoring: Secondary | ICD-10-CM

## 2020-03-26 LAB — POCT INR: INR: 1 — AB (ref 2.0–3.0)

## 2020-03-26 NOTE — Patient Instructions (Signed)
Restart warfarin 1 tablet daily except 1 1/2 tablets on Sundays and Wednesdays Recheck in 7 -10 days

## 2020-04-02 ENCOUNTER — Telehealth: Payer: Self-pay | Admitting: *Deleted

## 2020-04-02 NOTE — Telephone Encounter (Signed)
Spoke with pt.  She was started on a Prednisone 10mg  taper (5x3, 4x3, 3x3, 2x3,1x3) on Saturday 7/24.  She has INR appt scheduled for 7/29.  Told pt to decrease warfarin to 1/2 tablet daily (2.5mg ) until INR check on Thursday.  She verbalized understanding.

## 2020-04-02 NOTE — Telephone Encounter (Signed)
Patient called requesting to speak with Edrick Oh, RN in regards to a medication change.

## 2020-04-05 ENCOUNTER — Ambulatory Visit (INDEPENDENT_AMBULATORY_CARE_PROVIDER_SITE_OTHER): Payer: Medicare PPO | Admitting: *Deleted

## 2020-04-05 DIAGNOSIS — Z5181 Encounter for therapeutic drug level monitoring: Secondary | ICD-10-CM

## 2020-04-05 DIAGNOSIS — I482 Chronic atrial fibrillation, unspecified: Secondary | ICD-10-CM

## 2020-04-05 LAB — POCT INR: INR: 2 (ref 2.0–3.0)

## 2020-04-05 NOTE — Patient Instructions (Signed)
Decrease warfarin to 1 tablet daily while still on prednisone taper.  Starts 30mg  x 3 days tomorrow. Recheck in 7 days

## 2020-04-06 ENCOUNTER — Telehealth: Payer: Self-pay | Admitting: *Deleted

## 2020-04-06 ENCOUNTER — Other Ambulatory Visit: Payer: Self-pay | Admitting: *Deleted

## 2020-04-06 MED ORDER — ALBUTEROL SULFATE (2.5 MG/3ML) 0.083% IN NEBU: 2.5000 mg | INHALATION_SOLUTION | Freq: Four times a day (QID) | RESPIRATORY_TRACT | 12 refills | Status: DC | PRN

## 2020-04-06 NOTE — Telephone Encounter (Signed)
Albuterol neb solution script sent to [facility]

## 2020-04-06 NOTE — Telephone Encounter (Signed)
Received a fax from patient's pharmacy requesting a new script for albuterol for her nebulizer.  Please advise if ok to send to [facility] pharmacy.  Thank you.

## 2020-04-12 ENCOUNTER — Ambulatory Visit (INDEPENDENT_AMBULATORY_CARE_PROVIDER_SITE_OTHER): Payer: Medicare PPO | Admitting: *Deleted

## 2020-04-12 DIAGNOSIS — Z5181 Encounter for therapeutic drug level monitoring: Secondary | ICD-10-CM

## 2020-04-12 DIAGNOSIS — I482 Chronic atrial fibrillation, unspecified: Secondary | ICD-10-CM | POA: Diagnosis not present

## 2020-04-12 LAB — POCT INR: INR: 4 — AB (ref 2.0–3.0)

## 2020-04-12 NOTE — Patient Instructions (Signed)
Hold warfarin tonight then resume 1 tablet daily except 1 1/2 tablets on Sundays and Wednesdays.  Finished prednisone Recheck in 7 - 10 days

## 2020-04-18 DIAGNOSIS — S32591A Other specified fracture of right pubis, initial encounter for closed fracture: Secondary | ICD-10-CM | POA: Diagnosis not present

## 2020-04-23 ENCOUNTER — Telehealth: Payer: Self-pay | Admitting: Internal Medicine

## 2020-04-23 MED ORDER — PREDNISONE 10 MG PO TABS: ORAL_TABLET | ORAL | 0 refills | Status: DC

## 2020-04-23 NOTE — Telephone Encounter (Signed)
Called spoke with patient and niece.  Patient states she has been short of breath since Saturday even sitting still.  She is not on oxygen but checked her O2 sats on Saturday and was 92%.  Patient states she cannot move around she gets unsteady and out of breath.  Patient also is complaining of a cough. It is productive at times with clear mucus. She is weak but not tired. Denies body aches, fever, runny nose.  Patient is fully vaccinated with her last covid shot on 02/14/20.  Dr. Annamaria Boots please advise patient asking for prednisone  Allergies  Allergen Reactions  . Amoxicillin-Pot Clavulanate Itching and Swelling  . Clarithromycin Itching and Swelling  . Clindamycin Itching and Swelling  . Doxycycline Itching and Swelling  . Penicillins Itching and Swelling    Did it involve swelling of the face/tongue/throat, SOB, or low BP? Yes Did it involve sudden or severe rash/hives, skin peeling, or any reaction on the inside of your mouth or nose? No Did you need to seek medical attention at a hospital or doctor's office? Yes When did it last happen?Over 10 years If all above answers are "NO", may proceed with cephalosporin use.    Current Outpatient Medications on File Prior to Visit  Medication Sig Dispense Refill  . digoxin (LANOXIN) 0.125 MG tablet TAKE 1 TABLET EVERY DAY 90 tablet 1  . acetaminophen (TYLENOL) 325 MG tablet Take 2 tablets (650 mg total) by mouth every 6 (six) hours as needed for mild pain, fever or headache.    . albuterol (PROVENTIL) (2.5 MG/3ML) 0.083% nebulizer solution Take 3 mLs (2.5 mg total) by nebulization every 6 (six) hours as needed for wheezing or shortness of breath. 75 mL 12  . albuterol (VENTOLIN HFA) 108 (90 Base) MCG/ACT inhaler Inhale 2 puffs into the lungs every 6 (six) hours as needed for wheezing or shortness of breath. 6.7 g 5  . diltiazem (CARDIZEM CD) 240 MG 24 hr capsule TAKE 1 CAPSULE BY MOUTH EVERY DAY 90 capsule 3  . docusate sodium (COLACE) 100 MG  capsule Take 100 mg by mouth 2 (two) times daily.    Marland Kitchen escitalopram (LEXAPRO) 10 MG tablet Take 1 tablet (10 mg total) by mouth every morning. 30 tablet 0  . esomeprazole (NEXIUM) 40 MG capsule Take 1 capsule (40 mg total) by mouth 2 (two) times daily. 60 capsule 0  . furosemide (LASIX) 40 MG tablet Take 1 tablet (40 mg total) by mouth daily. 90 tablet 3  . NON FORMULARY Diet- Regular    . phenylephrine-shark liver oil-mineral oil-petrolatum (PREPARATION H) 0.25-3-14-71.9 % rectal ointment Place 1 application rectally 2 (two) times daily as needed for hemorrhoids.    . polyethylene glycol (MIRALAX / GLYCOLAX) 17 g packet Take 17 g by mouth daily. 14 each 0  . potassium chloride SA (K-DUR) 20 MEQ tablet Take 1 tablet (20 mEq total) by mouth daily. 30 tablet 0  . traMADol (ULTRAM) 50 MG tablet 1 tab twice daily if needed 10 tablet 0  . warfarin (COUMADIN) 5 MG tablet Take 1 tablet (5 mg total) by mouth daily at 6 PM. Once A Day on Mon, Tue, Wed, Fri 30 tablet 0  . warfarin (COUMADIN) 6 MG tablet Take 1 tablet (6 mg total) by mouth daily at 6 PM. Once A Day on Sun, Thu, Sat 30 tablet 0   Current Facility-Administered Medications on File Prior to Visit  Medication Dose Route Frequency Provider Last Rate Last Admin  . omalizumab Arvid Right)  injection 300 mg  300 mg Subcutaneous Q14 Days [name] D, MD   300 mg at 08/04/16 1221

## 2020-04-23 NOTE — Telephone Encounter (Signed)
Ok to send prednisone taper as requested:     Prednisone 10 mg, # 20, 4 X 2 DAYS, 3 X 2 DAYS, 2 X 2 DAYS, 1 X 2 DAYS  Please remind her that she has had heart problems before, that can feel like this. If she doesn't get better quickly she should be seen, and may need to call her cardiologist.

## 2020-04-23 NOTE — Telephone Encounter (Signed)
Called and spoke with pt's niece Angie letting her know the info stated by CY and she verbalized understanding. Verified preferred pharmacy and sent pred taper to pharmacy for pt. Nothing further needed.

## 2020-04-23 NOTE — Telephone Encounter (Signed)
Called and left message for patient to return call and provide [place] more information regarding her sick call.

## 2020-04-23 NOTE — Telephone Encounter (Signed)
Pt returning missed call. Please advise.[phone]

## 2020-04-24 ENCOUNTER — Ambulatory Visit: Payer: Medicare PPO | Admitting: Orthopaedic Surgery

## 2020-04-26 ENCOUNTER — Telehealth: Payer: Self-pay | Admitting: Cardiology

## 2020-04-26 NOTE — Telephone Encounter (Signed)
I spoke with patient.She will take lasix 80 mg daily for 3 days and then call us back with update.

## 2020-04-26 NOTE — Telephone Encounter (Signed)
Attempted to reach, [id number]    Patient called back. She has had bi-lat leg swelling for the last week. When seen in office in June, she had only mild lower leg edema. She states they have been eating out for dinner quite frequently and admits this may be part of her problem. Was given steroids from her pulmonologist wish has helped with her SOB.    She will call me back with her weight.

## 2020-04-26 NOTE — Telephone Encounter (Signed)
New message    Patients weight is 169 , her legs are outrageously big . Patient was 158 last month

## 2020-04-26 NOTE — Telephone Encounter (Signed)
Thank you for the update. It could be that eating out has provided more salt in her diet leading to further leg swelling, steroids could also contribute to this as well. Let's have her double her Lasix for the next 3 days with baseline potassium supplement as before. Hopefully this will help get her going back in the right direction. We might even have to increase her standing Lasix dose from 40 mg to 60 mg daily depending on how she does.

## 2020-04-26 NOTE — Telephone Encounter (Signed)
Please give pt a call-- called stating she's having trouble breathing, is having swelling in her feet/ankles.  Stated her lung Dr did put her on prednisone and it's helped her some  Please call [phone]

## 2020-05-01 ENCOUNTER — Telehealth: Payer: Self-pay | Admitting: Cardiology

## 2020-05-01 NOTE — Telephone Encounter (Signed)
New message     Pt c/o swelling: STAT is pt has developed SOB within 24 hours  1) How much weight have you gained and in what time span?  8/19 168 8/22 162 8/23 158 8/24 am 144 right now 152.6   2) If swelling, where is the swelling located?swelling in legs and ankle   3) Are you currently taking a fluid pill?  yes  4) Are you currently SOB?  Yes she is sob   5) Do you have a log of your daily weights (if so, list)? See above  6) Have you gained 3 pounds in a day or 5 pounds in a week?  Lost 16 lbs   7) Have you traveled recently? no

## 2020-05-01 NOTE — Telephone Encounter (Signed)
It looks like fluid weight has decreased fairly well.  Would resume previous Lasix dosing and keep scheduled follow-up as before.

## 2020-05-01 NOTE — Telephone Encounter (Signed)
Pt called to give an update and report that she has some swelling in her legs and feet. Pt denies having chest pain but does c/o SOB. Pt reports that her SOB did get some better after coughing up mucus. Please advise.

## 2020-05-01 NOTE — Telephone Encounter (Signed)
Pt notified and voiced understanding 

## 2020-05-03 DIAGNOSIS — R05 Cough: Secondary | ICD-10-CM | POA: Diagnosis not present

## 2020-05-10 ENCOUNTER — Ambulatory Visit: Payer: Medicare PPO | Admitting: Internal Medicine

## 2020-05-10 ENCOUNTER — Other Ambulatory Visit: Payer: Self-pay

## 2020-05-10 ENCOUNTER — Encounter: Payer: Self-pay | Admitting: Internal Medicine

## 2020-05-10 VITALS — BP 116/70 | HR 71 | Temp 97.3°F | Ht 66.0 in | Wt 155.8 lb

## 2020-05-10 DIAGNOSIS — J449 Chronic obstructive pulmonary disease, unspecified: Secondary | ICD-10-CM

## 2020-05-10 DIAGNOSIS — J0100 Acute maxillary sinusitis, unspecified: Secondary | ICD-10-CM | POA: Diagnosis not present

## 2020-05-10 DIAGNOSIS — J324 Chronic pansinusitis: Secondary | ICD-10-CM | POA: Diagnosis not present

## 2020-05-10 DIAGNOSIS — R0600 Dyspnea, unspecified: Secondary | ICD-10-CM

## 2020-05-10 DIAGNOSIS — I429 Cardiomyopathy, unspecified: Secondary | ICD-10-CM | POA: Diagnosis not present

## 2020-05-10 DIAGNOSIS — J45901 Unspecified asthma with (acute) exacerbation: Secondary | ICD-10-CM

## 2020-05-10 DIAGNOSIS — I509 Heart failure, unspecified: Secondary | ICD-10-CM

## 2020-05-10 MED ORDER — BREZTRI AEROSPHERE 160-9-4.8 MCG/ACT IN AERO: 2.0000 | INHALATION_SPRAY | Freq: Two times a day (BID) | RESPIRATORY_TRACT | 0 refills | Status: DC

## 2020-05-10 NOTE — Progress Notes (Signed)
Patient ID: [name], female    DOB: Oct 16, 1933, 86 y.o.   MRN: [id number]  HPI F former smoker, followed for Chronic asthma/ COPD/ Xolair, chronic rhinosinusitis, complicated by hx AFib/ anticoagulation, CHF, cardiomyopathy, mitral insufficiency, GERD, CAD Echocardiogram-atrial fibrillation, LVEF 30-35%, diffuse hypokinesis  ---------------------------------------------------------------------------------------------------------   09/05/2019- Virtual Visit via Telephone Note   History of Present Illness: 86 year old female former smoker followed for chronic asthma/COPD, chronic rhinosinusitis, lung nodules complicated by history A. fib/anticoagulation, CHF, cardiomyopathy, mitral insufficiency, GERD Barrett's, CAD Xolair- stopped by patient in 2018 when there was ? of throat cancer [place] in August- fx acetabulum, SDH, fx R orbit ECHO 03/29/2019- stable mitral valve, EF 55-60% Albuterol hfa Used rescue 1x/ yest. Doing well with no recent need for nebulizer.  Hosp 2 weeks then 3 weeks in SNF after dog knocked her down. Still limping.  Continues to work with cardiology- no new events. Asks refill tramadol- age caution.    Observations/Objective:   Assessment and Plan: Asthma/ COPD- controlled, uncomplicated AFib- followed by cardiology  Follow Up Instructions:    05/10/20- 86 year old female former smoker followed for chronic asthma/COPD, chronic rhinosinusitis, lung nodules complicated by history A. Fib/anticoagulation/ coumadin, CHF, cardiomyopathy, mitral insufficiency, GERD Barrett's, CAD -----Pt states that she has had more problems with worsening SOB. Pt denies any complaints of cough but states that she does have postnasal drainage. Had 2 Moderna Covax We sent pred taper 8/16 Neb albuterol, Ventolin hfa,  The day after her second Moderna Covax she states she became more SOB, wheeze, increased PN drip with increased ankle edema. Initially blamed allergy. PCP gave  prednisone which helped.Repeated pred and ?augmentin in July. Last prednisone from [place] ended 1 week ago. Insurance sent out [facility].  Dr Domenic Pol[facility] Cardiology doubled lasix which helped edema, with dose now back as before.   Review of Systems-see HPI   + = positive Constitutional:    weight loss, night sweats, fevers, chills, +fatigue, lassitude. HEENT:   No-  headaches, difficulty swallowing, tooth/dental problems, sore throat, + hoarseness      No-  sneezing, itching, ear ache, no- nasal congestion, +post nasal drip,  CV:  No- chest pain, no-orthopnea, PND, swelling in lower extremities, anasarca, dizziness, palpitations Resp: +   shortness of breath with exertion or at rest.              No-   productive cough,  non-productive cough,  No- coughing up of blood.              No-   change in color of mucus.  wheezing.   Skin: No-   rash or lesions. GI:  No-   heartburn, indigestion, abdominal pain, nausea, vomiting,  GU: . MS:  No-   joint pain or swelling.  Neuro-     nothing unusual Psych:  No- change in mood or affect. No depression or anxiety.  No memory loss.  Objective:   Physical Exam General- Alert, Oriented, Affect-appropriate/ cheerful, Distress- none acute, thin Skin- no rash visible now on right lateral ribs Lymphadenopathy- none Head- atraumatic            Eyes- Gross vision intact, PERRLA, +conjunctivae injected            Ears- Hearing, canals-normal            Nose- clear, turbinate edema, no-Septal dev, mucus, polyps, erosion, perforation             Throat- Mallampati III , mucosa  clear/ not red , drainage- none, tonsils- atrophic,  Dentures. +hoarseness not worse .                     + Large torus hard palate Neck- flexible , trachea midline, no stridor , thyroid nl, carotid no bruit Chest - symmetrical excursion , unlabored           Heart/CV- +IRR/ AFib  + faint ? Mitral regurg murmur , no gallop  , + HR 132                              no rub, nl s1 s2                          - JVD- none , edema+2 R>L calf, stasis changes- none, varices- none           Lung-   unlabored, +minimal wheeze, Cough-none , dullness-none, rub- none           Chest wall-  Abd-  Br/ Gen/ Rectal- Not done, not indicated Extrem- cyanosis- none, clubbing, none, atrophy- none, strength- nl Neuro- +slight resting tremor

## 2020-05-10 NOTE — Patient Instructions (Addendum)
Order- lab- CBC w diff, BMET, BNP, d-dimer,   Dyspnea, CHF  Order- CXR   Dx Asthma exacerbation  Order- schedule limited CT max fac/ sinuses     Dx pansinusitis  Sample x 2 Beztri inhaler    Inhale 2 puffs then rinse mouth well, twice daily See if this begins to help your breathing  Please call as needed  Order- DME [employer] walker with seat   Dx dyspnea on exertion

## 2020-05-11 ENCOUNTER — Ambulatory Visit (INDEPENDENT_AMBULATORY_CARE_PROVIDER_SITE_OTHER)
Admission: RE | Admit: 2020-05-11 | Discharge: 2020-05-11 | Disposition: A | Discharge status: Home / Self Care | Payer: Medicare PPO | Source: Ambulatory Visit | Attending: Internal Medicine | Admitting: Internal Medicine

## 2020-05-11 ENCOUNTER — Other Ambulatory Visit (INDEPENDENT_AMBULATORY_CARE_PROVIDER_SITE_OTHER): Payer: Medicare PPO

## 2020-05-11 DIAGNOSIS — J45901 Unspecified asthma with (acute) exacerbation: Secondary | ICD-10-CM | POA: Diagnosis not present

## 2020-05-11 DIAGNOSIS — I517 Cardiomegaly: Secondary | ICD-10-CM | POA: Diagnosis not present

## 2020-05-11 DIAGNOSIS — I509 Heart failure, unspecified: Secondary | ICD-10-CM | POA: Diagnosis not present

## 2020-05-11 DIAGNOSIS — R0600 Dyspnea, unspecified: Secondary | ICD-10-CM

## 2020-05-11 DIAGNOSIS — R0602 Shortness of breath: Secondary | ICD-10-CM | POA: Diagnosis not present

## 2020-05-11 DIAGNOSIS — J9811 Atelectasis: Secondary | ICD-10-CM | POA: Diagnosis not present

## 2020-05-11 DIAGNOSIS — J90 Pleural effusion, not elsewhere classified: Secondary | ICD-10-CM | POA: Diagnosis not present

## 2020-05-11 LAB — BASIC METABOLIC PANEL
BUN: 18 mg/dL (ref 6–23)
CO2: 28 mEq/L (ref 19–32)
Calcium: 9.1 mg/dL (ref 8.4–10.5)
Chloride: 104 mEq/L (ref 96–112)
Creatinine, Ser: 1.36 mg/dL — ABNORMAL HIGH (ref 0.40–1.20)
GFR: 37.04 mL/min — ABNORMAL LOW (ref >60.00)
Glucose, Bld: 99 mg/dL (ref 70–99)
Potassium: 3.5 mEq/L (ref 3.5–5.1)
Sodium: 141 mEq/L (ref 135–145)

## 2020-05-11 LAB — CBC WITH DIFFERENTIAL/PLATELET
Basophils Absolute: 0 10*3/uL (ref 0.0–0.1)
Basophils Relative: 0.7 % (ref 0.0–3.0)
Eosinophils Absolute: 0.1 10*3/uL (ref 0.0–0.7)
Eosinophils Relative: 1 % (ref 0.0–5.0)
HCT: 36.9 % (ref 36.0–46.0)
Hemoglobin: 12.3 g/dL (ref 12.0–15.0)
Lymphocytes Relative: 16.5 % (ref 12.0–46.0)
Lymphs Abs: 1 10*3/uL (ref 0.7–4.0)
MCHC: 33.3 g/dL (ref 30.0–36.0)
MCV: 91.4 fl (ref 78.0–100.0)
Monocytes Absolute: 0.5 10*3/uL (ref 0.1–1.0)
Monocytes Relative: 9.1 % (ref 3.0–12.0)
Neutro Abs: 4.3 10*3/uL (ref 1.4–7.7)
Neutrophils Relative %: 72.7 % (ref 43.0–77.0)
Platelets: 126 10*3/uL — ABNORMAL LOW (ref 150.0–400.0)
RBC: 4.04 Mil/uL (ref 3.87–5.11)
RDW: 14.9 % (ref 11.5–15.5)
WBC: 5.9 10*3/uL (ref 4.0–10.5)

## 2020-05-11 LAB — BRAIN NATRIURETIC PEPTIDE: Pro B Natriuretic peptide (BNP): 437 pg/mL — ABNORMAL HIGH (ref 0.0–100.0)

## 2020-05-12 LAB — D-DIMER, QUANTITATIVE: D-Dimer, Quant: 1.13 mcg/mL FEU — ABNORMAL HIGH (ref <0.50)

## 2020-05-13 NOTE — Assessment & Plan Note (Signed)
Contues followup with cardiology, including management of diuretics.

## 2020-05-13 NOTE — Assessment & Plan Note (Signed)
Question if worsening symptoms reflect COPD exacerbation vs CHF. Timing raises question of Covid vaccine reaction, but very nonspecific. She is reluctant to take a booster. Plan- CXR, CBC w diff, BNP, Ddimer(on warfarin), BMET

## 2020-05-13 NOTE — Assessment & Plan Note (Signed)
Complains of postnasal drip. Question recurrent sinusitis. [place]- CT sinuses

## 2020-05-16 ENCOUNTER — Ambulatory Visit (INDEPENDENT_AMBULATORY_CARE_PROVIDER_SITE_OTHER): Payer: Medicare PPO | Admitting: Pharmacist

## 2020-05-16 ENCOUNTER — Other Ambulatory Visit: Payer: Self-pay

## 2020-05-16 ENCOUNTER — Telehealth: Payer: Self-pay

## 2020-05-16 DIAGNOSIS — I4891 Unspecified atrial fibrillation: Secondary | ICD-10-CM | POA: Diagnosis not present

## 2020-05-16 DIAGNOSIS — I482 Chronic atrial fibrillation, unspecified: Secondary | ICD-10-CM

## 2020-05-16 DIAGNOSIS — Z5181 Encounter for therapeutic drug level monitoring: Secondary | ICD-10-CM

## 2020-05-16 LAB — POCT INR: INR: 1 — AB (ref 2.0–3.0)

## 2020-05-16 MED ORDER — BREZTRI AEROSPHERE 160-9-4.8 MCG/ACT IN AERO: 2.0000 | INHALATION_SPRAY | Freq: Two times a day (BID) | RESPIRATORY_TRACT | 5 refills | Status: AC

## 2020-05-16 NOTE — Patient Instructions (Addendum)
Description   Take 2 tablets today and tomorrow then continue 1 tablet on Monday/Tuesday/Thursday/Friday and Saturday and 1 and 1/2 tablets on Sunday and Wednesdays.  Please call with any questions: [phone]

## 2020-05-17 ENCOUNTER — Inpatient Hospital Stay: Admission: RE | Admit: 2020-05-17 | Payer: Medicare PPO | Source: Ambulatory Visit

## 2020-05-19 DIAGNOSIS — S32591A Other specified fracture of right pubis, initial encounter for closed fracture: Secondary | ICD-10-CM | POA: Diagnosis not present

## 2020-05-22 ENCOUNTER — Other Ambulatory Visit: Payer: Self-pay | Admitting: Adult Health

## 2020-05-22 ENCOUNTER — Telehealth: Payer: Self-pay | Admitting: *Deleted

## 2020-05-22 NOTE — Telephone Encounter (Signed)
Called and spoke to niece Angie.  Pt to continue warfarin 5mg  daily except 7.5mg  on Sundays and Wednesdays until INR appt on Monday 05/28/20.  She verbalized understanding and appreciated call back.

## 2020-05-22 NOTE — Telephone Encounter (Signed)
Patient called stating that she has to re-schedule her appointment until next Monday. States that she needs directions on how to take coumdin until appointment.

## 2020-05-25 ENCOUNTER — Other Ambulatory Visit: Payer: Self-pay

## 2020-05-25 ENCOUNTER — Ambulatory Visit (INDEPENDENT_AMBULATORY_CARE_PROVIDER_SITE_OTHER)
Admission: RE | Admit: 2020-05-25 | Discharge: 2020-05-25 | Disposition: A | Discharge status: Home / Self Care | Payer: Medicare PPO | Source: Ambulatory Visit | Attending: Internal Medicine | Admitting: Internal Medicine

## 2020-05-25 DIAGNOSIS — R519 Headache, unspecified: Secondary | ICD-10-CM | POA: Diagnosis not present

## 2020-05-25 DIAGNOSIS — J324 Chronic pansinusitis: Secondary | ICD-10-CM | POA: Diagnosis not present

## 2020-05-28 ENCOUNTER — Ambulatory Visit (INDEPENDENT_AMBULATORY_CARE_PROVIDER_SITE_OTHER): Payer: Medicare PPO | Admitting: *Deleted

## 2020-05-28 DIAGNOSIS — I482 Chronic atrial fibrillation, unspecified: Secondary | ICD-10-CM

## 2020-05-28 DIAGNOSIS — Z5181 Encounter for therapeutic drug level monitoring: Secondary | ICD-10-CM

## 2020-05-28 LAB — POCT INR: INR: 3.5 — AB (ref 2.0–3.0)

## 2020-05-28 NOTE — Patient Instructions (Signed)
Hold warfarin tonight then decrease dose to 1 tablet daily.  Please call with any questions: [phone] Recheck in 2 weeks

## 2020-05-29 ENCOUNTER — Other Ambulatory Visit: Payer: Self-pay | Admitting: *Deleted

## 2020-05-29 MED ORDER — LEVOFLOXACIN 750 MG PO TABS: 750.0000 mg | ORAL_TABLET | Freq: Every day | ORAL | 0 refills | Status: DC

## 2020-06-06 DIAGNOSIS — J209 Acute bronchitis, unspecified: Secondary | ICD-10-CM | POA: Diagnosis not present

## 2020-06-06 DIAGNOSIS — N1831 Chronic kidney disease, stage 3a: Secondary | ICD-10-CM | POA: Diagnosis not present

## 2020-06-06 DIAGNOSIS — J9801 Acute bronchospasm: Secondary | ICD-10-CM | POA: Diagnosis not present

## 2020-06-06 DIAGNOSIS — Z6824 Body mass index (BMI) 24.0-24.9, adult: Secondary | ICD-10-CM | POA: Diagnosis not present

## 2020-06-06 DIAGNOSIS — E1129 Type 2 diabetes mellitus with other diabetic kidney complication: Secondary | ICD-10-CM | POA: Diagnosis not present

## 2020-06-06 DIAGNOSIS — I10 Essential (primary) hypertension: Secondary | ICD-10-CM | POA: Diagnosis not present

## 2020-06-06 DIAGNOSIS — J45901 Unspecified asthma with (acute) exacerbation: Secondary | ICD-10-CM | POA: Diagnosis not present

## 2020-06-06 DIAGNOSIS — Z23 Encounter for immunization: Secondary | ICD-10-CM | POA: Diagnosis not present

## 2020-06-11 ENCOUNTER — Ambulatory Visit (INDEPENDENT_AMBULATORY_CARE_PROVIDER_SITE_OTHER): Payer: Medicare PPO | Admitting: *Deleted

## 2020-06-11 DIAGNOSIS — Z5181 Encounter for therapeutic drug level monitoring: Secondary | ICD-10-CM | POA: Diagnosis not present

## 2020-06-11 DIAGNOSIS — I482 Chronic atrial fibrillation, unspecified: Secondary | ICD-10-CM

## 2020-06-11 LAB — POCT INR: INR: 3.9 — AB (ref 2.0–3.0)

## 2020-06-11 NOTE — Patient Instructions (Signed)
Hold warfarin tonight then decrease dose to 1 tablet daily except 1/2 tablet on Tuesdays and Fridays.  Please call with any questions: [phone] Recheck in 2 weeks

## 2020-06-28 ENCOUNTER — Ambulatory Visit (INDEPENDENT_AMBULATORY_CARE_PROVIDER_SITE_OTHER): Payer: Medicare PPO | Admitting: Pharmacist

## 2020-06-28 ENCOUNTER — Other Ambulatory Visit: Payer: Self-pay

## 2020-06-28 DIAGNOSIS — Z5181 Encounter for therapeutic drug level monitoring: Secondary | ICD-10-CM | POA: Diagnosis not present

## 2020-06-28 DIAGNOSIS — I4891 Unspecified atrial fibrillation: Secondary | ICD-10-CM | POA: Diagnosis not present

## 2020-06-28 LAB — POCT INR: INR: 2.5 (ref 2.0–3.0)

## 2020-06-28 NOTE — Patient Instructions (Signed)
Description   Continue 1/2 tablet on Tuesdays and Fridays, skip dose on Mondays and take 1 tablet the rest of the week

## 2020-07-10 ENCOUNTER — Telehealth: Payer: Self-pay | Admitting: Cardiology

## 2020-07-10 ENCOUNTER — Ambulatory Visit: Payer: Medicare PPO | Admitting: Internal Medicine

## 2020-07-10 MED ORDER — WARFARIN SODIUM 5 MG PO TABS: 5.0000 mg | ORAL_TABLET | Freq: Every day | ORAL | 0 refills | Status: DC

## 2020-07-10 NOTE — Telephone Encounter (Signed)
New message      *STAT* If patient is at the pharmacy, call can be transferred to refill team.   1. Which medications need to be refilled? (please list name of each medication and dose if known) warafrin   2. Which pharmacy/location (including street and city if local pharmacy) is medication to be sent to? [facility] [place]   3. Do they need a 30 day or 90 day supply? [place]

## 2020-08-04 ENCOUNTER — Other Ambulatory Visit: Payer: Self-pay | Admitting: Cardiology

## 2020-08-08 ENCOUNTER — Ambulatory Visit (INDEPENDENT_AMBULATORY_CARE_PROVIDER_SITE_OTHER): Payer: Medicare PPO | Admitting: *Deleted

## 2020-08-08 DIAGNOSIS — I482 Chronic atrial fibrillation, unspecified: Secondary | ICD-10-CM

## 2020-08-08 DIAGNOSIS — Z5181 Encounter for therapeutic drug level monitoring: Secondary | ICD-10-CM | POA: Diagnosis not present

## 2020-08-08 LAB — POCT INR: INR: 2.3 (ref 2.0–3.0)

## 2020-08-08 NOTE — Patient Instructions (Signed)
Dose left the same but days changed. Take warfarin 1/2 tablet daily except 1 tablet on Sundays, Tuesdays and Thursdays Recheck in 4 wks

## 2020-08-22 ENCOUNTER — Ambulatory Visit (HOSPITAL_COMMUNITY)
Admission: RE | Admit: 2020-08-22 | Discharge: 2020-08-22 | Disposition: A | Discharge status: Home / Self Care | Payer: Medicare PPO | Source: Ambulatory Visit | Attending: Cardiology | Admitting: Cardiology

## 2020-08-22 ENCOUNTER — Other Ambulatory Visit: Payer: Self-pay

## 2020-08-22 DIAGNOSIS — E785 Hyperlipidemia, unspecified: Secondary | ICD-10-CM | POA: Insufficient documentation

## 2020-08-22 DIAGNOSIS — I428 Other cardiomyopathies: Secondary | ICD-10-CM | POA: Diagnosis not present

## 2020-08-22 DIAGNOSIS — Z87891 Personal history of nicotine dependence: Secondary | ICD-10-CM | POA: Diagnosis not present

## 2020-08-22 DIAGNOSIS — Z8679 Personal history of other diseases of the circulatory system: Secondary | ICD-10-CM | POA: Insufficient documentation

## 2020-08-22 DIAGNOSIS — I429 Cardiomyopathy, unspecified: Secondary | ICD-10-CM | POA: Insufficient documentation

## 2020-08-22 DIAGNOSIS — I080 Rheumatic disorders of both mitral and aortic valves: Secondary | ICD-10-CM | POA: Diagnosis not present

## 2020-08-22 DIAGNOSIS — I342 Nonrheumatic mitral (valve) stenosis: Secondary | ICD-10-CM

## 2020-08-22 DIAGNOSIS — K219 Gastro-esophageal reflux disease without esophagitis: Secondary | ICD-10-CM | POA: Diagnosis not present

## 2020-08-22 DIAGNOSIS — J449 Chronic obstructive pulmonary disease, unspecified: Secondary | ICD-10-CM | POA: Diagnosis not present

## 2020-08-22 DIAGNOSIS — I340 Nonrheumatic mitral (valve) insufficiency: Secondary | ICD-10-CM

## 2020-08-22 LAB — ECHOCARDIOGRAM COMPLETE
AR max vel: 1.63 cm2
AV Area VTI: 1.91 cm2
AV Area mean vel: 1.57 cm2
AV Mean grad: 7.7 mmHg
AV Peak grad: 13.5 mmHg
Ao pk vel: 1.83 m/s
MV M vel: 5.08 m/s
MV Peak grad: 103.2 mmHg
S' Lateral: 2.9 cm

## 2020-08-22 NOTE — Progress Notes (Signed)
*  PRELIMINARY RESULTS* Echocardiogram 2D Echocardiogram has been performed.  [name] 08/22/2020, 12:37 PM

## 2020-08-23 ENCOUNTER — Telehealth: Payer: Self-pay | Admitting: *Deleted

## 2020-08-23 NOTE — Telephone Encounter (Signed)
-----   Message from Satira Sark, MD sent at 08/22/2020  4:57 PM EST ----- Results reviewed.  LVEF remains normal at 55 to 60% and with significant LVH, normal RV contraction.  Mitral valve disease noted and consistent with history.  Mitral regurgitation mild and mitral stenosis moderate to severe range.  We will continue with medical therapy.

## 2020-08-23 NOTE — Telephone Encounter (Signed)
Patient informed. 

## 2020-08-29 NOTE — Progress Notes (Signed)
Cardiology Office Note  Date: 08/30/2020   ID: TOBIN CADIENTE, DOB Feb 03, 1934, MRN [id number]  PCP:  Redmond School, MD  Cardiologist:  [name], MD Electrophysiologist:  None   Chief Complaint  Patient presents with  . Cardiac follow-up    History of Present Illness: [name] is an 86 y.o. female last seen in June.  She presents for a routine visit.  Reports no progressive sense of palpitations with stable dyspnea on exertion.  She is on Coumadin with follow-up in the anticoagulation clinic.  Last INR was 2.3.  She does not report any spontaneous bleeding problems.  Recent follow-up echocardiogram revealed LVEF 55 to 60% with severe LVH, normal RV contraction, severe left atrial enlargement, moderate to severe mitral stenosis with mean gradient 10 mmHg, and sclerotic aortic valve with mild aortic regurgitation.  I reviewed her medications which are outlined below.  She does have worsening leg swelling over the last several weeks, right greater than left.  Her weight is up about 10 pounds as well.  She has been on Lasix 40 mg daily with potassium supplement.  Past Medical History:  Diagnosis Date  . Asthma   . Atrial fibrillation ([place])   . Chronic rhinitis   . COPD (chronic obstructive pulmonary disease) ([place])   . GERD (gastroesophageal reflux disease)   . Mitral regurgitation   . Mitral valve prolapse   . Nasal polyps   . Nonischemic cardiomyopathy ([facility])    LVEF 25-30% improved to 45-50%  . Obstructive sleep apnea   . Transudative pleural effusion     Past Surgical History:  Procedure Laterality Date  . APPENDECTOMY  2000  . Bilateral cataract surgery    . LAPAROSCOPIC NISSEN FUNDOPLICATION  8295  . NOSE SURGERY      Current Outpatient Medications  Medication Sig Dispense Refill  . acetaminophen (TYLENOL) 325 MG tablet Take 2 tablets (650 mg total) by mouth every 6 (six) hours as needed for mild pain, fever or headache.    . albuterol  (PROVENTIL) (2.5 MG/3ML) 0.083% nebulizer solution Take 3 mLs (2.5 mg total) by nebulization every 6 (six) hours as needed for wheezing or shortness of breath. 75 mL 12  . albuterol (VENTOLIN HFA) 108 (90 Base) MCG/ACT inhaler Inhale 2 puffs into the lungs every 6 (six) hours as needed for wheezing or shortness of breath. 6.7 g 5  . Budeson-Glycopyrrol-Formoterol (BREZTRI AEROSPHERE) 160-9-4.8 MCG/ACT AERO Inhale 2 puffs into the lungs in the morning and at bedtime. 10.7 g 5  . digoxin (LANOXIN) 0.125 MG tablet TAKE 1 TABLET EVERY DAY 90 tablet 1  . diltiazem (CARDIZEM CD) 240 MG 24 hr capsule TAKE 1 CAPSULE BY MOUTH EVERY DAY 90 capsule 3  . docusate sodium (COLACE) 100 MG capsule Take 100 mg by mouth 2 (two) times daily.    Marland Kitchen escitalopram (LEXAPRO) 10 MG tablet Take 1 tablet (10 mg total) by mouth every morning. 30 tablet 0  . esomeprazole (NEXIUM) 40 MG capsule Take 1 capsule (40 mg total) by mouth 2 (two) times daily. 60 capsule 0  . furosemide (LASIX) 40 MG tablet Take 1 tablet (40 mg total) by mouth daily. 90 tablet 3  . NON FORMULARY Diet- Regular    . phenylephrine-shark liver oil-mineral oil-petrolatum (PREPARATION H) 0.25-3-14-71.9 % rectal ointment Place 1 application rectally 2 (two) times daily as needed for hemorrhoids.    . polyethylene glycol (MIRALAX / GLYCOLAX) 17 g packet Take 17 g by mouth daily. 14 each  0  . potassium chloride SA (K-DUR) 20 MEQ tablet Take 1 tablet (20 mEq total) by mouth daily. 30 tablet 0  . warfarin (COUMADIN) 5 MG tablet Take 1 tablet daily except 1/2 tablet on Tuesdays and Fridays 30 tablet 4   No current facility-administered medications for this visit.   Allergies:  Amoxicillin-pot clavulanate, Clarithromycin, Clindamycin, Doxycycline, and Penicillins   ROS: No sudden dizziness or syncope.  Physical Exam: VS:  BP 114/66   Pulse 70   Ht 5\' 6"  (1.676 m)   Wt 166 lb (75.3 kg)   SpO2 95%   BMI 26.79 kg/m , BMI Body mass index is 26.79 kg/m.  Wt  Readings from Last 3 Encounters:  08/30/20 166 lb (75.3 kg)  05/10/20 155 lb 12.8 oz (70.7 kg)  02/27/20 158 lb (71.7 kg)    General: Elderly woman, appears comfortable at rest. HEENT: Conjunctiva and lids normal, wearing a mask. Neck: Supple, no elevated JVP or carotid bruits, no thyromegaly. Lungs: Decreased breath sounds with no active wheezing, nonlabored breathing at rest. Cardiac: Irregularly irregular, no gallop, soft diastolic murmur. Extremities: 2-3+ leg edema, right worse than left.  ECG:  An ECG dated 02/27/2020 was personally reviewed today and demonstrated:  Rate controlled atrial fibrillation with rightward axis, PVC and low voltage.  Recent Labwork: 05/11/2020: BUN 18; Creatinine, Ser 1.36; Hemoglobin 12.3; Platelets 126.0; Potassium 3.5; Pro B Natriuretic peptide (BNP) 437.0; Sodium 141   Other Studies Reviewed Today:  Echocardiogram 08/22/2020: 1. Left ventricular ejection fraction, by estimation, is 55 to 60%. The  left ventricle has normal function. The left ventricle has no regional  wall motion abnormalities. There is severe left ventricular hypertrophy.  Left ventricular diastolic parameters  are indeterminate.  2. Right ventricular systolic function is normal. The right ventricular  size is normal.  3. Left atrial size was severely dilated.  4. Right atrial size was moderately dilated.  5. The mitral valve is abnormal. Mild mitral valve regurgitation.  Moderate to severe mitral stenosis. Severe mitral annular calcification.  The mean mitral valve gradient is 10.1 mmHg with average heart rate of 54  bpm.  6. The aortic valve is tricuspid. There is mild calcification of the  aortic valve. There is mild thickening of the aortic valve. Aortic valve  regurgitation is mild. No aortic stenosis is present.  7. The inferior vena cava is normal in size with greater than 50%  respiratory variability, suggesting right atrial pressure of 3 mmHg.   Assessment and  Plan:  1.  Permanent atrial fibrillation.  CHA2DS2-VASc score is at least 4.  Continue Coumadin with follow-up in the anticoagulation clinic.  Heart rate control has been adequate on combination of Cardizem CD and Lanoxin.  No changes were made today.  2.  Moderate to severe mitral stenosis and mild aortic regurgitation.  Recent echocardiogram reviewed.  3.  Fluid gain likely due to diastolic dysfunction and valvular heart disease.  Plan to double Lasix with potassium supplement for the next 3 days and then return to prior dosing.  Going forward we will either need to intermittently uptitrate therapy or consider a higher standing dose depending on how she does clinically.  Medication Adjustments/Labs and Tests Ordered: Current medicines are reviewed at length with the patient today.  Concerns regarding medicines are outlined above.   Tests Ordered: No orders of the defined types were placed in this encounter.   Medication Changes: No orders of the defined types were placed in this encounter.   Disposition:  Follow up 6 months in the [place] office.  Signed, Satira Sark, MD, [facility] 08/30/2020 2:07 PM    [place] [facility] [address] [place], [place] [address] Phone: [phone]; Fax: [phone]

## 2020-08-30 ENCOUNTER — Ambulatory Visit: Payer: Medicare PPO | Admitting: Cardiology

## 2020-08-30 ENCOUNTER — Other Ambulatory Visit: Payer: Self-pay

## 2020-08-30 ENCOUNTER — Encounter: Payer: Self-pay | Admitting: Cardiology

## 2020-08-30 VITALS — BP 114/66 | HR 70 | Ht 66.0 in | Wt 166.0 lb

## 2020-08-30 DIAGNOSIS — I38 Endocarditis, valve unspecified: Secondary | ICD-10-CM | POA: Diagnosis not present

## 2020-08-30 DIAGNOSIS — I4821 Permanent atrial fibrillation: Secondary | ICD-10-CM | POA: Diagnosis not present

## 2020-08-30 NOTE — Patient Instructions (Signed)
Medication Instructions:  INCREASE your Lasix and Potassium doses to double for the next 3 days,(80 mg Lasix and 20 meq potassium )  then go back to usual daily dose.  *If you need a refill on your cardiac medications before your next appointment, please call your pharmacy*   Lab Work: None today If you have labs (blood work) drawn today and your tests are completely normal, you will receive your results only by:  [place] (if you have MyChart) OR  A paper copy in the mail If you have any lab test that is abnormal or we need to change your treatment, we will call you to review the results.   Testing/Procedures: None today   Follow-Up: At [facility], you and your health needs are our priority.  As part of our continuing mission to provide you with exceptional heart care, we have created designated Provider Care Teams.  These Care Teams include your primary Cardiologist (physician) and Advanced Practice Providers (APPs -  Physician Assistants and Nurse Practitioners) who all work together to provide you with the care you need, when you need it.  We recommend signing up for the patient portal called "MyChart".  Sign up information is provided on this After Visit Summary.  MyChart is used to connect with patients for Virtual Visits (Telemedicine).  Patients are able to view lab/test results, encounter notes, upcoming appointments, etc.  Non-urgent messages can be sent to your provider as well.   To learn more about what you can do with MyChart, go to [email or web address].    Your next appointment:   6 month(s)  The format for your next appointment:   In Person  Provider:   [name], MD   Other Instructions None      Thank you for choosing [facility] !

## 2020-09-05 ENCOUNTER — Ambulatory Visit (INDEPENDENT_AMBULATORY_CARE_PROVIDER_SITE_OTHER): Payer: Medicare PPO | Admitting: Pharmacist

## 2020-09-05 ENCOUNTER — Other Ambulatory Visit: Payer: Self-pay

## 2020-09-05 DIAGNOSIS — Z5181 Encounter for therapeutic drug level monitoring: Secondary | ICD-10-CM

## 2020-09-05 DIAGNOSIS — I482 Chronic atrial fibrillation, unspecified: Secondary | ICD-10-CM | POA: Diagnosis not present

## 2020-09-05 LAB — POCT INR: INR: 1.3 — AB (ref 2.0–3.0)

## 2020-09-05 NOTE — Patient Instructions (Signed)
Description   Take 1 tablet today and 1 and 1/2 tablets tomorrow then continue taking 1/2 tablet Monday/Wednesday/Friday and Saturday and 1 tablet on Sunday/Tuesday and Thursday.  Recheck in 2 wks

## 2020-09-13 ENCOUNTER — Other Ambulatory Visit: Payer: Self-pay

## 2020-09-13 ENCOUNTER — Encounter: Payer: Self-pay | Admitting: Internal Medicine

## 2020-09-13 ENCOUNTER — Ambulatory Visit: Payer: Medicare PPO | Admitting: Internal Medicine

## 2020-09-13 DIAGNOSIS — J449 Chronic obstructive pulmonary disease, unspecified: Secondary | ICD-10-CM | POA: Diagnosis not present

## 2020-09-13 DIAGNOSIS — I4891 Unspecified atrial fibrillation: Secondary | ICD-10-CM | POA: Diagnosis not present

## 2020-09-13 NOTE — Patient Instructions (Signed)
We can continue current meds  Please cal if we can help

## 2020-09-13 NOTE — Progress Notes (Signed)
Patient ID: [name], female    DOB: 03/19/1934, 87 y.o.   MRN: [id number]  HPI F former smoker, followed for Chronic asthma/ COPD/ Xolair, chronic rhinosinusitis, complicated by hx AFib/ anticoagulation, CHF, cardiomyopathy, mitral insufficiency, GERD, CAD Echocardiogram-atrial fibrillation, LVEF 30-35%, diffuse hypokinesis  ---------------------------------------------------------------------------------------------------------  05/10/20- 87 year old female former smoker followed for chronic asthma/COPD, chronic rhinosinusitis, lung nodules complicated by history A. Fib/anticoagulation/ coumadin, CHF, cardiomyopathy, mitral insufficiency, GERD Barrett's, CAD -----Pt states that she has had more problems with worsening SOB. Pt denies any complaints of cough but states that she does have postnasal drainage. Had 2 Moderna Covax We sent pred taper 8/16 Neb albuterol, Ventolin hfa,  The day after her second Moderna Covax she states she became more SOB, wheeze, increased PN drip with increased ankle edema. Initially blamed allergy. PCP gave prednisone which helped.Repeated pred and ?augmentin in July. Last prednisone from [place] ended 1 week ago. Insurance sent out [facility].  Dr Domenic Polite Cardiology doubled lasix which helped edema, with dose now back as before.   09/13/20- 87 year old female former smoker followed for chronic asthma/COPD, chronic rhinosinusitis, lung nodules complicated by history A. Fib/anticoagulation/ coumadin, CHF, cardiomyopathy, mitral stenosis, GERD Barrett's, CAD Meds adjusted in December by Dr Domenic Polite for leg swelling. Neb albuterol, Ventolin hfa, Breztri,  Covid vax- 3 Moderna Flu vax had Blames arthritis for weakness in her neck, harder to hold her head up but nothing acute.  Cardiology increased her lasix but leg edema not yet improved.  Echo-Mitral regurg, EF 50-55%. CXR 05/11/20- IMPRESSION: Small bilateral pleural effusions.  No frank interstitial  edema. CT Sinuses 05/25/20- IMPRESSION: 1. Progressed chronic right maxillary sinus disease since last year. Previous bilateral maxillary antrostomies. 2. But other paranasal sinuses are well pneumatized.  Review of Systems-see HPI   + = positive Constitutional:    weight loss, night sweats, fevers, chills, +fatigue, lassitude. HEENT:   No-  headaches, difficulty swallowing, tooth/dental problems, sore throat,  hoarseness      No-  sneezing, itching, ear ache, no- nasal congestion, +post nasal drip,  CV:  No- chest pain, no-orthopnea, PND, +swelling in lower extremities, anasarca, dizziness, palpitations Resp: +   shortness of breath with exertion or at rest.              No-   productive cough,  non-productive cough,  No- coughing up of blood.              No-   change in color of mucus.  wheezing.   Skin: No-   rash or lesions. GI:  No-   heartburn, indigestion, abdominal pain, nausea, vomiting,  GU: . MS:  No-   joint pain or swelling.  Neuro-     nothing unusual Psych:  No- change in mood or affect. No depression or anxiety.  No memory loss.  Objective:   Physical Exam General- Alert, Oriented, Affect-appropriate/ cheerful, Distress- none acute, thin Skin- no rash visible now on right lateral ribs Lymphadenopathy- none Head- atraumatic            Eyes- Gross vision intact, PERRLA, +conjunctivae injected            Ears- Hearing, canals-normal            Nose- clear, turbinate edema, no-Septal dev, mucus, polyps, erosion, perforation             Throat- Mallampati III , mucosa clear/ not red , drainage- none, tonsils- atrophic,  Dentures. +hoarseness not worse .                     +  Large torus hard palate Neck- flexible , trachea midline, no stridor , thyroid nl, carotid no bruit Chest - symmetrical excursion , unlabored           Heart/CV- RRR today, + faint  Mitral regurg murmur , no gallop  ,                               no rub, nl s1 s2                         - JVD-  none , edema+2 R>L calf, stasis changes- none, varices- none           Lung-   unlabored,  Wheeze- none, Cough-none , dullness-none, rub- none           Chest wall-  Abd-  Br/ Gen/ Rectal- Not done, not indicated Extrem- cyanosis- none, clubbing, none, atrophy- none, strength- nl Neuro- +slight resting tremor

## 2020-09-14 NOTE — Assessment & Plan Note (Signed)
Little reactive airways component at this visit. She is really doing fairly well, recognizing age and multiple problems. Plan- no basis for changing current resp meds

## 2020-09-14 NOTE — Assessment & Plan Note (Signed)
Followed by cardiology.  She is either NSR or very well controlled AFib today

## 2020-09-19 ENCOUNTER — Ambulatory Visit (INDEPENDENT_AMBULATORY_CARE_PROVIDER_SITE_OTHER): Payer: Medicare PPO | Admitting: *Deleted

## 2020-09-19 DIAGNOSIS — Z5181 Encounter for therapeutic drug level monitoring: Secondary | ICD-10-CM

## 2020-09-19 DIAGNOSIS — I482 Chronic atrial fibrillation, unspecified: Secondary | ICD-10-CM

## 2020-09-19 LAB — POCT INR: INR: 1.2 — AB (ref 2.0–3.0)

## 2020-09-19 NOTE — Patient Instructions (Signed)
Increase warfarin to 1 tablet daily except 1/2 tablet on Tuesdays and Saturdays Recheck in 1 wk

## 2020-09-26 ENCOUNTER — Ambulatory Visit (INDEPENDENT_AMBULATORY_CARE_PROVIDER_SITE_OTHER): Payer: Medicare PPO | Admitting: *Deleted

## 2020-09-26 DIAGNOSIS — I482 Chronic atrial fibrillation, unspecified: Secondary | ICD-10-CM

## 2020-09-26 DIAGNOSIS — Z5181 Encounter for therapeutic drug level monitoring: Secondary | ICD-10-CM

## 2020-09-26 LAB — POCT INR: INR: 1.6 — AB (ref 2.0–3.0)

## 2020-09-26 NOTE — Patient Instructions (Signed)
Increase warfarin to 1 tablet daily  Recheck in 1 wk

## 2020-10-10 ENCOUNTER — Ambulatory Visit (INDEPENDENT_AMBULATORY_CARE_PROVIDER_SITE_OTHER): Payer: Medicare PPO | Admitting: *Deleted

## 2020-10-10 DIAGNOSIS — Z5181 Encounter for therapeutic drug level monitoring: Secondary | ICD-10-CM | POA: Diagnosis not present

## 2020-10-10 DIAGNOSIS — I482 Chronic atrial fibrillation, unspecified: Secondary | ICD-10-CM | POA: Diagnosis not present

## 2020-10-10 LAB — POCT INR: INR: 1.7 — AB (ref 2.0–3.0)

## 2020-10-10 NOTE — Patient Instructions (Signed)
Increase warfarin to 1 tablet daily except 1 1/2 tablets on Wednesdays and Saturdays ?Recheck in 2 wks ?

## 2020-10-11 ENCOUNTER — Other Ambulatory Visit: Payer: Self-pay | Admitting: Cardiology

## 2020-10-24 ENCOUNTER — Ambulatory Visit (INDEPENDENT_AMBULATORY_CARE_PROVIDER_SITE_OTHER): Payer: Medicare PPO | Admitting: *Deleted

## 2020-10-24 DIAGNOSIS — I482 Chronic atrial fibrillation, unspecified: Secondary | ICD-10-CM

## 2020-10-24 DIAGNOSIS — Z5181 Encounter for therapeutic drug level monitoring: Secondary | ICD-10-CM

## 2020-10-24 LAB — POCT INR: INR: 3.3 — AB (ref 2.0–3.0)

## 2020-10-24 NOTE — Patient Instructions (Signed)
Take warfarin 1/2 tablet tonight then decrease dose to 1 tablet daily Recheck in 3 wks

## 2020-10-29 ENCOUNTER — Encounter: Payer: Self-pay | Admitting: Orthopedic Surgery

## 2020-10-29 ENCOUNTER — Ambulatory Visit: Payer: Medicare PPO

## 2020-10-29 ENCOUNTER — Ambulatory Visit: Payer: Medicare PPO | Admitting: Orthopedic Surgery

## 2020-10-29 ENCOUNTER — Other Ambulatory Visit: Payer: Self-pay

## 2020-10-29 VITALS — BP 128/72 | HR 72 | Ht 66.0 in | Wt 154.0 lb

## 2020-10-29 DIAGNOSIS — M542 Cervicalgia: Secondary | ICD-10-CM

## 2020-10-29 NOTE — Patient Instructions (Signed)
Going to PT.

## 2020-10-29 NOTE — Progress Notes (Signed)
NEW PROBLEM//OFFICE VISIT  Summary assessment and plan:   87 year old female with decompensating sagittal balance recommend physical therapy  Chief Complaint  Patient presents with  . Neck Pain    Patient reports she has been worse in the last few  weeks, She had shingles on the left side.     87 year old female comes in saying that she cannot hold her head up.  She says she does fine sitting down but if she stands up her head wants to drift forward  No numbness or tingling no weakness in the upper extremities no history of trauma other than a hip fracture and a skull fracture but that is remote   Review of Systems  Constitutional: Negative for chills and fever.  Neurological: Negative for tingling and weakness.     Past Medical History:  Diagnosis Date  . Asthma   . Atrial fibrillation ([place])   . Chronic rhinitis   . COPD (chronic obstructive pulmonary disease) ([place])   . GERD (gastroesophageal reflux disease)   . Mitral regurgitation   . Mitral valve prolapse   . Nasal polyps   . Nonischemic cardiomyopathy ([facility])    LVEF 25-30% improved to 45-50%  . Obstructive sleep apnea   . Transudative pleural effusion     Past Surgical History:  Procedure Laterality Date  . APPENDECTOMY  2000  . Bilateral cataract surgery    . LAPAROSCOPIC NISSEN FUNDOPLICATION  1914  . NOSE SURGERY      Family History  Problem Relation Age of Onset  . Colon cancer Mother   . Colon cancer Father   . Atrial fibrillation Sister   . Lung cancer Brother   . Lung cancer Brother    Social History   Tobacco Use  . Smoking status: Former Smoker    Packs/day: 0.50    Years: 20.00    Pack years: 10.00    Types: Cigarettes    Quit date: 10/08/1980    Years since quitting: 40.0  . Smokeless tobacco: Never Used  Vaping Use  . Vaping Use: Never used  Substance Use Topics  . Alcohol use: No    Alcohol/week: 0.0 standard drinks  . Drug use: No    Allergies  Allergen Reactions  .  Amoxicillin-Pot Clavulanate Itching and Swelling  . Clarithromycin Itching and Swelling  . Clindamycin Itching and Swelling  . Doxycycline Itching and Swelling  . Penicillins Itching and Swelling    Did it involve swelling of the face/tongue/throat, SOB, or low BP? Yes Did it involve sudden or severe rash/hives, skin peeling, or any reaction on the inside of your mouth or nose? No Did you need to seek medical attention at a hospital or doctor's office? Yes When did it last happen?Over 10 years If all above answers are "NO", may proceed with cephalosporin use.     Current Meds  Medication Sig  . acetaminophen (TYLENOL) 325 MG tablet Take 2 tablets (650 mg total) by mouth every 6 (six) hours as needed for mild pain, fever or headache.  . albuterol (PROVENTIL) (2.5 MG/3ML) 0.083% nebulizer solution Take 3 mLs (2.5 mg total) by nebulization every 6 (six) hours as needed for wheezing or shortness of breath.  Marland Kitchen albuterol (VENTOLIN HFA) 108 (90 Base) MCG/ACT inhaler Inhale 2 puffs into the lungs every 6 (six) hours as needed for wheezing or shortness of breath.  . Budeson-Glycopyrrol-Formoterol (BREZTRI AEROSPHERE) 160-9-4.8 MCG/ACT AERO Inhale 2 puffs into the lungs in the morning and at bedtime.  . digoxin (LANOXIN)  0.125 MG tablet TAKE 1 TABLET EVERY DAY  . diltiazem (CARDIZEM CD) 240 MG 24 hr capsule TAKE 1 CAPSULE BY MOUTH EVERY DAY  . docusate sodium (COLACE) 100 MG capsule Take 100 mg by mouth 2 (two) times daily.  Marland Kitchen escitalopram (LEXAPRO) 10 MG tablet Take 1 tablet (10 mg total) by mouth every morning.  Marland Kitchen esomeprazole (NEXIUM) 40 MG capsule Take 1 capsule (40 mg total) by mouth 2 (two) times daily.  . furosemide (LASIX) 40 MG tablet Take 1 tablet (40 mg total) by mouth daily.  . NON FORMULARY Diet- Regular  . phenylephrine-shark liver oil-mineral oil-petrolatum (PREPARATION H) 0.25-3-14-71.9 % rectal ointment Place 1 application rectally 2 (two) times daily as needed for hemorrhoids.   . polyethylene glycol (MIRALAX / GLYCOLAX) 17 g packet Take 17 g by mouth daily.  . potassium chloride SA (K-DUR) 20 MEQ tablet Take 1 tablet (20 mEq total) by mouth daily.  Marland Kitchen warfarin (COUMADIN) 5 MG tablet Take 1 tablet daily except 1/2 tablet on Tuesdays and Fridays    BP 128/72   Pulse 72   Ht 5\' 6"  (1.676 m)   Wt 154 lb (69.9 kg)   BMI 24.86 kg/m   Physical Exam Constitutional:      General: She is not in acute distress.    Appearance: She is well-developed.     Comments: Well developed, well nourished Normal grooming and hygiene     Cardiovascular:     Comments: No peripheral edema Musculoskeletal:     Comments: Poor sagittal balance patient's head wants to tilt forward no other maladies  Upper extremities normal  Skin:    General: Skin is warm and dry.  Neurological:     Mental Status: She is alert and oriented to person, place, and time.     Sensory: No sensory deficit.     Coordination: Coordination normal.     Gait: Gait normal.     Deep Tendon Reflexes: Reflexes are normal and symmetric.  Psychiatric:        Mood and Affect: Mood normal.        Behavior: Behavior normal.        Thought Content: Thought content normal.        Judgment: Judgment normal.     Comments: Affect normal          MEDICAL DECISION MAKING  A.  Encounter Diagnosis  Name Primary?  . Neck pain Yes    B. DATA ANALYSED:   IMAGING: Interpretation of images: Internal images of the neck show almost horizontal position of the cervical spine disc spaces are somewhat preserved  Orders: Physical therapy Outside records reviewed: none   C. MANAGEMENT   Physical therapy  No orders of the defined types were placed in this encounter.     Arther Abbott, MD  10/29/2020 2:01 PM

## 2020-10-31 ENCOUNTER — Other Ambulatory Visit: Payer: Self-pay | Admitting: Cardiology

## 2020-11-07 ENCOUNTER — Other Ambulatory Visit: Payer: Self-pay

## 2020-11-07 ENCOUNTER — Encounter (HOSPITAL_COMMUNITY): Payer: Self-pay | Admitting: Physical Therapy

## 2020-11-07 ENCOUNTER — Ambulatory Visit (HOSPITAL_COMMUNITY): Payer: Medicare PPO | Attending: Orthopedic Surgery | Admitting: Physical Therapy

## 2020-11-07 DIAGNOSIS — M542 Cervicalgia: Secondary | ICD-10-CM | POA: Diagnosis not present

## 2020-11-07 DIAGNOSIS — R293 Abnormal posture: Secondary | ICD-10-CM

## 2020-11-07 NOTE — Therapy (Signed)
[facility] [address] [place], [place], [address] Phone: [phone]   Fax:  [phone]  Physical Therapy Evaluation  Patient Details  Name: [name] MRN: [id number] Date of Birth: [birth year] Referring Provider (PT): Arther Abbott MD   Encounter Date: 11/07/2020   PT End of Session - 11/07/20 1623    Visit Number 1    Number of Visits 8    Date for PT Re-Evaluation 12/05/20    Authorization Type [employer]    Authorization Time Period check auth    PT Start Time 1520    PT Stop Time 1605    PT Time Calculation (min) 45 min    Activity Tolerance Patient tolerated treatment well    Behavior During Therapy [facility] for tasks assessed/performed           Past Medical History:  Diagnosis Date  . Asthma   . Atrial fibrillation ([place])   . Chronic rhinitis   . COPD (chronic obstructive pulmonary disease) ([place])   . GERD (gastroesophageal reflux disease)   . Mitral regurgitation   . Mitral valve prolapse   . Nasal polyps   . Nonischemic cardiomyopathy (HCC)    LVEF 25-30% improved to 45-50%  . Obstructive sleep apnea   . Transudative pleural effusion     Past Surgical History:  Procedure Laterality Date  . APPENDECTOMY  2000  . Bilateral cataract surgery    . LAPAROSCOPIC NISSEN FUNDOPLICATION  6503  . NOSE SURGERY      There were no vitals filed for this visit.    Subjective Assessment - 11/07/20 1529    Subjective Patient presents to therapy with complaint of neck pain. She says she has had some pain for a few years now. Began before COVID but she stayed in to avoid going out. She feels her neck hurts and feels like it is going forward when she stands. It does not bother her when sitting only standing. She had xrays which showed some curvature in her spine.    Pertinent History arthritis, osteoporosis    Limitations Standing;House hold activities;Lifting    How long can you stand comfortably? 10-15 minutes     Diagnostic tests xrays    Patient Stated Goals feel better    Currently in Pain? Yes    Pain Score 0-No pain    Pain Location Neck    Pain Orientation Posterior    Pain Descriptors / Indicators Burning    Pain Type Chronic pain    Pain Onset More than a month ago    Pain Frequency Intermittent    Aggravating Factors  standing, reaching, lifting    Pain Relieving Factors sitting, lying    Effect of Pain on Daily Activities Limits              [facility] PT Assessment - 11/07/20 0001      Assessment   Medical Diagnosis neck pain    Referring Provider (PT) Arther Abbott MD    Onset Date/Surgical Date --   Chronic   Prior Therapy Not for neck      Balance Screen   Has the patient fallen in the past 6 months No      [place] residence    Living Arrangements Other relatives      Prior Function   Level of Independence Independent      Cognition   Overall Cognitive Status Within Functional Limits for tasks assessed  Observation/Other Assessments   Focus on Therapeutic Outcomes (FOTO)  53% function      Posture/Postural Control   Posture/Postural Control Postural limitations    Postural Limitations Rounded Shoulders;Forward head;Increased thoracic kyphosis      ROM / Strength   AROM / PROM / Strength AROM;Strength      AROM   AROM Assessment Site Cervical    Cervical Flexion 50    Cervical Extension 30    Cervical - Right Rotation 60    Cervical - Left Rotation 60      Strength   Strength Assessment Site Shoulder    Right/Left Shoulder Right;Left    Right Shoulder Flexion 4+/5    Right Shoulder ABduction 4+/5    Right Shoulder Internal Rotation 5/5    Right Shoulder External Rotation 4+/5    Left Shoulder Flexion 4/5    Left Shoulder ABduction 4/5    Left Shoulder Internal Rotation 5/5    Left Shoulder External Rotation 4+/5      Flexibility   Soft Tissue Assessment /Muscle Length --   Mod restriction in bilateral  upper trap flexibility     Palpation   Palpation comment Mod TTP about bilateral upper trap, levator, RT rhomboids                      Objective measurements completed on examination: See above findings.       [facility] Adult PT Treatment/Exercise - 11/07/20 0001      Exercises   Exercises Neck      Neck Exercises: Supine   Neck Retraction 10 reps    Other Supine Exercise scapular retraction x10    Other Supine Exercise prone lying thoracic mob on vertical towel roll 3 minutes                  PT Education - 11/07/20 1532    Education Details on evaluation findings, POC and HEP    Person(s) Educated Patient    Methods Explanation;Handout    Comprehension Verbalized understanding            PT Short Term Goals - 11/07/20 1628      PT SHORT TERM GOAL #1   Title Patient will be independent with initial HEP and self-management strategies to improve functional outcomes    Time 2    Period Weeks    Status New    Target Date 11/21/20             PT Long Term Goals - 11/07/20 1628      PT LONG TERM GOAL #1   Title Patient will improve FOTO score by at least 5% to indicate improvement in functional outcomes    Time 4    Period Weeks    Status New    Target Date 12/05/20      PT LONG TERM GOAL #2   Title Patient improve cervical extension by at least 5 degrees in order to improve ability to scan environment for safety and performing overhead ADLs    Time 4    Period Weeks    Status New    Target Date 12/05/20      PT LONG TERM GOAL #3   Title Patient will report at least 70% overall improvement in subjective complaint to indicate improvement in ability to perform ADLs.    Time 4    Period Weeks    Status New    Target Date 12/05/20  PT LONG TERM GOAL #4   Title Patient will be able to stand > 15 minutes with no increased neck pain for improved ability to perform cooking/ cleaning/ and grooming ADLs.    Time 4    Period Weeks     Status New    Target Date 12/05/20                  Plan - 11/07/20 1624    Clinical Impression Statement Patient is a 87 y.o. female who presents to physical therapy with complaint of  neck pain. Patient demonstrates decreased strength, ROM restriction, reduced flexibility, increased tenderness to palpation and postural abnormalities which are likely contributing to symptoms of pain and are negatively impacting patient ability to perform ADLs. Patient will benefit from skilled physical therapy services to address these deficits to reduce pain and improve level of function with ADLs    Personal Factors and Comorbidities Time since onset of injury/illness/exacerbation;Age;Comorbidity 2    Comorbidities arthritis, osteoporosis    Examination-Activity Limitations Lift;Carry;Reach Overhead;Stand    Examination-Participation Restrictions Laundry;Cleaning;Community Activity;Yard Work    Stability/Clinical Decision Making Stable/Uncomplicated    Designer, jewellery Low    Rehab Potential Good    PT Frequency 2x / week    PT Duration 4 weeks    PT Treatment/Interventions ADLs/Self Care Home Management;Aquatic Therapy;Biofeedback;Canalith Repostioning;Cryotherapy;Parrafin;Ultrasound;Functional mobility training;Stair training;Neuromuscular re-education;Balance training;Gait training;DME Instruction;Therapeutic exercise;Contrast Bath;Fluidtherapy;Manual lymph drainage;Compression bandaging;Orthotic Fit/Training;Patient/family education;Scar mobilization;Passive range of motion;Manual techniques;Dry needling;Traction;Moist Heat;Iontophoresis 4mg /ml Dexamethasone;Electrical Stimulation;Energy conservation;Therapeutic activities;Visual/perceptual remediation/compensation;Splinting;Taping;Vasopneumatic Device;Spinal Manipulations;Vestibular;Joint Manipulations    PT Next Visit Plan Review goals and HEP. Progress postural strengthening, add thoracic decompression series. Gentle neck stretches. Manual  STM and traction as needed for pain and restriction.    PT Home Exercise Plan Eval: supine chin tuck, scapular retraction, supine thorcic mob on vertical towel roll    Consulted and Agree with Plan of Care Patient           Patient will benefit from skilled therapeutic intervention in order to improve the following deficits and impairments:  Pain,Improper body mechanics,Increased fascial restricitons,Postural dysfunction,Decreased mobility,Impaired UE functional use,Decreased strength,Decreased range of motion,Hypomobility,Impaired flexibility  Visit Diagnosis: Cervicalgia  Abnormal posture     Problem List Patient Active Problem List   Diagnosis Date Noted  . Orbital wall fracture ([place]) 05/16/2019  . Hyperlipidemia 05/16/2019  . Chronic pain disorder 05/12/2019  . Depression with anxiety 05/06/2019  . GERD without esophagitis 05/06/2019  . Chronic constipation 05/06/2019  . CKD (chronic kidney disease) stage 3, GFR 30-59 ml/min (HCC) 05/06/2019  . Subdural hematoma ([place]) 05/06/2019  . Inferior pubic ramus fracture, right, sequela 05/06/2019  . Closed right acetabular fracture ([place]) 05/06/2019  . Closed nondisplaced fracture of proximal phalanx of right middle finger 05/06/2019  . Pelvic hematoma, female 05/06/2019  . Fall 04/24/2019  . Pneumonia involving right lung 06/18/2018  . Hoarseness 07/22/2015  . Acute rhinitis 03/18/2015  . Sinusitis, acute maxillary 04/02/2014  . Encounter for therapeutic drug monitoring 10/05/2013  . Shingles 09/06/2013  . Postherpetic neuralgia 09/04/2013  . Arthritis, lumbar spine 04/20/2013  . Back pain 04/20/2013  . Mild anxiety 08/20/2012  . Allergic rhinitis due to pollen 11/30/2011  . Lung nodules 11/30/2011  . Renal insufficiency 01/16/2011  . Long term current use of anticoagulant 12/19/2010  . ANEMIA 10/09/2010  . Secondary cardiomyopathy ([place]) 06/21/2010  . Atrial fibrillation ([place]) 05/28/2010  . MITRAL REGURGITATION  05/25/2008  . BARRETT'S ESOPHAGUS, HX OF 05/25/2008  . Asthma with COPD (chronic obstructive  pulmonary disease) ([place]) 11/12/2007  . Obstructive sleep apnea 11/12/2007  . MITRAL VALVE PROLAPSE 09/03/2007   4:38 PM, 11/07/20 Josue Hector PT DPT  Physical Therapist with [facility]  [phone] [place], [place], [address] Phone: [phone]   Fax:  [phone]  Name: MARYFER TAUZIN MRN: [id number] Date of Birth: 25-Apr-1934

## 2020-11-07 NOTE — Patient Instructions (Signed)
Access Code: [email or web address] URL: [email or web address] Date: 11/07/2020 Prepared by: Josue Hector  Exercises Supine Cervical Retraction with Towel - 3 x daily - 7 x weekly - 2 sets - 10 reps - 5 seconds hold Supine Scapular Retraction - 3 x daily - 7 x weekly - 2 sets - 10 reps - seconds hold Supine Thoracic Mobilization Towel Roll Vertical - 3 x daily - 7 x weekly - 1 sets - 1 reps - 3-5 minutes hold

## 2020-11-08 ENCOUNTER — Ambulatory Visit (HOSPITAL_COMMUNITY): Payer: Medicare PPO | Admitting: Physical Therapy

## 2020-11-08 ENCOUNTER — Encounter (HOSPITAL_COMMUNITY): Payer: Self-pay | Admitting: Physical Therapy

## 2020-11-08 DIAGNOSIS — M542 Cervicalgia: Secondary | ICD-10-CM | POA: Diagnosis not present

## 2020-11-08 DIAGNOSIS — R293 Abnormal posture: Secondary | ICD-10-CM | POA: Diagnosis not present

## 2020-11-08 NOTE — Therapy (Signed)
[facility] [address] [place], [place], [address] Phone: [phone]   Fax:  [phone]  Physical Therapy Treatment  Patient Details  Name: [name] MRN: [id number] Date of Birth: 1933-12-26 Referring Provider (PT): Arther Abbott MD   Encounter Date: 11/08/2020   PT End of Session - 11/08/20 1533    Visit Number 2    Number of Visits 8    Date for PT Re-Evaluation 12/05/20    Authorization Type [employer]    Authorization Time Period 8 visits approved 3/2 - 3/30    Authorization - Visit Number 1    Authorization - Number of Visits 8    PT Start Time 5885    PT Stop Time 1614    PT Time Calculation (min) 40 min    Activity Tolerance Patient tolerated treatment well    Behavior During Therapy [facility] for tasks assessed/performed           Past Medical History:  Diagnosis Date  . Asthma   . Atrial fibrillation ([place])   . Chronic rhinitis   . COPD (chronic obstructive pulmonary disease) ([place])   . GERD (gastroesophageal reflux disease)   . Mitral regurgitation   . Mitral valve prolapse   . Nasal polyps   . Nonischemic cardiomyopathy (HCC)    LVEF 25-30% improved to 45-50%  . Obstructive sleep apnea   . Transudative pleural effusion     Past Surgical History:  Procedure Laterality Date  . APPENDECTOMY  2000  . Bilateral cataract surgery    . LAPAROSCOPIC NISSEN FUNDOPLICATION  0277  . NOSE SURGERY      There were no vitals filed for this visit.   Subjective Assessment - 11/08/20 1533    Subjective Patient states her neck was very stiff and sore. Patient states she was able to work on her exercises some. She feels like they were helpful.    Pertinent History arthritis, osteoporosis    Limitations Standing;House hold activities;Lifting    How long can you stand comfortably? 10-15 minutes    Diagnostic tests xrays    Patient Stated Goals feel better    Currently in Pain? No/denies    Pain Onset More than a  month ago                             [facility] Adult PT Treatment/Exercise - 11/08/20 0001      Neck Exercises: Standing   Neck Retraction 10 reps    Neck Retraction Limitations 2 sets, nodding    Other Standing Exercises cervical retraction isometric into towel at wall 10x 5 second holds      Neck Exercises: Seated   Other Seated Exercise thoracic extension over chair 2x 10 with 5 second holds      Neck Exercises: Supine   Neck Retraction 10 reps;3 secs    Neck Retraction Limitations 2 sets    Other Supine Exercise scapular retraction 2x10    Other Supine Exercise shoulder flexion with band between hands red band 1x 10, shoulder ER with scap retraction 1x 10 red band, PNF D2 red band bilateral 1x 10, horizontal abd with band 1x 10                  PT Education - 11/08/20 1533    Education Details HEP, exercise mechanics    Person(s) Educated Patient    Methods Explanation;Demonstration    Comprehension Verbalized  understanding;Returned demonstration            PT Short Term Goals - 11/07/20 1628      PT SHORT TERM GOAL #1   Title Patient will be independent with initial HEP and self-management strategies to improve functional outcomes    Time 2    Period Weeks    Status New    Target Date 11/21/20             PT Long Term Goals - 11/07/20 1628      PT LONG TERM GOAL #1   Title Patient will improve FOTO score by at least 5% to indicate improvement in functional outcomes    Time 4    Period Weeks    Status New    Target Date 12/05/20      PT LONG TERM GOAL #2   Title Patient improve cervical extension by at least 5 degrees in order to improve ability to scan environment for safety and performing overhead ADLs    Time 4    Period Weeks    Status New    Target Date 12/05/20      PT LONG TERM GOAL #3   Title Patient will report at least 70% overall improvement in subjective complaint to indicate improvement in ability to perform ADLs.     Time 4    Period Weeks    Status New    Target Date 12/05/20      PT LONG TERM GOAL #4   Title Patient will be able to stand > 15 minutes with no increased neck pain for improved ability to perform cooking/ cleaning/ and grooming ADLs.    Time 4    Period Weeks    Status New    Target Date 12/05/20                 Plan - 11/08/20 1533    Clinical Impression Statement Patient requires verbal and tactile cueing for mechanics of previously completed exercises. Patient tolerates addition of supine decompression exercises with band well without c/o symptoms. Patient requires min cueing throughout session for mechanics and positioning with good/fair carry over. Patient with difficulty with cervical retraction isometric secondary to impaired motor control and ROM and tends to want to extend c/sp despite manual, tactile, and verbal cueing. Patient will continue to benefit from skilled physical therapy in order to reduce impairment and improve function.    Personal Factors and Comorbidities Time since onset of injury/illness/exacerbation;Age;Comorbidity 2    Comorbidities arthritis, osteoporosis    Examination-Activity Limitations Lift;Carry;Reach Overhead;Stand    Examination-Participation Restrictions Laundry;Cleaning;Community Activity;Yard Work    Stability/Clinical Decision Making Stable/Uncomplicated    Rehab Potential Good    PT Frequency 2x / week    PT Duration 4 weeks    PT Treatment/Interventions ADLs/Self Care Home Management;Aquatic Therapy;Biofeedback;Canalith Repostioning;Cryotherapy;Parrafin;Ultrasound;Functional mobility training;Stair training;Neuromuscular re-education;Balance training;Gait training;DME Instruction;Therapeutic exercise;Contrast Bath;Fluidtherapy;Manual lymph drainage;Compression bandaging;Orthotic Fit/Training;Patient/family education;Scar mobilization;Passive range of motion;Manual techniques;Dry needling;Traction;Moist Heat;Iontophoresis 4mg /ml  Dexamethasone;Electrical Stimulation;Energy conservation;Therapeutic activities;Visual/perceptual remediation/compensation;Splinting;Taping;Vasopneumatic Device;Spinal Manipulations;Vestibular;Joint Manipulations    PT Next Visit Plan Progress postural strengthening. Gentle neck stretches. Manual STM and traction as needed for pain and restriction.    PT Home Exercise Plan Eval: supine chin tuck, scapular retraction, supine thorcic mob on vertical towel roll 3/3 decompression with band exercises in supine    Consulted and Agree with Plan of Care Patient           Patient will benefit from skilled therapeutic intervention in order to improve  the following deficits and impairments:  Pain,Improper body mechanics,Increased fascial restricitons,Postural dysfunction,Decreased mobility,Impaired UE functional use,Decreased strength,Decreased range of motion,Hypomobility,Impaired flexibility  Visit Diagnosis: Cervicalgia  Abnormal posture     Problem List Patient Active Problem List   Diagnosis Date Noted  . Orbital wall fracture ([place]) 05/16/2019  . Hyperlipidemia 05/16/2019  . Chronic pain disorder 05/12/2019  . Depression with anxiety 05/06/2019  . GERD without esophagitis 05/06/2019  . Chronic constipation 05/06/2019  . CKD (chronic kidney disease) stage 3, GFR 30-59 ml/min (HCC) 05/06/2019  . Subdural hematoma ([place]) 05/06/2019  . Inferior pubic ramus fracture, right, sequela 05/06/2019  . Closed right acetabular fracture ([place]) 05/06/2019  . Closed nondisplaced fracture of proximal phalanx of right middle finger 05/06/2019  . Pelvic hematoma, female 05/06/2019  . Fall 04/24/2019  . Pneumonia involving right lung 06/18/2018  . Hoarseness 07/22/2015  . Acute rhinitis 03/18/2015  . Sinusitis, acute maxillary 04/02/2014  . Encounter for therapeutic drug monitoring 10/05/2013  . Shingles 09/06/2013  . Postherpetic neuralgia 09/04/2013  . Arthritis, lumbar spine 04/20/2013  . Back pain  04/20/2013  . Mild anxiety 08/20/2012  . Allergic rhinitis due to pollen 11/30/2011  . Lung nodules 11/30/2011  . Renal insufficiency 01/16/2011  . Long term current use of anticoagulant 12/19/2010  . ANEMIA 10/09/2010  . Secondary cardiomyopathy ([place]) 06/21/2010  . Atrial fibrillation ([place]) 05/28/2010  . MITRAL REGURGITATION 05/25/2008  . BARRETT'S ESOPHAGUS, HX OF 05/25/2008  . Asthma with COPD (chronic obstructive pulmonary disease) ([place]) 11/12/2007  . Obstructive sleep apnea 11/12/2007  . MITRAL VALVE PROLAPSE 09/03/2007   4:15 PM, 11/08/20 Mearl Latin PT, DPT Physical Therapist at [facility] [place], [place], [address] Phone: [phone]   Fax:  [phone]  Name: [name] MRN: [id number] Date of Birth: [birth year]

## 2020-11-13 ENCOUNTER — Ambulatory Visit (HOSPITAL_COMMUNITY): Payer: Medicare PPO | Admitting: Physical Therapy

## 2020-11-13 ENCOUNTER — Encounter (HOSPITAL_COMMUNITY): Payer: Self-pay | Admitting: Physical Therapy

## 2020-11-13 ENCOUNTER — Other Ambulatory Visit: Payer: Self-pay

## 2020-11-13 DIAGNOSIS — R293 Abnormal posture: Secondary | ICD-10-CM

## 2020-11-13 DIAGNOSIS — M542 Cervicalgia: Secondary | ICD-10-CM

## 2020-11-13 NOTE — Therapy (Signed)
[facility] [place], [place], [address] Phone: [phone]   Fax:  [phone]  Patient Details  Name: [name] MRN: [id number] Date of Birth: [birth year] Referring Provider:  Redmond School, MD  Encounter Date: 11/13/2020  Patient arrives with bruised eye today as she hit her head on the counter over the weekend. Patient states she has had dizziness since hitting her head and she is on blood thinners. She has not had any follow up at this time regarding this injury. Patient educated on going to ED for follow up due to head injury, dizziness and taking blood thinners. Patient states she has MD follow up tomorrw but educated patient on following up sooner due to nature of the injury. She verbalized understanding.  4:03 PM, 11/13/20 Mearl Latin PT, DPT Physical Therapist at [facility] [place], [place], [address] Phone: [phone]   Fax:  [phone]

## 2020-11-14 ENCOUNTER — Ambulatory Visit (INDEPENDENT_AMBULATORY_CARE_PROVIDER_SITE_OTHER): Payer: Medicare PPO | Admitting: *Deleted

## 2020-11-14 DIAGNOSIS — Z5181 Encounter for therapeutic drug level monitoring: Secondary | ICD-10-CM

## 2020-11-14 DIAGNOSIS — I482 Chronic atrial fibrillation, unspecified: Secondary | ICD-10-CM

## 2020-11-14 LAB — POCT INR: INR: 3.1 — AB (ref 2.0–3.0)

## 2020-11-14 NOTE — Patient Instructions (Signed)
Take warfarin 1/2 tablet tonight then decrease dose to 1 tablet daily Recheck in 3 wks

## 2020-11-15 ENCOUNTER — Telehealth (HOSPITAL_COMMUNITY): Payer: Self-pay | Admitting: Physical Therapy

## 2020-11-15 ENCOUNTER — Ambulatory Visit (HOSPITAL_COMMUNITY): Payer: Medicare PPO | Admitting: Physical Therapy

## 2020-11-15 ENCOUNTER — Other Ambulatory Visit: Payer: Self-pay | Admitting: Cardiology

## 2020-11-15 NOTE — Telephone Encounter (Signed)
pt called to cx this appt due to she has a conflicting appt in [employer]

## 2020-11-20 ENCOUNTER — Ambulatory Visit (HOSPITAL_COMMUNITY): Payer: Medicare PPO | Admitting: Physical Therapy

## 2020-11-21 IMAGING — CR JUDET PELVIS - 3+ VIEW
3 series · 3 of 3 positions shown · non-contrast
Comparison: Pelvis and hip radiograph earlier this day. Abdominal
CT yesterday.

CLINICAL DATA: Right acetabular fractures.

EXAM:
JUDET PELVIS - 3+ VIEW

[pelvis obl (1 of 2)]
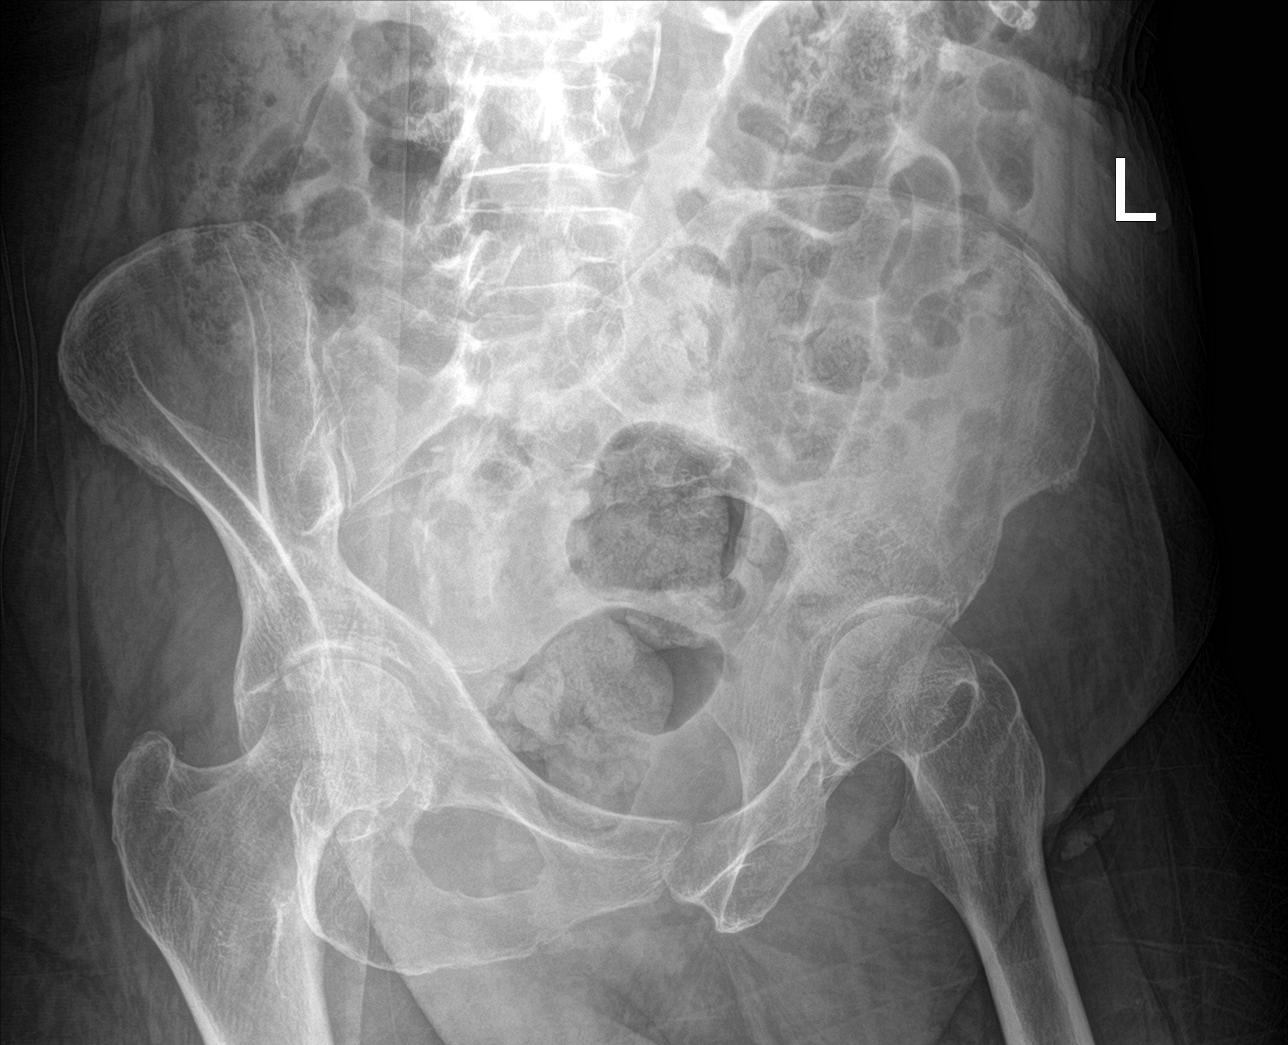

[pelvis obl (2 of 2)]
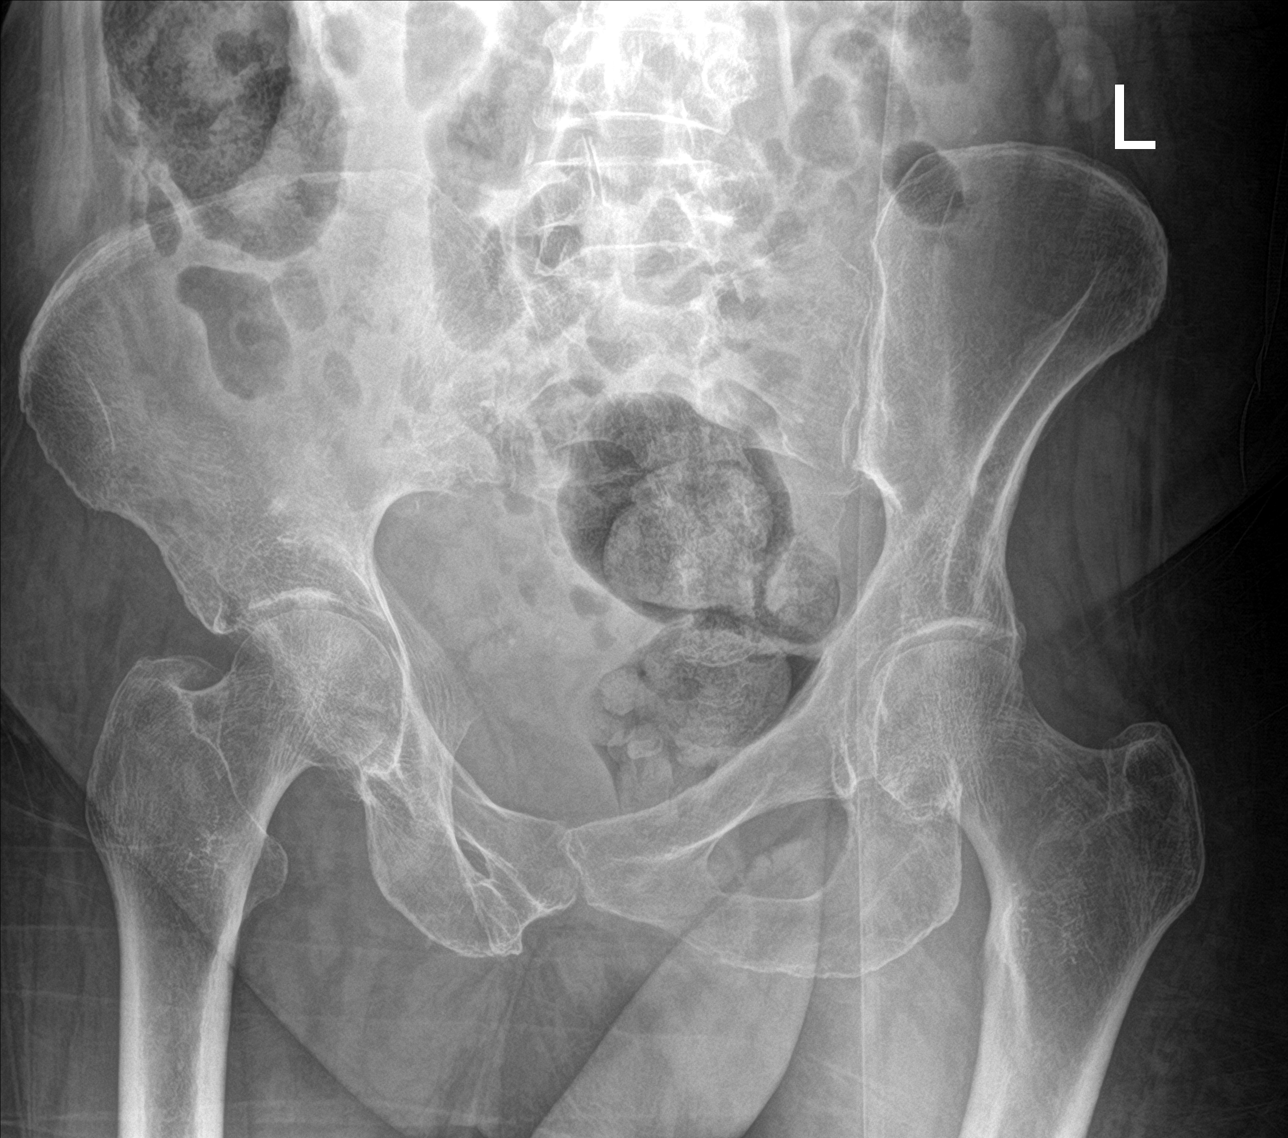

[pelvis ap]
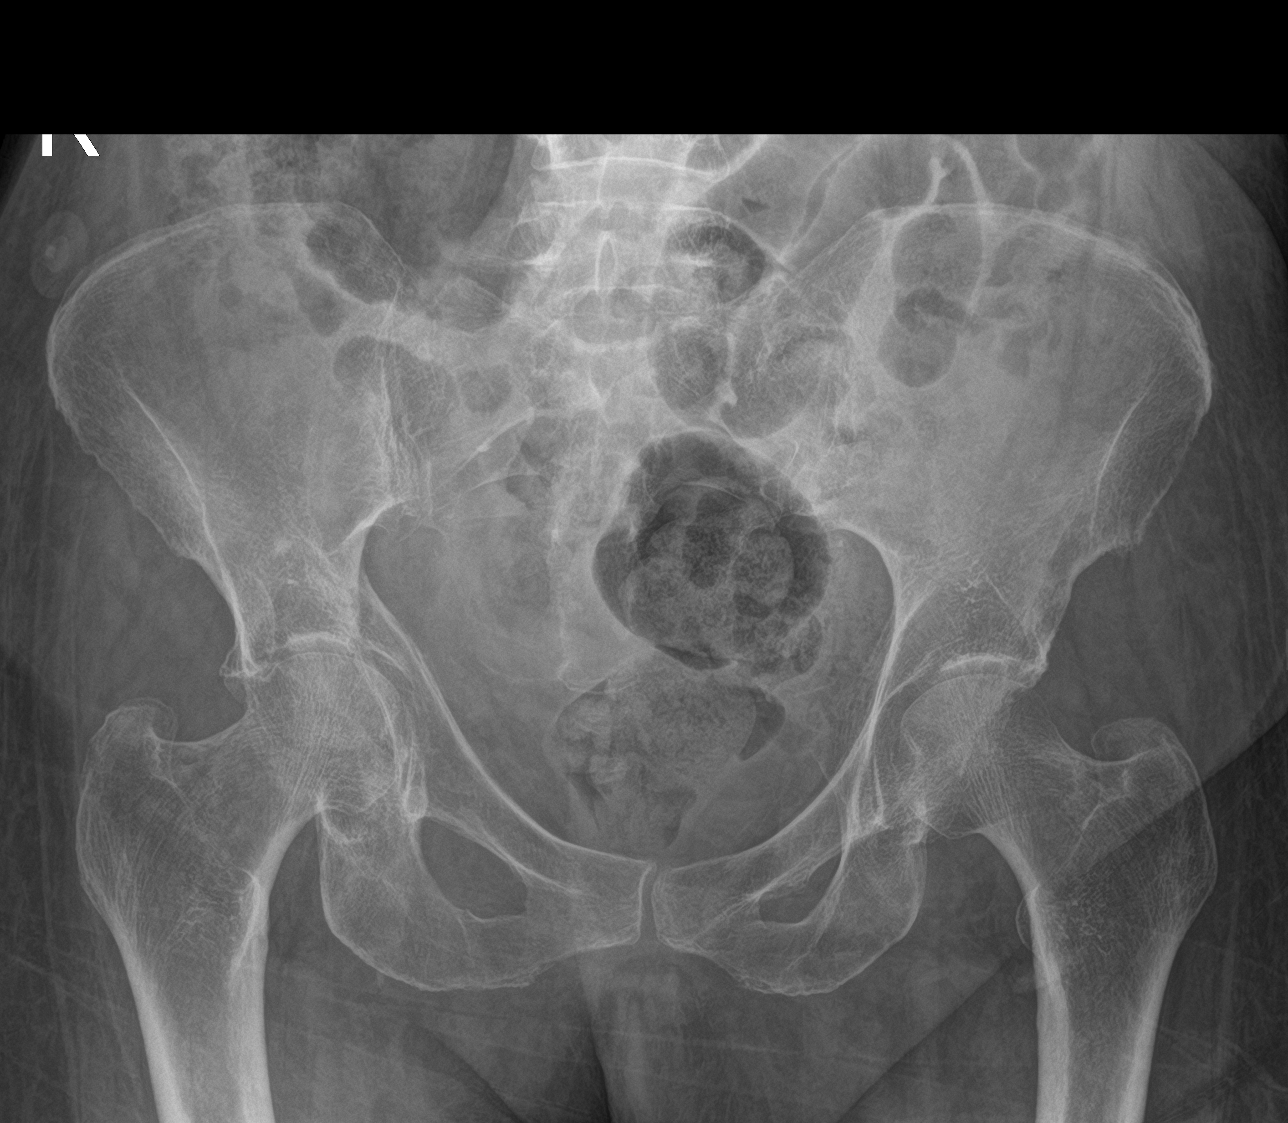

[3 of 3 positions shown; findings below may reference images not displayed]

FINDINGS: Comminuted right acetabular fracture which is minimally displaced
superiorly and posteriorly. Fracture extension into the right pubic
ramus was better appreciated on CT. Overall alignment is not
significantly changed from prior exam. No new abnormalities.
IMPRESSION: Unchanged alignment of comminuted right acetabular fracture.

## 2020-11-21 IMAGING — DX DG HIP (WITH OR WITHOUT PELVIS) 2-3V RIGHT
3 series · 3 of 3 positions shown · non-contrast
Comparison: None.

CLINICAL DATA: Right acetabular fracture.

EXAM:
DG HIP (WITH OR WITHOUT PELVIS) 2-3V RIGHT

[pelvis ap]
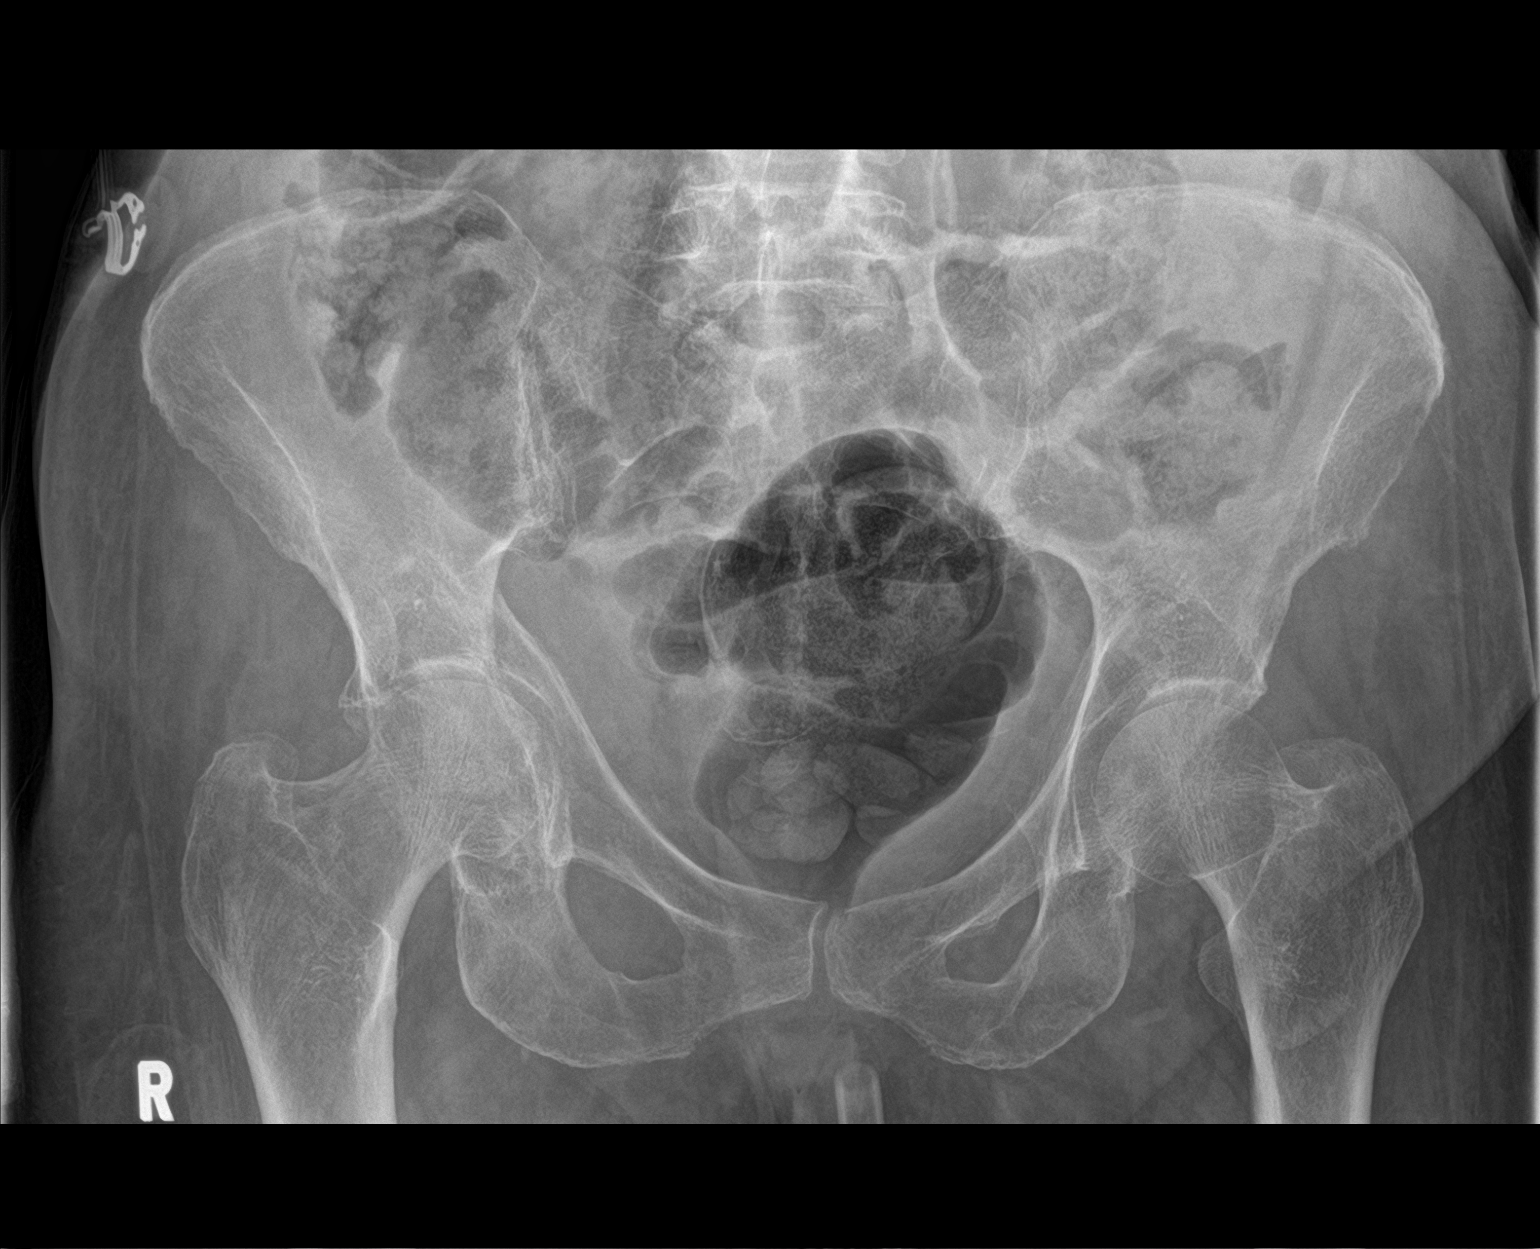

[hip ap]
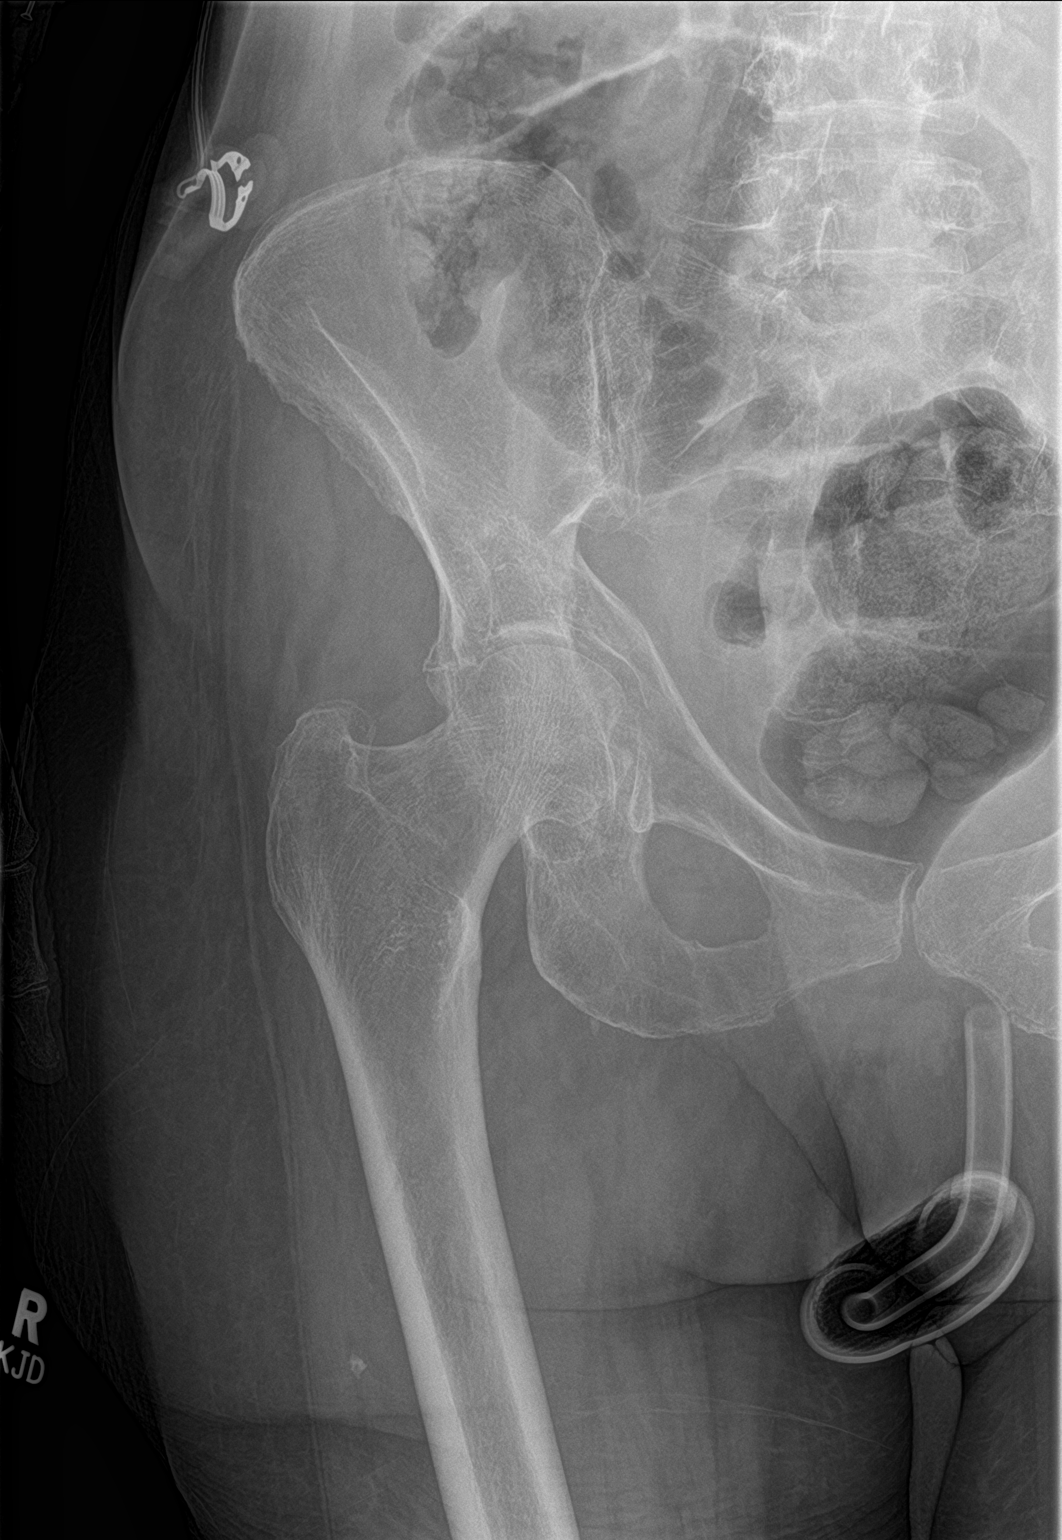

[hip lat]
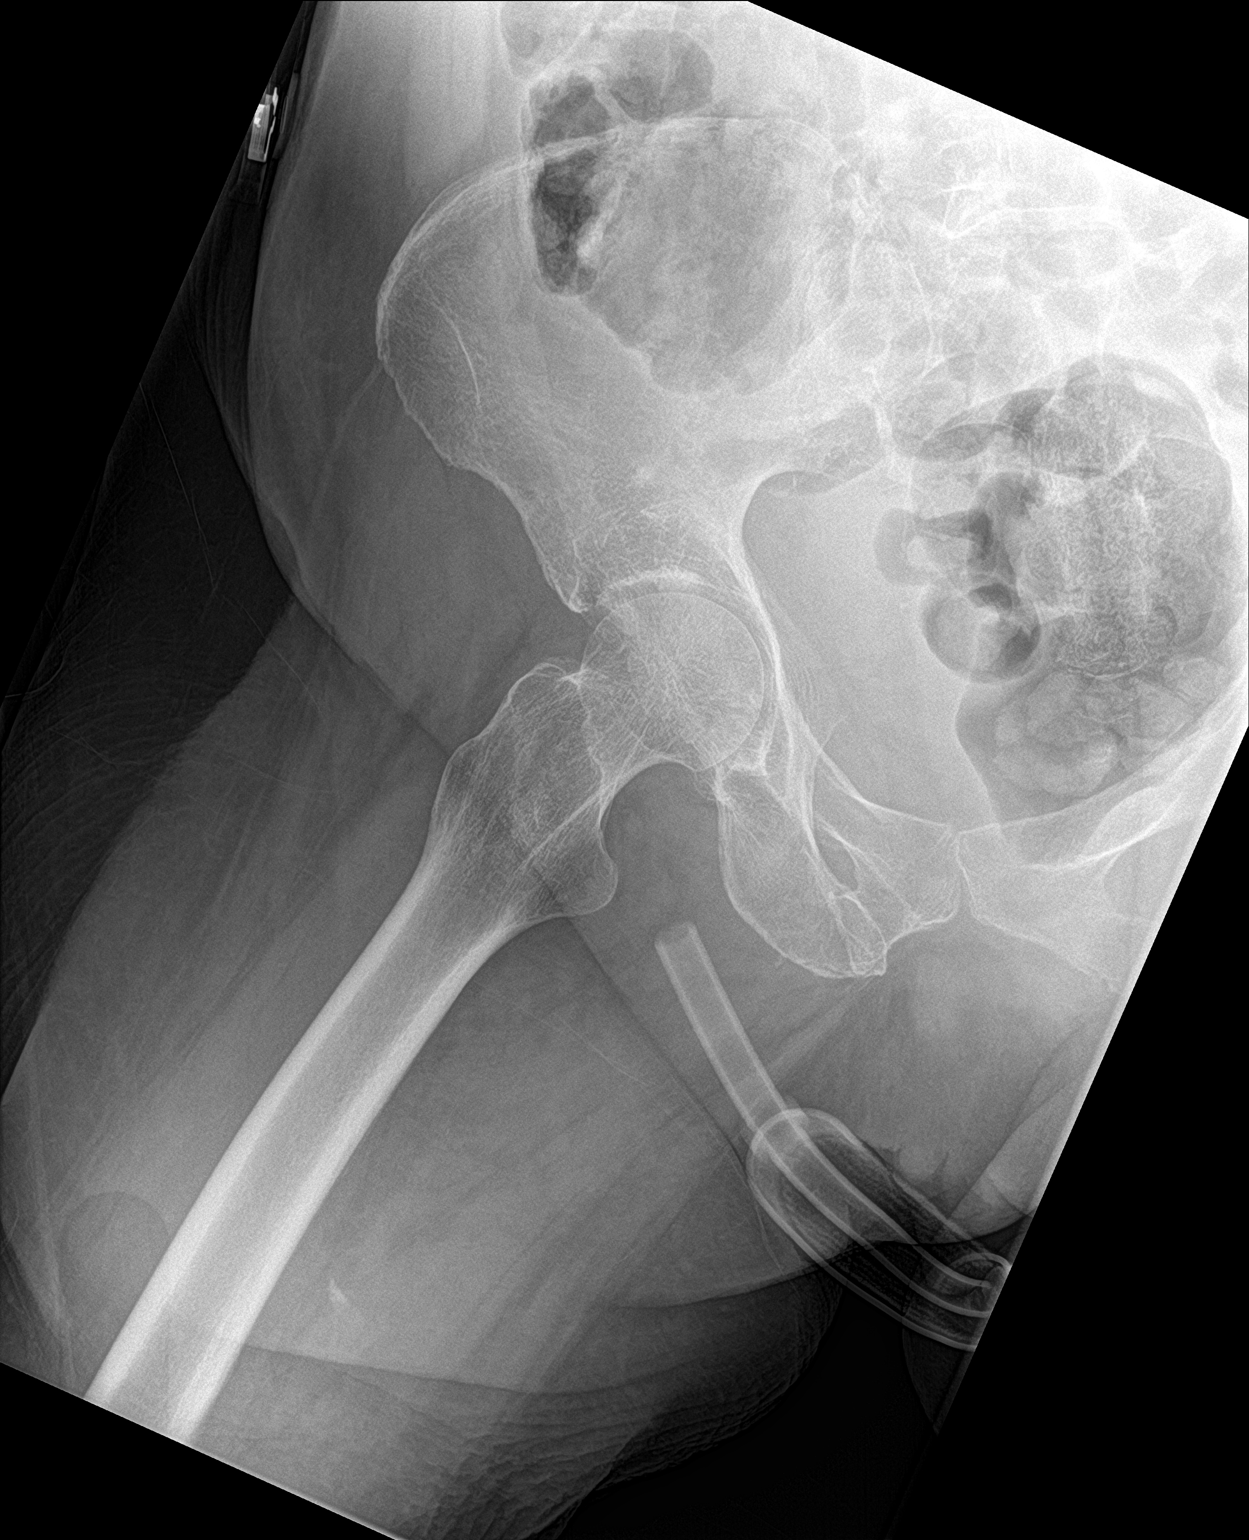

[3 of 3 positions shown; findings below may reference images not displayed]

FINDINGS: Minimally displaced fracture of the right acetabulum is seen.
Fractures of the right superior and inferior pubic rami are also
seen. No evidence of proximal femur fracture or hip dislocation.
IMPRESSION: Right acetabular and pubic rami fractures.

## 2020-11-22 ENCOUNTER — Ambulatory Visit (HOSPITAL_COMMUNITY): Payer: Medicare PPO | Admitting: Physical Therapy

## 2020-11-27 ENCOUNTER — Ambulatory Visit (HOSPITAL_COMMUNITY): Payer: Medicare PPO | Admitting: Physical Therapy

## 2020-11-29 ENCOUNTER — Telehealth (HOSPITAL_COMMUNITY): Payer: Self-pay | Admitting: Physical Therapy

## 2020-11-29 ENCOUNTER — Encounter (HOSPITAL_COMMUNITY): Payer: Medicare PPO | Admitting: Physical Therapy

## 2020-11-29 NOTE — Telephone Encounter (Signed)
Called patient about missed appointments on 3/22 and today. Reminded patient of last scheduled appointment upcoming this Tuesday and to call if needing to reschedule or cancel.   6:36 PM, 11/29/20 Josue Hector PT DPT  Physical Therapist with [facility]  [phone]

## 2020-12-04 ENCOUNTER — Ambulatory Visit (HOSPITAL_COMMUNITY): Payer: Medicare PPO | Admitting: Physical Therapy

## 2020-12-04 ENCOUNTER — Telehealth (HOSPITAL_COMMUNITY): Payer: Self-pay | Admitting: Physical Therapy

## 2020-12-04 NOTE — Telephone Encounter (Signed)
Called and left message about 3rd no show appointment today. Current episode of care with be DC per attendance policy. Left office number for patient with any further questions or concerns.   4:26 PM, 12/04/20 Josue Hector PT DPT  Physical Therapist with [facility]  [phone]

## 2020-12-05 ENCOUNTER — Ambulatory Visit (INDEPENDENT_AMBULATORY_CARE_PROVIDER_SITE_OTHER): Payer: Medicare PPO | Admitting: *Deleted

## 2020-12-05 DIAGNOSIS — Z5181 Encounter for therapeutic drug level monitoring: Secondary | ICD-10-CM

## 2020-12-05 DIAGNOSIS — I482 Chronic atrial fibrillation, unspecified: Secondary | ICD-10-CM

## 2020-12-05 LAB — POCT INR: INR: 3.8 — AB (ref 2.0–3.0)

## 2020-12-05 NOTE — Patient Instructions (Signed)
Hold warfarin tonight then decrease dose to 1 tablet daily except 1/2 tablet on Tuesdays and Fridays Recheck in 3 wks

## 2020-12-06 ENCOUNTER — Encounter (HOSPITAL_COMMUNITY): Payer: Medicare PPO | Admitting: Physical Therapy

## 2020-12-26 ENCOUNTER — Ambulatory Visit (INDEPENDENT_AMBULATORY_CARE_PROVIDER_SITE_OTHER): Payer: Medicare PPO | Admitting: *Deleted

## 2020-12-26 ENCOUNTER — Other Ambulatory Visit: Payer: Self-pay

## 2020-12-26 DIAGNOSIS — I482 Chronic atrial fibrillation, unspecified: Secondary | ICD-10-CM | POA: Diagnosis not present

## 2020-12-26 DIAGNOSIS — Z5181 Encounter for therapeutic drug level monitoring: Secondary | ICD-10-CM | POA: Diagnosis not present

## 2020-12-26 LAB — POCT INR: INR: 3.1 — AB (ref 2.0–3.0)

## 2020-12-26 NOTE — Patient Instructions (Signed)
Decrease warfarin to 1 tablet daily except 1/2 tablet on Mondays, Wednesdays and Fridays Recheck in 3 wks

## 2021-01-16 ENCOUNTER — Ambulatory Visit (INDEPENDENT_AMBULATORY_CARE_PROVIDER_SITE_OTHER): Payer: Medicare PPO | Admitting: *Deleted

## 2021-01-16 DIAGNOSIS — Z5181 Encounter for therapeutic drug level monitoring: Secondary | ICD-10-CM

## 2021-01-16 DIAGNOSIS — I482 Chronic atrial fibrillation, unspecified: Secondary | ICD-10-CM

## 2021-01-16 LAB — POCT INR: INR: 2.4 (ref 2.0–3.0)

## 2021-01-16 NOTE — Patient Instructions (Signed)
Continue warfarin 1 tablet daily except 1/2 tablet on Mondays, Wednesdays and Fridays Recheck in 4 wks 

## 2021-02-04 ENCOUNTER — Other Ambulatory Visit: Payer: Self-pay | Admitting: Cardiology

## 2021-02-13 ENCOUNTER — Ambulatory Visit (INDEPENDENT_AMBULATORY_CARE_PROVIDER_SITE_OTHER): Payer: Medicare PPO | Admitting: *Deleted

## 2021-02-13 DIAGNOSIS — I482 Chronic atrial fibrillation, unspecified: Secondary | ICD-10-CM | POA: Diagnosis not present

## 2021-02-13 DIAGNOSIS — Z5181 Encounter for therapeutic drug level monitoring: Secondary | ICD-10-CM

## 2021-02-13 LAB — POCT INR: INR: 1.7 — AB (ref 2.0–3.0)

## 2021-02-13 NOTE — Patient Instructions (Signed)
Take warfarin 1 1/2 tablets tonight then resume 1 tablet daily except 1/2 tablet on Mondays, Wednesdays and Fridays Recheck in 3 wks

## 2021-02-21 ENCOUNTER — Telehealth: Payer: Self-pay | Admitting: Cardiology

## 2021-02-21 NOTE — Telephone Encounter (Signed)
Error

## 2021-03-05 ENCOUNTER — Other Ambulatory Visit: Payer: Self-pay

## 2021-03-05 ENCOUNTER — Ambulatory Visit: Payer: Medicare PPO | Admitting: Cardiology

## 2021-03-05 ENCOUNTER — Encounter: Payer: Self-pay | Admitting: Cardiology

## 2021-03-05 VITALS — BP 112/56 | HR 65 | Ht 66.0 in | Wt 151.6 lb

## 2021-03-05 DIAGNOSIS — I38 Endocarditis, valve unspecified: Secondary | ICD-10-CM | POA: Diagnosis not present

## 2021-03-05 DIAGNOSIS — I4891 Unspecified atrial fibrillation: Secondary | ICD-10-CM

## 2021-03-05 DIAGNOSIS — I4821 Permanent atrial fibrillation: Secondary | ICD-10-CM | POA: Diagnosis not present

## 2021-03-05 MED ORDER — DILTIAZEM HCL ER COATED BEADS 180 MG PO CP24: 180.0000 mg | ORAL_CAPSULE | Freq: Every day | ORAL | 3 refills | Status: DC

## 2021-03-05 NOTE — Progress Notes (Signed)
Cardiology Office Note  Date: 03/05/2021   ID: [name], DOB 12/29/1933, MRN [id number]  PCP:  Redmond School, MD  Cardiologist:  [name], MD Electrophysiologist:  None   Chief Complaint  Patient presents with   Cardiac follow-up    History of Present Illness: [name] is an 87 y.o. female last seen in December 2021.  She presents for a follow-up visit.  She was tearful today, her 24 year old niece passed away last week fairly suddenly.  She is going through the grieving process.  No definite change in cardiopulmonary symptoms although she has had some trouble with allergies recently.  Also reports heart rates running somewhat slower, no dizziness or syncope.  I personally reviewed her ECG today which shows atrial fibrillation at 66 bpm, left posterior fascicular block and low voltage.  She remains on Coumadin with follow-up in the anticoagulation clinic.  Past Medical History:  Diagnosis Date   Asthma    Atrial fibrillation (HCC)    Chronic rhinitis    COPD (chronic obstructive pulmonary disease) (HCC)    GERD (gastroesophageal reflux disease)    Mitral regurgitation    Mitral valve prolapse    Nasal polyps    Nonischemic cardiomyopathy (HCC)    LVEF 25-30% improved to 45-50%   Obstructive sleep apnea    Transudative pleural effusion     Past Surgical History:  Procedure Laterality Date   APPENDECTOMY  2000   Bilateral cataract surgery     LAPAROSCOPIC NISSEN FUNDOPLICATION  3903   NOSE SURGERY      Current Outpatient Medications  Medication Sig Dispense Refill   acetaminophen (TYLENOL) 325 MG tablet Take 2 tablets (650 mg total) by mouth every 6 (six) hours as needed for mild pain, fever or headache.     albuterol (PROVENTIL) (2.5 MG/3ML) 0.083% nebulizer solution Take 3 mLs (2.5 mg total) by nebulization every 6 (six) hours as needed for wheezing or shortness of breath. 75 mL 12   albuterol (VENTOLIN HFA) 108 (90 Base) MCG/ACT inhaler  Inhale 2 puffs into the lungs every 6 (six) hours as needed for wheezing or shortness of breath. 6.7 g 5   Budeson-Glycopyrrol-Formoterol (BREZTRI AEROSPHERE) 160-9-4.8 MCG/ACT AERO Inhale 2 puffs into the lungs in the morning and at bedtime. 10.7 g 5   digoxin (LANOXIN) 0.125 MG tablet TAKE 1 TABLET EVERY DAY 90 tablet 1   diltiazem (CARDIZEM CD) 180 MG 24 hr capsule Take 1 capsule (180 mg total) by mouth daily. 90 capsule 3   docusate sodium (COLACE) 100 MG capsule Take 100 mg by mouth 2 (two) times daily.     escitalopram (LEXAPRO) 10 MG tablet Take 1 tablet (10 mg total) by mouth every morning. 30 tablet 0   esomeprazole (NEXIUM) 40 MG capsule Take 1 capsule (40 mg total) by mouth 2 (two) times daily. 60 capsule 0   furosemide (LASIX) 40 MG tablet TAKE 1 TABLET BY MOUTH EVERY DAY (Patient taking differently: 80 mg.) 90 tablet 3   NON FORMULARY Diet- Regular     phenylephrine-shark liver oil-mineral oil-petrolatum (PREPARATION H) 0.25-3-14-71.9 % rectal ointment Place 1 application rectally 2 (two) times daily as needed for hemorrhoids.     polyethylene glycol (MIRALAX / GLYCOLAX) 17 g packet Take 17 g by mouth daily. 14 each 0   potassium chloride SA (K-DUR) 20 MEQ tablet Take 1 tablet (20 mEq total) by mouth daily. 30 tablet 0   warfarin (COUMADIN) 5 MG tablet TAKE 1 TABLET BY  MOUTH EVERY DAY OR AS DIRECTED 90 tablet 3   No current facility-administered medications for this visit.   Allergies:  Amoxicillin-pot clavulanate, Clarithromycin, Clindamycin, Doxycycline, and Penicillins   ROS: No sudden dizziness or syncope.  Physical Exam: VS:  BP (!) 112/56   Pulse 65   Ht 5\' 6"  (1.676 m)   Wt 151 lb 9.6 oz (68.8 kg)   SpO2 96%   BMI 24.47 kg/m , BMI Body mass index is 24.47 kg/m.  Wt Readings from Last 3 Encounters:  03/05/21 151 lb 9.6 oz (68.8 kg)  10/29/20 154 lb (69.9 kg)  09/13/20 162 lb 12.8 oz (73.8 kg)    General: Elderly woman, appears comfortable at rest. HEENT:  Conjunctiva and lids normal, wearing a mask. Neck: Supple, no elevated JVP or carotid bruits, no thyromegaly. Lungs: Decreased breath sounds, nonlabored breathing at rest. Cardiac: Irregularly irregular, 1/6 diastolic murmur, no gallop. Extremities: Mild lower leg edema.  ECG:  An ECG dated 02/27/2020 was personally reviewed today and demonstrated:  Atrial fibrillation with rightward axis, PVC and low voltage.  Recent Labwork: 05/11/2020: BUN 18; Creatinine, Ser 1.36; Hemoglobin 12.3; Platelets 126.0; Potassium 3.5; Pro B Natriuretic peptide (BNP) 437.0; Sodium 141   Other Studies Reviewed Today:  Echocardiogram 08/22/2020:  1. Left ventricular ejection fraction, by estimation, is 55 to 60%. The  left ventricle has normal function. The left ventricle has no regional  wall motion abnormalities. There is severe left ventricular hypertrophy.  Left ventricular diastolic parameters   are indeterminate.   2. Right ventricular systolic function is normal. The right ventricular  size is normal.   3. Left atrial size was severely dilated.   4. Right atrial size was moderately dilated.   5. The mitral valve is abnormal. Mild mitral valve regurgitation.  Moderate to severe mitral stenosis. Severe mitral annular calcification.  The mean mitral valve gradient is 10.1 mmHg with average heart rate of 54  bpm.   6. The aortic valve is tricuspid. There is mild calcification of the  aortic valve. There is mild thickening of the aortic valve. Aortic valve  regurgitation is mild. No aortic stenosis is present.   7. The inferior vena cava is normal in size with greater than 50%  respiratory variability, suggesting right atrial pressure of 3 mmHg.   Assessment and Plan:  1.  Permanent atrial fibrillation with CHA2DS2-VASc score of 4.  She is symptomatically stable, heart rate trend has been down however.  We will reduce Cardizem CD to 180 mg a daily.  Otherwise continue Coumadin for stroke  prophylaxis.  2.  Moderate to severe mitral stenosis and mild aortic regurgitation.  Fluid status has been relatively stable.  She continues on Lasix.  Medication Adjustments/Labs and Tests Ordered: Current medicines are reviewed at length with the patient today.  Concerns regarding medicines are outlined above.   Tests Ordered: Orders Placed This Encounter  Procedures   EKG 12-Lead    Medication Changes: Meds ordered this encounter  Medications   diltiazem (CARDIZEM CD) 180 MG 24 hr capsule    Sig: Take 1 capsule (180 mg total) by mouth daily.    Dispense:  90 capsule    Refill:  3    03/05/21 dose decreased to 180 mg qd     Disposition:  Follow up  6 months.  Signed, Satira Sark, MD, [facility] 03/05/2021 11:17 AM    [place] [facility] [address], [place], [place] [address] Phone: [phone])  HO:[phone]; Fax: [phone]

## 2021-03-05 NOTE — Patient Instructions (Signed)
Medication Instructions:   DECREASE Cardizem CD to 180 mg daily   *If you need a refill on your cardiac medications before your next appointment, please call your pharmacy*   Lab Work: None today  If you have labs (blood work) drawn today and your tests are completely normal, you will receive your results only by: [place] (if you have MyChart) OR A paper copy in the mail If you have any lab test that is abnormal or we need to change your treatment, we will call you to review the results.   Testing/Procedures: None today    Follow-Up: At [facility], you and your health needs are our priority.  As part of our continuing mission to provide you with exceptional heart care, we have created designated Provider Care Teams.  These Care Teams include your primary Cardiologist (physician) and Advanced Practice Providers (APPs -  Physician Assistants and Nurse Practitioners) who all work together to provide you with the care you need, when you need it.  We recommend signing up for the patient portal called "MyChart".  Sign up information is provided on this After Visit Summary.  MyChart is used to connect with patients for Virtual Visits (Telemedicine).  Patients are able to view lab/test results, encounter notes, upcoming appointments, etc.  Non-urgent messages can be sent to your provider as well.   To learn more about what you can do with MyChart, go to [email or web address].    Your next appointment:   6 month(s)  The format for your next appointment:   In Person  Provider:   [name], MD   Other Instructions None

## 2021-03-07 ENCOUNTER — Telehealth: Payer: Self-pay | Admitting: Internal Medicine

## 2021-03-07 MED ORDER — IPRATROPIUM BROMIDE 0.03 % NA SOLN: 2.0000 | Freq: Two times a day (BID) | NASAL | 4 refills | Status: AC

## 2021-03-07 MED ORDER — PREDNISONE 10 MG PO TABS: ORAL_TABLET | ORAL | 0 refills | Status: AC

## 2021-03-07 NOTE — Telephone Encounter (Signed)
Called and spoke with patient. She verbalized understanding. Will go ahead and send in the prednisone and ipratropium nasal spray.   RXs have been sent.   Nothing further needed at time of call.

## 2021-03-07 NOTE — Telephone Encounter (Signed)
Called and spoke with patient. She stated that she has been dealing with her post nasal drip for the past week. She feels the drainage is moving down to her lungs because she now has a productive cough with clear mucus. She has noticed a slight increase in SOB especially with exertion. She denied any fevers, body aches or being around anyone who has been sick recently. She is UTD on her vaccines. She is still using her albuterol nebulizer solution every 6 hours as needed.   She stated that when this happens, Dr. Annamaria Boots normally sends in prednisone for her. I advised her that Dr. Annamaria Boots is out of the office this week. She does have an appt with him next Thursday but she is scared to wait that long.   [facility] [place].   Dr. Erin Fulling, can you please advise since CY is out of the office? Thanks!

## 2021-03-13 NOTE — Progress Notes (Signed)
Patient ID: [name], female    DOB: [birth year], 86 y.o.   MRN: [id number]  HPI F former smoker, followed for Chronic asthma/ COPD/ Xolair, chronic rhinosinusitis, complicated by hx AFib/ anticoagulation, CHF, cardiomyopathy, mitral insufficiency, GERD, CAD Echocardiogram-atrial fibrillation, LVEF 30-35%, diffuse hypokinesis  ---------------------------------------------------------------------------------------------------------   09/13/20- 87 year old female former smoker followed for chronic asthma/COPD, chronic rhinosinusitis, lung nodules complicated by history A. Fib/anticoagulation/ coumadin, CHF, cardiomyopathy, mitral stenosis, GERD Barrett's, CAD Meds adjusted in December by Dr Domenic Polite for leg swelling. Neb albuterol, Ventolin hfa, Breztri,  Covid vax- 3 Moderna Flu vax had Blames arthritis for weakness in her neck, harder to hold her head up but nothing acute.  Cardiology increased her lasix but leg edema not yet improved.  Echo-Mitral regurg, EF 50-55%. CXR 05/11/20- IMPRESSION: Small bilateral pleural effusions.  No frank interstitial edema. CT Sinuses 05/25/20- IMPRESSION: 1. Progressed chronic right maxillary sinus disease since last year. Previous bilateral maxillary antrostomies. 2. But other paranasal sinuses are well pneumatized.  03/14/21- 87 year old female former smoker followed for chronic asthma/COPD, Chronic Rhinosinusitis, lung Nodules complicated by history A. Fib/anticoagulation/ coumadin, CHF, cardiomyopathy, mitral stenosis, GERD Barrett's, CAD -----Neb albuterol, Ventolin hfa, Breztri, ipratropium nasal spray,  Covid vax- 3 Moderna Body weight today-153 lbs -----Doing ok, issues with allergies Called 6/30> pred taper and ipratropium nasal spray ACT score 14 Thick clear nasal mucus. Prednisone taper in June. Occasional rescue inhaler 2-3x/ week. Tearful- Niece/companion died renal failure. Noting some blurred vision. Saw Ophthalmologist > diplopia    Review of Systems-see HPI   + = positive Constitutional:    weight loss, night sweats, fevers, chills, +fatigue, lassitude. HEENT:   No-  headaches, difficulty swallowing, tooth/dental problems, sore throat,  hoarseness      No-  sneezing, itching, ear ache, no- nasal congestion, +post nasal drip,  CV:  No- chest pain, no-orthopnea, PND, +swelling in lower extremities, anasarca, dizziness, palpitations Resp: +   shortness of breath with exertion or at rest.              No-   productive cough,  non-productive cough,  No- coughing up of blood.              No-   change in color of mucus.  wheezing.   Skin: No-   rash or lesions. GI:  No-   heartburn, indigestion, abdominal pain, nausea, vomiting,  GU: . MS:  No-   joint pain or swelling.  Neuro-     nothing unusual Psych:  No- change in mood or affect. No depression or anxiety.  No memory loss.  Objective:   Physical Exam General- Alert, Oriented, Affect-appropriate/ cheerful, Distress- none acute, thin Skin- no rash visible now on right lateral ribs Lymphadenopathy- none Head- atraumatic            Eyes- Gross vision intact,  +conjunctivae injected            Ears- Hearing, canals-normal            Nose- clear, turbinate edema, no-Septal dev, mucus, polyps, erosion, perforation             Throat- Mallampati III , mucosa clear/ not red , drainage- none, tonsils- atrophic,  Dentures. +hoarseness not worse .                     + Large torus hard palate Neck- flexible , trachea midline, no stridor , thyroid nl, carotid no bruit Chest - symmetrical excursion ,  unlabored           Heart/CV- IRR today, + faint  Mitral regurg murmur , no gallop  ,                               no rub, nl s1 s2                         - JVD- none , edema+2 R>L calf, stasis changes- none, varices- none           Lung-  Wheeze+ slight rattle, Cough-none , dullness-none, rub- none           Chest wall-  Abd-  Br/ Gen/ Rectal- Not done, not  indicated Extrem- cyanosis- none, clubbing, none, atrophy- none, strength- nl Neuro- +slight resting tremor

## 2021-03-14 ENCOUNTER — Encounter: Payer: Self-pay | Admitting: Internal Medicine

## 2021-03-14 ENCOUNTER — Ambulatory Visit: Payer: Medicare PPO | Admitting: Internal Medicine

## 2021-03-14 ENCOUNTER — Other Ambulatory Visit: Payer: Self-pay

## 2021-03-14 VITALS — BP 110/62 | HR 71 | Temp 97.5°F | Ht 66.0 in | Wt 153.4 lb

## 2021-03-14 DIAGNOSIS — J310 Chronic rhinitis: Secondary | ICD-10-CM | POA: Diagnosis not present

## 2021-03-14 DIAGNOSIS — H532 Diplopia: Secondary | ICD-10-CM

## 2021-03-14 DIAGNOSIS — J449 Chronic obstructive pulmonary disease, unspecified: Secondary | ICD-10-CM | POA: Diagnosis not present

## 2021-03-14 NOTE — Patient Instructions (Addendum)
We can continue same breathing meds  Suggest you try an otc nasal saline rinse- like NeilMed, AYR, etc. Try it maybe once or twice daily to get the "glue" out, as needed.  Order- refer to [facility] Neurology   dx diplopia (Central per Ophthalmologist)  Please call if we can help

## 2021-03-18 ENCOUNTER — Telehealth: Payer: Self-pay | Admitting: Internal Medicine

## 2021-03-18 NOTE — Telephone Encounter (Signed)
ATC x1, line was busy.  Will attempt again later.

## 2021-03-18 NOTE — Telephone Encounter (Signed)
There aren't many antibiotics she can tolerate. Since fever is gone for now, suggest she take Mucinex twice daily and try to drink a little more water to loosen the secretions. Continue routine meds and let us know if she doesn't get better.

## 2021-03-18 NOTE — Telephone Encounter (Signed)
Called and spoke with patient. She verbalized understanding. She is aware to call us back if she starts to feel worse.   Nothing further needed at time of call.

## 2021-03-18 NOTE — Telephone Encounter (Signed)
Called and spoke with patient. She stated that she was seen by CY on 03/14/21 and she felt completely fine. On Saturday she woke up with a slight fever of 65F and feeling like the chest congestion had returned. She took some Tylenol and the fever went away. She has been able to cough but the phlegm is too thick for her to cough up.   She denied any body aches or anymore fevers since Saturday morning. She has not been around anyone who has been sick recently. She stated that the nasal sprays that Dr. Erin Fulling prescribed for her did help with the nasal congestion but feels it has moved down to her lungs. She is still using her albuterol HFA and nebs as needed. Still using the Breztri twice daily.   [facility] [place].   Dr. Annamaria Boots, can you please advise? Thanks!

## 2021-04-10 ENCOUNTER — Other Ambulatory Visit: Payer: Self-pay | Admitting: Internal Medicine

## 2021-04-12 ENCOUNTER — Telehealth: Payer: Self-pay | Admitting: Internal Medicine

## 2021-04-12 MED ORDER — PREDNISONE 10 MG PO TABS: 10.0000 mg | ORAL_TABLET | Freq: Every day | ORAL | 0 refills | Status: AC

## 2021-04-12 NOTE — Telephone Encounter (Signed)
Call returned to patient, confirmed DOB. Made aware of TP recommendations. Confirmed pharmacy. Medication sent. Aware to call if not better following prednisone. Voiced understanding.   Nothing further needed at this time.

## 2021-04-12 NOTE — Telephone Encounter (Signed)
Spoke with the pt  She states still having congestion in her chest and wheezing  She called about this 3 wks ago and we told her to her mucinex for her congestion  She states that she stopped mucinex bc it was not helping  She is able to produce some white to yellow sputum  No increased SOB or fever  She using her neb every 6 hours  Asking for pred to her her congestion since this has helped in the past  Please advise thanks!  Allergies  Allergen Reactions   Amoxicillin-Pot Clavulanate Itching and Swelling   Clarithromycin Itching and Swelling   Clindamycin Itching and Swelling   Doxycycline Itching and Swelling   Penicillins Itching and Swelling    Did it involve swelling of the face/tongue/throat, SOB, or low BP? Yes Did it involve sudden or severe rash/hives, skin peeling, or any reaction on the inside of your mouth or nose? No Did you need to seek medical attention at a hospital or doctor's office? Yes When did it last happen? Over 10 years     If all above answers are "NO", may proceed with cephalosporin use.

## 2021-04-12 NOTE — Telephone Encounter (Signed)
Medical history significant for chronic asthma, COPD and chronic sinusitis.  Medical history CHF and cardiomyopathy.  A. fib.  May use albuterol as needed.  Continue on BREZTRI twice daily  We can send and prednisone 20 mg daily for 5 days.  If her mucus and congestion continue may need an antibiotic and call us back if things or not improving of note she just had a prednisone taper in June as well.  May need an office visit with Dr. Annamaria Boots for further evaluation if continues to have persistent symptoms  Please contact office for sooner follow up if symptoms do not improve or worsen or seek emergency care

## 2021-04-24 ENCOUNTER — Ambulatory Visit (INDEPENDENT_AMBULATORY_CARE_PROVIDER_SITE_OTHER): Payer: Medicare PPO | Admitting: *Deleted

## 2021-04-24 ENCOUNTER — Other Ambulatory Visit: Payer: Self-pay

## 2021-04-24 DIAGNOSIS — Z5181 Encounter for therapeutic drug level monitoring: Secondary | ICD-10-CM

## 2021-04-24 DIAGNOSIS — I482 Chronic atrial fibrillation, unspecified: Secondary | ICD-10-CM

## 2021-04-24 LAB — POCT INR: INR: 1.1 — AB (ref 2.0–3.0)

## 2021-04-24 NOTE — Patient Instructions (Signed)
Take warfarin 1 1/2 tablets tonight then resume 1 tablet daily except 1/2 tablet on Mondays, Wednesdays and Fridays Recheck in 2 wks

## 2021-04-30 ENCOUNTER — Other Ambulatory Visit: Payer: Self-pay | Admitting: *Deleted

## 2021-04-30 DIAGNOSIS — Z6823 Body mass index (BMI) 23.0-23.9, adult: Secondary | ICD-10-CM | POA: Diagnosis not present

## 2021-04-30 DIAGNOSIS — J449 Chronic obstructive pulmonary disease, unspecified: Secondary | ICD-10-CM | POA: Diagnosis not present

## 2021-04-30 DIAGNOSIS — Z0000 Encounter for general adult medical examination without abnormal findings: Secondary | ICD-10-CM | POA: Diagnosis not present

## 2021-04-30 DIAGNOSIS — J9801 Acute bronchospasm: Secondary | ICD-10-CM | POA: Diagnosis not present

## 2021-04-30 DIAGNOSIS — E782 Mixed hyperlipidemia: Secondary | ICD-10-CM | POA: Diagnosis not present

## 2021-04-30 DIAGNOSIS — H532 Diplopia: Secondary | ICD-10-CM | POA: Diagnosis not present

## 2021-04-30 DIAGNOSIS — Z1389 Encounter for screening for other disorder: Secondary | ICD-10-CM | POA: Diagnosis not present

## 2021-04-30 DIAGNOSIS — E1129 Type 2 diabetes mellitus with other diabetic kidney complication: Secondary | ICD-10-CM | POA: Diagnosis not present

## 2021-04-30 DIAGNOSIS — J42 Unspecified chronic bronchitis: Secondary | ICD-10-CM | POA: Diagnosis not present

## 2021-04-30 DIAGNOSIS — Z0001 Encounter for general adult medical examination with abnormal findings: Secondary | ICD-10-CM | POA: Diagnosis not present

## 2021-04-30 DIAGNOSIS — N183 Chronic kidney disease, stage 3 unspecified: Secondary | ICD-10-CM | POA: Diagnosis not present

## 2021-04-30 MED ORDER — DILTIAZEM HCL ER COATED BEADS 180 MG PO CP24: 180.0000 mg | ORAL_CAPSULE | Freq: Every day | ORAL | 3 refills | Status: DC

## 2021-05-02 ENCOUNTER — Telehealth: Payer: Self-pay | Admitting: *Deleted

## 2021-05-02 NOTE — Telephone Encounter (Signed)
Sounds good, I agree with plan

## 2021-05-02 NOTE — Telephone Encounter (Signed)
Pt called stating that Dr. Gerarda Fraction put her on Levaquin 500mg  - takes 1 tablet every 24 hrs for 10 days   Says on bottle she will have to cut coumadin in half for 10 days  Please call [phone]

## 2021-05-02 NOTE — Telephone Encounter (Signed)
Called pt back. Pt stated that she started Levaquin 500 mg yesterday. Pt stated that she has not had her INR checked since Lattie Haw checked it last on 8/17.  Informed pt that typically when pt is on an antibiotic that can interfere with warfarin we have pt continue there normal dose of warfarin and then have them come in to have INR checked 3-4 days after starting the antibiotic. Pt stated that she has an appointment on Tuesday at coumadin clinic. However, due to transportation she can not come before Tuesday. Instructed pt to continue he normal dose of warfarin (1 tablet daily except for 1/2 a tablet on Monday, Wednesday and Friday) except for this Saturday take 1/2 a tablet (2.5mg ) instead of 1 tablet (5mg ).  Also, instructed pt to eat an extra serving of greens over the week end. Pt stated that she would because she she "loves greens". Pt will come in on 8/30 to have INR checked at the [place] office.

## 2021-05-07 NOTE — Telephone Encounter (Signed)
Patient called requesting to speak with Edrick Oh, RN in regards to her being dizzy.

## 2021-05-07 NOTE — Telephone Encounter (Signed)
Pt states she was to dizzy to come for appt today.  Rescheduled for 05/09/21.

## 2021-05-08 ENCOUNTER — Other Ambulatory Visit: Payer: Self-pay | Admitting: Cardiology

## 2021-05-15 ENCOUNTER — Ambulatory Visit (INDEPENDENT_AMBULATORY_CARE_PROVIDER_SITE_OTHER): Payer: Medicare PPO | Admitting: *Deleted

## 2021-05-15 DIAGNOSIS — Z5181 Encounter for therapeutic drug level monitoring: Secondary | ICD-10-CM

## 2021-05-15 DIAGNOSIS — I482 Chronic atrial fibrillation, unspecified: Secondary | ICD-10-CM

## 2021-05-15 LAB — POCT INR: INR: 1.1 — AB (ref 2.0–3.0)

## 2021-05-15 NOTE — Patient Instructions (Signed)
Increase warfarin to 1 tablet daily except 1/2 tablet on Sundays Recheck in 1 wk

## 2021-05-30 ENCOUNTER — Encounter (HOSPITAL_COMMUNITY): Payer: Self-pay | Admitting: Physical Therapy

## 2021-05-30 NOTE — Therapy (Signed)
[place], [place], [address] Phone: [phone]   Fax:  [phone]  Patient Details  Name: [name] MRN: [id number] Date of Birth: Feb 01, 1934 Referring Provider:  No ref. provider found  Encounter Date: 05/30/2021   PHYSICAL THERAPY DISCHARGE SUMMARY  Visits from Start of Care: 2  Current functional level related to goals / functional outcomes: Unknown as patient has not returned.    Remaining deficits: Unknown as patient has not returned.    Education / Equipment: HEP   Patient agrees to discharge. Patient goals were not met. Patient is being discharged due to not returning since the last visit.   10:28 AM, 05/30/21 Mearl Latin PT, DPT Physical Therapist at [place], [place], [address] Phone: [phone]   Fax:  [phone]

## 2021-07-22 DIAGNOSIS — H532 Diplopia: Secondary | ICD-10-CM | POA: Insufficient documentation

## 2021-07-22 NOTE — Assessment & Plan Note (Signed)
No acute exacerbation Plan- continue current meds

## 2021-07-22 NOTE — Assessment & Plan Note (Signed)
Refer to Neurology

## 2021-07-22 NOTE — Assessment & Plan Note (Signed)
Recent exacerbation. Plan- saline rinse. Avoid over-drying from ipratropium

## 2021-07-23 ENCOUNTER — Ambulatory Visit (INDEPENDENT_AMBULATORY_CARE_PROVIDER_SITE_OTHER): Payer: Medicare PPO | Admitting: *Deleted

## 2021-07-23 DIAGNOSIS — I482 Chronic atrial fibrillation, unspecified: Secondary | ICD-10-CM

## 2021-07-23 DIAGNOSIS — Z5181 Encounter for therapeutic drug level monitoring: Secondary | ICD-10-CM | POA: Diagnosis not present

## 2021-07-23 LAB — POCT INR: INR: 1 — AB (ref 2.0–3.0)

## 2021-07-23 NOTE — Patient Instructions (Signed)
Restart warfarin 1 tablet daily except 1/2 tablet on Sundays Recheck in 2 wk

## 2021-09-04 ENCOUNTER — Other Ambulatory Visit: Payer: Self-pay | Admitting: Internal Medicine

## 2021-09-13 NOTE — Progress Notes (Deleted)
Patient ID: [name], female    DOB: [birth year], 88 y.o.   MRN: [id number]  HPI F former smoker, followed for Chronic asthma/ COPD/ Xolair, chronic rhinosinusitis, complicated by hx AFib/ anticoagulation, CHF, cardiomyopathy, mitral insufficiency, GERD, CAD Echocardiogram-atrial fibrillation, LVEF 30-35%, diffuse hypokinesis  ---------------------------------------------------------------------------------------------------------   03/14/21- 88 year old female former smoker followed for chronic asthma/COPD, Chronic Rhinosinusitis, lung Nodules complicated by history A. Fib/anticoagulation/ coumadin, CHF, cardiomyopathy, mitral stenosis, GERD Barrett's, CAD -----Neb albuterol, Ventolin hfa, Breztri, ipratropium nasal spray,  Covid vax- 3 Moderna Body weight today-153 lbs -----Doing ok, issues with allergies Called 6/30> pred taper and ipratropium nasal spray ACT score 14 Thick clear nasal mucus. Prednisone taper in June. Occasional rescue inhaler 2-3x/ week. Tearful- Niece/companion died renal failure. Noting some blurred vision. Saw Ophthalmologist > diplopia  09/16/21- 88 year old female former smoker followed for chronic asthma/COPD, Chronic Rhinosinusitis, lung Nodules complicated by  A. Fib/anticoagulation/ coumadin, CHF, cardiomyopathy, mitral stenosis, GERD Barrett's, CAD -----Neb albuterol, Ventolin hfa, Breztri, ipratropium nasal spray,  Covid vax- 3 Moderna Flu vax- Body weight today-   Review of Systems-see HPI   + = positive Constitutional:    weight loss, night sweats, fevers, chills, +fatigue, lassitude. HEENT:   No-  headaches, difficulty swallowing, tooth/dental problems, sore throat,  hoarseness      No-  sneezing, itching, ear ache, no- nasal congestion, +post nasal drip,  CV:  No- chest pain, no-orthopnea, PND, +swelling in lower extremities, anasarca, dizziness, palpitations Resp: +   shortness of breath with exertion or at rest.              No-   productive  cough,  non-productive cough,  No- coughing up of blood.              No-   change in color of mucus.  wheezing.   Skin: No-   rash or lesions. GI:  No-   heartburn, indigestion, abdominal pain, nausea, vomiting,  GU: . MS:  No-   joint pain or swelling.  Neuro-     nothing unusual Psych:  No- change in mood or affect. No depression or anxiety.  No memory loss.  Objective:   Physical Exam General- Alert, Oriented, Affect-appropriate/ cheerful, Distress- none acute, thin Skin- no rash visible now on right lateral ribs Lymphadenopathy- none Head- atraumatic            Eyes- Gross vision intact,  +conjunctivae injected            Ears- Hearing, canals-normal            Nose- clear, turbinate edema, no-Septal dev, mucus, polyps, erosion, perforation             Throat- Mallampati III , mucosa clear/ not red , drainage- none, tonsils- atrophic,  Dentures. +hoarseness not worse .                     + Large torus hard palate Neck- flexible , trachea midline, no stridor , thyroid nl, carotid no bruit Chest - symmetrical excursion , unlabored           Heart/CV- IRR today, + faint  Mitral regurg murmur , no gallop  ,                               no rub, nl s1 s2                         -  JVD- none , edema+2 R>L calf, stasis changes- none, varices- none           Lung-  Wheeze+ slight rattle, Cough-none , dullness-none, rub- none           Chest wall-  Abd-  Br/ Gen/ Rectal- Not done, not indicated Extrem- cyanosis- none, clubbing, none, atrophy- none, strength- nl Neuro- +slight resting tremor

## 2021-09-16 ENCOUNTER — Ambulatory Visit: Payer: Medicare PPO | Admitting: Internal Medicine

## 2021-11-14 DIAGNOSIS — I10 Essential (primary) hypertension: Secondary | ICD-10-CM | POA: Diagnosis not present

## 2021-11-14 DIAGNOSIS — I4891 Unspecified atrial fibrillation: Secondary | ICD-10-CM | POA: Diagnosis not present

## 2021-11-14 DIAGNOSIS — Z6822 Body mass index (BMI) 22.0-22.9, adult: Secondary | ICD-10-CM | POA: Diagnosis not present

## 2021-11-14 DIAGNOSIS — E663 Overweight: Secondary | ICD-10-CM | POA: Diagnosis not present

## 2021-11-14 DIAGNOSIS — J441 Chronic obstructive pulmonary disease with (acute) exacerbation: Secondary | ICD-10-CM | POA: Diagnosis not present

## 2021-11-14 DIAGNOSIS — N183 Chronic kidney disease, stage 3 unspecified: Secondary | ICD-10-CM | POA: Diagnosis not present

## 2021-11-14 DIAGNOSIS — E1129 Type 2 diabetes mellitus with other diabetic kidney complication: Secondary | ICD-10-CM | POA: Diagnosis not present

## 2021-11-18 ENCOUNTER — Other Ambulatory Visit (HOSPITAL_COMMUNITY): Payer: Self-pay | Admitting: Internal Medicine

## 2021-11-18 DIAGNOSIS — R059 Cough, unspecified: Secondary | ICD-10-CM

## 2021-12-12 ENCOUNTER — Emergency Department (HOSPITAL_COMMUNITY): Payer: Medicare PPO

## 2021-12-12 ENCOUNTER — Emergency Department (HOSPITAL_COMMUNITY)
Admission: EM | Admit: 2021-12-12 | Discharge: 2021-12-12 | Disposition: A | Discharge status: Home / Self Care | Payer: Medicare PPO | Attending: Emergency Medicine | Admitting: Emergency Medicine

## 2021-12-12 ENCOUNTER — Other Ambulatory Visit: Payer: Self-pay

## 2021-12-12 ENCOUNTER — Encounter (HOSPITAL_COMMUNITY): Payer: Self-pay | Admitting: *Deleted

## 2021-12-12 DIAGNOSIS — Z6822 Body mass index (BMI) 22.0-22.9, adult: Secondary | ICD-10-CM | POA: Diagnosis not present

## 2021-12-12 DIAGNOSIS — Z7901 Long term (current) use of anticoagulants: Secondary | ICD-10-CM | POA: Insufficient documentation

## 2021-12-12 DIAGNOSIS — I10 Essential (primary) hypertension: Secondary | ICD-10-CM | POA: Diagnosis not present

## 2021-12-12 DIAGNOSIS — R791 Abnormal coagulation profile: Secondary | ICD-10-CM | POA: Insufficient documentation

## 2021-12-12 DIAGNOSIS — I4891 Unspecified atrial fibrillation: Secondary | ICD-10-CM | POA: Insufficient documentation

## 2021-12-12 DIAGNOSIS — J449 Chronic obstructive pulmonary disease, unspecified: Secondary | ICD-10-CM | POA: Diagnosis not present

## 2021-12-12 DIAGNOSIS — J441 Chronic obstructive pulmonary disease with (acute) exacerbation: Secondary | ICD-10-CM | POA: Diagnosis not present

## 2021-12-12 DIAGNOSIS — N183 Chronic kidney disease, stage 3 unspecified: Secondary | ICD-10-CM | POA: Diagnosis not present

## 2021-12-12 DIAGNOSIS — J90 Pleural effusion, not elsewhere classified: Secondary | ICD-10-CM | POA: Diagnosis not present

## 2021-12-12 DIAGNOSIS — R0602 Shortness of breath: Secondary | ICD-10-CM | POA: Diagnosis not present

## 2021-12-12 DIAGNOSIS — I509 Heart failure, unspecified: Secondary | ICD-10-CM | POA: Diagnosis not present

## 2021-12-12 DIAGNOSIS — I110 Hypertensive heart disease with heart failure: Secondary | ICD-10-CM | POA: Diagnosis not present

## 2021-12-12 LAB — CBC WITH DIFFERENTIAL/PLATELET
Abs Immature Granulocytes: 0.02 10*3/uL (ref 0.00–0.07)
Basophils Absolute: 0 10*3/uL (ref 0.0–0.1)
Basophils Relative: 1 %
Eosinophils Absolute: 0 10*3/uL (ref 0.0–0.5)
Eosinophils Relative: 1 %
HCT: 39.8 % (ref 36.0–46.0)
Hemoglobin: 12.9 g/dL (ref 12.0–15.0)
Immature Granulocytes: 0 %
Lymphocytes Relative: 20 %
Lymphs Abs: 0.9 10*3/uL (ref 0.7–4.0)
MCH: 29.1 pg (ref 26.0–34.0)
MCHC: 32.4 g/dL (ref 30.0–36.0)
MCV: 89.8 fL (ref 80.0–100.0)
Monocytes Absolute: 0.4 10*3/uL (ref 0.1–1.0)
Monocytes Relative: 9 %
Neutro Abs: 3.2 10*3/uL (ref 1.7–7.7)
Neutrophils Relative %: 69 %
Platelets: 180 10*3/uL (ref 150–400)
RBC: 4.43 MIL/uL (ref 3.87–5.11)
RDW: 14.8 % (ref 11.5–15.5)
WBC: 4.5 10*3/uL (ref 4.0–10.5)
nRBC: 0 % (ref 0.0–0.2)

## 2021-12-12 LAB — TROPONIN I (HIGH SENSITIVITY)
Troponin I (High Sensitivity): 23 ng/L — ABNORMAL HIGH (ref <18)
Troponin I (High Sensitivity): 20 ng/L — ABNORMAL HIGH (ref <18)

## 2021-12-12 LAB — COMPREHENSIVE METABOLIC PANEL
ALT: 40 U/L (ref 0–44)
AST: 26 U/L (ref 15–41)
Albumin: 3.6 g/dL (ref 3.5–5.0)
Alkaline Phosphatase: 85 U/L (ref 38–126)
Anion gap: 12 (ref 5–15)
BUN: 28 mg/dL — ABNORMAL HIGH (ref 8–23)
CO2: 25 mmol/L (ref 22–32)
Calcium: 9 mg/dL (ref 8.9–10.3)
Chloride: 103 mmol/L (ref 98–111)
Creatinine, Ser: 1.41 mg/dL — ABNORMAL HIGH (ref 0.44–1.00)
GFR, Estimated: 37 mL/min — ABNORMAL LOW (ref >60)
Glucose, Bld: 111 mg/dL — ABNORMAL HIGH (ref 70–99)
Potassium: 3.8 mmol/L (ref 3.5–5.1)
Sodium: 140 mmol/L (ref 135–145)
Total Bilirubin: 1 mg/dL (ref 0.3–1.2)
Total Protein: 7 g/dL (ref 6.5–8.1)

## 2021-12-12 LAB — BRAIN NATRIURETIC PEPTIDE: B Natriuretic Peptide: 573 pg/mL — ABNORMAL HIGH (ref 0.0–100.0)

## 2021-12-12 LAB — PROTIME-INR
INR: 1.1 (ref 0.8–1.2)
Prothrombin Time: 13.7 seconds (ref 11.4–15.2)

## 2021-12-12 MED ORDER — FUROSEMIDE 10 MG/ML IJ SOLN
40.0000 mg | Freq: Once | INTRAMUSCULAR | Status: AC
  Administered 2021-12-12: 40 mg via INTRAVENOUS
  Filled 2021-12-12: qty 4

## 2021-12-12 MED ORDER — DILTIAZEM HCL 25 MG/5ML IV SOLN
10.0000 mg | Freq: Once | INTRAVENOUS | Status: AC
Start: 2021-12-12 — End: 2021-12-12
  Administered 2021-12-12: 10 mg via INTRAVENOUS
  Filled 2021-12-12: qty 5

## 2021-12-12 MED ORDER — FUROSEMIDE 40 MG PO TABS: 40.0000 mg | ORAL_TABLET | Freq: Every day | ORAL | 0 refills | Status: AC

## 2021-12-12 MED ORDER — DILTIAZEM HCL ER COATED BEADS 180 MG PO CP24
180.0000 mg | ORAL_CAPSULE | Freq: Once | ORAL | Status: AC
Start: 2021-12-12 — End: 2021-12-12
  Administered 2021-12-12: 180 mg via ORAL
  Filled 2021-12-12: qty 1

## 2021-12-12 NOTE — ED Notes (Signed)
Patient given some graham crackers and Coke - dr. Approved  ?

## 2021-12-12 NOTE — ED Provider Notes (Addendum)
?[facility] ?Provider Note ? ? ?CSN: [id number] ?Arrival date & time: 12/12/21  0815 ? ?  ? ?History ? ?Chief Complaint  ?Patient presents with  ? Shortness of Breath  ? ? ?[name] is a 88 y.o. female. ? ?HPI ?88 year old female presents from [place] office with A-fib and CHF.  History comes from both patient as well as her PCP who I discussed her case with.  Dr. Gerarda Fraction reports the patient was in A-fib with RVR and some mild hypoxia in the high 80s with concern for CHF.  Patient reports she has been feeling short of breath for about a week and has has an "anxious feeling" that she has had before with A-fib over the last few days.  No chest pain or fevers or cough.  Some leg swelling for the past several days as well.  She has not taken her home meds yet today. ? ?Patient has multiple comorbidities which include COPD, A-fib, nonischemic cardiomyopathy. ? ?Home Medications ?Prior to Admission medications   ?Medication Sig Start Date End Date Taking? Authorizing Provider  ?furosemide (LASIX) 40 MG tablet Take 1 tablet (40 mg total) by mouth daily. 12/12/21  Yes Sherwood Gambler, MD  ?acetaminophen (TYLENOL) 325 MG tablet Take 2 tablets (650 mg total) by mouth every 6 (six) hours as needed for mild pain, fever or headache. 05/03/19   Meuth, Jerene Pitch A, PA-C  ?albuterol (PROVENTIL) (2.5 MG/3ML) 0.083% nebulizer solution USE 1 VIAL IN NEBULIZER EVERY 6 HOURS AS NEEDED FOR WHEEZING OR SHORTNESS OF BREATH 04/10/21   [name] D, MD  ?albuterol (VENTOLIN HFA) 108 (90 Base) MCG/ACT inhaler TAKE 2 PUFFS BY MOUTH EVERY 6 HOURS AS NEEDED FOR WHEEZE OR SHORTNESS OF BREATH 09/05/21   [name] D, MD  ?Budeson-Glycopyrrol-Formoterol (BREZTRI AEROSPHERE) 160-9-4.8 MCG/ACT AERO Inhale 2 puffs into the lungs in the morning and at bedtime. 05/16/20   [name] D, MD  ?digoxin (LANOXIN) 0.125 MG tablet TAKE 1 TABLET EVERY DAY 05/08/21   Satira Sark, MD  ?diltiazem (CARDIZEM CD) 180 MG 24 hr capsule  Take 1 capsule (180 mg total) by mouth daily. 04/30/21 07/29/21  Satira Sark, MD  ?docusate sodium (COLACE) 100 MG capsule Take 100 mg by mouth 2 (two) times daily.    [provider]  ?escitalopram (LEXAPRO) 10 MG tablet Take 1 tablet (10 mg total) by mouth every morning. 05/20/19   Gerlene Fee, NP  ?esomeprazole (NEXIUM) 40 MG capsule Take 1 capsule (40 mg total) by mouth 2 (two) times daily. 05/20/19   Gerlene Fee, NP  ?furosemide (LASIX) 40 MG tablet TAKE 1 TABLET BY MOUTH EVERY DAY ?Patient taking differently: 80 mg. 11/15/20   Satira Sark, MD  ?ipratropium (ATROVENT) 0.03 % nasal spray Place 2 sprays into both nostrils every 12 (twelve) hours. 03/07/21   Freddi Starr, MD  ?NON FORMULARY Diet- Regular    [provider]  ?phenylephrine-shark liver oil-mineral oil-petrolatum (PREPARATION H) 0.25-3-14-71.9 % rectal ointment Place 1 application rectally 2 (two) times daily as needed for hemorrhoids.    [provider]  ?polyethylene glycol (MIRALAX / GLYCOLAX) 17 g packet Take 17 g by mouth daily. 05/03/19   Meuth, Brooke A, PA-C  ?potassium chloride SA (K-DUR) 20 MEQ tablet Take 1 tablet (20 mEq total) by mouth daily. 05/20/19   Gerlene Fee, NP  ?predniSONE (DELTASONE) 10 MG tablet Take 4 tabs for 2 days, then 3 tabs for 2 days, 2 tabs for  2 days, then 1 tab for 2 days, then stop. 03/07/21   Freddi Starr, MD  ?warfarin (COUMADIN) 5 MG tablet TAKE 1 TABLET BY MOUTH EVERY DAY OR AS DIRECTED 10/31/20   Satira Sark, MD  ?   ? ?Allergies    ?Amoxicillin-pot clavulanate, Clarithromycin, Clindamycin, Doxycycline, and Penicillins   ? ?Review of Systems   ?Review of Systems  ?Constitutional:  Negative for fever.  ?Respiratory:  Positive for shortness of breath. Negative for cough.   ?Cardiovascular:  Positive for leg swelling. Negative for chest pain and palpitations.  ? ?Physical Exam ?Updated Vital Signs ?BP 127/69   Pulse 90   Temp 97.7 ?F (36.5 ?C)  (Oral)   Resp 20   Ht '5\' 6"'$  (1.676 m)   Wt 60.8 kg   SpO2 97%   BMI 21.63 kg/m?  ?Physical Exam ?Vitals and nursing note reviewed.  ?Constitutional:   ?   Appearance: She is well-developed. She is not ill-appearing or diaphoretic.  ?HENT:  ?   Head: Normocephalic and atraumatic.  ?Cardiovascular:  ?   Rate and Rhythm: Tachycardia present. Rhythm irregular.  ?   Heart sounds: Normal heart sounds.  ?Pulmonary:  ?   Effort: Pulmonary effort is normal. No accessory muscle usage or respiratory distress.  ?   Breath sounds: Normal breath sounds.  ?Abdominal:  ?   Palpations: Abdomen is soft.  ?   Tenderness: There is no abdominal tenderness.  ?Musculoskeletal:  ?   Right lower leg: Edema present.  ?   Left lower leg: Edema present.  ?   Comments: Nonpitting edema to bilateral ankles and perhaps lower legs  ?Skin: ?   General: Skin is warm and dry.  ?Neurological:  ?   Mental Status: She is alert.  ? ? ?ED Results / Procedures / Treatments   ?Labs ?(all labs ordered are listed, but only abnormal results are displayed) ?Labs Reviewed  ?COMPREHENSIVE METABOLIC PANEL - Abnormal; Notable for the following components:  ?    Result Value  ? Glucose, Bld 111 (*)   ? BUN 28 (*)   ? Creatinine, Ser 1.41 (*)   ? GFR, Estimated 37 (*)   ? All other components within normal limits  ?BRAIN NATRIURETIC PEPTIDE - Abnormal; Notable for the following components:  ? B Natriuretic Peptide 573.0 (*)   ? All other components within normal limits  ?TROPONIN I (HIGH SENSITIVITY) - Abnormal; Notable for the following components:  ? Troponin I (High Sensitivity) 23 (*)   ? All other components within normal limits  ?TROPONIN I (HIGH SENSITIVITY) - Abnormal; Notable for the following components:  ? Troponin I (High Sensitivity) 20 (*)   ? All other components within normal limits  ?CBC WITH DIFFERENTIAL/PLATELET  ?PROTIME-INR  ? ? ?EKG ?EKG Interpretation ? ?Date/Time:  Thursday December 12 2021 08:27:39 EDT ?Ventricular Rate:  155 ?PR  Interval:    ?QRS Duration: 88 ?QT Interval:  299 ?QTC Calculation: 481 ?R Axis:   -68 ?Text Interpretation: Atrial fibrillation with rapid V-rate Left anterior fascicular block Low voltage, extremity leads Borderline T abnormalities, lateral leads Confirmed by Sherwood Gambler [phone]) on 12/12/2021 9:09:14 AM ? ?Radiology ?DG Chest 2 View ? ?Result Date: 12/12/2021 ?CLINICAL DATA:  Short of breath EXAM: CHEST - 2 VIEW COMPARISON:  None 321 FINDINGS: Stable enlarged cardiac silhouette. There is bilateral small effusions.) Increased RIGHT basilar density compared to prior. Mild central venous congestion. No pneumothorax. IMPRESSION: 1. Bilateral pleural effusions. 2. Increased RIGHT basilar  atelectasis. Mass or infiltrate less favored. Electronically Signed   By: Suzy Bouchard M.D.   On: 12/12/2021 09:25   ? ?Procedures ?Procedures  ? ? ?Medications Ordered in ED ?Medications  ?diltiazem (CARDIZEM CD) 24 hr capsule 180 mg (180 mg Oral Given 12/12/21 0923)  ?diltiazem (CARDIZEM) injection 10 mg (10 mg Intravenous Given 12/12/21 0923)  ?furosemide (LASIX) injection 40 mg (40 mg Intravenous Given 12/12/21 1012)  ? ? ?ED Course/ Medical Decision Making/ A&P ?  ?                        ?Medical Decision Making ?Amount and/or Complexity of Data Reviewed ?Labs: ordered. ?Radiology: ordered. ? ?Risk ?Prescription drug management. ? ? ?Patient sent over from her doctor's office for A-fib with RVR and probable CHF.  Her A-fib with RVR, which is apparent on cardiac monitoring and the ECG that I reviewed/interpreted, was rate controlled with her oral Cardizem and IV Cardizem bolus.  She does have some shortness of breath but no hypoxia.  Chest x-ray images viewed/interpreted by myself and she has pleural effusions consistent with fluid overload.  However she has no increased work of breathing and feels well.  She was given IV Lasix.  Vital signs have normalized.  She was able to ambulate without hypoxia or shortness of breath.  I  offered observation admission with further diuresis and rate control though she declines and would like to go home.  I do not think this is unreasonable.  She does not know if she still has any Lasix left so I will give her a

## 2021-12-12 NOTE — ED Triage Notes (Signed)
Pt c/o SOB for the past few days. Denies chest pain. Pt reports this is completely new and she never feels SOB. Denies SOB being worse with exertion.  ?

## 2021-12-12 NOTE — Discharge Instructions (Addendum)
If you develop new or worsening palpitations, chest pain, shortness of breath, or any other new/concerning symptoms then return to the ER for evaluation. ? ?Your Coumadin level/INR was subtherapeutic today.  It is very important to get back on your warfarin and take it as instructed each day and get your INR rechecked on Monday, 4/10.  This is important to help prevent stroke from atrial fibrillation. ? ?You were given a dose of IV Lasix today and you need to restart Lasix at home each day. ?

## 2021-12-12 NOTE — ED Notes (Signed)
Patient ambulated to restroom and down the hall.  Patient's o2 sat maintained at 97-99%.  Patient stated she "felt tight in her chest but she felt that was due to her allergy to pollen" ?

## 2022-01-13 ENCOUNTER — Ambulatory Visit: Payer: Medicare PPO | Admitting: Cardiology

## 2022-01-13 ENCOUNTER — Encounter: Payer: Self-pay | Admitting: Cardiology

## 2022-01-13 NOTE — Progress Notes (Deleted)
Cardiology Office Note  Date: 01/13/2022   ID: [name], DOB 07-Jun-1934, MRN [id number]  PCP:  Redmond School, MD  Cardiologist:  [name], MD Electrophysiologist:  None   No chief complaint on file.   History of Present Illness: [name] is an 88 y.o. female last seen in June 2022.  She was seen in the ER in April with atrial fibrillation and RVR.  INR found to be subtherapeutic and she had not been consistently taking her Coumadin.  She was treated with Lasix, ultimately heart rate came down without specific intervention.  She was not hospitalized at that time  Past Medical History:  Diagnosis Date   Asthma    Atrial fibrillation (HCC)    Chronic rhinitis    COPD (chronic obstructive pulmonary disease) (HCC)    GERD (gastroesophageal reflux disease)    Mitral regurgitation    Mitral valve prolapse    Nasal polyps    Nonischemic cardiomyopathy (HCC)    LVEF 25-30% improved to 45-50%   Obstructive sleep apnea    Transudative pleural effusion     Past Surgical History:  Procedure Laterality Date   APPENDECTOMY  2000   Bilateral cataract surgery     LAPAROSCOPIC NISSEN FUNDOPLICATION  2979   NOSE SURGERY      Current Outpatient Medications  Medication Sig Dispense Refill   acetaminophen (TYLENOL) 325 MG tablet Take 2 tablets (650 mg total) by mouth every 6 (six) hours as needed for mild pain, fever or headache.     albuterol (PROVENTIL) (2.5 MG/3ML) 0.083% nebulizer solution USE 1 VIAL IN NEBULIZER EVERY 6 HOURS AS NEEDED FOR WHEEZING OR SHORTNESS OF BREATH 75 mL 12   albuterol (VENTOLIN HFA) 108 (90 Base) MCG/ACT inhaler TAKE 2 PUFFS BY MOUTH EVERY 6 HOURS AS NEEDED FOR WHEEZE OR SHORTNESS OF BREATH 6.7 each 5   Budeson-Glycopyrrol-Formoterol (BREZTRI AEROSPHERE) 160-9-4.8 MCG/ACT AERO Inhale 2 puffs into the lungs in the morning and at bedtime. 10.7 g 5   digoxin (LANOXIN) 0.125 MG tablet TAKE 1 TABLET EVERY DAY 90 tablet 1   diltiazem (CARDIZEM  CD) 180 MG 24 hr capsule Take 1 capsule (180 mg total) by mouth daily. 90 capsule 3   docusate sodium (COLACE) 100 MG capsule Take 100 mg by mouth 2 (two) times daily.     escitalopram (LEXAPRO) 10 MG tablet Take 1 tablet (10 mg total) by mouth every morning. 30 tablet 0   esomeprazole (NEXIUM) 40 MG capsule Take 1 capsule (40 mg total) by mouth 2 (two) times daily. 60 capsule 0   furosemide (LASIX) 40 MG tablet TAKE 1 TABLET BY MOUTH EVERY DAY (Patient taking differently: 80 mg.) 90 tablet 3   furosemide (LASIX) 40 MG tablet Take 1 tablet (40 mg total) by mouth daily. 30 tablet 0   ipratropium (ATROVENT) 0.03 % nasal spray Place 2 sprays into both nostrils every 12 (twelve) hours. 30 mL 4   NON FORMULARY Diet- Regular     phenylephrine-shark liver oil-mineral oil-petrolatum (PREPARATION H) 0.25-3-14-71.9 % rectal ointment Place 1 application rectally 2 (two) times daily as needed for hemorrhoids.     polyethylene glycol (MIRALAX / GLYCOLAX) 17 g packet Take 17 g by mouth daily. 14 each 0   potassium chloride SA (K-DUR) 20 MEQ tablet Take 1 tablet (20 mEq total) by mouth daily. 30 tablet 0   predniSONE (DELTASONE) 10 MG tablet Take 4 tabs for 2 days, then 3 tabs for 2 days, 2 tabs  for 2 days, then 1 tab for 2 days, then stop. 20 tablet 0   warfarin (COUMADIN) 5 MG tablet TAKE 1 TABLET BY MOUTH EVERY DAY OR AS DIRECTED 90 tablet 3   No current facility-administered medications for this visit.   Allergies:  Amoxicillin-pot clavulanate, Clarithromycin, Clindamycin, Doxycycline, and Penicillins   Social History: The patient  reports that she quit smoking about 41 years ago. Her smoking use included cigarettes. She has a 10.00 pack-year smoking history. She has never used smokeless tobacco. She reports that she does not drink alcohol and does not use drugs.   Family History: The patient's family history includes Atrial fibrillation in her sister; Colon cancer in her father and mother; Lung cancer in  her brother and brother.   ROS:  Please see the history of present illness. Otherwise, complete review of systems is positive for {NONE DEFAULTED:18576}.  All other systems are reviewed and negative.   Physical Exam: VS:  There were no vitals taken for this visit., BMI There is no height or weight on file to calculate BMI.  Wt Readings from Last 3 Encounters:  12/12/21 133 lb 15.9 oz (60.8 kg)  03/14/21 153 lb 6.4 oz (69.6 kg)  03/05/21 151 lb 9.6 oz (68.8 kg)    General: Patient appears comfortable at rest. HEENT: Conjunctiva and lids normal, oropharynx clear with moist mucosa. Neck: Supple, no elevated JVP or carotid bruits, no thyromegaly. Lungs: Clear to auscultation, nonlabored breathing at rest. Cardiac: Regular rate and rhythm, no S3 or significant systolic murmur, no pericardial rub. Abdomen: Soft, nontender, no hepatomegaly, bowel sounds present, no guarding or rebound. Extremities: No pitting edema, distal pulses 2+. Skin: Warm and dry. Musculoskeletal: No kyphosis. Neuropsychiatric: Alert and oriented x3, affect grossly appropriate.  ECG:  An ECG dated 12/12/2021 was personally reviewed today and demonstrated:  Atrial fibrillation with RVR, low voltage in the limb leads.  Recent Labwork: 12/12/2021: ALT 40; AST 26; B Natriuretic Peptide 573.0; BUN 28; Creatinine, Ser 1.41; Hemoglobin 12.9; Platelets 180; Potassium 3.8; Sodium 140   Other Studies Reviewed Today:  Echocardiogram 08/22/2020:  1. Left ventricular ejection fraction, by estimation, is 55 to 60%. The  left ventricle has normal function. The left ventricle has no regional  wall motion abnormalities. There is severe left ventricular hypertrophy.  Left ventricular diastolic parameters   are indeterminate.   2. Right ventricular systolic function is normal. The right ventricular  size is normal.   3. Left atrial size was severely dilated.   4. Right atrial size was moderately dilated.   5. The mitral valve is  abnormal. Mild mitral valve regurgitation.  Moderate to severe mitral stenosis. Severe mitral annular calcification.  The mean mitral valve gradient is 10.1 mmHg with average heart rate of 54  bpm.   6. The aortic valve is tricuspid. There is mild calcification of the  aortic valve. There is mild thickening of the aortic valve. Aortic valve  regurgitation is mild. No aortic stenosis is present.   7. The inferior vena cava is normal in size with greater than 50%  respiratory variability, suggesting right atrial pressure of 3 mmHg.   Chest x-ray 12/12/2021: FINDINGS: Stable enlarged cardiac silhouette. There is bilateral small effusions.) Increased RIGHT basilar density compared to prior. Mild central venous congestion. No pneumothorax.   IMPRESSION: 1. Bilateral pleural effusions. 2. Increased RIGHT basilar atelectasis. Mass or infiltrate less favored.  Assessment and Plan:   Medication Adjustments/Labs and Tests Ordered: Current medicines are reviewed at length  with the patient today.  Concerns regarding medicines are outlined above.   Tests Ordered: No orders of the defined types were placed in this encounter.   Medication Changes: No orders of the defined types were placed in this encounter.   Disposition:  Follow up {follow up:15908}  Signed, Satira Sark, MD, [facility] 01/13/2022 10:11 AM    [facility] [place]. [address] [place],  [address] Phone: [phone]; Fax: [phone]

## 2022-01-22 ENCOUNTER — Emergency Department (HOSPITAL_COMMUNITY): Payer: Medicare PPO

## 2022-01-22 ENCOUNTER — Inpatient Hospital Stay (HOSPITAL_COMMUNITY)
Admission: EM | Admit: 2022-01-22 | Discharge: 2022-02-06 | DRG: 291 | Disposition: E | Payer: Medicare PPO | Attending: Internal Medicine | Admitting: Internal Medicine

## 2022-01-22 ENCOUNTER — Encounter (HOSPITAL_COMMUNITY): Payer: Self-pay

## 2022-01-22 ENCOUNTER — Other Ambulatory Visit: Payer: Self-pay

## 2022-01-22 DIAGNOSIS — I248 Other forms of acute ischemic heart disease: Secondary | ICD-10-CM | POA: Diagnosis present

## 2022-01-22 DIAGNOSIS — R1310 Dysphagia, unspecified: Secondary | ICD-10-CM | POA: Diagnosis present

## 2022-01-22 DIAGNOSIS — Z87891 Personal history of nicotine dependence: Secondary | ICD-10-CM

## 2022-01-22 DIAGNOSIS — E875 Hyperkalemia: Secondary | ICD-10-CM | POA: Diagnosis present

## 2022-01-22 DIAGNOSIS — L89152 Pressure ulcer of sacral region, stage 2: Secondary | ICD-10-CM | POA: Diagnosis present

## 2022-01-22 DIAGNOSIS — Z20822 Contact with and (suspected) exposure to covid-19: Secondary | ICD-10-CM | POA: Diagnosis present

## 2022-01-22 DIAGNOSIS — E1122 Type 2 diabetes mellitus with diabetic chronic kidney disease: Secondary | ICD-10-CM | POA: Diagnosis present

## 2022-01-22 DIAGNOSIS — I130 Hypertensive heart and chronic kidney disease with heart failure and stage 1 through stage 4 chronic kidney disease, or unspecified chronic kidney disease: Secondary | ICD-10-CM | POA: Diagnosis not present

## 2022-01-22 DIAGNOSIS — I82622 Acute embolism and thrombosis of deep veins of left upper extremity: Secondary | ICD-10-CM | POA: Diagnosis present

## 2022-01-22 DIAGNOSIS — R570 Cardiogenic shock: Secondary | ICD-10-CM

## 2022-01-22 DIAGNOSIS — N170 Acute kidney failure with tubular necrosis: Secondary | ICD-10-CM | POA: Diagnosis not present

## 2022-01-22 DIAGNOSIS — I420 Dilated cardiomyopathy: Secondary | ICD-10-CM | POA: Diagnosis present

## 2022-01-22 DIAGNOSIS — J9601 Acute respiratory failure with hypoxia: Secondary | ICD-10-CM | POA: Diagnosis not present

## 2022-01-22 DIAGNOSIS — N179 Acute kidney failure, unspecified: Secondary | ICD-10-CM | POA: Diagnosis not present

## 2022-01-22 DIAGNOSIS — R9431 Abnormal electrocardiogram [ECG] [EKG]: Secondary | ICD-10-CM | POA: Diagnosis not present

## 2022-01-22 DIAGNOSIS — I950 Idiopathic hypotension: Secondary | ICD-10-CM | POA: Diagnosis not present

## 2022-01-22 DIAGNOSIS — Z7189 Other specified counseling: Secondary | ICD-10-CM | POA: Diagnosis not present

## 2022-01-22 DIAGNOSIS — I5084 End stage heart failure: Secondary | ICD-10-CM | POA: Diagnosis present

## 2022-01-22 DIAGNOSIS — E861 Hypovolemia: Secondary | ICD-10-CM | POA: Diagnosis present

## 2022-01-22 DIAGNOSIS — I4821 Permanent atrial fibrillation: Secondary | ICD-10-CM | POA: Diagnosis present

## 2022-01-22 DIAGNOSIS — I482 Chronic atrial fibrillation, unspecified: Secondary | ICD-10-CM | POA: Diagnosis not present

## 2022-01-22 DIAGNOSIS — J9811 Atelectasis: Secondary | ICD-10-CM | POA: Diagnosis present

## 2022-01-22 DIAGNOSIS — A419 Sepsis, unspecified organism: Secondary | ICD-10-CM | POA: Diagnosis not present

## 2022-01-22 DIAGNOSIS — E872 Acidosis, unspecified: Secondary | ICD-10-CM | POA: Diagnosis not present

## 2022-01-22 DIAGNOSIS — J449 Chronic obstructive pulmonary disease, unspecified: Secondary | ICD-10-CM | POA: Diagnosis present

## 2022-01-22 DIAGNOSIS — J90 Pleural effusion, not elsewhere classified: Secondary | ICD-10-CM

## 2022-01-22 DIAGNOSIS — Z66 Do not resuscitate: Secondary | ICD-10-CM | POA: Diagnosis not present

## 2022-01-22 DIAGNOSIS — L8990 Pressure ulcer of unspecified site, unspecified stage: Secondary | ICD-10-CM | POA: Insufficient documentation

## 2022-01-22 DIAGNOSIS — K761 Chronic passive congestion of liver: Secondary | ICD-10-CM | POA: Diagnosis present

## 2022-01-22 DIAGNOSIS — R54 Age-related physical debility: Secondary | ICD-10-CM | POA: Diagnosis present

## 2022-01-22 DIAGNOSIS — E871 Hypo-osmolality and hyponatremia: Secondary | ICD-10-CM | POA: Diagnosis not present

## 2022-01-22 DIAGNOSIS — I517 Cardiomegaly: Secondary | ICD-10-CM | POA: Diagnosis not present

## 2022-01-22 DIAGNOSIS — N184 Chronic kidney disease, stage 4 (severe): Secondary | ICD-10-CM | POA: Diagnosis present

## 2022-01-22 DIAGNOSIS — D696 Thrombocytopenia, unspecified: Secondary | ICD-10-CM

## 2022-01-22 DIAGNOSIS — Z515 Encounter for palliative care: Secondary | ICD-10-CM

## 2022-01-22 DIAGNOSIS — Z452 Encounter for adjustment and management of vascular access device: Secondary | ICD-10-CM | POA: Diagnosis not present

## 2022-01-22 DIAGNOSIS — Z881 Allergy status to other antibiotic agents status: Secondary | ICD-10-CM

## 2022-01-22 DIAGNOSIS — E440 Moderate protein-calorie malnutrition: Secondary | ICD-10-CM | POA: Diagnosis not present

## 2022-01-22 DIAGNOSIS — E860 Dehydration: Secondary | ICD-10-CM

## 2022-01-22 DIAGNOSIS — Z7901 Long term (current) use of anticoagulants: Secondary | ICD-10-CM

## 2022-01-22 DIAGNOSIS — I4891 Unspecified atrial fibrillation: Secondary | ICD-10-CM | POA: Diagnosis not present

## 2022-01-22 DIAGNOSIS — Z79899 Other long term (current) drug therapy: Secondary | ICD-10-CM

## 2022-01-22 DIAGNOSIS — Z7951 Long term (current) use of inhaled steroids: Secondary | ICD-10-CM

## 2022-01-22 DIAGNOSIS — I340 Nonrheumatic mitral (valve) insufficiency: Secondary | ICD-10-CM | POA: Diagnosis present

## 2022-01-22 DIAGNOSIS — M79602 Pain in left arm: Secondary | ICD-10-CM | POA: Diagnosis not present

## 2022-01-22 DIAGNOSIS — I82619 Acute embolism and thrombosis of superficial veins of unspecified upper extremity: Secondary | ICD-10-CM | POA: Diagnosis not present

## 2022-01-22 DIAGNOSIS — I342 Nonrheumatic mitral (valve) stenosis: Secondary | ICD-10-CM | POA: Diagnosis not present

## 2022-01-22 DIAGNOSIS — Z800 Family history of malignant neoplasm of digestive organs: Secondary | ICD-10-CM

## 2022-01-22 DIAGNOSIS — M7989 Other specified soft tissue disorders: Secondary | ICD-10-CM | POA: Diagnosis not present

## 2022-01-22 DIAGNOSIS — N189 Chronic kidney disease, unspecified: Secondary | ICD-10-CM

## 2022-01-22 DIAGNOSIS — K219 Gastro-esophageal reflux disease without esophagitis: Secondary | ICD-10-CM | POA: Diagnosis present

## 2022-01-22 DIAGNOSIS — I5043 Acute on chronic combined systolic (congestive) and diastolic (congestive) heart failure: Secondary | ICD-10-CM | POA: Diagnosis not present

## 2022-01-22 DIAGNOSIS — I5082 Biventricular heart failure: Secondary | ICD-10-CM

## 2022-01-22 DIAGNOSIS — K7200 Acute and subacute hepatic failure without coma: Secondary | ICD-10-CM | POA: Diagnosis not present

## 2022-01-22 DIAGNOSIS — G4733 Obstructive sleep apnea (adult) (pediatric): Secondary | ICD-10-CM | POA: Diagnosis present

## 2022-01-22 DIAGNOSIS — Z6822 Body mass index (BMI) 22.0-22.9, adult: Secondary | ICD-10-CM

## 2022-01-22 DIAGNOSIS — I5023 Acute on chronic systolic (congestive) heart failure: Secondary | ICD-10-CM | POA: Diagnosis present

## 2022-01-22 DIAGNOSIS — Z801 Family history of malignant neoplasm of trachea, bronchus and lung: Secondary | ICD-10-CM

## 2022-01-22 DIAGNOSIS — R531 Weakness: Secondary | ICD-10-CM | POA: Diagnosis not present

## 2022-01-22 LAB — URINALYSIS, ROUTINE W REFLEX MICROSCOPIC
Bilirubin Urine: NEGATIVE
Glucose, UA: NEGATIVE mg/dL
Ketones, ur: NEGATIVE mg/dL
Leukocytes,Ua: NEGATIVE
Nitrite: NEGATIVE
Protein, ur: 30 mg/dL — AB
Specific Gravity, Urine: 1.014 (ref 1.005–1.030)
pH: 5 (ref 5.0–8.0)

## 2022-01-22 LAB — CBC
HCT: 42.7 % (ref 36.0–46.0)
Hemoglobin: 14 g/dL (ref 12.0–15.0)
MCH: 28.3 pg (ref 26.0–34.0)
MCHC: 32.8 g/dL (ref 30.0–36.0)
MCV: 86.4 fL (ref 80.0–100.0)
Platelets: 135 10*3/uL — ABNORMAL LOW (ref 150–400)
RBC: 4.94 MIL/uL (ref 3.87–5.11)
RDW: 17.7 % — ABNORMAL HIGH (ref 11.5–15.5)
WBC: 8.7 10*3/uL (ref 4.0–10.5)
nRBC: 0.9 % — ABNORMAL HIGH (ref 0.0–0.2)

## 2022-01-22 LAB — COMPREHENSIVE METABOLIC PANEL
ALT: 75 U/L — ABNORMAL HIGH (ref 0–44)
AST: 70 U/L — ABNORMAL HIGH (ref 15–41)
Albumin: 3.7 g/dL (ref 3.5–5.0)
Alkaline Phosphatase: 216 U/L — ABNORMAL HIGH (ref 38–126)
Anion gap: 16 — ABNORMAL HIGH (ref 5–15)
BUN: 90 mg/dL — ABNORMAL HIGH (ref 8–23)
CO2: 18 mmol/L — ABNORMAL LOW (ref 22–32)
Calcium: 9.4 mg/dL (ref 8.9–10.3)
Chloride: 99 mmol/L (ref 98–111)
Creatinine, Ser: 2.83 mg/dL — ABNORMAL HIGH (ref 0.44–1.00)
GFR, Estimated: 16 mL/min — ABNORMAL LOW (ref >60)
Glucose, Bld: 102 mg/dL — ABNORMAL HIGH (ref 70–99)
Potassium: 6.7 mmol/L (ref 3.5–5.1)
Sodium: 133 mmol/L — ABNORMAL LOW (ref 135–145)
Total Bilirubin: 2.7 mg/dL — ABNORMAL HIGH (ref 0.3–1.2)
Total Protein: 7.7 g/dL (ref 6.5–8.1)

## 2022-01-22 LAB — CBC WITH DIFFERENTIAL/PLATELET
Abs Immature Granulocytes: 0.05 10*3/uL (ref 0.00–0.07)
Basophils Absolute: 0 10*3/uL (ref 0.0–0.1)
Basophils Relative: 0 %
Eosinophils Absolute: 0 10*3/uL (ref 0.0–0.5)
Eosinophils Relative: 0 %
HCT: 41.7 % (ref 36.0–46.0)
Hemoglobin: 13.4 g/dL (ref 12.0–15.0)
Immature Granulocytes: 1 %
Lymphocytes Relative: 6 %
Lymphs Abs: 0.5 10*3/uL — ABNORMAL LOW (ref 0.7–4.0)
MCH: 28.6 pg (ref 26.0–34.0)
MCHC: 32.1 g/dL (ref 30.0–36.0)
MCV: 88.9 fL (ref 80.0–100.0)
Monocytes Absolute: 0.4 10*3/uL (ref 0.1–1.0)
Monocytes Relative: 5 %
Neutro Abs: 7 10*3/uL (ref 1.7–7.7)
Neutrophils Relative %: 88 %
Platelets: 127 10*3/uL — ABNORMAL LOW (ref 150–400)
RBC: 4.69 MIL/uL (ref 3.87–5.11)
RDW: 17.4 % — ABNORMAL HIGH (ref 11.5–15.5)
WBC: 7.9 10*3/uL (ref 4.0–10.5)
nRBC: 1.1 % — ABNORMAL HIGH (ref 0.0–0.2)

## 2022-01-22 LAB — POTASSIUM
Potassium: 6.7 mmol/L (ref 3.5–5.1)
Potassium: 6.6 mmol/L (ref 3.5–5.1)

## 2022-01-22 LAB — TROPONIN I (HIGH SENSITIVITY)
Troponin I (High Sensitivity): 77 ng/L — ABNORMAL HIGH (ref <18)
Troponin I (High Sensitivity): 69 ng/L — ABNORMAL HIGH (ref <18)

## 2022-01-22 LAB — LACTIC ACID, PLASMA
Lactic Acid, Venous: 2.2 mmol/L (ref 0.5–1.9)
Lactic Acid, Venous: 2.5 mmol/L (ref 0.5–1.9)
Lactic Acid, Venous: 3.3 mmol/L (ref 0.5–1.9)

## 2022-01-22 LAB — DIGOXIN LEVEL: Digoxin Level: <0.2 ng/mL — ABNORMAL LOW (ref 0.8–2.0)

## 2022-01-22 LAB — LIPASE, BLOOD: Lipase: 33 U/L (ref 11–51)

## 2022-01-22 LAB — APTT: aPTT: 29 seconds (ref 24–36)

## 2022-01-22 LAB — RESP PANEL BY RT-PCR (FLU A&B, COVID) ARPGX2
Influenza A by PCR: NEGATIVE
Influenza B by PCR: NEGATIVE
SARS Coronavirus 2 by RT PCR: NEGATIVE

## 2022-01-22 LAB — PROTIME-INR
INR: 1.6 — ABNORMAL HIGH (ref 0.8–1.2)
Prothrombin Time: 18.9 seconds — ABNORMAL HIGH (ref 11.4–15.2)

## 2022-01-22 LAB — CBG MONITORING, ED: Glucose-Capillary: 137 mg/dL — ABNORMAL HIGH (ref 70–99)

## 2022-01-22 MED ORDER — LACTATED RINGERS IV SOLN: INTRAVENOUS | Status: DC

## 2022-01-22 MED ORDER — SODIUM BICARBONATE 8.4 % IV SOLN
INTRAVENOUS | Status: AC
  Filled 2022-01-22: qty 50

## 2022-01-22 MED ORDER — SODIUM ZIRCONIUM CYCLOSILICATE 5 G PO PACK
10.0000 g | PACK | ORAL | Status: AC
  Administered 2022-01-22: 10 g via ORAL
  Filled 2022-01-22: qty 2

## 2022-01-22 MED ORDER — SODIUM CHLORIDE 0.9 % IV SOLN
2.0000 g | INTRAVENOUS | Status: DC
  Administered 2022-01-22: 2 g via INTRAVENOUS
  Filled 2022-01-22: qty 20

## 2022-01-22 MED ORDER — CALCIUM GLUCONATE-NACL 1-0.675 GM/50ML-% IV SOLN
1.0000 g | Freq: Once | INTRAVENOUS | Status: AC
Start: 2022-01-22 — End: 2022-01-22
  Administered 2022-01-22: 1000 mg via INTRAVENOUS
  Filled 2022-01-22: qty 50

## 2022-01-22 MED ORDER — SODIUM CHLORIDE 0.9 % IV BOLUS (SEPSIS)
1000.0000 mL | Freq: Once | INTRAVENOUS | Status: AC
  Administered 2022-01-22: 1000 mL via INTRAVENOUS

## 2022-01-22 MED ORDER — AMIODARONE HCL IN DEXTROSE 360-4.14 MG/200ML-% IV SOLN
60.0000 mg/h | INTRAVENOUS | Status: DC
  Administered 2022-01-23 – 2022-01-24 (×3): 30 mg/h via INTRAVENOUS
  Administered 2022-01-24 – 2022-01-29 (×14): 60 mg/h via INTRAVENOUS
  Filled 2022-01-22 (×7): qty 200
  Filled 2022-01-22: qty 400
  Filled 2022-01-22 (×13): qty 200

## 2022-01-22 MED ORDER — CHLORHEXIDINE GLUCONATE CLOTH 2 % EX PADS
6.0000 | MEDICATED_PAD | Freq: Every day | CUTANEOUS | Status: DC
  Administered 2022-01-23 – 2022-01-28 (×6): 6 via TOPICAL

## 2022-01-22 MED ORDER — SODIUM BICARBONATE 8.4 % IV SOLN
50.0000 meq | Freq: Once | INTRAVENOUS | Status: AC
  Administered 2022-01-22: 50 meq via INTRAVENOUS

## 2022-01-22 MED ORDER — INSULIN ASPART 100 UNIT/ML IV SOLN
5.0000 [IU] | Freq: Once | INTRAVENOUS | Status: AC
  Administered 2022-01-22: 5 [IU] via INTRAVENOUS

## 2022-01-22 MED ORDER — AMIODARONE HCL IN DEXTROSE 360-4.14 MG/200ML-% IV SOLN
60.0000 mg/h | INTRAVENOUS | Status: AC
Start: 2022-01-22 — End: 2022-01-23
  Administered 2022-01-22: 60 mg/h via INTRAVENOUS
  Filled 2022-01-22: qty 200

## 2022-01-22 MED ORDER — ALBUTEROL SULFATE (2.5 MG/3ML) 0.083% IN NEBU
10.0000 mg | INHALATION_SOLUTION | Freq: Once | RESPIRATORY_TRACT | Status: AC
  Administered 2022-01-22: 10 mg via RESPIRATORY_TRACT
  Filled 2022-01-22: qty 12

## 2022-01-22 MED ORDER — DEXTROSE 10 % IV SOLN: Freq: Once | INTRAVENOUS | Status: AC

## 2022-01-22 MED ORDER — ONDANSETRON 4 MG PO TBDP
4.0000 mg | ORAL_TABLET | Freq: Once | ORAL | Status: DC | PRN
Start: 2022-01-22 — End: 2022-01-29

## 2022-01-22 MED ORDER — DEXTROSE 50 % IV SOLN
1.0000 | Freq: Once | INTRAVENOUS | Status: AC
  Administered 2022-01-22: 50 mL via INTRAVENOUS
  Filled 2022-01-22: qty 50

## 2022-01-22 NOTE — ED Triage Notes (Signed)
Pt c/o increasing weakness, polyuria, and nausea x2 days.  Denies pain.  Denies v/d.  Pt reports she has not been eating or drinking well.   ?

## 2022-01-22 NOTE — ED Provider Notes (Signed)
?[facility] ?Provider Note ? ? ?CSN: [id number] ?Arrival date & time: 01/28/2022  1641 ? ?  ? ?History ? ?Chief Complaint  ?Patient presents with  ? Weakness  ? Nausea  ? ? ?[name] is a 88 y.o. female. ? ? ?Weakness ? ?This patient is a very ill-appearing 88 year old female with a known history of atrial fibrillation, she is on warfarin, diltiazem and digoxin, her last echocardiogram from December 2021 showed an ejection fraction of 55 to 60% no other significant abnormalities other than mild mitral regurgitation and moderate mitral stenosis.  She was seen in the emergency department here about a month and a half ago with atrial fibrillation from her PCPs office, she was restarted on Lasix and did very well.  Today she comes back from home because of generalized weakness for 2 days. ? ?Describes weakness as generalized, nonfocal, not associated with fevers or chills, not associated with shortness of breath or significant cough, she does not hurt in her chest, she has had some urinary frequency and has been up a couple of times overnight urinating despite having decreased oral intake over the last few days.  She also notes some swelling in her right lower extremity but states she is good about taking her warfarin. ? ?Home Medications ?Prior to Admission medications   ?Medication Sig Start Date End Date Taking? Authorizing Provider  ?acetaminophen (TYLENOL) 325 MG tablet Take 2 tablets (650 mg total) by mouth every 6 (six) hours as needed for mild pain, fever or headache. 05/03/19   Meuth, Jerene Pitch A, PA-C  ?albuterol (PROVENTIL) (2.5 MG/3ML) 0.083% nebulizer solution USE 1 VIAL IN NEBULIZER EVERY 6 HOURS AS NEEDED FOR WHEEZING OR SHORTNESS OF BREATH 04/10/21   [name] D, MD  ?albuterol (VENTOLIN HFA) 108 (90 Base) MCG/ACT inhaler TAKE 2 PUFFS BY MOUTH EVERY 6 HOURS AS NEEDED FOR WHEEZE OR SHORTNESS OF BREATH 09/05/21   [name] D, MD  ?Budeson-Glycopyrrol-Formoterol (BREZTRI  AEROSPHERE) 160-9-4.8 MCG/ACT AERO Inhale 2 puffs into the lungs in the morning and at bedtime. 05/16/20   [name] D, MD  ?digoxin (LANOXIN) 0.125 MG tablet TAKE 1 TABLET EVERY DAY 05/08/21   Satira Sark, MD  ?diltiazem (CARDIZEM CD) 180 MG 24 hr capsule Take 1 capsule (180 mg total) by mouth daily. 04/30/21 07/29/21  Satira Sark, MD  ?docusate sodium (COLACE) 100 MG capsule Take 100 mg by mouth 2 (two) times daily.    [provider]  ?escitalopram (LEXAPRO) 10 MG tablet Take 1 tablet (10 mg total) by mouth every morning. 05/20/19   Gerlene Fee, NP  ?esomeprazole (NEXIUM) 40 MG capsule Take 1 capsule (40 mg total) by mouth 2 (two) times daily. 05/20/19   Gerlene Fee, NP  ?furosemide (LASIX) 40 MG tablet TAKE 1 TABLET BY MOUTH EVERY DAY ?Patient taking differently: 80 mg. 11/15/20   Satira Sark, MD  ?furosemide (LASIX) 40 MG tablet Take 1 tablet (40 mg total) by mouth daily. 12/12/21   Sherwood Gambler, MD  ?ipratropium (ATROVENT) 0.03 % nasal spray Place 2 sprays into both nostrils every 12 (twelve) hours. 03/07/21   Freddi Starr, MD  ?NON FORMULARY Diet- Regular    [provider]  ?phenylephrine-shark liver oil-mineral oil-petrolatum (PREPARATION H) 0.25-3-14-71.9 % rectal ointment Place 1 application rectally 2 (two) times daily as needed for hemorrhoids.    [provider]  ?polyethylene glycol (MIRALAX / GLYCOLAX) 17 g packet Take 17 g by mouth daily. 05/03/19  Meuth, Brooke A, PA-C  ?potassium chloride SA (K-DUR) 20 MEQ tablet Take 1 tablet (20 mEq total) by mouth daily. 05/20/19   Gerlene Fee, NP  ?predniSONE (DELTASONE) 10 MG tablet Take 4 tabs for 2 days, then 3 tabs for 2 days, 2 tabs for 2 days, then 1 tab for 2 days, then stop. 03/07/21   Freddi Starr, MD  ?warfarin (COUMADIN) 5 MG tablet TAKE 1 TABLET BY MOUTH EVERY DAY OR AS DIRECTED 10/31/20   Satira Sark, MD  ?   ? ?Allergies    ?Amoxicillin-pot clavulanate, Clarithromycin,  Clindamycin, Doxycycline, and Penicillins   ? ?Review of Systems   ?Review of Systems  ?Neurological:  Positive for weakness.  ?All other systems reviewed and are negative. ? ?Physical Exam ?Updated Vital Signs ?BP 94/79   Pulse (!) 155   Temp (!) 97.5 ?F (36.4 ?C) (Oral)   Resp (!) 26   Ht 1.676 m ('5\' 6"'$ )   Wt 63.5 kg   SpO2 98%   BMI 22.60 kg/m?  ?Physical Exam ?Vitals and nursing note reviewed.  ?Constitutional:   ?   General: She is not in acute distress. ?   Appearance: She is well-developed.  ?HENT:  ?   Head: Normocephalic and atraumatic.  ?   Mouth/Throat:  ?   Pharynx: No oropharyngeal exudate.  ?Eyes:  ?   General: No scleral icterus.    ?   Right eye: No discharge.     ?   Left eye: No discharge.  ?   Conjunctiva/sclera: Conjunctivae normal.  ?   Pupils: Pupils are equal, round, and reactive to light.  ?Neck:  ?   Thyroid: No thyromegaly.  ?   Vascular: No JVD.  ?Cardiovascular:  ?   Rate and Rhythm: Tachycardia present. Rhythm irregular.  ?   Heart sounds: Normal heart sounds. No murmur heard. ?  No friction rub. No gallop.  ?Pulmonary:  ?   Effort: Pulmonary effort is normal. No respiratory distress.  ?   Breath sounds: Rales present. No wheezing.  ?   Comments: Rales right greater than left ?Abdominal:  ?   General: Bowel sounds are normal. There is no distension.  ?   Palpations: Abdomen is soft. There is no mass.  ?   Tenderness: There is no abdominal tenderness.  ?Musculoskeletal:     ?   General: No tenderness. Normal range of motion.  ?   Cervical back: Normal range of motion and neck supple.  ?   Right lower leg: Edema present.  ?   Left lower leg: No edema.  ?Lymphadenopathy:  ?   Cervical: No cervical adenopathy.  ?Skin: ?   General: Skin is warm and dry.  ?   Findings: No erythema or rash.  ?Neurological:  ?   Mental Status: She is alert.  ?   Coordination: Coordination normal.  ?Psychiatric:     ?   Behavior: Behavior normal.  ? ? ?ED Results / Procedures / Treatments   ?Labs ?(all  labs ordered are listed, but only abnormal results are displayed) ?Labs Reviewed  ?CBC - Abnormal; Notable for the following components:  ?    Result Value  ? RDW 17.7 (*)   ? Platelets 135 (*)   ? nRBC 0.9 (*)   ? All other components within normal limits  ?URINALYSIS, ROUTINE W REFLEX MICROSCOPIC - Abnormal; Notable for the following components:  ? Color, Urine AMBER (*)   ? APPearance HAZY (*)   ?  Hgb urine dipstick SMALL (*)   ? Protein, ur 30 (*)   ? Bacteria, UA RARE (*)   ? All other components within normal limits  ?COMPREHENSIVE METABOLIC PANEL - Abnormal; Notable for the following components:  ? Sodium 133 (*)   ? Potassium 6.7 (*)   ? CO2 18 (*)   ? Glucose, Bld 102 (*)   ? BUN 90 (*)   ? Creatinine, Ser 2.83 (*)   ? AST 70 (*)   ? ALT 75 (*)   ? Alkaline Phosphatase 216 (*)   ? Total Bilirubin 2.7 (*)   ? GFR, Estimated 16 (*)   ? Anion gap 16 (*)   ? All other components within normal limits  ?LACTIC ACID, PLASMA - Abnormal; Notable for the following components:  ? Lactic Acid, Venous 2.2 (*)   ? All other components within normal limits  ?LACTIC ACID, PLASMA - Abnormal; Notable for the following components:  ? Lactic Acid, Venous 2.5 (*)   ? All other components within normal limits  ?CBC WITH DIFFERENTIAL/PLATELET - Abnormal; Notable for the following components:  ? RDW 17.4 (*)   ? Platelets 127 (*)   ? nRBC 1.1 (*)   ? Lymphs Abs 0.5 (*)   ? All other components within normal limits  ?PROTIME-INR - Abnormal; Notable for the following components:  ? Prothrombin Time 18.9 (*)   ? INR 1.6 (*)   ? All other components within normal limits  ?DIGOXIN LEVEL - Abnormal; Notable for the following components:  ? Digoxin Level <0.2 (*)   ? All other components within normal limits  ?POTASSIUM - Abnormal; Notable for the following components:  ? Potassium 6.7 (*)   ? All other components within normal limits  ?POTASSIUM - Abnormal; Notable for the following components:  ? Potassium 6.6 (*)   ? All other  components within normal limits  ?CBG MONITORING, ED - Abnormal; Notable for the following components:  ? Glucose-Capillary 137 (*)   ? All other components within normal limits  ?TROPONIN I (HIGH SENSITIVITY) - Abnormal; Not

## 2022-01-22 NOTE — Sepsis Progress Note (Signed)
eLink is monitoring this Code Sepsis. °

## 2022-01-22 NOTE — H&P (Signed)
History and Physical    Patient: [name] GEZ:[id number] DOB: 09/06/1934 DOA: 01/15/2022 DOS: the patient was seen and examined on 01/23/2022 PCP: Redmond School, MD  Patient coming from: Home  Chief Complaint:  Chief Complaint  Patient presents with   Weakness   Nausea   HPI: [name] is a 88 y.o. female with medical history significant of chronic atrial fibrillation, COPD, GERD who presents to the emergency department due to 2-3 day onset of generalized weakness.  Patient states that she has not been eating or drinking within the last 2 to 3 days, she endorsed weakness and that all she wanted to do was to sleep.  She endorsed occasional shortness of breath, but denies fever, chills, excessive urination, burning sensation with urination or any other irritative bladder symptoms, chest pain, headache or having any sick contact.  Patient states that she lives with niece.  ED Course:  In the emergency department, she was tachypneic and intermittently tachycardic.  BP was 91/75.  Temperature 97.5, O2 sat 96% on room air.  Work-up in the ED showed normal CBC except for thrombocytopenia.  BMP showed hyperkalemia, metabolic acidosis with bicarb at 18, BUN/creatinine 19/2.83 (baseline creatinine at 1.4), transaminitis, elevated total bilirubin, lactic acid 2.2 > 2.5, troponin x2 -77 > 69.  Urinalysis was unimpressive for UTI, lipase 33, INR 1.6, digoxin level <0.2.  Blood culture pending. Chest x-ray showed moderate-sized right and small left pleural effusions with overlying atelectasis. Patient was treated with calcium gluconate to protect the heart.  Albuterol was given, patient was also treated insulin and D50, sodium bicarbonate and IV hydration was provided.  Hospitalist was asked to admit patient for further evaluation and management.  Review of Systems: Review of systems as noted in the HPI. All other systems reviewed and are negative.   Past Medical History:  Diagnosis Date    Asthma    Atrial fibrillation (HCC)    Chronic rhinitis    COPD (chronic obstructive pulmonary disease) (HCC)    GERD (gastroesophageal reflux disease)    Mitral regurgitation    Mitral valve prolapse    Nasal polyps    Nonischemic cardiomyopathy (HCC)    LVEF 25-30% improved to 45-50%   Obstructive sleep apnea    Transudative pleural effusion    Past Surgical History:  Procedure Laterality Date   APPENDECTOMY  2000   Bilateral cataract surgery     LAPAROSCOPIC NISSEN FUNDOPLICATION  6503   NOSE SURGERY      Social History:  reports that she quit smoking about 41 years ago. Her smoking use included cigarettes. She has a 10.00 pack-year smoking history. She has never used smokeless tobacco. She reports that she does not drink alcohol and does not use drugs.   Allergies  Allergen Reactions   Amoxicillin-Pot Clavulanate Itching and Swelling   Clarithromycin Itching and Swelling   Clindamycin Itching and Swelling   Doxycycline Itching and Swelling   Penicillins Itching and Swelling    Did it involve swelling of the face/tongue/throat, SOB, or low BP? Yes Did it involve sudden or severe rash/hives, skin peeling, or any reaction on the inside of your mouth or nose? No Did you need to seek medical attention at a hospital or doctor's office? Yes When did it last happen? Over 10 years     If all above answers are "NO", may proceed with cephalosporin use.     Family History  Problem Relation Age of Onset   Colon cancer Mother  Colon cancer Father    Atrial fibrillation Sister    Lung cancer Brother    Lung cancer Brother      Prior to Admission medications   Medication Sig Start Date End Date Taking? Authorizing Provider  acetaminophen (TYLENOL) 325 MG tablet Take 2 tablets (650 mg total) by mouth every 6 (six) hours as needed for mild pain, fever or headache. 05/03/19   Meuth, Brooke A, PA-C  albuterol (PROVENTIL) (2.5 MG/3ML) 0.083% nebulizer solution USE 1 VIAL IN  NEBULIZER EVERY 6 HOURS AS NEEDED FOR WHEEZING OR SHORTNESS OF BREATH 04/10/21   [name] D, MD  albuterol (VENTOLIN HFA) 108 (90 Base) MCG/ACT inhaler TAKE 2 PUFFS BY MOUTH EVERY 6 HOURS AS NEEDED FOR WHEEZE OR SHORTNESS OF BREATH 09/05/21   Young, Tarri Fuller D, MD  Budeson-Glycopyrrol-Formoterol (BREZTRI AEROSPHERE) 160-9-4.8 MCG/ACT AERO Inhale 2 puffs into the lungs in the morning and at bedtime. 05/16/20   Deneise Lever, MD  digoxin (LANOXIN) 0.125 MG tablet TAKE 1 TABLET EVERY DAY 05/08/21   Satira Sark, MD  diltiazem (CARDIZEM CD) 180 MG 24 hr capsule Take 1 capsule (180 mg total) by mouth daily. 04/30/21 07/29/21  Satira Sark, MD  docusate sodium (COLACE) 100 MG capsule Take 100 mg by mouth 2 (two) times daily.    [provider]  escitalopram (LEXAPRO) 10 MG tablet Take 1 tablet (10 mg total) by mouth every morning. 05/20/19   Gerlene Fee, NP  esomeprazole (NEXIUM) 40 MG capsule Take 1 capsule (40 mg total) by mouth 2 (two) times daily. 05/20/19   Gerlene Fee, NP  furosemide (LASIX) 40 MG tablet TAKE 1 TABLET BY MOUTH EVERY DAY Patient taking differently: 80 mg. 11/15/20   Satira Sark, MD  furosemide (LASIX) 40 MG tablet Take 1 tablet (40 mg total) by mouth daily. 12/12/21   Sherwood Gambler, MD  ipratropium (ATROVENT) 0.03 % nasal spray Place 2 sprays into both nostrils every 12 (twelve) hours. 03/07/21   Freddi Starr, MD  NON FORMULARY Diet- Regular    [provider]  phenylephrine-shark liver oil-mineral oil-petrolatum (PREPARATION H) 0.25-3-14-71.9 % rectal ointment Place 1 application rectally 2 (two) times daily as needed for hemorrhoids.    [provider]  polyethylene glycol (MIRALAX / GLYCOLAX) 17 g packet Take 17 g by mouth daily. 05/03/19   Meuth, Brooke A, PA-C  potassium chloride SA (K-DUR) 20 MEQ tablet Take 1 tablet (20 mEq total) by mouth daily. 05/20/19   Gerlene Fee, NP  predniSONE (DELTASONE) 10 MG tablet Take 4  tabs for 2 days, then 3 tabs for 2 days, 2 tabs for 2 days, then 1 tab for 2 days, then stop. 03/07/21   Freddi Starr, MD  warfarin (COUMADIN) 5 MG tablet TAKE 1 TABLET BY MOUTH EVERY DAY OR AS DIRECTED 10/31/20   Satira Sark, MD    Physical Exam: BP 108/71   Pulse (!) 147   Temp (!) 97.5 F (36.4 C) (Oral)   Resp (!) 33   Ht '5\' 6"'$  (1.676 m)   Wt 62.4 kg   SpO2 96%   BMI 22.20 kg/m   General: 88 y.o. year-old female ill appearing, but in no acute distress.  Alert and oriented x3. HEENT: NCAT, EOMI, dry mucous membrane. Neck: Supple, trachea medial Cardiovascular: Tachycardia, irregular rate and rhythm with no rubs or gallops.  No thyromegaly or JVD noted.  No lower extremity edema. 2/4 pulses in all 4 extremities. Respiratory:  Decreased breath sounds bilaterally in lower lobes with bilateral rales (R > L )  Abdomen: Soft, nontender nondistended with normal bowel sounds x4 quadrants. Muskuloskeletal: No cyanosis, clubbing or edema noted bilaterally Neuro: CN II-XII intact, strength 5/5 x 4, sensation, reflexes intact Skin: No ulcerative lesions noted or rashes Psychiatry: Mood is appropriate for condition and setting          Labs on Admission:  Basic Metabolic Panel: Recent Labs  Lab 01/15/2022 1719 01/27/2022 1744 02/02/2022 1938  NA 133*  --   --   K 6.7* 6.7* 6.6*  CL 99  --   --   CO2 18*  --   --   GLUCOSE 102*  --   --   BUN 90*  --   --   CREATININE 2.83*  --   --   CALCIUM 9.4  --   --    Liver Function Tests: Recent Labs  Lab 01/13/2022 1719  AST 70*  ALT 75*  ALKPHOS 216*  BILITOT 2.7*  PROT 7.7  ALBUMIN 3.7   Recent Labs  Lab 01/31/2022 1719  LIPASE 33   No results for input(s): AMMONIA in the last 168 hours. CBC: Recent Labs  Lab 01/15/2022 1719 01/25/2022 1744  WBC 8.7 7.9  NEUTROABS  --  7.0  HGB 14.0 13.4  HCT 42.7 41.7  MCV 86.4 88.9  PLT 135* 127*   Cardiac Enzymes: No results for input(s): CKTOTAL, CKMB, CKMBINDEX, TROPONINI in  the last 168 hours.  BNP (last 3 results) Recent Labs    12/12/21 0837  BNP 573.0*    ProBNP (last 3 results) No results for input(s): PROBNP in the last 8760 hours.  CBG: Recent Labs  Lab 02/03/2022 2004  GLUCAP 137*    Radiological Exams on Admission: DG Chest Port 1 View  Result Date: 01/14/2022 CLINICAL DATA:  Sepsis and weakness. EXAM: PORTABLE CHEST 1 VIEW COMPARISON:  Chest x-ray 12/12/2021 FINDINGS: The cardiac silhouette, mediastinal and hilar contours are stable. Moderate-sized right and small left pleural effusion with overlying atelectasis. No pulmonary edema or pulmonary lesions. IMPRESSION: Moderate-sized right and small left pleural effusions with overlying atelectasis. Electronically Signed   By: Marijo Sanes M.D.   On: 01/23/2022 17:41    EKG: I independently viewed the EKG done and my findings are as followed: A-fib with RVR with QTc 517 ms  Assessment/Plan Present on Admission:  Hyperkalemia  Atrial fibrillation ([place])  Principal Problem:   Hyperkalemia Active Problems:   Atrial fibrillation (HCC)   Acute kidney injury superimposed on CKD (HCC)   Dehydration   Thrombocytopenia (HCC)   Bilateral pleural effusion   Prolonged QT interval   Lactic acidosis  Hyperkalemia K+ 6.7 Patient will be admitted to stepdown unit at this time Continue telemetry and continue to monitor potassium levels Calcium gluconate was given Patient was treated with albuterol, sodium bicarbonate, insulin and D50 Lokelma was given  AKI on CKD 4 Dehydration BUN/creatinine 19/2.83 (baseline creatinine at 1.4), Continue gentle hydration Renally adjust medications, avoid nephrotoxic agents/dehydration/hypotension  Lactic acidosis Lactic acid 2.2 > 2.5 This may be due to multifactorial including dehydration, acute kidney injury Continue IV hydration Continue to monitor lactic acid  Chronic thrombocytopenia Platelets 127, continue to monitor platelet levels with morning  labs  Transaminitis possibly due to shock liver/dehydration AST 70, ALT 75, ALP 216 Patient denies abdominal pain.   Bilateral pleural effusion Patient presents with moderate-sized right and small left pleural effusions with overlying atelectasis Personally reviewed, right  pleural effusion appears to be slightly worse than chest x-ray done on December 12, 2021 Consider Lasix when BP improves Consider thoracentesis for worsening of symptoms Continue incentive spirometry and flutter valve  Chronic A-fib with RVR Continue amiodarone drip with plan to transition to home meds once the rate is better controlled  Prolonged QTc (517 ms) Avoid QT prolonging drugs Magnesium level will be checked Repeat EKG in the morning  Elevated troponin possible secondary to type II demand ischemia Troponin x2 -77 > 69; troponin already flattened, patient denies any chest pain  Obstructive sleep apnea (not on CPAP Stable    DVT prophylaxis: Warfarin   Code Status: Full code   Consults: None  Family Communication: None at bedside  Severity of Illness: The appropriate patient status for this patient is INPATIENT. Inpatient status is judged to be reasonable and necessary in order to provide the required intensity of service to ensure the patient's safety. The patient's presenting symptoms, physical exam findings, and initial radiographic and laboratory data in the context of their chronic comorbidities is felt to place them at high risk for further clinical deterioration. Furthermore, it is not anticipated that the patient will be medically stable for discharge from the hospital within 2 midnights of admission.   * I certify that at the point of admission it is my clinical judgment that the patient will require inpatient hospital care spanning beyond 2 midnights from the point of admission due to high intensity of service, high risk for further deterioration and high frequency of surveillance  required.*  Author: Bernadette Hoit, DO 01/23/2022 12:20 AM  For on call review [email or web address].

## 2022-01-23 ENCOUNTER — Inpatient Hospital Stay (HOSPITAL_COMMUNITY): Payer: Medicare PPO

## 2022-01-23 DIAGNOSIS — E860 Dehydration: Secondary | ICD-10-CM

## 2022-01-23 DIAGNOSIS — R9431 Abnormal electrocardiogram [ECG] [EKG]: Secondary | ICD-10-CM

## 2022-01-23 DIAGNOSIS — J90 Pleural effusion, not elsewhere classified: Secondary | ICD-10-CM

## 2022-01-23 DIAGNOSIS — E440 Moderate protein-calorie malnutrition: Secondary | ICD-10-CM | POA: Insufficient documentation

## 2022-01-23 DIAGNOSIS — I4891 Unspecified atrial fibrillation: Secondary | ICD-10-CM

## 2022-01-23 DIAGNOSIS — E875 Hyperkalemia: Secondary | ICD-10-CM | POA: Diagnosis not present

## 2022-01-23 DIAGNOSIS — I482 Chronic atrial fibrillation, unspecified: Secondary | ICD-10-CM | POA: Diagnosis not present

## 2022-01-23 DIAGNOSIS — E872 Acidosis, unspecified: Secondary | ICD-10-CM

## 2022-01-23 DIAGNOSIS — N179 Acute kidney failure, unspecified: Secondary | ICD-10-CM

## 2022-01-23 DIAGNOSIS — D696 Thrombocytopenia, unspecified: Secondary | ICD-10-CM

## 2022-01-23 DIAGNOSIS — N189 Chronic kidney disease, unspecified: Secondary | ICD-10-CM

## 2022-01-23 LAB — ECHOCARDIOGRAM COMPLETE
AR max vel: 1.69 cm2
AV Area VTI: 1.89 cm2
AV Area mean vel: 1.54 cm2
AV Mean grad: 3.3 mmHg
AV Peak grad: 5.8 mmHg
Ao pk vel: 1.21 m/s
Area-P 1/2: 3.72 cm2
Height: 66 in
MV VTI: 0.85 cm2
S' Lateral: 3.7 cm
Weight: 2201.07 oz

## 2022-01-23 LAB — PHOSPHORUS: Phosphorus: 3.6 mg/dL (ref 2.5–4.6)

## 2022-01-23 LAB — URINE CULTURE: Culture: NO GROWTH

## 2022-01-23 LAB — BASIC METABOLIC PANEL
Anion gap: 13 (ref 5–15)
BUN: 84 mg/dL — ABNORMAL HIGH (ref 8–23)
CO2: 17 mmol/L — ABNORMAL LOW (ref 22–32)
Calcium: 8.5 mg/dL — ABNORMAL LOW (ref 8.9–10.3)
Chloride: 105 mmol/L (ref 98–111)
Creatinine, Ser: 2.66 mg/dL — ABNORMAL HIGH (ref 0.44–1.00)
GFR, Estimated: 17 mL/min — ABNORMAL LOW (ref >60)
Glucose, Bld: 120 mg/dL — ABNORMAL HIGH (ref 70–99)
Potassium: 5.7 mmol/L — ABNORMAL HIGH (ref 3.5–5.1)
Sodium: 135 mmol/L (ref 135–145)

## 2022-01-23 LAB — GLUCOSE, CAPILLARY
Glucose-Capillary: 113 mg/dL — ABNORMAL HIGH (ref 70–99)
Glucose-Capillary: 112 mg/dL — ABNORMAL HIGH (ref 70–99)
Glucose-Capillary: 138 mg/dL — ABNORMAL HIGH (ref 70–99)
Glucose-Capillary: 153 mg/dL — ABNORMAL HIGH (ref 70–99)

## 2022-01-23 LAB — COMPREHENSIVE METABOLIC PANEL
ALT: 51 U/L — ABNORMAL HIGH (ref 0–44)
AST: 52 U/L — ABNORMAL HIGH (ref 15–41)
Albumin: 1.9 g/dL — ABNORMAL LOW (ref 3.5–5.0)
Alkaline Phosphatase: 149 U/L — ABNORMAL HIGH (ref 38–126)
Anion gap: 15 (ref 5–15)
BUN: 60 mg/dL — ABNORMAL HIGH (ref 8–23)
CO2: 12 mmol/L — ABNORMAL LOW (ref 22–32)
Calcium: 7.6 mg/dL — ABNORMAL LOW (ref 8.9–10.3)
Chloride: 107 mmol/L (ref 98–111)
Creatinine, Ser: 1.61 mg/dL — ABNORMAL HIGH (ref 0.44–1.00)
GFR, Estimated: 31 mL/min — ABNORMAL LOW (ref >60)
Glucose, Bld: 82 mg/dL (ref 70–99)
Potassium: 5.4 mmol/L — ABNORMAL HIGH (ref 3.5–5.1)
Sodium: 134 mmol/L — ABNORMAL LOW (ref 135–145)
Total Bilirubin: 1.4 mg/dL — ABNORMAL HIGH (ref 0.3–1.2)
Total Protein: 4.1 g/dL — ABNORMAL LOW (ref 6.5–8.1)

## 2022-01-23 LAB — LACTIC ACID, PLASMA
Lactic Acid, Venous: 2.7 mmol/L (ref 0.5–1.9)
Lactic Acid, Venous: >9 mmol/L (ref 0.5–1.9)
Lactic Acid, Venous: 2.9 mmol/L (ref 0.5–1.9)

## 2022-01-23 LAB — CBC
HCT: 34.7 % — ABNORMAL LOW (ref 36.0–46.0)
Hemoglobin: 10.9 g/dL — ABNORMAL LOW (ref 12.0–15.0)
MCH: 28.8 pg (ref 26.0–34.0)
MCHC: 31.4 g/dL (ref 30.0–36.0)
MCV: 91.6 fL (ref 80.0–100.0)
Platelets: 87 10*3/uL — ABNORMAL LOW (ref 150–400)
RBC: 3.79 MIL/uL — ABNORMAL LOW (ref 3.87–5.11)
RDW: 17.3 % — ABNORMAL HIGH (ref 11.5–15.5)
WBC: 6.3 10*3/uL (ref 4.0–10.5)
nRBC: 1.9 % — ABNORMAL HIGH (ref 0.0–0.2)

## 2022-01-23 LAB — MAGNESIUM
Magnesium: 2.5 mg/dL — ABNORMAL HIGH (ref 1.7–2.4)
Magnesium: 1.6 mg/dL — ABNORMAL LOW (ref 1.7–2.4)

## 2022-01-23 LAB — PROTIME-INR
INR: 2.1 — ABNORMAL HIGH (ref 0.8–1.2)
Prothrombin Time: 22.9 seconds — ABNORMAL HIGH (ref 11.4–15.2)

## 2022-01-23 LAB — MRSA NEXT GEN BY PCR, NASAL: MRSA by PCR Next Gen: NOT DETECTED

## 2022-01-23 LAB — DIGOXIN LEVEL: Digoxin Level: 0.4 ng/mL — ABNORMAL LOW (ref 0.8–2.0)

## 2022-01-23 MED ORDER — ACETAMINOPHEN 325 MG PO TABS
ORAL_TABLET | ORAL | Status: AC
  Filled 2022-01-23: qty 2

## 2022-01-23 MED ORDER — ENSURE ENLIVE PO LIQD
237.0000 mL | Freq: Two times a day (BID) | ORAL | Status: DC
  Administered 2022-01-25 – 2022-01-26 (×2): 237 mL via ORAL

## 2022-01-23 MED ORDER — DOCUSATE SODIUM 100 MG PO CAPS
100.0000 mg | ORAL_CAPSULE | Freq: Two times a day (BID) | ORAL | Status: DC
Start: 2022-01-23 — End: 2022-01-29
  Administered 2022-01-23 – 2022-01-27 (×6): 100 mg via ORAL
  Filled 2022-01-23 (×10): qty 1

## 2022-01-23 MED ORDER — ALBUTEROL SULFATE (2.5 MG/3ML) 0.083% IN NEBU
2.5000 mg | INHALATION_SOLUTION | Freq: Four times a day (QID) | RESPIRATORY_TRACT | Status: DC | PRN
  Administered 2022-01-23 – 2022-01-29 (×5): 2.5 mg via RESPIRATORY_TRACT
  Filled 2022-01-23 (×7): qty 3

## 2022-01-23 MED ORDER — MAGNESIUM SULFATE 2 GM/50ML IV SOLN
2.0000 g | Freq: Once | INTRAVENOUS | Status: AC
Start: 2022-01-23 — End: 2022-01-24
  Administered 2022-01-23: 2 g via INTRAVENOUS
  Filled 2022-01-23: qty 50

## 2022-01-23 MED ORDER — LACTATED RINGERS IV SOLN: INTRAVENOUS | Status: DC

## 2022-01-23 MED ORDER — MAGNESIUM OXIDE -MG SUPPLEMENT 400 (240 MG) MG PO TABS
400.0000 mg | ORAL_TABLET | Freq: Two times a day (BID) | ORAL | Status: DC
  Administered 2022-01-23 – 2022-01-24 (×3): 400 mg via ORAL
  Filled 2022-01-23 (×3): qty 1

## 2022-01-23 MED ORDER — PHENYLEPH-SHARK LIV OIL-MO-PET 0.25-3-14-71.9 % RE OINT: 1.0000 "application " | TOPICAL_OINTMENT | Freq: Two times a day (BID) | RECTAL | Status: DC | PRN

## 2022-01-23 MED ORDER — UMECLIDINIUM BROMIDE 62.5 MCG/ACT IN AEPB
1.0000 | INHALATION_SPRAY | Freq: Every day | RESPIRATORY_TRACT | Status: DC
  Administered 2022-01-24: 1 via RESPIRATORY_TRACT
  Filled 2022-01-23: qty 7

## 2022-01-23 MED ORDER — PERFLUTREN LIPID MICROSPHERE
1.0000 mL | INTRAVENOUS | Status: AC | PRN
  Administered 2022-01-23: 5 mL via INTRAVENOUS

## 2022-01-23 MED ORDER — WARFARIN - PHARMACIST DOSING INPATIENT: Freq: Every day | Status: DC

## 2022-01-23 MED ORDER — ACETAMINOPHEN 325 MG PO TABS
650.0000 mg | ORAL_TABLET | Freq: Four times a day (QID) | ORAL | Status: DC | PRN
  Administered 2022-01-24 – 2022-01-25 (×2): 650 mg via ORAL
  Filled 2022-01-23 (×2): qty 2

## 2022-01-23 MED ORDER — ACETAMINOPHEN 325 MG PO TABS
650.0000 mg | ORAL_TABLET | Freq: Four times a day (QID) | ORAL | Status: DC | PRN
Start: 2022-01-23 — End: 2022-01-23
  Administered 2022-01-23: 650 mg via ORAL

## 2022-01-23 MED ORDER — HYDROCORTISONE (PERIANAL) 2.5 % EX CREA: TOPICAL_CREAM | Freq: Two times a day (BID) | CUTANEOUS | Status: DC | PRN

## 2022-01-23 MED ORDER — MOMETASONE FURO-FORMOTEROL FUM 100-5 MCG/ACT IN AERO
2.0000 | INHALATION_SPRAY | Freq: Two times a day (BID) | RESPIRATORY_TRACT | Status: DC
  Administered 2022-01-23 – 2022-01-24 (×2): 2 via RESPIRATORY_TRACT
  Filled 2022-01-23: qty 8.8

## 2022-01-23 MED ORDER — BUDESON-GLYCOPYRROL-FORMOTEROL 160-9-4.8 MCG/ACT IN AERO: 2.0000 | INHALATION_SPRAY | Freq: Two times a day (BID) | RESPIRATORY_TRACT | Status: DC

## 2022-01-23 MED ORDER — ADULT MULTIVITAMIN W/MINERALS CH
1.0000 | ORAL_TABLET | Freq: Every day | ORAL | Status: DC
  Administered 2022-01-23 – 2022-01-26 (×4): 1 via ORAL
  Filled 2022-01-23 (×4): qty 1

## 2022-01-23 MED ORDER — POLYETHYLENE GLYCOL 3350 17 G PO PACK
17.0000 g | PACK | Freq: Every day | ORAL | Status: DC
  Administered 2022-01-23 – 2022-01-27 (×4): 17 g via ORAL
  Filled 2022-01-23 (×4): qty 1

## 2022-01-23 MED ORDER — WARFARIN SODIUM 5 MG PO TABS
5.0000 mg | ORAL_TABLET | Freq: Once | ORAL | Status: AC
Start: 2022-01-23 — End: 2022-01-23
  Administered 2022-01-23: 5 mg via ORAL
  Filled 2022-01-23: qty 1

## 2022-01-23 MED ORDER — PANTOPRAZOLE SODIUM 40 MG PO TBEC
40.0000 mg | DELAYED_RELEASE_TABLET | Freq: Every day | ORAL | Status: DC
  Administered 2022-01-23 – 2022-01-27 (×5): 40 mg via ORAL
  Filled 2022-01-23 (×5): qty 1

## 2022-01-23 NOTE — Plan of Care (Signed)
  Problem: Acute Rehab PT Goals(only PT should resolve) Goal: Pt Will Go Supine/Side To Sit Outcome: Progressing Flowsheets (Taken 01/23/2022 1550) Pt will go Supine/Side to Sit: with minimal assist Goal: Patient Will Transfer Sit To/From Stand Outcome: Progressing Flowsheets (Taken 01/23/2022 1550) Patient will transfer sit to/from stand: with minimal assist Goal: Pt Will Transfer Bed To Chair/Chair To Bed Outcome: Progressing Flowsheets (Taken 01/23/2022 1550) Pt will Transfer Bed to Chair/Chair to Bed: with min assist Goal: Pt Will Ambulate Outcome: Progressing Flowsheets (Taken 01/23/2022 1550) Pt will Ambulate:  25 feet  with minimal assist  with moderate assist  with rolling walker   3:50 PM, 01/23/22 Lonell Grandchild, MPT Physical Therapist with [facility] [phone] office [phone] mobile phone

## 2022-01-23 NOTE — Evaluation (Signed)
Physical Therapy Evaluation Patient Details Name: [name] MRN: [id number] DOB: [birth year] Today's Date: 01/23/2022  History of Present Illness  [name] is a 88 y.o. female with medical history significant of chronic atrial fibrillation, COPD, GERD who presents to the emergency department due to 2-3 day onset of generalized weakness.  Patient states that she has not been eating or drinking within the last 2 to 3 days, she endorsed weakness and that all she wanted to do was to sleep.  She endorsed occasional shortness of breath, but denies fever, chills, excessive urination, burning sensation with urination or any other irritative bladder symptoms, chest pain, headache or having any sick contact.  Patient states that she lives with niece.   Clinical Impression  Patient demonstrates slow labored movement for sitting up at bedside requiring frequent rest breaks due to fatigue, unsteady labored movement when taking steps at bedside with flexed trunk having to lean over RW for support and limited to a few steps at bedside before having to sit due to c/o of generalized weakness.  Patient tolerated sitting up in chair after therapy - RN aware.  Patient will benefit from continued skilled physical therapy in hospital and recommended venue below to increase strength, balance, endurance for safe ADLs and gait.          Recommendations for follow up therapy are one component of a multi-disciplinary discharge planning process, led by the attending physician.  Recommendations may be updated based on patient status, additional functional criteria and insurance authorization.  Follow Up Recommendations Skilled nursing-short term rehab (<3 hours/day)    Assistance Recommended at Discharge Intermittent Supervision/Assistance  Patient can return home with the following  A lot of help with bathing/dressing/bathroom;A lot of help with walking and/or transfers;Help with stairs or ramp for  entrance;Assistance with cooking/housework    Equipment Recommendations None recommended by PT  Recommendations for Other Services       Functional Status Assessment Patient has had a recent decline in their functional status and demonstrates the ability to make significant improvements in function in a reasonable and predictable amount of time.     Precautions / Restrictions Precautions Precautions: Fall Restrictions Weight Bearing Restrictions: No      Mobility  Bed Mobility Overal bed mobility: Needs Assistance Bed Mobility: Supine to Sit     Supine to sit: Min assist, Mod assist     General bed mobility comments: increased time, labored movement, frequent rest breaks    Transfers Overall transfer level: Needs assistance   Transfers: Sit to/from Stand, Bed to chair/wheelchair/BSC Sit to Stand: Min assist, Mod assist   Step pivot transfers: Mod assist       General transfer comment: slow labored movement    Ambulation/Gait Ambulation/Gait assistance: Mod assist Gait Distance (Feet): 5 Feet Assistive device: Rolling walker (2 wheels) Gait Pattern/deviations: Decreased step length - right, Decreased step length - left, Decreased stride length, Trunk flexed Gait velocity: decreased     General Gait Details: limited to a few steps at bedside due to generalized weakness and c/o fatigue  Stairs            Wheelchair Mobility    Modified Rankin (Stroke Patients Only)       Balance Overall balance assessment: Needs assistance Sitting-balance support: No upper extremity supported Sitting balance-Leahy Scale: Fair Sitting balance - Comments: fair/good seated at EOB   Standing balance support: During functional activity, Bilateral upper extremity supported Standing balance-Leahy Scale: Poor Standing balance comment: fair/poor  using RW                             Pertinent Vitals/Pain Pain Assessment Pain Assessment: No/denies pain     Home Living Family/patient expects to be discharged to:: Private residence Living Arrangements: Other relatives Available Help at Discharge: Family;Available PRN/intermittently Type of Home: Apartment Home Access: Level entry       Home Layout: One level Home Equipment: Conservation officer, nature (2 wheels);Cane - single point;Wheelchair - manual;BSC/3in1;Shower seat      Prior Function Prior Level of Function : Independent/Modified Independent             Mobility Comments: household ambulator without AD, has not been driving lately ADLs Comments: assisted by family     Hand Dominance   Dominant Hand: Right    Extremity/Trunk Assessment   Upper Extremity Assessment Upper Extremity Assessment: Generalized weakness    Lower Extremity Assessment Lower Extremity Assessment: Generalized weakness    Cervical / Trunk Assessment Cervical / Trunk Assessment: Kyphotic  Communication   Communication: No difficulties  Cognition Arousal/Alertness: Awake/alert Behavior During Therapy: WFL for tasks assessed/performed Overall Cognitive Status: Within Functional Limits for tasks assessed                                          General Comments      Exercises     Assessment/Plan    PT Assessment Patient needs continued PT services  PT Problem List Decreased strength;Decreased activity tolerance;Decreased balance;Decreased mobility       PT Treatment Interventions DME instruction;Gait training;Stair training;Functional mobility training;Therapeutic activities;Therapeutic exercise;Patient/family education;Balance training    PT Goals (Current goals can be found in the Care Plan section)  Acute Rehab PT Goals Patient Stated Goal: return home after rehab PT Goal Formulation: With patient Time For Goal Achievement: 01/23/22 Potential to Achieve Goals: Good    Frequency Min 3X/week     Co-evaluation               AM-PAC PT "6 Clicks" Mobility   Outcome Measure Help needed turning from your back to your side while in a flat bed without using bedrails?: A Little Help needed moving from lying on your back to sitting on the side of a flat bed without using bedrails?: A Lot Help needed moving to and from a bed to a chair (including a wheelchair)?: A Lot Help needed standing up from a chair using your arms (e.g., wheelchair or bedside chair)?: A Lot Help needed to walk in hospital room?: A Lot Help needed climbing 3-5 steps with a railing? : A Lot 6 Click Score: 13    End of Session   Activity Tolerance: Patient tolerated treatment well;Patient limited by fatigue Patient left: in chair;with call bell/phone within reach Nurse Communication: Mobility status PT Visit Diagnosis: Unsteadiness on feet (R26.81);Other abnormalities of gait and mobility (R26.89);Muscle weakness (generalized) (M62.81)    Time: 4268-3419 PT Time Calculation (min) (ACUTE ONLY): 23 min   Charges:   PT Evaluation $PT Eval Moderate Complexity: 1 Mod PT Treatments $Therapeutic Activity: 23-37 mins        3:49 PM, 01/23/22 Lonell Grandchild, MPT Physical Therapist with [facility] [phone] office [phone] mobile phone

## 2022-01-23 NOTE — Progress Notes (Signed)
*  PRELIMINARY RESULTS* Echocardiogram 2D Echocardiogram has been performed.  [name] 01/23/2022, 4:27 PM

## 2022-01-23 NOTE — NC FL2 (Signed)
[place] LEVEL OF CARE SCREENING TOOL     IDENTIFICATION  Patient Name: [name] Birthdate: February 11, 1934 Sex: female Admission Date (Current Location): 01/24/2022  [facility] and [place] Number:  [employer] and Address:  [place] [address] [place]      Provider Number: [id number]  Attending Physician Name and Address:  Flora Lipps, MD  Relative Name and Phone Number:  Valetta Close (Niece) [phone]    Current Level of Care: Hospital Recommended Level of Care: [place] Prior Approval Number:    Date Approved/Denied:   PASRR Number: [id number]  Discharge Plan: SNF    Current Diagnoses: Patient Active Problem List   Diagnosis Date Noted   Acute kidney injury superimposed on CKD ([place]) 01/23/2022   Dehydration 01/23/2022   Thrombocytopenia ([place]) 01/23/2022   Bilateral pleural effusion 01/23/2022   Prolonged QT interval 01/23/2022   Lactic acidosis 01/23/2022   Hyperkalemia 02/03/2022   Diplopia 07/22/2021   Orbital wall fracture ([place]) 05/16/2019   Hyperlipidemia 05/16/2019   Chronic pain disorder 05/12/2019   Depression with anxiety 05/06/2019   GERD without esophagitis 05/06/2019   Chronic constipation 05/06/2019   CKD (chronic kidney disease) stage 3, GFR 30-59 ml/min ([place]) 05/06/2019   Subdural hematoma ([place]) 05/06/2019   Inferior pubic ramus fracture, right, sequela 05/06/2019   Closed right acetabular fracture ([place]) 05/06/2019   Closed nondisplaced fracture of proximal phalanx of right middle finger 05/06/2019   Pelvic hematoma, female 05/06/2019   Fall 04/24/2019   Pneumonia involving right lung 06/18/2018   Hoarseness 07/22/2015   Chronic rhinitis 03/18/2015   Sinusitis, acute maxillary 04/02/2014   Encounter for therapeutic drug monitoring 10/05/2013   Shingles 09/06/2013   Postherpetic neuralgia 09/04/2013   Arthritis, lumbar spine 04/20/2013   Back pain 04/20/2013    Mild anxiety 08/20/2012   Allergic rhinitis due to pollen 11/30/2011   Lung nodules 11/30/2011   Renal insufficiency 01/16/2011   Long term current use of anticoagulant 12/19/2010   ANEMIA 10/09/2010   Secondary cardiomyopathy ([place]) 06/21/2010   Atrial fibrillation ([place]) 05/28/2010   MITRAL REGURGITATION 05/25/2008   BARRETT'S ESOPHAGUS, HX OF 05/25/2008   Asthma with COPD (chronic obstructive pulmonary disease) ([place]) 11/12/2007   Obstructive sleep apnea 11/12/2007   MITRAL VALVE PROLAPSE 09/03/2007    Orientation RESPIRATION BLADDER Height & Weight     Self, Time, Place, Situation  O2 (2L) External catheter, Incontinent Weight: 137 lb 9.1 oz (62.4 kg) Height:  '5\' 6"'$  (167.6 cm)  BEHAVIORAL SYMPTOMS/MOOD NEUROLOGICAL BOWEL NUTRITION STATUS      Continent Diet (Regular)  AMBULATORY STATUS COMMUNICATION OF NEEDS Skin   Extensive Assist Verbally Normal                       Personal Care Assistance Level of Assistance  Bathing, Feeding, Dressing Bathing Assistance: Limited assistance Feeding assistance: Independent Dressing Assistance: Limited assistance     Functional Limitations Info  Sight, Hearing, Speech Sight Info: Adequate Hearing Info: Adequate Speech Info: Adequate    SPECIAL CARE FACTORS FREQUENCY  PT (By licensed PT), OT (By licensed OT)     PT Frequency: 5 times weekly OT Frequency: 5 times weekly            Contractures Contractures Info: Not present    Additional Factors Info  Code Status, Allergies Code Status Info: FULL Allergies Info: Amoxicillin-pot Clavulanate, Clarithromycin, Clindamycin, Doxycycline, Penicillins  Current Medications (01/23/2022):  This is the current hospital active medication list Current Facility-Administered Medications  Medication Dose Route Frequency Provider Last Rate Last Admin   acetaminophen (TYLENOL) tablet 650 mg  650 mg Oral Q6H PRN Pokhrel, Laxman, MD       albuterol (PROVENTIL) (2.5  MG/3ML) 0.083% nebulizer solution 2.5 mg  2.5 mg Inhalation Q6H PRN Pokhrel, Laxman, MD       amiodarone (NEXTERONE PREMIX) 360-4.14 MG/200ML-% (1.8 mg/mL) IV infusion  30 mg/hr Intravenous Continuous Adefeso, Oladapo, DO 16.67 mL/hr at 01/23/22 0439 30 mg/hr at 01/23/22 0439   Chlorhexidine Gluconate Cloth 2 % PADS 6 each  6 each Topical Daily Adefeso, Oladapo, DO   6 each at 01/23/22 1204   docusate sodium (COLACE) capsule 100 mg  100 mg Oral BID Pokhrel, Laxman, MD   100 mg at 01/23/22 1505   feeding supplement (ENSURE ENLIVE / ENSURE PLUS) liquid 237 mL  237 mL Oral BID BM Adefeso, Oladapo, DO       hydrocortisone (ANUSOL-HC) 2.5 % rectal cream   Rectal BID PRN Madueme, Elvira C, RPH       magnesium oxide (MAG-OX) tablet 400 mg  400 mg Oral BID Pokhrel, Laxman, MD   400 mg at 01/23/22 1610   mometasone-formoterol (DULERA) 100-5 MCG/ACT inhaler 2 puff  2 puff Inhalation BID Madueme, Elvira C, RPH       multivitamin with minerals tablet 1 tablet  1 tablet Oral Daily Pokhrel, Laxman, MD   1 tablet at 01/23/22 1506   ondansetron (ZOFRAN-ODT) disintegrating tablet 4 mg  4 mg Oral Once PRN Adefeso, Oladapo, DO       pantoprazole (PROTONIX) EC tablet 40 mg  40 mg Oral Daily Pokhrel, Laxman, MD   40 mg at 01/23/22 1505   polyethylene glycol (MIRALAX / GLYCOLAX) packet 17 g  17 g Oral Daily Pokhrel, Laxman, MD   17 g at 01/23/22 1505   umeclidinium bromide (INCRUSE ELLIPTA) 62.5 MCG/ACT 1 puff  1 puff Inhalation Daily Madueme, Elvira C, RPH       warfarin (COUMADIN) tablet 5 mg  5 mg Oral ONCE-1600 Pokhrel, Laxman, MD       Warfarin - Pharmacist Dosing Inpatient   Does not apply [id number] Pokhrel, Laxman, MD         Discharge Medications: Please see discharge summary for a list of discharge medications.  Relevant Imaging Results:  Relevant Lab Results:   Additional Information SSN: [id number], [place]

## 2022-01-23 NOTE — Progress Notes (Signed)
PROGRESS NOTE    [name]  GEX:[id number] DOB: [birth year] DOA: 01/24/2022 PCP: Redmond School, MD    Brief Narrative:  [name] is a 88 y.o. female with past medical history of chronic atrial fibrillation, COPD, GERD presented to the hospital with generalized weakness for 2 to 3 days with decreased oral intake and increased sleepiness.  She had mild shortness of breath.  In the ED, patient was noted to be tachypneic and tachycardic with a low blood pressure at 91/75.  Mild thrombocytopenia was noted.  Hyperkalemia was noted at 6.7, creatinine was noted at 2.8 from baseline 1.4 with elevated LFTs lactic acid was elevated with mild troponin elevation.  UA was unremarkable.  INR 1.6.  Chest x-ray showed moderate right and small left pleural effusion.  In the ED, patient was given calcium gluconate, D50 IV hydration and patient was admitted to hospital for further evaluation and treatment.   Assessment and plan. Principal Problem:   Hyperkalemia Active Problems:   Atrial fibrillation (HCC)   Acute kidney injury superimposed on CKD (HCC)   Dehydration   Thrombocytopenia (HCC)   Bilateral pleural effusion   Prolonged QT interval   Lactic acidosis   Hyperkalemia Patient was 6.7 on presentation.  Patient was given temporizing measures including calcium gluconate insulin, Lokelma, sodium bicarbonate.  Potassium has improved to 5.4 today.  We will continue to monitor closely.  Patient was taking prednisone as outpatient which she had completed and was supposed to be on Lasix which she was not taking.  Patient was taking potassium supplements at home we will continue to hold for now.  AKI on CKD 4 likely secondary to volume depletion. BUN/creatinine 19/2.83 on presentation.  (baseline creatinine at 1.4).  Creatinine has improved to 1.6 today.  Positive balance with bilateral pleural effusion.  We will discontinue IV fluids at this time.  Lactic acidosis Lactic acid 2.2 > 2.5.  Likely  multifactorial from volume depletion and acute kidney injury.  Improved with IV hydration.  Chronic thrombocytopenia Platelets 127 presentation.  Down trended to 44 today.  We will continue to monitor closely.  Elevated LFT possibly due to shock liver/dehydration AST 70, ALT 75, ALP 216 on presentation.  Down today.  Bilateral pleural effusion More on the right compared to left.  Will need Lasix resumed again by tomorrow.. Currently on gentle IV fluids.  Continue incentive spirometry flutter valve.  Patient is positive balance for 3396.  We will discontinue IV fluids.  Chronic A-fib with RVR On warfarin at home.  Continue warfarin pharmacy to dose.  Currently on amiodarone drip due to rapid ventricular response.  Reinitiate digoxin if levels okay.  Digoxin on the medication list but not taking.  Review of previous 2D echocardiogram from 08/22/2020 showed LV ejection fraction of 45 to 60% with mitral regurgitation and stenosis.  Check TSH in AM.  Repeat 2D echocardiogram at this time.  Patient follows up with Dr. Conni [facility] cardiology as outpatient.  We will consult cardiology at this time due to A-fib with RVR with hypotension and uncertainty of taking medications for it.  Prolonged QTc (517 ms) Replenish electrolytes.  Closely monitor.  Hold Lexapro for now.   Elevated troponin possible secondary to type II demand ischemia Slightly elevated troponin.  No EKG changes or chest pain.  Check 2D echocardiogram.  Obstructive sleep apnea (not on CPAP) Stable  Deconditioning, debility.  We will get PT evaluation.  Patient states that she lives with this niece at home and  has a cane.     DVT prophylaxis: SCDs Start: 01/23/2022 2216 warfarin (COUMADIN) tablet 5 mg   Code Status:     Code Status: Full Code  Disposition: Home  Status is: Inpatient  Remains inpatient appropriate because: Hyperkalemia, AKI, IV fluids, A-fib with RVR on amiodarone drip   Family Communication:  Spoke with  the patient's niece on the phone and updated her about the clinical condition of the patient   Consultants:  Cardiology  Procedures:  None  Antimicrobials:  Rocephin IV will discontinue.  Anti-infectives (From admission, onward)    Start     Dose/Rate Route Frequency Ordered Stop   01/25/2022 1730  cefTRIAXone (ROCEPHIN) 2 g in sodium chloride 0.9 % 100 mL IVPB  Status:  Discontinued        2 g 200 mL/hr over 30 Minutes Intravenous Every 24 hours 01/19/2022 1726 01/23/22 1230      Subjective: Today, patient was seen and examined at bedside.  Patient states that she does not feel well today complains of shortness of breath generalized weakness but denies pain.  Objective: Vitals:   01/23/22 0700 01/23/22 0724 01/23/22 0800 01/23/22 1158  BP: 99/64  103/68   Pulse: (!) 43     Resp: (!) 28  20   Temp:  (!) 96.7 F (35.9 C)  (!) 96.9 F (36.1 C)  TempSrc:  Axillary  Axillary  SpO2: 96%  95%   Weight:      Height:        Intake/Output Summary (Last 24 hours) at 01/23/2022 1247 Last data filed at 01/23/2022 0439 Gross per 24 hour  Intake 3396.39 ml  Output --  Net 3396.39 ml   Filed Weights   02/05/2022 1713 01/14/2022 2212  Weight: 63.5 kg 62.4 kg    Physical Examination: Body mass index is 22.2 kg/m.  General:  Average built, not in obvious distress, on nasal cannula oxygen, elderly female, Communicative HENT:   No scleral pallor or icterus noted. Oral mucosa is moist.  Chest:    Diminished breath sounds bilaterally.  CVS: S1 &S2 heard. No murmur.  Irregularly irregular rhythm with tachycardia.. Abdomen: Soft, nontender, nondistended.  Bowel sounds are heard.   Extremities: No cyanosis, clubbing or edema.  Peripheral pulses are palpable. Psych: Alert, awake and oriented, normal mood CNS:  No cranial nerve deficits.  Power equal in all extremities.   Skin: Warm and dry.  No rashes noted.  Data Reviewed:   CBC: Recent Labs  Lab 02/05/2022 1719 01/09/2022 1744  01/23/22 0412  WBC 8.7 7.9 6.3  NEUTROABS  --  7.0  --   HGB 14.0 13.4 10.9*  HCT 42.7 41.7 34.7*  MCV 86.4 88.9 91.6  PLT 135* 127* 87*    Basic Metabolic Panel: Recent Labs  Lab 01/07/2022 1719 01/25/2022 1744 01/14/2022 1938 01/23/22 0030 01/23/22 0412  NA 133*  --   --  135 134*  K 6.7* 6.7* 6.6* 5.7* 5.4*  CL 99  --   --  105 107  CO2 18*  --   --  17* 12*  GLUCOSE 102*  --   --  120* 82  BUN 90*  --   --  84* 60*  CREATININE 2.83*  --   --  2.66* 1.61*  CALCIUM 9.4  --   --  8.5* 7.6*  MG  --   --   --  2.5* 1.6*  PHOS  --   --   --   --  3.6    Liver Function Tests: Recent Labs  Lab 01/20/2022 1719 01/23/22 0412  AST 70* 52*  ALT 75* 51*  ALKPHOS 216* 149*  BILITOT 2.7* 1.4*  PROT 7.7 4.1*  ALBUMIN 3.7 1.9*     Radiology Studies: DG Chest Port 1 View  Result Date: 01/20/2022 CLINICAL DATA:  Sepsis and weakness. EXAM: PORTABLE CHEST 1 VIEW COMPARISON:  Chest x-ray 12/12/2021 FINDINGS: The cardiac silhouette, mediastinal and hilar contours are stable. Moderate-sized right and small left pleural effusion with overlying atelectasis. No pulmonary edema or pulmonary lesions. IMPRESSION: Moderate-sized right and small left pleural effusions with overlying atelectasis. Electronically Signed   By: Marijo Sanes M.D.   On: 01/10/2022 17:41      LOS: 1 day    Flora Lipps, MD [facility] Available via Epic secure chat 7am-7pm After these hours, please refer to coverage provider listed on [email or web address] 01/23/2022, 12:47 PM

## 2022-01-23 NOTE — Progress Notes (Signed)
ANTICOAGULATION CONSULT NOTE - Initial Consult  Pharmacy Consult for Coumadin Indication: atrial fibrillation  Allergies  Allergen Reactions   Amoxicillin-Pot Clavulanate Itching and Swelling   Clarithromycin Itching and Swelling   Clindamycin Itching and Swelling   Doxycycline Itching and Swelling   Penicillins Itching and Swelling    Did it involve swelling of the face/tongue/throat, SOB, or low BP? Yes Did it involve sudden or severe rash/hives, skin peeling, or any reaction on the inside of your mouth or nose? No Did you need to seek medical attention at a hospital or doctor's office? Yes When did it last happen? Over 10 years     If all above answers are "NO", may proceed with cephalosporin use.     Patient Measurements: Height: '5\' 6"'$  (167.6 cm) Weight: 62.4 kg (137 lb 9.1 oz) IBW/kg (Calculated) : 59.3  Vital Signs: Temp: 96.7 F (35.9 C) (05/18 0724) Temp Source: Axillary (05/18 0724) BP: 103/68 (05/18 0800) Pulse Rate: 43 (05/18 0700)  Labs: Recent Labs    01/17/2022 1719 01/10/2022 1744 01/30/2022 1931 01/23/22 0030 01/23/22 0412  HGB 14.0 13.4  --   --  10.9*  HCT 42.7 41.7  --   --  34.7*  PLT 135* 127*  --   --  87*  APTT  --  29  --   --   --   LABPROT  --  18.9*  --   --  22.9*  INR  --  1.6*  --   --  2.1*  CREATININE 2.83*  --   --  2.66* 1.61*  TROPONINIHS  --  77* 69*  --   --     Estimated Creatinine Clearance: 23.9 mL/min (A) (by C-G formula based on SCr of 1.61 mg/dL (H)).   Medical History: Past Medical History:  Diagnosis Date   Asthma    Atrial fibrillation (HCC)    Chronic rhinitis    COPD (chronic obstructive pulmonary disease) (HCC)    GERD (gastroesophageal reflux disease)    Mitral regurgitation    Mitral valve prolapse    Nasal polyps    Nonischemic cardiomyopathy (HCC)    LVEF 25-30% improved to 45-50%   Obstructive sleep apnea    Transudative pleural effusion     Medications:  Medications Prior to Admission  Medication  Sig Dispense Refill Last Dose   acetaminophen (TYLENOL) 325 MG tablet Take 2 tablets (650 mg total) by mouth every 6 (six) hours as needed for mild pain, fever or headache.   unknown   albuterol (PROVENTIL) (2.5 MG/3ML) 0.083% nebulizer solution USE 1 VIAL IN NEBULIZER EVERY 6 HOURS AS NEEDED FOR WHEEZING OR SHORTNESS OF BREATH (Patient taking differently: Take 2.5 mg by nebulization every 6 (six) hours as needed for shortness of breath or wheezing.) 75 mL 12 01/12/2022   albuterol (VENTOLIN HFA) 108 (90 Base) MCG/ACT inhaler TAKE 2 PUFFS BY MOUTH EVERY 6 HOURS AS NEEDED FOR WHEEZE OR SHORTNESS OF BREATH (Patient taking differently: Inhale 2 puffs into the lungs every 6 (six) hours as needed for shortness of breath or wheezing.) 6.7 each 5 01/16/2022   Budeson-Glycopyrrol-Formoterol (BREZTRI AEROSPHERE) 160-9-4.8 MCG/ACT AERO Inhale 2 puffs into the lungs in the morning and at bedtime. 10.7 g 5 01/21/2022   docusate sodium (COLACE) 100 MG capsule Take 100 mg by mouth 2 (two) times daily.   unknown   escitalopram (LEXAPRO) 10 MG tablet Take 1 tablet (10 mg total) by mouth every morning. 30 tablet 0 01/21/2022   furosemide (  LASIX) 40 MG tablet Take 1 tablet (40 mg total) by mouth daily. 30 tablet 0 01/08/2022   ipratropium (ATROVENT) 0.03 % nasal spray Place 2 sprays into both nostrils every 12 (twelve) hours. 30 mL 4 unknown   phenylephrine-shark liver oil-mineral oil-petrolatum (PREPARATION H) 0.25-3-14-71.9 % rectal ointment Place 1 application rectally 2 (two) times daily as needed for hemorrhoids.   unknown   potassium chloride SA (K-DUR) 20 MEQ tablet Take 1 tablet (20 mEq total) by mouth daily. 30 tablet 0 01/21/2022   warfarin (COUMADIN) 5 MG tablet TAKE 1 TABLET BY MOUTH EVERY DAY OR AS DIRECTED 90 tablet 3 01/21/2022   digoxin (LANOXIN) 0.125 MG tablet TAKE 1 TABLET EVERY DAY (Patient not taking: Reported on 01/10/2022) 90 tablet 1 Not Taking   esomeprazole (NEXIUM) 40 MG capsule Take 1 capsule (40 mg  total) by mouth 2 (two) times daily. 60 capsule 0    furosemide (LASIX) 40 MG tablet TAKE 1 TABLET BY MOUTH EVERY DAY (Patient not taking: Reported on 01/21/2022) 90 tablet 3 Not Taking   NON FORMULARY Diet- Regular      polyethylene glycol (MIRALAX / GLYCOLAX) 17 g packet Take 17 g by mouth daily. (Patient not taking: Reported on 01/25/2022) 14 each 0 Not Taking   predniSONE (DELTASONE) 10 MG tablet Take 4 tabs for 2 days, then 3 tabs for 2 days, 2 tabs for 2 days, then 1 tab for 2 days, then stop. (Patient not taking: Reported on 02/01/2022) 20 tablet 0 Completed Course    Assessment: 88 y.o. female with medical history significant of chronic atrial fibrillation, COPD, GERD who presents to the emergency department due to 2-3 day onset of generalized weakness. Patient with hyperkalemia and afib. Patient on chronic coumadin for afib. Home dose is '5mg'$  daily per med rec. Last anticoag visit in Nov 2022, patient taking '5mg'$  daily except 2.'5mg'$  on Sunday.  Goal of Therapy:  INR 2-3 Monitor platelets by anticoagulation protocol: Yes   Plan:  Coumadin '5mg'$  po x 1 today PT-INR daily Monitor for bleeding CBC MWF  Isac Sarna, BS Pharm D, BCPS Clinical Pharmacist 01/23/2022,11:38 AM

## 2022-01-23 NOTE — TOC Initial Note (Signed)
Transition of Care [facility]) - Initial/Assessment Note    Patient Details  Name: [name] MRN: [id number] Date of Birth: [birth year]  Transition of Care [facility]) CM/SW Contact:    Iona Beard, [place] Phone Number: 01/23/2022, 3:08 PM  Clinical Narrative:                 Pt is high risk for readmission. CSW spoke in room with pt to complete assessment. Pt states she lives with her niece and that she does need assistance with her ADLs. CSW also spoke with pts niece who confirms the above. CSW spoke with pt and niece about PT recommendation for SNF. Both are agreeable to this and the referral being sent to local facilities. CSW sent referral out and will follow up with family and pt. TOC to follow.   Expected Discharge Plan: Skilled Nursing Facility Barriers to Discharge: Continued Medical Work up   Patient Goals and CMS Choice Patient states their goals for this hospitalization and ongoing recovery are:: go to SNF CMS Medicare.gov Compare Post Acute Care list provided to:: Patient Choice offered to / list presented to : Patient  Expected Discharge Plan and Services Expected Discharge Plan: [place] Choice: [place] Living arrangements for the past 2 months: Apartment                                      Prior Living Arrangements/Services Living arrangements for the past 2 months: Apartment Lives with:: Relatives Patient language and need for interpreter reviewed:: Yes Do you feel safe going back to the place where you live?: Yes      Need for Family Participation in Patient Care: Yes (Comment) Care giver support system in place?: Yes (comment) Current home services: DME Criminal Activity/Legal Involvement Pertinent to Current Situation/Hospitalization: No - Comment as needed  Activities of Daily Living Home Assistive Devices/Equipment: Grab bars in shower, Cane (specify quad or straight) ADL Screening (condition  at time of admission) Patient's cognitive ability adequate to safely complete daily activities?: Yes Is the patient deaf or have difficulty hearing?: No Does the patient have difficulty seeing, even when wearing glasses/contacts?: No Does the patient have difficulty concentrating, remembering, or making decisions?: No Patient able to express need for assistance with ADLs?: Yes Does the patient have difficulty dressing or bathing?: Yes Independently performs ADLs?: No Communication: Independent Dressing (OT): Needs assistance Is this a change from baseline?: Pre-admission baseline Grooming: Independent Feeding: Independent Bathing: Needs assistance Is this a change from baseline?: Pre-admission baseline Toileting: Independent In/Out Bed: Needs assistance Is this a change from baseline?: Pre-admission baseline Walks in Home: Independent Does the patient have difficulty walking or climbing stairs?: Yes Weakness of Legs: Both Weakness of Arms/Hands: Both  Permission Sought/Granted                  Emotional Assessment Appearance:: Appears stated age Attitude/Demeanor/Rapport: Engaged Affect (typically observed): Accepting Orientation: : Oriented to Self, Oriented to Place, Oriented to  Time, Oriented to Situation Alcohol / Substance Use: Not Applicable Psych Involvement: No (comment)  Admission diagnosis:  Hyperkalemia [E87.5] Atrial fibrillation with rapid ventricular response (HCC) [I48.91] AKI (acute kidney injury) ([place]) [N17.9] Patient Active Problem List   Diagnosis Date Noted   Acute kidney injury superimposed on CKD ([place]) 01/23/2022   Dehydration 01/23/2022   Thrombocytopenia ([place]) 01/23/2022   Bilateral pleural  effusion 01/23/2022   Prolonged QT interval 01/23/2022   Lactic acidosis 01/23/2022   Hyperkalemia 01/10/2022   Diplopia 07/22/2021   Orbital wall fracture ([place]) 05/16/2019   Hyperlipidemia 05/16/2019   Chronic pain disorder 05/12/2019   Depression  with anxiety 05/06/2019   GERD without esophagitis 05/06/2019   Chronic constipation 05/06/2019   CKD (chronic kidney disease) stage 3, GFR 30-59 ml/min (HCC) 05/06/2019   Subdural hematoma ([place]) 05/06/2019   Inferior pubic ramus fracture, right, sequela 05/06/2019   Closed right acetabular fracture ([place]) 05/06/2019   Closed nondisplaced fracture of proximal phalanx of right middle finger 05/06/2019   Pelvic hematoma, female 05/06/2019   Fall 04/24/2019   Pneumonia involving right lung 06/18/2018   Hoarseness 07/22/2015   Chronic rhinitis 03/18/2015   Sinusitis, acute maxillary 04/02/2014   Encounter for therapeutic drug monitoring 10/05/2013   Shingles 09/06/2013   Postherpetic neuralgia 09/04/2013   Arthritis, lumbar spine 04/20/2013   Back pain 04/20/2013   Mild anxiety 08/20/2012   Allergic rhinitis due to pollen 11/30/2011   Lung nodules 11/30/2011   Renal insufficiency 01/16/2011   Long term current use of anticoagulant 12/19/2010   ANEMIA 10/09/2010   Secondary cardiomyopathy ([place]) 06/21/2010   Atrial fibrillation ([place]) 05/28/2010   MITRAL REGURGITATION 05/25/2008   BARRETT'S ESOPHAGUS, HX OF 05/25/2008   Asthma with COPD (chronic obstructive pulmonary disease) ([place]) 11/12/2007   Obstructive sleep apnea 11/12/2007   MITRAL VALVE PROLAPSE 09/03/2007   PCP:  Redmond School, MD Pharmacy:   [id number] [place] [id number]: [phone]: [phone] [facility] [id number]Phone: [phone]: [phone] [place] [place]Phone: [phone]: [phone]    Social Determinants of Health (SDOH) Interventions    Readmission Risk Interventions    01/23/2022     3:07 PM  Readmission Risk Prevention Plan  Transportation Screening Complete  [place] or [place]  Social Work Consult for [place]anning/Counseling Complete  Palliative Care Screening Not Applicable  Medication Review (Press photographer Complete

## 2022-01-23 NOTE — Hospital Course (Addendum)
[name] is a 88 y.o. female with past medical history of chronic atrial fibrillation, COPD, GERD presented to the hospital with generalized weakness for 2 to 3 days with decreased oral intake and increased sleepiness.  She had mild shortness of breath.  In the ED, patient was noted to be tachypneic and tachycardic with a low blood pressure at 91/75.  Mild thrombocytopenia was noted.  Hyperkalemia was noted at 6.7, creatinine was noted at 2.8 from baseline 1.4 with elevated LFTs lactic acid was elevated with mild troponin elevation.  UA was unremarkable.  INR 1.6.  Chest x-ray showed moderate right and small left pleural effusion.  In the ED, patient was given calcium gluconate, D50 IV hydration and patient was admitted to hospital for further evaluation and treatment.   Assessment and plan. Principal Problem:   Hyperkalemia Active Problems:   Atrial fibrillation (HCC)   Acute kidney injury superimposed on CKD (HCC)   Dehydration   Thrombocytopenia (HCC)   Bilateral pleural effusion   Prolonged QT interval   Lactic acidosis   Malnutrition of moderate degree   Biventricular failure (HCC)   Cardiogenic shock (HCC)    Hypotension overnight.  Likely cardiogenic shock.  No signs of infection.  Patient received central line and Levophed.    [name]  Cardiology on board and recommended transfer to [facility].  Patient.  Patient wishes to proceed with further treatment.  Severe systolic cardiomyopathy/  Cardiogenic shock with biventricular failure.  Patient needed Levophed during the night.  We will need arterial line and advanced heart failure care.  Milrinone will also be initiated as per cardiology.  Hyperkalemia Patient was 6.7 on presentation.  Patient was given temporizing measures including calcium gluconate insulin, Lokelma, sodium bicarbonate.  Potassium has improved to 5.5 today.  We will continue  to monitor closely.  Patient was taking prednisone as outpatient which she had completed and was supposed to be on Lasix which she was not taking.  Patient was taking potassium supplements at home we will continue to hold for now.  AKI on CKD 4 likely secondary to volume depletion. BUN/creatinine 19/2.83 on presentation.  (baseline creatinine at 1.4).  Creatinine of 2.6 today from initial improvement of 1.6 yesterday.  Patient persisted to have hypotension yesterday which could have contributed to mild AKI.  Positive fluid balance of 4749 mL with bilateral pleural effusion.  Dose at this time due to hypotension including Levophed  Lactic acidosis Lactic acid 2.2 > 2.5>1.5.  Likely multifactorial from volume depletion and acute kidney injury.  Improved   Chronic thrombocytopenia Platelets 127 presentation.  Down trended to 87 on 01/23/2022.  We will continue to monitor.    Elevated LFT possibly due to shock liver/dehydration AST 70, ALT 75, ALP 216 on presentation.  Slightly up trended today.  Bilateral pleural effusion More on the right compared to left.  On IV fluids due to hypotension during the nighttime.  Continue incentive spirometry flutter valve.  Patient is positive balance at this time.  Nasal cannula at 2 L/min at this time.  Chronic A-fib with RVR On warfarin at home.  Continue warfarin pharmacy to dose.  Currently on amiodarone drip due to rapid ventricular response. Digoxin on the medication list but not taking.  Review of previous 2D echocardiogram from 08/22/2020 showed LV ejection fraction of 45 to 60% with mitral regurgitation and stenosis.   2D echocardiogram at this time  shows LV function less than 20% with reduced right ventricular function with severely dilated bilateral atrium, large pleural effusion, severe mitral valve stenosis moderate MR. Cardiology on board and recommended transfer to [facility].  Patient follows up with [name] [facility] cardiology as  outpatient.   Prolonged QTc (517 ms) Patient is better replenish electrolytes.  Closely monitor.  Hold Lexapro for now.   Elevated troponin possible secondary to type II demand ischemia Slightly elevated troponin.  No EKG changes or chest pain.  2D echocardiogram with reduced LV function.  Changed compared to previous echocardiogram from 2021.    Obstructive sleep apnea (not on CPAP) Nasal cannula oxygen at this time. Moderate protein calorie malnutrition.  Present on admission.  Continue Ensure supplement.  Deconditioning, debility.  Physical therapy evaluation on 01/23/2022 recommended skilled nursing facility on discharge.  Patient states that she lives with this niece at home and has a cane.  Goals of care.  Discussed with the patient patient is full code.  She wishes to proceed with further treatment as per cardiology.

## 2022-01-23 NOTE — Progress Notes (Signed)
Initial Nutrition Assessment  DOCUMENTATION CODES:   Non-severe (moderate) malnutrition in context of chronic illness  INTERVENTION:  - Liberalize diet from a heart healthy to a regular diet to provide widest variety of menu options to enhance nutritional adequacy  - Ensure Enlive po BID, each supplement provides 350 kcal and 20 grams of protein.  - MVI with minerals daily  NUTRITION DIAGNOSIS:   Moderate Malnutrition related to chronic illness (afib, COPD, advanced age) as evidenced by mild fat depletion, moderate fat depletion, moderate muscle depletion, mild muscle depletion.  GOAL:   Patient will meet greater than or equal to 90% of their needs  MONITOR:   PO intake, Supplement acceptance, Diet advancement, Labs, Weight trends  REASON FOR ASSESSMENT:   Malnutrition Screening Tool    ASSESSMENT:   Pt admitted with generalized weakness and nausea found to have hyperkalemia. PMH significant for chronic afib, COPD, GERD.  Spoke with pt at bedside. Provided therapeutic listening as she expressed that she is waiting for her family to visit and is ready to go home. Explained that medical team is ensuring that she is medically stable prior to discharge which she is thankful for. Pt reports that she has had a poor appetite for a while but did not quantify how long or how much she has been consuming. Observed pt had eaten half of an orange sherbet.   Documented meal completions: 05/18: 0%-breakfast  Pt would benefit from continuation of nutrition supplement and a liberalized diet as to not restrict meal options. Suspect pt is not likely to exceed nutritional limits with meal intakes.   Pt is unsure of her usual weight and does not suspect recent weight loss. Reviewed weight history. Pt is noted to have had a 10% weight loss within the last 10 months which is not clinically significant for time frame.Will continue to monitor throughout admission.   Medications: Mag-ox IV drips:  rocephin, LR @ 147m/hr  Labs: sodium 134, potassium 5.4, BUN 60, Cr 1.61, Mg 1.6, alkaline phosphatase 149, AST 52, ALT 51  NUTRITION - FOCUSED PHYSICAL EXAM:  Flowsheet Row Most Recent Value  Orbital Region Mild depletion  Upper Arm Region Moderate depletion  Thoracic and Lumbar Region Mild depletion  Buccal Region Moderate depletion  Temple Region No depletion  Clavicle Bone Region Moderate depletion  Clavicle and Acromion Bone Region Severe depletion  Scapular Bone Region Mild depletion  Dorsal Hand Mild depletion  Patellar Region Moderate depletion  Anterior Thigh Region Mild depletion  Posterior Calf Region Moderate depletion  Edema (RD Assessment) None  Hair Reviewed  Eyes Reviewed  Mouth Reviewed  Skin Reviewed  Nails Reviewed       Diet Order:   Diet Order             Diet regular Room service appropriate? Yes; Fluid consistency: Thin  Diet effective now                   EDUCATION NEEDS:   No education needs have been identified at this time  Skin:  Skin Assessment: Reviewed RN Assessment  Last BM:  PTA  Height:   Ht Readings from Last 1 Encounters:  02/05/2022 '5\' 6"'$  (1.676 m)    Weight:   Wt Readings from Last 1 Encounters:  02/04/2022 62.4 kg   BMI:  Body mass index is 22.2 kg/m.  Estimated Nutritional Needs:   Kcal:  1500-1700  Protein:  75-90g  Fluid:  >/=1.5L  AClayborne Dana RDN, LDN Clinical Nutrition

## 2022-01-23 NOTE — Plan of Care (Signed)
Patient arrived to unit from ED via stretcher alert and oriented, able to answer questions appropriately and communicate needs. Patient  oriented to staff room equipment, skin assessment performed and CHG bath given. Patient offered fluids positioned for comfort with call bell in reach.     Problem: Health Behavior/Discharge Planning: Goal: Ability to manage health-related needs will improve Outcome: Progressing   Problem: Nutrition: Goal: Adequate nutrition will be maintained Outcome: Progressing   Problem: Education: Goal: Knowledge of disease or condition will improve Outcome: Progressing Goal: Understanding of medication regimen will improve Outcome: Progressing Goal: Individualized Educational Video(s) Outcome: Progressing   Problem: Activity: Goal: Ability to tolerate increased activity will improve Outcome: Progressing   Problem: Cardiac: Goal: Ability to achieve and maintain adequate cardiopulmonary perfusion will improve Outcome: Progressing   Problem: Health Behavior/Discharge Planning: Goal: Ability to safely manage health-related needs after discharge will improve Outcome: Progressing

## 2022-01-23 NOTE — Progress Notes (Signed)
Left hand , wrist towards elbow is cold, heavy to lift. Pt complains of pain . Unable to palpate pulse , will doppler left radial pulse . Arm is cyanotic in color. Contacted on call provider

## 2022-01-24 ENCOUNTER — Inpatient Hospital Stay (HOSPITAL_COMMUNITY): Payer: Medicare PPO

## 2022-01-24 DIAGNOSIS — J90 Pleural effusion, not elsewhere classified: Secondary | ICD-10-CM | POA: Diagnosis not present

## 2022-01-24 DIAGNOSIS — R570 Cardiogenic shock: Secondary | ICD-10-CM

## 2022-01-24 DIAGNOSIS — I950 Idiopathic hypotension: Secondary | ICD-10-CM

## 2022-01-24 DIAGNOSIS — L8990 Pressure ulcer of unspecified site, unspecified stage: Secondary | ICD-10-CM | POA: Insufficient documentation

## 2022-01-24 DIAGNOSIS — M7989 Other specified soft tissue disorders: Secondary | ICD-10-CM | POA: Diagnosis not present

## 2022-01-24 DIAGNOSIS — I4891 Unspecified atrial fibrillation: Secondary | ICD-10-CM

## 2022-01-24 DIAGNOSIS — I5043 Acute on chronic combined systolic (congestive) and diastolic (congestive) heart failure: Secondary | ICD-10-CM

## 2022-01-24 DIAGNOSIS — I342 Nonrheumatic mitral (valve) stenosis: Secondary | ICD-10-CM

## 2022-01-24 DIAGNOSIS — J9601 Acute respiratory failure with hypoxia: Secondary | ICD-10-CM

## 2022-01-24 DIAGNOSIS — N189 Chronic kidney disease, unspecified: Secondary | ICD-10-CM | POA: Diagnosis not present

## 2022-01-24 DIAGNOSIS — I482 Chronic atrial fibrillation, unspecified: Secondary | ICD-10-CM | POA: Diagnosis not present

## 2022-01-24 DIAGNOSIS — N179 Acute kidney failure, unspecified: Secondary | ICD-10-CM | POA: Diagnosis not present

## 2022-01-24 DIAGNOSIS — I5082 Biventricular heart failure: Secondary | ICD-10-CM

## 2022-01-24 DIAGNOSIS — E875 Hyperkalemia: Secondary | ICD-10-CM | POA: Diagnosis not present

## 2022-01-24 DIAGNOSIS — E440 Moderate protein-calorie malnutrition: Secondary | ICD-10-CM

## 2022-01-24 LAB — COOXEMETRY PANEL
Carboxyhemoglobin: 0.9 % (ref 0.5–1.5)
Carboxyhemoglobin: 1 % (ref 0.5–1.5)
Methemoglobin: 0.8 % (ref 0.0–1.5)
Methemoglobin: <0.7 % (ref 0.0–1.5)
O2 Saturation: 57.8 %
O2 Saturation: 49.2 %
Total hemoglobin: 13.7 g/dL (ref 12.0–16.0)
Total hemoglobin: 12.5 g/dL (ref 12.0–16.0)

## 2022-01-24 LAB — CBC
HCT: 37.6 % (ref 36.0–46.0)
Hemoglobin: 12.6 g/dL (ref 12.0–15.0)
MCH: 28.4 pg (ref 26.0–34.0)
MCHC: 33.5 g/dL (ref 30.0–36.0)
MCV: 84.9 fL (ref 80.0–100.0)
Platelets: 105 10*3/uL — ABNORMAL LOW (ref 150–400)
RBC: 4.43 MIL/uL (ref 3.87–5.11)
RDW: 17.2 % — ABNORMAL HIGH (ref 11.5–15.5)
WBC: 8.1 10*3/uL (ref 4.0–10.5)
nRBC: 3.6 % — ABNORMAL HIGH (ref 0.0–0.2)

## 2022-01-24 LAB — COMPREHENSIVE METABOLIC PANEL
ALT: 97 U/L — ABNORMAL HIGH (ref 0–44)
AST: 82 U/L — ABNORMAL HIGH (ref 15–41)
Albumin: 2.8 g/dL — ABNORMAL LOW (ref 3.5–5.0)
Alkaline Phosphatase: 336 U/L — ABNORMAL HIGH (ref 38–126)
Anion gap: 12 (ref 5–15)
BUN: 91 mg/dL — ABNORMAL HIGH (ref 8–23)
CO2: 19 mmol/L — ABNORMAL LOW (ref 22–32)
Calcium: 8.7 mg/dL — ABNORMAL LOW (ref 8.9–10.3)
Chloride: 102 mmol/L (ref 98–111)
Creatinine, Ser: 2.66 mg/dL — ABNORMAL HIGH (ref 0.44–1.00)
GFR, Estimated: 17 mL/min — ABNORMAL LOW (ref >60)
Glucose, Bld: 136 mg/dL — ABNORMAL HIGH (ref 70–99)
Potassium: 5.5 mmol/L — ABNORMAL HIGH (ref 3.5–5.1)
Sodium: 133 mmol/L — ABNORMAL LOW (ref 135–145)
Total Bilirubin: 1.5 mg/dL — ABNORMAL HIGH (ref 0.3–1.2)
Total Protein: 6.1 g/dL — ABNORMAL LOW (ref 6.5–8.1)

## 2022-01-24 LAB — MAGNESIUM: Magnesium: 3 mg/dL — ABNORMAL HIGH (ref 1.7–2.4)

## 2022-01-24 LAB — LACTIC ACID, PLASMA
Lactic Acid, Venous: 1.5 mmol/L (ref 0.5–1.9)
Lactic Acid, Venous: 1.5 mmol/L (ref 0.5–1.9)

## 2022-01-24 LAB — BASIC METABOLIC PANEL
Anion gap: 11 (ref 5–15)
BUN: 90 mg/dL — ABNORMAL HIGH (ref 8–23)
CO2: 16 mmol/L — ABNORMAL LOW (ref 22–32)
Calcium: 8.2 mg/dL — ABNORMAL LOW (ref 8.9–10.3)
Chloride: 104 mmol/L (ref 98–111)
Creatinine, Ser: 2.61 mg/dL — ABNORMAL HIGH (ref 0.44–1.00)
GFR, Estimated: 17 mL/min — ABNORMAL LOW (ref >60)
Glucose, Bld: 182 mg/dL — ABNORMAL HIGH (ref 70–99)
Potassium: 4.9 mmol/L (ref 3.5–5.1)
Sodium: 131 mmol/L — ABNORMAL LOW (ref 135–145)

## 2022-01-24 LAB — PROTIME-INR
INR: 2 — ABNORMAL HIGH (ref 0.8–1.2)
Prothrombin Time: 22.3 seconds — ABNORMAL HIGH (ref 11.4–15.2)

## 2022-01-24 LAB — HEPARIN LEVEL (UNFRACTIONATED): Heparin Unfractionated: 0.22 IU/mL — ABNORMAL LOW (ref 0.30–0.70)

## 2022-01-24 LAB — TSH: TSH: 2.903 u[IU]/mL (ref 0.350–4.500)

## 2022-01-24 MED ORDER — SODIUM CHLORIDE 0.9 % IV SOLN: 250.0000 mL | INTRAVENOUS | Status: DC

## 2022-01-24 MED ORDER — MIDODRINE HCL 5 MG PO TABS
10.0000 mg | ORAL_TABLET | Freq: Three times a day (TID) | ORAL | Status: DC
  Administered 2022-01-24 (×3): 10 mg via ORAL
  Filled 2022-01-24 (×2): qty 2

## 2022-01-24 MED ORDER — MIDODRINE HCL 5 MG PO TABS
10.0000 mg | ORAL_TABLET | Freq: Three times a day (TID) | ORAL | Status: DC
  Filled 2022-01-24: qty 2

## 2022-01-24 MED ORDER — REVEFENACIN 175 MCG/3ML IN SOLN
175.0000 ug | Freq: Every day | RESPIRATORY_TRACT | Status: DC
  Administered 2022-01-25 – 2022-01-29 (×5): 175 ug via RESPIRATORY_TRACT
  Filled 2022-01-24 (×5): qty 3

## 2022-01-24 MED ORDER — NOREPINEPHRINE 4 MG/250ML-% IV SOLN
2.0000 ug/min | INTRAVENOUS | Status: DC
  Administered 2022-01-24: 5 ug/min via INTRAVENOUS
  Administered 2022-01-24 (×2): 2 ug/min via INTRAVENOUS
  Administered 2022-01-26: 6 ug/min via INTRAVENOUS
  Administered 2022-01-27: 8 ug/min via INTRAVENOUS
  Administered 2022-01-27: 10 ug/min via INTRAVENOUS
  Administered 2022-01-27: 12 ug/min via INTRAVENOUS
  Administered 2022-01-28: 10 ug/min via INTRAVENOUS
  Filled 2022-01-24 (×11): qty 250

## 2022-01-24 MED ORDER — SODIUM CHLORIDE 0.9 % IV SOLN: INTRAVENOUS | Status: AC

## 2022-01-24 MED ORDER — HEPARIN (PORCINE) 25000 UT/250ML-% IV SOLN
1100.0000 [IU]/h | INTRAVENOUS | Status: DC
  Administered 2022-01-24: 1000 [IU]/h via INTRAVENOUS
  Administered 2022-01-26 – 2022-01-28 (×2): 1100 [IU]/h via INTRAVENOUS
  Filled 2022-01-24 (×4): qty 250

## 2022-01-24 MED ORDER — MILRINONE LACTATE IN DEXTROSE 20-5 MG/100ML-% IV SOLN
0.1250 ug/kg/min | INTRAVENOUS | Status: DC
  Administered 2022-01-24 – 2022-01-28 (×3): 0.125 ug/kg/min via INTRAVENOUS
  Filled 2022-01-24 (×4): qty 100

## 2022-01-24 MED ORDER — SODIUM CHLORIDE 0.9 % IV BOLUS
250.0000 mL | Freq: Once | INTRAVENOUS | Status: AC
  Administered 2022-01-24: 250 mL via INTRAVENOUS

## 2022-01-24 MED ORDER — SODIUM CHLORIDE 0.9 % IV SOLN: INTRAVENOUS | Status: DC | PRN

## 2022-01-24 MED ORDER — ARFORMOTEROL TARTRATE 15 MCG/2ML IN NEBU
15.0000 ug | INHALATION_SOLUTION | Freq: Two times a day (BID) | RESPIRATORY_TRACT | Status: DC
  Administered 2022-01-24 – 2022-01-29 (×10): 15 ug via RESPIRATORY_TRACT
  Filled 2022-01-24 (×10): qty 2

## 2022-01-24 MED ORDER — SODIUM CHLORIDE 0.9 % IV BOLUS: 500.0000 mL | Freq: Once | INTRAVENOUS | Status: DC

## 2022-01-24 MED ORDER — DIGOXIN 125 MCG PO TABS
0.1250 mg | ORAL_TABLET | Freq: Once | ORAL | Status: AC
  Administered 2022-01-24: 0.125 mg via ORAL
  Filled 2022-01-24: qty 1

## 2022-01-24 MED ORDER — BUDESONIDE 0.5 MG/2ML IN SUSP
0.5000 mg | Freq: Two times a day (BID) | RESPIRATORY_TRACT | Status: DC
  Administered 2022-01-24 – 2022-01-29 (×10): 0.5 mg via RESPIRATORY_TRACT
  Filled 2022-01-24 (×10): qty 2

## 2022-01-24 NOTE — Consult Note (Signed)
NAME:  [name], MRN:  [id number], DOB:  05-06-1934, LOS: 2 ADMISSION DATE:  01/28/2022, CONSULTATION DATE:  5/19 REFERRING MD:  Dr. Aundra Dubin, CHIEF COMPLAINT:  cardiogenic shock   History of Present Illness:  88 year old female with PMH as below, which is significant for persistent atrial fibrillation on warfarin, COPD, mitral regurgitation, OSA, and non-ischemic cardiomyopathy. She presented to [facility] ED 5/18 with complaints of generalized weakness x 2-3 days. Also complained of occasional shortness of breath. She was found to be tachypneic and tachycardic. Also hypotensive to 91/75. Workup also significant for Renal failure, transaminitis, and elevated lactic acid. She was admitted to the hospitalist service with renal failure, lactic acidosis , and bilateral moderate pleural effusions. Workup included echocardiogram, which was significant for new reduction of biventricular function and severe bi-atrial dilation. Diagnosed with cardiogenic shock. Central line was placed and she was started on norepinephrine and milrinone infusions. Course complicated by acute thrombosis of L vs R (unclear) radial vein and superficial thrombophlebitis of the rt basilic vein. Started on heparin infusion. She was transferred [facility] for ICU admission and heart failure specialist evaluation.   Pertinent  Medical History   has a past medical history of Asthma, Atrial fibrillation ([place]), Chronic rhinitis, COPD (chronic obstructive pulmonary disease) ([place]), GERD (gastroesophageal reflux disease), Mitral regurgitation, Mitral valve prolapse, Nasal polyps, Nonischemic cardiomyopathy ([place]), Obstructive sleep apnea, and Transudative pleural effusion.   Significant Hospital Events: Including procedures, antibiotic start and stop dates in addition to other pertinent events   5/18 admit to [facility], Cardiogenic shock 5/19 transfer to [facility] for ICU, Heart failure eval.   Interim History / Subjective:     Objective   Blood pressure 94/62, pulse 93, temperature (!) 96.7 F (35.9 C), temperature source Rectal, resp. rate (!) 24, height '5\' 6"'$  (1.676 m), weight 62.4 kg, SpO2 92 %. CVP:  [12 mmHg-20 mmHg] 12 mmHg      Intake/Output Summary (Last 24 hours) at 01/24/2022 1659 Last data filed at 01/24/2022 1600 Gross per 24 hour  Intake 1746.88 ml  Output --  Net 1746.88 ml   Filed Weights   01/17/2022 1713 01/20/2022 2212  Weight: 63.5 kg 62.4 kg    Examination: General: Frail elderly female in NAD HENT: [place]/AT, PERRL Lungs: Diminished bases bilaterally Cardiovascular: IRIR, tachycardic, no MRG auscultated Abdomen: Soft, non-tender, non-distended Extremities: left hand cyanotic Neuro: Alert, oriented, non-focal  Resolved Hospital Problem list     Assessment & Plan:   Cardiogenic shock: secondary to biventricular heart failure. Echocardiogram 5/18 showed LVEF less than 20% as well as severely reduced RV function. Severe mitral stenosis.  - management per heart failure specialist - Coox 57 > 49 - Levophed - milrinone - MAP goal 65 - not a candidate for advanced therapies - not a candidate for mitral valve surgery - Trend Co-ox, CVP - diuresis per HF if indicated  Atrial fibrillation with RVR: persistent AF at baseline. Currently AF rate 150. Anticoagulated with warfarin chronically.  - Amiodarone - heparin per pharmacy  AKI on CKD:  Hyperkalemia  - Trend chemistry and UOP - not a candidate for HD - shock treatment as above.  - Lokelma given, dose being repeated  Bilateral pleural effusions: R>L almost certainly related to heart failure and MR.  - breathing relatively comfortably. No role for thoracentesis at this time. Should improve with HF treatment.   COPD/Asthma:  [facility]ith[facility] Sauger outpatient. Will replace with brovana/budesonide/yupelri nebs - no role for systemic steroids  Upper extremity DVT:  ultrasound imaging of the left upper read as DVT in the right radial vein.  Clinically the L hand is cyanotic so I suspect this is a misread.  - heparin - may need to follow up with radiology.   Dysphagia - slp eval  Goals of care: I was present for Dr. Jeanella Craze discussion with the patient who was very clear regarding full scope of care desires. I also spoke with her niece Danae Chen as well. Danae Chen is very realistic and understands the patient's prognosis is quite poor. She recently went through a similar experience with her own mother. We will, of course, honor the goals of care set forth by Mrs Zamor. Full code.     Best Practice (right click and "Reselect all SmartList Selections" daily)   Diet/type: Regular consistency (see orders) and NPO DVT prophylaxis: systemic heparin GI prophylaxis: PPI Lines: Central line and Arterial Line Foley:  Yes, and it is still needed Code Status:  full code Last date of multidisciplinary goals of care discussion [ 5/19 ]  Labs   CBC: Recent Labs  Lab 02/05/2022 1719 02/03/2022 1744 01/23/22 0412 01/24/22 0920  WBC 8.7 7.9 6.3 8.1  NEUTROABS  --  7.0  --   --   HGB 14.0 13.4 10.9* 12.6  HCT 42.7 41.7 34.7* 37.6  MCV 86.4 88.9 91.6 84.9  PLT 135* 127* 87* 105*    Basic Metabolic Panel: Recent Labs  Lab 01/08/2022 1719 01/21/2022 1744 01/28/2022 1938 01/23/22 0030 01/23/22 0412 01/24/22 0306  NA 133*  --   --  135 134* 133*  K 6.7* 6.7* 6.6* 5.7* 5.4* 5.5*  CL 99  --   --  105 107 102  CO2 18*  --   --  17* 12* 19*  GLUCOSE 102*  --   --  120* 82 136*  BUN 90*  --   --  84* 60* 91*  CREATININE 2.83*  --   --  2.66* 1.61* 2.66*  CALCIUM 9.4  --   --  8.5* 7.6* 8.7*  MG  --   --   --  2.5* 1.6* 3.0*  PHOS  --   --   --   --  3.6  --    GFR: Estimated Creatinine Clearance: 14.5 mL/min (A) (by C-G formula based on SCr of 2.66 mg/dL (H)). Recent Labs  Lab 01/28/2022 1719 01/09/2022 1744 02/01/2022 1931 01/23/22 0412 01/23/22 0645 01/24/22 0631 01/24/22 0920  WBC 8.7 7.9  --  6.3  --   --  8.1  LATICACIDVEN  --  2.2*    < > >9.0* 2.9* 1.5 1.5   < > = values in this interval not displayed.    Liver Function Tests: Recent Labs  Lab 02/02/2022 1719 01/23/22 0412 01/24/22 0306  AST 70* 52* 82*  ALT 75* 51* 97*  ALKPHOS 216* 149* 336*  BILITOT 2.7* 1.4* 1.5*  PROT 7.7 4.1* 6.1*  ALBUMIN 3.7 1.9* 2.8*   Recent Labs  Lab 01/12/2022 1719  LIPASE 33   No results for input(s): AMMONIA in the last 168 hours.  ABG    Component Value Date/Time   PHART 7.468 (H) 09/30/2010 0923   PCO2ART 39.9 09/30/2010 0923   PO2ART 65.0 (L) 09/30/2010 0923   HCO3 24.5 (H) 09/30/2010 0936   TCO2 26 09/30/2010 0936   ACIDBASEDEF 1.0 09/30/2010 0936   O2SAT 49.2 01/24/2022 1306     Coagulation Profile: Recent Labs  Lab 02/01/2022 1744 01/23/22 0109  01/24/22 0306  INR 1.6* 2.1* 2.0*    Cardiac Enzymes: No results for input(s): CKTOTAL, CKMB, CKMBINDEX, TROPONINI in the last 168 hours.  HbA1C: No results found for: HGBA1C  CBG: Recent Labs  Lab 01/19/2022 2004 02/03/2022 2203 01/23/22 0717 01/23/22 1155 01/23/22 1525  GLUCAP 137* 113* 112* 138* 153*    Review of Systems:   Bolds are positive  Constitutional: weight loss, gain, night sweats, Fevers, chills, fatigue .  HEENT: headaches, Sore throat, sneezing, nasal congestion, post nasal drip, Difficulty swallowing, Tooth/dental problems, visual complaints visual changes, ear ache CV:  chest pain, radiates:,Orthopnea, PND, swelling in lower extremities, dizziness, palpitations, syncope.  GI  heartburn, indigestion, abdominal pain, nausea, vomiting, diarrhea, change in bowel habits, loss of appetite, bloody stools.  Resp: cough, productive: , hemoptysis, dyspnea, chest pain, pleuritic.  Skin: rash or itching or icterus GU: dysuria, change in color of urine, urgency or frequency. flank pain, hematuria  MS: joint pain or swelling. decreased range of motion  Psych: change in mood or affect. depression or anxiety.  Neuro: difficulty with speech, weakness,  numbness, ataxia    Past Medical History:  She,  has a past medical history of Asthma, Atrial fibrillation ([place]), Chronic rhinitis, COPD (chronic obstructive pulmonary disease) ([place]), GERD (gastroesophageal reflux disease), Mitral regurgitation, Mitral valve prolapse, Nasal polyps, Nonischemic cardiomyopathy ([place]), Obstructive sleep apnea, and Transudative pleural effusion.   Surgical History:   Past Surgical History:  Procedure Laterality Date   APPENDECTOMY  2000   Bilateral cataract surgery     LAPAROSCOPIC NISSEN FUNDOPLICATION  2536   NOSE SURGERY       Social History:   reports that she quit smoking about 41 years ago. Her smoking use included cigarettes. She has a 10.00 pack-year smoking history. She has never used smokeless tobacco. She reports that she does not drink alcohol and does not use drugs.   Family History:  Her family history includes Atrial fibrillation in her sister; Colon cancer in her father and mother; Lung cancer in her brother and brother.   Allergies Allergies  Allergen Reactions   Amoxicillin-Pot Clavulanate Itching and Swelling   Clarithromycin Itching and Swelling   Clindamycin Itching and Swelling   Doxycycline Itching and Swelling   Penicillins Itching and Swelling    Did it involve swelling of the face/tongue/throat, SOB, or low BP? Yes Did it involve sudden or severe rash/hives, skin peeling, or any reaction on the inside of your mouth or nose? No Did you need to seek medical attention at a hospital or doctor's office? Yes When did it last happen? Over 10 years     If all above answers are "NO", may proceed with cephalosporin use.      Home Medications  Prior to Admission medications   Medication Sig Start Date End Date Taking? Authorizing Provider  acetaminophen (TYLENOL) 325 MG tablet Take 2 tablets (650 mg total) by mouth every 6 (six) hours as needed for mild pain, fever or headache. 05/03/19  Yes Meuth, Brooke A, PA-C  albuterol  (PROVENTIL) (2.5 MG/3ML) 0.083% nebulizer solution USE 1 VIAL IN NEBULIZER EVERY 6 HOURS AS NEEDED FOR WHEEZING OR SHORTNESS OF BREATH Patient taking differently: Take 2.5 mg by nebulization every 6 (six) hours as needed for shortness of breath or wheezing. 04/10/21  Yes [name] D, MD  albuterol (VENTOLIN HFA) 108 (90 Base) MCG/ACT inhaler TAKE 2 PUFFS BY MOUTH EVERY 6 HOURS AS NEEDED FOR WHEEZE OR SHORTNESS OF BREATH Patient taking differently: Inhale 2  puffs into the lungs every 6 (six) hours as needed for shortness of breath or wheezing. 09/05/21  Yes Young, Tarri Fuller D, MD  Budeson-Glycopyrrol-Formoterol (BREZTRI AEROSPHERE) 160-9-4.8 MCG/ACT AERO Inhale 2 puffs into the lungs in the morning and at bedtime. 05/16/20  Yes Young, Tarri Fuller D, MD  docusate sodium (COLACE) 100 MG capsule Take 100 mg by mouth 2 (two) times daily.   Yes [provider]  escitalopram (LEXAPRO) 10 MG tablet Take 1 tablet (10 mg total) by mouth every morning. 05/20/19  Yes Gerlene Fee, NP  furosemide (LASIX) 40 MG tablet Take 1 tablet (40 mg total) by mouth daily. 12/12/21  Yes Sherwood Gambler, MD  ipratropium (ATROVENT) 0.03 % nasal spray Place 2 sprays into both nostrils every 12 (twelve) hours. 03/07/21  Yes Freddi Starr, MD  phenylephrine-shark liver oil-mineral oil-petrolatum (PREPARATION H) 0.25-3-14-71.9 % rectal ointment Place 1 application rectally 2 (two) times daily as needed for hemorrhoids.   Yes [provider]  potassium chloride SA (K-DUR) 20 MEQ tablet Take 1 tablet (20 mEq total) by mouth daily. 05/20/19  Yes Gerlene Fee, NP  warfarin (COUMADIN) 5 MG tablet TAKE 1 TABLET BY MOUTH EVERY DAY OR AS DIRECTED 10/31/20  Yes Satira Sark, MD  digoxin (LANOXIN) 0.125 MG tablet TAKE 1 TABLET EVERY DAY Patient not taking: Reported on 01/13/2022 05/08/21   Satira Sark, MD  esomeprazole (NEXIUM) 40 MG capsule Take 1 capsule (40 mg total) by mouth 2 (two) times daily. 05/20/19    Gerlene Fee, NP  furosemide (LASIX) 40 MG tablet TAKE 1 TABLET BY MOUTH EVERY DAY Patient not taking: Reported on 01/11/2022 11/15/20   Satira Sark, MD  NON FORMULARY Diet- Regular    [provider]  polyethylene glycol (MIRALAX / GLYCOLAX) 17 g packet Take 17 g by mouth daily. Patient not taking: Reported on 02/02/2022 05/03/19   Margie Billet A, PA-C  predniSONE (DELTASONE) 10 MG tablet Take 4 tabs for 2 days, then 3 tabs for 2 days, 2 tabs for 2 days, then 1 tab for 2 days, then stop. Patient not taking: Reported on 02/04/2022 03/07/21   Freddi Starr, MD     Critical care time: 25 minutes     Georgann Housekeeper, AGACNP-BC [place] for personal pager PCCM on call pager [phone] until 7pm. Please call Elink 7p-7a. [phone]  01/24/2022 6:48 PM

## 2022-01-24 NOTE — IPAL (Signed)
  Interdisciplinary Goals of Care Family Meeting   Date carried out:: 01/24/2022  Location of the meeting: Bedside  Member's involved: Physician, Nurse Practitioner, and Bedside Registered Nurse  Durable Power of Attorney or acting medical decision maker: Patient herself    Discussion: We discussed goals of care for [name] .    The Clinical status was relayed to patient at bedside  in detail.   Updated and notified of patients medical condition.    Patient with increased WOB and using accessory muscles to breathe Has multiple organ dysfuction including heart failure, respiratory failure and kidney failure    Code status: Full Code  Disposition: Continue current acute care    Family are satisfied with Plan of action and management. All questions answered   Jacky Kindle MD [place] [facility] See Amion for pager If no response to pager, please call [phone] until 7pm After 7pm, Please call E-link [phone]

## 2022-01-24 NOTE — TOC Progression Note (Addendum)
Transition of Care [facility]) - Progression Note    Patient Details  Name: [name] MRN: [id number] Date of Birth: 1933-11-15  Transition of Care [facility]) CM/SW [place], [place] Phone Number: 01/24/2022, 1:56 PM  Clinical Narrative:     Update- CSW heard back from [facility] with [facility] who confirmed SNF bed for patient. CSW following to start insurance auth close to patient being medically ready for dc.  CSW spoke with patient at bedside. CSW gave patient SNF bed offers. Patient chose SNF bed with [facility]. CSW called Tami with [facility] to confirm SNF bed offer. CSW LVM. CSW awaiting callback. CSW will continue to follow and assist with patients dc planning needs.  Expected Discharge Plan: [place] Barriers to Discharge: Continued Medical Work up  Expected Discharge Plan and Services Expected Discharge Plan: [place] Choice: [place] arrangements for the past 2 months: Apartment                                       Social Determinants of Health (SDOH) Interventions    Readmission Risk Interventions    01/23/2022    3:07 PM  Readmission Risk Prevention Plan  Transportation Screening Complete  HRI or [place] Complete  Social Work Consult for [place] Planning/Counseling Complete  Palliative Care Screening Not Applicable  Medication Review Press photographer) Complete

## 2022-01-24 NOTE — ED Provider Notes (Signed)
Department of Emergency Medicine CONSULTATION    CONSULT NOTE  Chief Complaint: hypotension and no IV access    History of present illness: I was contacted by Dr. Josephine Cables, hospitalist, to evaluate patient for IV access.  Patient currently admitted to the ICU for hyperkalemia and acute kidney injury.  Patient's blood pressures are dropping.  She was given a small fluid bolus and slightly improved, then lost peripheral access.  Patient anticipated to need pressors, asked to come to the unit to place central line.  Scheduled Meds:  Chlorhexidine Gluconate Cloth  6 each Topical Daily   docusate sodium  100 mg Oral BID   feeding supplement  237 mL Oral BID BM   magnesium oxide  400 mg Oral BID   midodrine  10 mg Oral TID WC   mometasone-formoterol  2 puff Inhalation BID   multivitamin with minerals  1 tablet Oral Daily   pantoprazole  40 mg Oral Daily   polyethylene glycol  17 g Oral Daily   umeclidinium bromide  1 puff Inhalation Daily   Warfarin - Pharmacist Dosing Inpatient   Does not apply q1600   Continuous Infusions:  sodium chloride     sodium chloride 75 mL/hr at 01/24/22 0124   amiodarone 30 mg/hr (01/23/22 1705)   norepinephrine (LEVOPHED) Adult infusion     PRN Meds:.acetaminophen, albuterol, hydrocortisone, ondansetron Past Medical History:  Diagnosis Date   Asthma    Atrial fibrillation (HCC)    Chronic rhinitis    COPD (chronic obstructive pulmonary disease) (HCC)    GERD (gastroesophageal reflux disease)    Mitral regurgitation    Mitral valve prolapse    Nasal polyps    Nonischemic cardiomyopathy (HCC)    LVEF 25-30% improved to 45-50%   Obstructive sleep apnea    Transudative pleural effusion    Past Surgical History:  Procedure Laterality Date   APPENDECTOMY  2000   Bilateral cataract surgery     LAPAROSCOPIC NISSEN FUNDOPLICATION  1610   NOSE SURGERY     Social History   Socioeconomic History   Marital status: Single    Spouse name: Not on  file   Number of children: Not on file   Years of education: Not on file   Highest education level: Not on file  Occupational History   Not on file  Tobacco Use   Smoking status: Former    Packs/day: 0.50    Years: 20.00    Pack years: 10.00    Types: Cigarettes    Quit date: 10/08/1980    Years since quitting: 41.3   Smokeless tobacco: Never  Vaping Use   Vaping Use: Never used  Substance and Sexual Activity   Alcohol use: No    Alcohol/week: 0.0 standard drinks   Drug use: No   Sexual activity: Not on file  Other Topics Concern   Not on file  Social History Narrative   Former smoker, quit in 1982.  Nondrinker.  Mother and father both had colon cancer.    Social Determinants of Health   Financial Resource Strain: Not on file  Food Insecurity: Not on file  Transportation Needs: Not on file  Physical Activity: Not on file  Stress: Not on file  Social Connections: Not on file  Intimate Partner Violence: Not on file   Allergies  Allergen Reactions   Amoxicillin-Pot Clavulanate Itching and Swelling   Clarithromycin Itching and Swelling   Clindamycin Itching and Swelling   Doxycycline Itching and Swelling  Penicillins Itching and Swelling    Did it involve swelling of the face/tongue/throat, SOB, or low BP? Yes Did it involve sudden or severe rash/hives, skin peeling, or any reaction on the inside of your mouth or nose? No Did you need to seek medical attention at a hospital or doctor's office? Yes When did it last happen? Over 10 years     If all above answers are "NO", may proceed with cephalosporin use.     Last set of Vital Signs (not current) Vitals:   01/24/22 0000 01/24/22 0014  BP: (S) (!) 74/39   Pulse:    Resp: (!) 23   Temp:  97.9 F (36.6 C)  SpO2:        Physical Exam Constitutional:      Appearance: She is ill-appearing.  HENT:     Head: Normocephalic and atraumatic.  Eyes:     Pupils: Pupils are equal, round, and reactive to light.   Cardiovascular:     Rate and Rhythm: Regular rhythm. Tachycardia present.  Pulmonary:     Effort: Pulmonary effort is normal.     Breath sounds: Normal breath sounds.  Abdominal:     Palpations: Abdomen is soft.  Musculoskeletal:        General: Normal range of motion.     Cervical back: Neck supple.  Skin:    General: Skin is warm and dry.     Coloration: Skin is cyanotic (peripheral).  Neurological:     Mental Status: She is alert and oriented to person, place, and time.     Cranial Nerves: Cranial nerves 2-12 are intact.     Sensory: Sensation is intact.     Motor: Motor function is intact.     Linus Salmons Line  Date/Time: 01/24/2022 2:54 AM Performed by: Orpah Greek, MD Authorized by: Orpah Greek, MD   Consent:    Consent obtained:  Verbal   Consent given by:  Patient   Risks, benefits, and alternatives were discussed: yes     Risks discussed:  Arterial puncture, incorrect placement, bleeding, infection and pneumothorax Universal protocol:    Procedure explained and questions answered to patient or proxy's satisfaction: yes     Relevant documents present and verified: yes     Test results available: yes     Imaging studies available: yes     Required blood products, implants, devices, and special equipment available: yes     Site/side marked: yes     Immediately prior to procedure, a time out was called: yes     Patient identity confirmed:  Verbally with patient Pre-procedure details:    Indication(s): central venous access     Hand hygiene: Hand hygiene performed prior to insertion     Sterile barrier technique: All elements of maximal sterile technique followed     Skin preparation:  Chlorhexidine   Skin preparation agent: Skin preparation agent completely dried prior to procedure   Sedation:    Sedation type:  None Anesthesia:    Anesthesia method:  Local infiltration   Local anesthetic:  Lidocaine 1% w/o epi Procedure details:     Location:  R internal jugular   Patient position:  Supine   Procedural supplies:  Triple lumen   Catheter size:  7 Fr   Landmarks identified: yes     Ultrasound guidance: yes     Ultrasound guidance timing: prior to insertion     Sterile ultrasound techniques: Sterile gel and sterile probe covers were used  Number of attempts:  1   Successful placement: yes   Post-procedure details:    Post-procedure:  Dressing applied and line sutured   Assessment:  Blood return through all ports, no pneumothorax on x-ray, placement verified by x-ray and free fluid flow   Procedure completion:  Tolerated well, no immediate complications       Medical Decision making  Patient currently admitted to the ICU, has been exhibiting progressively worsening hypotension.  Patient alert and oriented but MAP much less than 65.  Will likely require cardiac pressors.  Central line placed.  Assessment and Plan  Patient with acute kidney injury, hypovolemia, hypotension.  Central line placed for IV access including administration of pressors.  Continued management by hospitalist service.   Orpah Greek, MD 01/24/22 [phone]

## 2022-01-24 NOTE — Progress Notes (Signed)
PROGRESS NOTE    [name]  WLS:[id number] DOB: 1934-05-23 DOA: 01/14/2022 PCP: Redmond School, MD    Brief Narrative:  [name] is a 88 y.o. female with past medical history of chronic atrial fibrillation, COPD, GERD presented to the hospital with generalized weakness for 2 to 3 days with decreased oral intake and increased sleepiness.  She had mild shortness of breath.  In the ED, patient was noted to be tachypneic and tachycardic with a low blood pressure at 91/75.  Mild thrombocytopenia was noted.  Hyperkalemia was noted at 6.7, creatinine was noted at 2.8 from baseline 1.4 with elevated LFTs lactic acid was elevated with mild troponin elevation.  UA was unremarkable.  INR 1.6.  Chest x-ray showed moderate right and small left pleural effusion.  In the ED, patient was given calcium gluconate, D50 IV hydration and patient was admitted to hospital for further evaluation and treatment.   Assessment and plan. Principal Problem:   Hyperkalemia Active Problems:   Atrial fibrillation (HCC)   Acute kidney injury superimposed on CKD (HCC)   Dehydration   Thrombocytopenia (HCC)   Bilateral pleural effusion   Prolonged QT interval   Lactic acidosis   Malnutrition of moderate degree   Biventricular failure (HCC)   Cardiogenic shock (HCC)    Hypotension overnight.  Likely cardiogenic shock.  No signs of infection.  Patient received central line and Levophed.    [name]  Cardiology on board and recommended transfer to [facility].  Patient.  Patient wishes to proceed with further treatment.  Severe systolic cardiomyopathy/  Cardiogenic shock with biventricular failure.  Patient needed Levophed during the night.  We will need arterial line and advanced heart failure care.  Milrinone will also be initiated as per cardiology.  Hyperkalemia Patient was 6.7 on presentation.  Patient was given  temporizing measures including calcium gluconate insulin, Lokelma, sodium bicarbonate.  Potassium has improved to 5.5 today.  We will continue to monitor closely.  Patient was taking prednisone as outpatient which she had completed and was supposed to be on Lasix which she was not taking.  Patient was taking potassium supplements at home we will continue to hold for now.  AKI on CKD 4 likely secondary to volume depletion. BUN/creatinine 19/2.83 on presentation.  (baseline creatinine at 1.4).  Creatinine of 2.6 today from initial improvement of 1.6 yesterday.  Patient persisted to have hypotension yesterday which could have contributed to mild AKI.  Positive fluid balance of 4749 mL with bilateral pleural effusion.  Dose at this time due to hypotension including Levophed  Lactic acidosis Lactic acid 2.2 > 2.5>1.5.  Likely multifactorial from volume depletion and acute kidney injury.  Improved   Chronic thrombocytopenia Platelets 127 presentation.  Down trended to 87 on 01/23/2022.  We will continue to monitor.    Elevated LFT possibly due to shock liver/dehydration AST 70, ALT 75, ALP 216 on presentation.  Slightly up trended today.  Bilateral pleural effusion More on the right compared to left.  On IV fluids due to hypotension during the nighttime.  Continue incentive spirometry flutter valve.  Patient is positive balance at this time.  Nasal cannula at 2 L/min at this time.  Chronic A-fib with RVR On warfarin at home.  Continue warfarin pharmacy to dose.  Currently on amiodarone drip due to rapid ventricular response. Digoxin on the medication list but not taking.  Review of previous  2D echocardiogram from 08/22/2020 showed LV ejection fraction of 45 to 60% with mitral regurgitation and stenosis.   2D echocardiogram at this time shows LV function less than 20% with reduced right ventricular function with severely dilated bilateral atrium, large pleural effusion, severe mitral valve stenosis  moderate MR. Cardiology on board and recommended transfer to [facility].  Patient follows up with Dr. Conni Elliot [facility] cardiology as outpatient.   Prolonged QTc (517 ms) Patient is better replenish electrolytes.  Closely monitor.  Hold Lexapro for now.   Elevated troponin possible secondary to type II demand ischemia Slightly elevated troponin.  No EKG changes or chest pain.  2D echocardiogram with reduced LV function.  Changed compared to previous echocardiogram from 2021.    Obstructive sleep apnea (not on CPAP) Nasal cannula oxygen at this time. Moderate protein calorie malnutrition.  Present on admission.  Continue Ensure supplement.  Deconditioning, debility.  Physical therapy evaluation on 01/23/2022 recommended skilled nursing facility on discharge.  Patient states that she lives with this niece at home and has a cane.  Goals of care.  Discussed with the patient patient is full code.  She wishes to proceed with further treatment as per cardiology.      DVT prophylaxis: SCDs Start: 01/28/2022 2216   Code Status:     Code Status: Full Code  Disposition: Home  Status is: Inpatient  Remains inpatient appropriate because: Hyperkalemia, AKI, IV fluids, A-fib with RVR on amiodarone drip, cardiogenic shock   Family Communication:  Spoke with the patient's niece on the phone and updated her about the clinical condition of the patient on 01/23/2022  Consultants:  Cardiology  Procedures:  Internal jugular central line placement on 01/23/2022  Antimicrobials:  None   Subjective: Today, patient was seen and examined at bedside.  Denies overt pain nausea vomiting but has dyspnea and shortness of breath.  Was very hypotensive yesterday and required central line placement with initiation of Levophed.    Objective: Vitals:   01/24/22 0900 01/24/22 0901 01/24/22 0904 01/24/22 1000  BP: (!) 109/92 (!) 109/92  94/62  Pulse: 69   93  Resp: (!) 31  (!) 24   Temp:      TempSrc:       SpO2: 97%   92%  Weight:      Height:        Intake/Output Summary (Last 24 hours) at 01/24/2022 1017 Last data filed at 01/24/2022 0944 Gross per 24 hour  Intake 1360.78 ml  Output --  Net 1360.78 ml   Filed Weights   02/04/2022 1713 01/23/2022 2212  Weight: 63.5 kg 62.4 kg    Physical Examination: Body mass index is 22.2 kg/m.   General:  Average built, not in obvious distress, on nasal cannula oxygen, elderly female, Communicative, HENT:   No scleral pallor or icterus noted. Oral mucosa is moist.  Right internal jugular central catheter in place.  Distended neck veins. Chest:    Diminished breath sounds bilaterally. No crackles or wheezes.  CVS: S1 &S2 heard.  Murmur  noted, irregularly irregular rhythm with tachycardia. Abdomen: Soft, nontender, nondistended.  Bowel sounds are heard.   Extremities: No cyanosis, clubbing or trace edema.  Peripheral pulses are palpable. Psych: Alert, awake and oriented, normal mood CNS:  No cranial nerve deficits.  Power equal in all extremities.   Skin: Warm and dry.  No rashes noted.  Data Reviewed:   CBC: Recent Labs  Lab 01/24/2022 1719 01/11/2022 1744 01/23/22 0412  WBC  8.7 7.9 6.3  NEUTROABS  --  7.0  --   HGB 14.0 13.4 10.9*  HCT 42.7 41.7 34.7*  MCV 86.4 88.9 91.6  PLT 135* 127* 87*    Basic Metabolic Panel: Recent Labs  Lab 01/25/2022 1719 01/09/2022 1744 01/19/2022 1938 01/23/22 0030 01/23/22 0412 01/24/22 0306  NA 133*  --   --  135 134* 133*  K 6.7* 6.7* 6.6* 5.7* 5.4* 5.5*  CL 99  --   --  105 107 102  CO2 18*  --   --  17* 12* 19*  GLUCOSE 102*  --   --  120* 82 136*  BUN 90*  --   --  84* 60* 91*  CREATININE 2.83*  --   --  2.66* 1.61* 2.66*  CALCIUM 9.4  --   --  8.5* 7.6* 8.7*  MG  --   --   --  2.5* 1.6* 3.0*  PHOS  --   --   --   --  3.6  --     Liver Function Tests: Recent Labs  Lab 01/19/2022 1719 01/23/22 0412 01/24/22 0306  AST 70* 52* 82*  ALT 75* 51* 97*  ALKPHOS 216* 149* 336*  BILITOT 2.7*  1.4* 1.5*  PROT 7.7 4.1* 6.1*  ALBUMIN 3.7 1.9* 2.8*     Radiology Studies: DG CHEST PORT 1 VIEW  Result Date: 01/24/2022 CLINICAL DATA:  Central venous catheter placement EXAM: PORTABLE CHEST 1 VIEW COMPARISON:  01/18/2022 FINDINGS: Right internal jugular central venous catheter tip noted at the superior cavoatrial junction. No pneumothorax. Lung volumes are small and pulmonary insufflation has diminished since prior examination. Small left and moderate right pleural effusions are present, unchanged. Mild cardiomegaly is stable. Pulmonary vascularity is normal. No acute bone abnormality. IMPRESSION: Right internal jugular central venous catheter tip at the superior cavoatrial junction. No pneumothorax. Progressive pulmonary hypoinflation. Stable bilateral pleural effusions, right greater than left. Electronically Signed   By: Fidela Salisbury M.D.   On: 01/24/2022 02:44   DG Chest Port 1 View  Result Date: 01/16/2022 CLINICAL DATA:  Sepsis and weakness. EXAM: PORTABLE CHEST 1 VIEW COMPARISON:  Chest x-ray 12/12/2021 FINDINGS: The cardiac silhouette, mediastinal and hilar contours are stable. Moderate-sized right and small left pleural effusion with overlying atelectasis. No pulmonary edema or pulmonary lesions. IMPRESSION: Moderate-sized right and small left pleural effusions with overlying atelectasis. Electronically Signed   By: Marijo Sanes M.D.   On: 01/10/2022 17:41   ECHOCARDIOGRAM COMPLETE  Result Date: 01/23/2022    ECHOCARDIOGRAM REPORT   Patient Name:   KIERSTIN JANUARY Date of Exam: 01/23/2022 Medical Rec #:  [id number]       Height:       66.0 in Accession #:    [id number]      Weight:       137.6 lb Date of Birth:  April 12, 1934        BSA:          1.706 m Patient Age:    35 years        BP:           103/68 mmHg Patient Gender: F               HR:           120 bpm. Exam Location:  [facility] Procedure: 2D Echo, Cardiac Doppler, Color Doppler and Intracardiac            Opacification Agent  Indications:  Atrial Fibrillation  History:        Patient has prior history of Echocardiogram examinations, most                 recent 08/22/2020. COPD, Arrythmias:Atrial Fibrillation; Risk                 Factors:Former Smoker.  Sonographer:    Wenda Low Referring Phys: [id number] [place]  1. Left ventricular ejection fraction, by estimation, is <20%. The left ventricle has severely decreased function. There is mild left ventricular hypertrophy.  2. Right ventricular systolic function is severely reduced. The right ventricular size is normal. There is normal pulmonary artery systolic pressure.  3. Left atrial size was severely dilated.  4. Right atrial size was severely dilated.  5. Large pleural effusion.  6. MV is thickened, calcified with severe stenosis. Peak and mean gradients through the valve and mean gradients through the valve are 22 and 10 mm Hg respectively. MVA (VTI) is 0.85 cm2. (HR does not appear accurate on monitor)     There are a couple jets of MR MR is at probably moderate.  7. The aortic valve is tricuspid. Aortic valve regurgitation is not visualized. Aortic valve sclerosis is present, with no evidence of aortic valve stenosis.  8. The inferior vena cava is dilated in size with <50% respiratory variability, suggesting right atrial pressure of 15 mmHg. FINDINGS  Left Ventricle: Left ventricular ejection fraction, by estimation, is <20%. The left ventricle has severely decreased function. Definity contrast agent was given IV to delineate the left ventricular endocardial borders. The left ventricular internal cavity size was normal in size. There is mild left ventricular hypertrophy. Right Ventricle: The right ventricular size is normal. Right vetricular wall thickness was not assessed. Right ventricular systolic function is severely reduced. There is normal pulmonary artery systolic pressure. The tricuspid regurgitant velocity is 2.58 m/s, and with an assumed right  atrial pressure of 8 mmHg, the estimated right ventricular systolic pressure is 85.2 mmHg. Left Atrium: Left atrial size was severely dilated. Right Atrium: Right atrial size was severely dilated. Pericardium: Trivial pericardial effusion is present. Mitral Valve: MV is thickened, calcified with severe stenosis. Peak and mean gradients through the valve and mean gradients through the valve are 22 and 10 mm Hg respectively. MVA (VTI) is 0.85 cm2. (HR does not appear accurate on monitor) There are a couple jets of MR MR is at probably moderate. MV peak gradient, 22.0 mmHg. The mean mitral valve gradient is 10.0 mmHg. Tricuspid Valve: The tricuspid valve is normal in structure. Tricuspid valve regurgitation is mild. Aortic Valve: The aortic valve is tricuspid. Aortic valve regurgitation is not visualized. Aortic valve sclerosis is present, with no evidence of aortic valve stenosis. Aortic valve mean gradient measures 3.3 mmHg. Aortic valve peak gradient measures 5.8  mmHg. Aortic valve area, by VTI measures 1.89 cm. Pulmonic Valve: The pulmonic valve was normal in structure. Pulmonic valve regurgitation is not visualized. Aorta: The aortic root and ascending aorta are structurally normal, with no evidence of dilitation. Venous: The inferior vena cava is dilated in size with less than 50% respiratory variability, suggesting right atrial pressure of 15 mmHg. IAS/Shunts: No atrial level shunt detected by color flow Doppler. Additional Comments: There is a large pleural effusion.  LEFT VENTRICLE PLAX 2D LVIDd:         4.40 cm LVIDs:         3.70 cm LV PW:  1.10 cm LV IVS:        1.40 cm LVOT diam:     2.00 cm LV SV:         34 LV SV Index:   20 LVOT Area:     3.14 cm  RIGHT VENTRICLE RV Basal diam:  3.80 cm RV Mid diam:    3.40 cm RV S prime:     5.98 cm/s LEFT ATRIUM              Index        RIGHT ATRIUM           Index LA diam:        6.30 cm  3.69 cm/m   RA Area:     26.50 cm LA Vol (A2C):   169.0 ml 99.08  ml/m  RA Volume:   82.80 ml  48.54 ml/m LA Vol (A4C):   143.0 ml 83.83 ml/m LA Biplane Vol: 161.0 ml 94.39 ml/m  AORTIC VALVE                    PULMONIC VALVE AV Area (Vmax):    1.69 cm     PV Vmax:       0.39 m/s AV Area (Vmean):   1.54 cm     PV Peak grad:  0.6 mmHg AV Area (VTI):     1.89 cm AV Vmax:           120.67 cm/s AV Vmean:          82.267 cm/s AV VTI:            0.183 m AV Peak Grad:      5.8 mmHg AV Mean Grad:      3.3 mmHg LVOT Vmax:         64.90 cm/s LVOT Vmean:        40.450 cm/s LVOT VTI:          0.110 m LVOT/AV VTI ratio: 0.60  AORTA Ao Root diam: 2.80 cm Ao Asc diam:  3.30 cm MITRAL VALVE                TRICUSPID VALVE MV Area (PHT): 3.72 cm     TR Peak grad:   26.6 mmHg MV Area VTI:   0.85 cm     TR Vmax:        258.00 cm/s MV Peak grad:  22.0 mmHg MV Mean grad:  10.0 mmHg    SHUNTS MV Vmax:       2.34 m/s     Systemic VTI:  0.11 m MV Vmean:      143.0 cm/s   Systemic Diam: 2.00 cm MV Decel Time: 204 msec MV E velocity: 195.00 cm/s Dorris Carnes MD Electronically signed by Dorris Carnes MD Signature Date/Time: 01/23/2022/6:25:47 PM    Final       LOS: 2 days    Flora Lipps, MD [facility] Available via Epic secure chat 7am-7pm After these hours, please refer to coverage provider listed on [email or web address] 01/24/2022, 10:17 AM

## 2022-01-24 NOTE — Progress Notes (Signed)
ANTICOAGULATION CONSULT NOTE -  Pharmacy Consult for Coumadin Indication: atrial fibrillation  Allergies  Allergen Reactions   Amoxicillin-Pot Clavulanate Itching and Swelling   Clarithromycin Itching and Swelling   Clindamycin Itching and Swelling   Doxycycline Itching and Swelling   Penicillins Itching and Swelling    Did it involve swelling of the face/tongue/throat, SOB, or low BP? Yes Did it involve sudden or severe rash/hives, skin peeling, or any reaction on the inside of your mouth or nose? No Did you need to seek medical attention at a hospital or doctor's office? Yes When did it last happen? Over 10 years     If all above answers are "NO", may proceed with cephalosporin use.     Patient Measurements: Height: '5\' 6"'$  (167.6 cm) Weight: 62.4 kg (137 lb 9.1 oz) IBW/kg (Calculated) : 59.3  Vital Signs: Temp: 96.7 F (35.9 C) (05/19 0700) Temp Source: Rectal (05/19 0700) BP: 94/62 (05/19 1000) Pulse Rate: 93 (05/19 1000)  Labs: Recent Labs    01/21/2022 1744 01/12/2022 1931 01/23/22 0030 01/23/22 0412 01/24/22 0306 01/24/22 0920  HGB 13.4  --   --  10.9*  --  12.6  HCT 41.7  --   --  34.7*  --  37.6  PLT 127*  --   --  87*  --  105*  APTT 29  --   --   --   --   --   LABPROT 18.9*  --   --  22.9* 22.3*  --   INR 1.6*  --   --  2.1* 2.0*  --   CREATININE  --   --  2.66* 1.61* 2.66*  --   TROPONINIHS 77* 69*  --   --   --   --      Estimated Creatinine Clearance: 14.5 mL/min (A) (by C-G formula based on SCr of 2.66 mg/dL (H)).   Medical History: Past Medical History:  Diagnosis Date   Asthma    Atrial fibrillation (HCC)    Chronic rhinitis    COPD (chronic obstructive pulmonary disease) (HCC)    GERD (gastroesophageal reflux disease)    Mitral regurgitation    Mitral valve prolapse    Nasal polyps    Nonischemic cardiomyopathy (HCC)    LVEF 25-30% improved to 45-50%   Obstructive sleep apnea    Transudative pleural effusion     Medications:   Medications Prior to Admission  Medication Sig Dispense Refill Last Dose   acetaminophen (TYLENOL) 325 MG tablet Take 2 tablets (650 mg total) by mouth every 6 (six) hours as needed for mild pain, fever or headache.   unknown   albuterol (PROVENTIL) (2.5 MG/3ML) 0.083% nebulizer solution USE 1 VIAL IN NEBULIZER EVERY 6 HOURS AS NEEDED FOR WHEEZING OR SHORTNESS OF BREATH (Patient taking differently: Take 2.5 mg by nebulization every 6 (six) hours as needed for shortness of breath or wheezing.) 75 mL 12 01/18/2022   albuterol (VENTOLIN HFA) 108 (90 Base) MCG/ACT inhaler TAKE 2 PUFFS BY MOUTH EVERY 6 HOURS AS NEEDED FOR WHEEZE OR SHORTNESS OF BREATH (Patient taking differently: Inhale 2 puffs into the lungs every 6 (six) hours as needed for shortness of breath or wheezing.) 6.7 each 5 01/20/2022   Budeson-Glycopyrrol-Formoterol (BREZTRI AEROSPHERE) 160-9-4.8 MCG/ACT AERO Inhale 2 puffs into the lungs in the morning and at bedtime. 10.7 g 5 01/21/2022   docusate sodium (COLACE) 100 MG capsule Take 100 mg by mouth 2 (two) times daily.   unknown   escitalopram (  LEXAPRO) 10 MG tablet Take 1 tablet (10 mg total) by mouth every morning. 30 tablet 0 01/21/2022   furosemide (LASIX) 40 MG tablet Take 1 tablet (40 mg total) by mouth daily. 30 tablet 0 01/16/2022   ipratropium (ATROVENT) 0.03 % nasal spray Place 2 sprays into both nostrils every 12 (twelve) hours. 30 mL 4 unknown   phenylephrine-shark liver oil-mineral oil-petrolatum (PREPARATION H) 0.25-3-14-71.9 % rectal ointment Place 1 application rectally 2 (two) times daily as needed for hemorrhoids.   unknown   potassium chloride SA (K-DUR) 20 MEQ tablet Take 1 tablet (20 mEq total) by mouth daily. 30 tablet 0 01/21/2022   warfarin (COUMADIN) 5 MG tablet TAKE 1 TABLET BY MOUTH EVERY DAY OR AS DIRECTED 90 tablet 3 01/21/2022   digoxin (LANOXIN) 0.125 MG tablet TAKE 1 TABLET EVERY DAY (Patient not taking: Reported on 01/11/2022) 90 tablet 1 Not Taking   esomeprazole  (NEXIUM) 40 MG capsule Take 1 capsule (40 mg total) by mouth 2 (two) times daily. 60 capsule 0    furosemide (LASIX) 40 MG tablet TAKE 1 TABLET BY MOUTH EVERY DAY (Patient not taking: Reported on 01/16/2022) 90 tablet 3 Not Taking   NON FORMULARY Diet- Regular      polyethylene glycol (MIRALAX / GLYCOLAX) 17 g packet Take 17 g by mouth daily. (Patient not taking: Reported on 01/21/2022) 14 each 0 Not Taking   predniSONE (DELTASONE) 10 MG tablet Take 4 tabs for 2 days, then 3 tabs for 2 days, 2 tabs for 2 days, then 1 tab for 2 days, then stop. (Patient not taking: Reported on 01/12/2022) 20 tablet 0 Completed Course    Assessment: 88 y.o. female with medical history significant of chronic atrial fibrillation, COPD, GERD who presents to the emergency department due to 2-3 day onset of generalized weakness. Patient with hyperkalemia and afib. Patient on chronic coumadin for afib. Home dose is '5mg'$  daily per med rec. Last anticoag visit in Nov 2022, patient taking '5mg'$  daily except 2.'5mg'$  on Sunday.  INR 2.0- now  transitioning to heparin infusion.  Goal of Therapy:  HL 0.3-0.7 INR 2-3 Monitor platelets by anticoagulation protocol: Yes   Plan:  Hold Coumadin Start heparin infusion when INR < 2.0. PT-INR daily Monitor for bleeding CBC daily  Margot Ables, PharmD Clinical Pharmacist 01/24/2022 10:57 AM

## 2022-01-24 NOTE — Progress Notes (Signed)
Report called to [facility].

## 2022-01-24 NOTE — TOC Progression Note (Signed)
Transition of Care [facility]) - Progression Note    Patient Details  Name: [name] MRN: [id number] Date of Birth: 03-26-1934  Transition of Care [facility]) CM/SW [place], [place] Phone Number: 01/24/2022, 9:47 AM  Clinical Narrative:    CSW updated that pt will be transferring to [facility]. TOC at [facility] will work on SNF placement if needed at time of D/C. TOC to follow.  Expected Discharge Plan: [place] Barriers to Discharge: Continued Medical Work up  Expected Discharge Plan and Services Expected Discharge Plan: [place] Choice: [place] arrangements for the past 2 months: Apartment                                       Social Determinants of Health (SDOH) Interventions    Readmission Risk Interventions    01/23/2022    3:07 PM  Readmission Risk Prevention Plan  Transportation Screening Complete  HRI or [place] Complete  Social Work Consult for [place] Planning/Counseling Complete  Palliative Care Screening Not Applicable  Medication Review Press photographer) Complete

## 2022-01-24 NOTE — Consult Note (Addendum)
Cardiology Consult Note:   Patient ID: [name] MRN: [id number]; DOB: 1934/01/15   Admission date: 02/02/2022  PCP:  Redmond School, MD   [facility] Providers Cardiologist:  [name], MD        Chief Complaint:  Cardiogenic Shock (Biventricular heart failure) in the setting of permanent atrial fibrillation   Patient Profile:   [name] is a 88 y.o. female with permanent atrial fibrillation, moderate severe mitral regurgitation, COPD, OSA on CPAP, and CKD stage IIIa  who is being seen 01/24/2022 for the evaluation of cardiogenic shock.  History of Present Illness:   [name] has done OK for several years.  Patient has niece who lives with her.  She watches TV and does back ADLs.  She is largely sedentary.  Was doing ok in 2022 with LV recovery on low dose AV nodal agent and coumadin.  Patient notes that was feeling better from her 12/12/21 ED visit (SOB and transient AF RVR.  Improved with IV lasix; found to have subtherapeutic INR.  Creatinine ~1.6.  Patient notes that she was feeling poorly for the past three days Prior to admission.  Notes no  no chest pain, chest pressure, chest tightness, chest stinging .   Notes  DOE and shortness of breath,.  No PND or orthopnea.  No weight gain, leg swelling , or abdominal swelling.  No syncope or near syncope . Notes  no palpitations or funny heart beats (despite RVR).  Felt tired and weak.  Found to have lactate of 2.5 Given IV hydration. Though course has had persistent hypotension Decrease UOP New LVEF and RV function and severely decreases; Severe MS Severe bi-atrial dilation. Had IJ [employer] placed. Has had minimal urine output. Tele; AF RVR no VT (artifact)  Presently, patient has no symptoms.  She denies CP, DOE, palpitations.  When pressed she has mild SOB.  Her goal of care is to be back at home    Past Medical History:  Diagnosis Date   Asthma    Atrial fibrillation (HCC)    Chronic rhinitis     COPD (chronic obstructive pulmonary disease) (HCC)    GERD (gastroesophageal reflux disease)    Mitral regurgitation    Mitral valve prolapse    Nasal polyps    Nonischemic cardiomyopathy (HCC)    LVEF 25-30% improved to 45-50%   Obstructive sleep apnea    Transudative pleural effusion     Past Surgical History:  Procedure Laterality Date   APPENDECTOMY  2000   Bilateral cataract surgery     LAPAROSCOPIC NISSEN FUNDOPLICATION  6384   NOSE SURGERY       Medications Prior to Admission: Prior to Admission medications   Medication Sig Start Date End Date Taking? Authorizing Provider  acetaminophen (TYLENOL) 325 MG tablet Take 2 tablets (650 mg total) by mouth every 6 (six) hours as needed for mild pain, fever or headache. 05/03/19  Yes Meuth, Brooke A, PA-C  albuterol (PROVENTIL) (2.5 MG/3ML) 0.083% nebulizer solution USE 1 VIAL IN NEBULIZER EVERY 6 HOURS AS NEEDED FOR WHEEZING OR SHORTNESS OF BREATH Patient taking differently: Take 2.5 mg by nebulization every 6 (six) hours as needed for shortness of breath or wheezing. 04/10/21  Yes Young, Tarri Fuller D, MD  albuterol (VENTOLIN HFA) 108 (90 Base) MCG/ACT inhaler TAKE 2 PUFFS BY MOUTH EVERY 6 HOURS AS NEEDED FOR WHEEZE OR SHORTNESS OF BREATH Patient taking differently: Inhale 2 puffs into the lungs every 6 (six) hours as needed for  shortness of breath or wheezing. 09/05/21  Yes Young, Tarri Fuller D, MD  Budeson-Glycopyrrol-Formoterol (BREZTRI AEROSPHERE) 160-9-4.8 MCG/ACT AERO Inhale 2 puffs into the lungs in the morning and at bedtime. 05/16/20  Yes Young, Tarri Fuller D, MD  docusate sodium (COLACE) 100 MG capsule Take 100 mg by mouth 2 (two) times daily.   Yes [provider]  escitalopram (LEXAPRO) 10 MG tablet Take 1 tablet (10 mg total) by mouth every morning. 05/20/19  Yes Gerlene Fee, NP  furosemide (LASIX) 40 MG tablet Take 1 tablet (40 mg total) by mouth daily. 12/12/21  Yes Sherwood Gambler, MD  ipratropium (ATROVENT) 0.03 % nasal  spray Place 2 sprays into both nostrils every 12 (twelve) hours. 03/07/21  Yes Freddi Starr, MD  phenylephrine-shark liver oil-mineral oil-petrolatum (PREPARATION H) 0.25-3-14-71.9 % rectal ointment Place 1 application rectally 2 (two) times daily as needed for hemorrhoids.   Yes [provider]  potassium chloride SA (K-DUR) 20 MEQ tablet Take 1 tablet (20 mEq total) by mouth daily. 05/20/19  Yes Gerlene Fee, NP  warfarin (COUMADIN) 5 MG tablet TAKE 1 TABLET BY MOUTH EVERY DAY OR AS DIRECTED 10/31/20  Yes Satira Sark, MD  digoxin (LANOXIN) 0.125 MG tablet TAKE 1 TABLET EVERY DAY Patient not taking: Reported on 01/15/2022 05/08/21   Satira Sark, MD  esomeprazole (NEXIUM) 40 MG capsule Take 1 capsule (40 mg total) by mouth 2 (two) times daily. 05/20/19   Gerlene Fee, NP  furosemide (LASIX) 40 MG tablet TAKE 1 TABLET BY MOUTH EVERY DAY Patient not taking: Reported on 01/21/2022 11/15/20   Satira Sark, MD  NON FORMULARY Diet- Regular    [provider]  polyethylene glycol (MIRALAX / GLYCOLAX) 17 g packet Take 17 g by mouth daily. Patient not taking: Reported on 01/09/2022 05/03/19   Margie Billet A, PA-C  predniSONE (DELTASONE) 10 MG tablet Take 4 tabs for 2 days, then 3 tabs for 2 days, 2 tabs for 2 days, then 1 tab for 2 days, then stop. Patient not taking: Reported on 01/24/2022 03/07/21   Freddi Starr, MD     Allergies:    Allergies  Allergen Reactions   Amoxicillin-Pot Clavulanate Itching and Swelling   Clarithromycin Itching and Swelling   Clindamycin Itching and Swelling   Doxycycline Itching and Swelling   Penicillins Itching and Swelling    Did it involve swelling of the face/tongue/throat, SOB, or low BP? Yes Did it involve sudden or severe rash/hives, skin peeling, or any reaction on the inside of your mouth or nose? No Did you need to seek medical attention at a hospital or doctor's office? Yes When did it last happen? Over 10  years     If all above answers are "NO", may proceed with cephalosporin use.     Social History:   Social History   Socioeconomic History   Marital status: Single    Spouse name: Not on file   Number of children: Not on file   Years of education: Not on file   Highest education level: Not on file  Occupational History   Not on file  Tobacco Use   Smoking status: Former    Packs/day: 0.50    Years: 20.00    Pack years: 10.00    Types: Cigarettes    Quit date: 10/08/1980    Years since quitting: 41.3   Smokeless tobacco: Never  Vaping Use   Vaping Use: Never used  Substance and Sexual Activity  Alcohol use: No    Alcohol/week: 0.0 standard drinks   Drug use: No   Sexual activity: Not on file  Other Topics Concern   Not on file  Social History Narrative   Former smoker, quit in 1982.  Nondrinker.  Mother and father both had colon cancer.    Social Determinants of Health   Financial Resource Strain: Not on file  Food Insecurity: Not on file  Transportation Needs: Not on file  Physical Activity: Not on file  Stress: Not on file  Social Connections: Not on file  Intimate Partner Violence: Not on file    Family History:   The patient's family history includes Atrial fibrillation in her sister; Colon cancer in her father and mother; Lung cancer in her brother and brother.    ROS:  Please see the history of present illness.  All other ROS reviewed and negative.     Physical Exam/Data:   Vitals:   01/24/22 0800 01/24/22 0805 01/24/22 0839 01/24/22 0901  BP: (!) 111/93 (!) 111/93 (!) 85/58 (!) 109/92  Pulse: (!) 48 (!) 123    Resp: (!) 27 20    Temp:      TempSrc:      SpO2: 93% 100%    Weight:      Height:        Intake/Output Summary (Last 24 hours) at 01/24/2022 0910 Last data filed at 01/24/2022 0807 Gross per 24 hour  Intake 1352.89 ml  Output --  Net 1352.89 ml      01/27/2022   10:12 PM 02/05/2022    5:13 PM 12/12/2021    8:25 AM  Last 3 Weights   Weight (lbs) 137 lb 9.1 oz 140 lb 133 lb 15.9 oz  Weight (kg) 62.4 kg 63.504 kg 60.78 kg     Body mass index is 22.2 kg/m.   Gen: Thin elderly cachetic female mild distress Neck: Elevated JVD Cardiac: IRIR tachycardia with diastolic rumble Respiratory: Coarse breath sounds bilateral  GI: Soft, nontender, non-distended  MS: Non pitting edema;  moves all extremities Integument: Skin feels well Neuro:  At time of evaluation, alert and oriented to person/place/time/situation  Psych: Normal affect, patient feels ok  Laboratory Data:  High Sensitivity Troponin:   Recent Labs  Lab 01/13/2022 1744 01/08/2022 1931  TROPONINIHS 77* 69*      Chemistry Recent Labs  Lab 01/23/22 0412 01/24/22 0306  NA 134* 133*  K 5.4* 5.5*  CL 107 102  CO2 12* 19*  GLUCOSE 82 136*  BUN 60* 91*  CREATININE 1.61* 2.66*  CALCIUM 7.6* 8.7*  MG 1.6* 3.0*  GFRNONAA 31* 17*  ANIONGAP 15 12    Recent Labs  Lab 01/23/22 0412 01/24/22 0306  PROT 4.1* 6.1*  ALBUMIN 1.9* 2.8*  AST 52* 82*  ALT 51* 97*  ALKPHOS 149* 336*  BILITOT 1.4* 1.5*   Lipids No results for input(s): CHOL, TRIG, HDL, LABVLDL, LDLCALC, CHOLHDL in the last 168 hours. Hematology Recent Labs  Lab 02/05/2022 1744 01/23/22 0412  WBC 7.9 6.3  RBC 4.69 3.79*  HGB 13.4 10.9*  HCT 41.7 34.7*  MCV 88.9 91.6  MCH 28.6 28.8  MCHC 32.1 31.4  RDW 17.4* 17.3*  PLT 127* 87*   Thyroid  Recent Labs  Lab 01/24/22 0306  TSH 2.903   BNPNo results for input(s): BNP, PROBNP in the last 168 hours.  DDimer No results for input(s): DDIMER in the last 168 hours.   Radiology/Studies:  DG CHEST PORT 1  VIEW  Result Date: 01/24/2022 CLINICAL DATA:  Central venous catheter placement EXAM: PORTABLE CHEST 1 VIEW COMPARISON:  01/16/2022 FINDINGS: Right internal jugular central venous catheter tip noted at the superior cavoatrial junction. No pneumothorax. Lung volumes are small and pulmonary insufflation has diminished since prior examination.  Small left and moderate right pleural effusions are present, unchanged. Mild cardiomegaly is stable. Pulmonary vascularity is normal. No acute bone abnormality. IMPRESSION: Right internal jugular central venous catheter tip at the superior cavoatrial junction. No pneumothorax. Progressive pulmonary hypoinflation. Stable bilateral pleural effusions, right greater than left. Electronically Signed   By: Fidela Salisbury M.D.   On: 01/24/2022 02:44   ECHOCARDIOGRAM COMPLETE  Result Date: 01/23/2022    ECHOCARDIOGRAM REPORT   Patient Name:   LAHNA NATH Date of Exam: 01/23/2022 Medical Rec #:  [id number]       Height:       66.0 in Accession #:    [id number]      Weight:       137.6 lb Date of Birth:  1933-10-11        BSA:          1.706 m Patient Age:    62 years        BP:           103/68 mmHg Patient Gender: F               HR:           120 bpm. Exam Location:  [facility] Procedure: 2D Echo, Cardiac Doppler, Color Doppler and Intracardiac            Opacification Agent Indications:    Atrial Fibrillation  History:        Patient has prior history of Echocardiogram examinations, most                 recent 08/22/2020. COPD, Arrythmias:Atrial Fibrillation; Risk                 Factors:Former Smoker.  Sonographer:    Wenda Low Referring Phys: [id number] [place]  1. Left ventricular ejection fraction, by estimation, is <20%. The left ventricle has severely decreased function. There is mild left ventricular hypertrophy.  2. Right ventricular systolic function is severely reduced. The right ventricular size is normal. There is normal pulmonary artery systolic pressure.  3. Left atrial size was severely dilated.  4. Right atrial size was severely dilated.  5. Large pleural effusion.  6. MV is thickened, calcified with severe stenosis. Peak and mean gradients through the valve and mean gradients through the valve are 22 and 10 mm Hg respectively. MVA (VTI) is 0.85 cm2. (HR does not appear accurate  on monitor)     There are a couple jets of MR MR is at probably moderate.  7. The aortic valve is tricuspid. Aortic valve regurgitation is not visualized. Aortic valve sclerosis is present, with no evidence of aortic valve stenosis.  8. The inferior vena cava is dilated in size with <50% respiratory variability, suggesting right atrial pressure of 15 mmHg. FINDINGS  Left Ventricle: Left ventricular ejection fraction, by estimation, is <20%. The left ventricle has severely decreased function. Definity contrast agent was given IV to delineate the left ventricular endocardial borders. The left ventricular internal cavity size was normal in size. There is mild left ventricular hypertrophy. Right Ventricle: The right ventricular size is normal. Right vetricular wall thickness was not assessed. Right ventricular systolic function is severely  reduced. There is normal pulmonary artery systolic pressure. The tricuspid regurgitant velocity is 2.58 m/s, and with an assumed right atrial pressure of 8 mmHg, the estimated right ventricular systolic pressure is 38.1 mmHg. Left Atrium: Left atrial size was severely dilated. Right Atrium: Right atrial size was severely dilated. Pericardium: Trivial pericardial effusion is present. Mitral Valve: MV is thickened, calcified with severe stenosis. Peak and mean gradients through the valve and mean gradients through the valve are 22 and 10 mm Hg respectively. MVA (VTI) is 0.85 cm2. (HR does not appear accurate on monitor) There are a couple jets of MR MR is at probably moderate. MV peak gradient, 22.0 mmHg. The mean mitral valve gradient is 10.0 mmHg. Tricuspid Valve: The tricuspid valve is normal in structure. Tricuspid valve regurgitation is mild. Aortic Valve: The aortic valve is tricuspid. Aortic valve regurgitation is not visualized. Aortic valve sclerosis is present, with no evidence of aortic valve stenosis. Aortic valve mean gradient measures 3.3 mmHg. Aortic valve peak gradient  measures 5.8  mmHg. Aortic valve area, by VTI measures 1.89 cm. Pulmonic Valve: The pulmonic valve was normal in structure. Pulmonic valve regurgitation is not visualized. Aorta: The aortic root and ascending aorta are structurally normal, with no evidence of dilitation. Venous: The inferior vena cava is dilated in size with less than 50% respiratory variability, suggesting right atrial pressure of 15 mmHg. IAS/Shunts: No atrial level shunt detected by color flow Doppler. Additional Comments: There is a large pleural effusion.  LEFT VENTRICLE PLAX 2D LVIDd:         4.40 cm LVIDs:         3.70 cm LV PW:         1.10 cm LV IVS:        1.40 cm LVOT diam:     2.00 cm LV SV:         34 LV SV Index:   20 LVOT Area:     3.14 cm  RIGHT VENTRICLE RV Basal diam:  3.80 cm RV Mid diam:    3.40 cm RV S prime:     5.98 cm/s LEFT ATRIUM              Index        RIGHT ATRIUM           Index LA diam:        6.30 cm  3.69 cm/m   RA Area:     26.50 cm LA Vol (A2C):   169.0 ml 99.08 ml/m  RA Volume:   82.80 ml  48.54 ml/m LA Vol (A4C):   143.0 ml 83.83 ml/m LA Biplane Vol: 161.0 ml 94.39 ml/m  AORTIC VALVE                    PULMONIC VALVE AV Area (Vmax):    1.69 cm     PV Vmax:       0.39 m/s AV Area (Vmean):   1.54 cm     PV Peak grad:  0.6 mmHg AV Area (VTI):     1.89 cm AV Vmax:           120.67 cm/s AV Vmean:          82.267 cm/s AV VTI:            0.183 m AV Peak Grad:      5.8 mmHg AV Mean Grad:      3.3 mmHg LVOT Vmax:  64.90 cm/s LVOT Vmean:        40.450 cm/s LVOT VTI:          0.110 m LVOT/AV VTI ratio: 0.60  AORTA Ao Root diam: 2.80 cm Ao Asc diam:  3.30 cm MITRAL VALVE                TRICUSPID VALVE MV Area (PHT): 3.72 cm     TR Peak grad:   26.6 mmHg MV Area VTI:   0.85 cm     TR Vmax:        258.00 cm/s MV Peak grad:  22.0 mmHg MV Mean grad:  10.0 mmHg    SHUNTS MV Vmax:       2.34 m/s     Systemic VTI:  0.11 m MV Vmean:      143.0 cm/s   Systemic Diam: 2.00 cm MV Decel Time: 204 msec MV E velocity:  195.00 cm/s Dorris Carnes MD Electronically signed by Dorris Carnes MD Signature Date/Time: 01/23/2022/6:25:47 PM    Final      Assessment and Plan:   Cardiogenic Shock Biventricular failure AKI related to CKD stage IIIa COPD Protein Caloric Malnutrition and Frailty Permanent AF RVR Moderate-severe MS - CVP Zeroed and transduced: 19 mm Hg - needs arterial line, send venous Co-oximetry - continue levophed - starting milrinone 0.125 mg will likely need higher to decongest - once we have A line will start lasix 80 IV X1 to start - given dig X1 - continue amiodarone  We have discussed goal of care with patient: presently full code; her goal is to be back home and continue to live her fairly sedentary life.  Reasonable for transfer to [facility].  Discussed in brief with Dr. Aundra Dubin.  Discussed with [facility] team  Called Niece and patient and I left a message (she works nights and is likely asleep).  Transfer to [facility]   Has left arm Dvt.  INR to 2.  When appropriate will switch to heparin.      Risk Assessment/Risk Scores:      New York Heart Association (NYHA) Functional Class NYHA Class IV  CHA2DS2-VASc Score = 4   This indicates a 4.8% annual risk of stroke. The patient's score is based upon: CHF History: 1 HTN History: 0 Diabetes History: 0 Stroke History: 0 Vascular Disease History: 0 Age Score: 2 Gender Score: 1      CRITICAL CARE Performed by: Junell Cullifer A Leianne Callins  Total critical care time: 70 minutes. Critical care time was exclusive of separately billable procedures and treating other patients. Critical care was necessary to treat or prevent imminent or life-threatening deterioration. Critical care was time spent personally by me on the following activities: development of treatment plan with patient and/or surrogate as well as nursing, discussions with consultants, evaluation of patient's response to treatment, examination of patient, obtaining history from patient or  surrogate, ordering and performing treatments and interventions, ordering and review of laboratory studies, ordering and review of radiographic studies, pulse oximetry and re-evaluation of patient's condition.    Signed, Rudean Haskell, [place]  01/24/2022 9:10 AM    For questions or updates, please contact [place] Please consult [email or web address] for contact info under     Signed, Werner Lean, MD  01/24/2022 9:10 AM

## 2022-01-24 NOTE — Progress Notes (Signed)
Persistent hypotension over the course of the last hour.  no urinary output . Administered '10mg'$  of midodrine and 250cc bolus as ordered . BP unchanged, discussed with MD.

## 2022-01-24 NOTE — Progress Notes (Signed)
01/23/2022, 8 PM.  RN called due to having difficulty in being able to palpate pulse and cyanotic arm.  At bedside, patient was noted to have low blood pressure (79/52-MAP 61), hands were cold.  Patient was however, in no acute distress.  Radial pulse was palpable and after warming patient's hands, circulation to hands improved.  RN was asked to provide warm blanket for patient.  Left arm was noted to be slightly swollen.  Left upper extremity ultrasound will be done in the morning to rule out DVT.   01/23/2022 11:40 PM RN called due to patient having persistent hypotension.  IV NS 250 cc bolus, and 10 mg of midodrine was ordered with only slight improvement in patient's BP.  Unfortunately, she lost her peripheral IV.  ED physician (Dr. Betsey Holiday) was asked to help in placing central line-right IJ was placed.  Patient was started on Levophed, gentle IV hydration was also provided to maintain a MAP of at least 65.  Patient does not appear to be septic clinically.  We will continue to monitor patient and treat accordingly.  Patient was in no acute distress.  Total time:  25 minutes This includes time reviewing the chart including progress notes, labs, EKGs, taking medical decisions, ordering labs and documenting findings.

## 2022-01-24 NOTE — Procedures (Signed)
Arterial Catheter Insertion Procedure Note  [name]  [id number]  1934/05/27  Date:01/24/22  Time:5:17 PM    Provider Performing: Jacky Kindle    Procedure: Insertion of Arterial Line [phone]) with US guidance ([id number])   Indication(s) Blood pressure monitoring and/or need for frequent ABGs  Consent Risks of the procedure as well as the alternatives and risks of each were explained to the patient and/or caregiver.  Consent for the procedure was obtained and is signed in the bedside chart  Anesthesia None   Time Out Verified patient identification, verified procedure, site/side was marked, verified correct patient position, special equipment/implants available, medications/allergies/relevant history reviewed, required imaging and test results available.   Sterile Technique Maximal sterile technique including full sterile barrier drape, hand hygiene, sterile gown, sterile gloves, mask, hair covering, sterile ultrasound probe cover (if used).   Procedure Description Area of catheter insertion was cleaned with chlorhexidine and draped in sterile fashion. With real-time ultrasound guidance an arterial catheter was placed into the right  Axillary  artery.  Appropriate arterial tracings confirmed on monitor.     Complications/Tolerance None; patient tolerated the procedure well.   EBL Minimal   Specimen(s) None

## 2022-01-24 NOTE — Consult Note (Addendum)
Advanced Heart Failure Team Consult Note   Primary Physician: Redmond School, MD PCP-Cardiologist:  [name], MD  Reason for Consultation: acute biventricular heart failure w/ cardiogenic shock   HPI:    [name] is seen today for evaluation of acute biventricular heart failure w/ cardiogenic shock at the request of Dr. Gasper Sells, Cardiology.   88 y/o female w/ h/o tobacco abuse, COPD, chronic afib, mitral stenosis and HF. Had echo in 2016 that showed reduced EF 30-35% but recovered on subsequent echos. Per chart review, has never had cardiac cath.   Her most recent echo, prior to this admit, was in 08/2020 which showed normal LVEF 55-60% w/ severe LVH, severe LAE, mod RAE and mod-severe MS w/ Gradient 10.1 mmHg.   Followed by Dr. Domenic Polite. Last seen 6/22.  Presented to [facility] yesterday w/ complaints of fatigue/ weakness, increased lethargy and decreased urinary output. Found to be in shock w/ hypotension and severe lactic acidosis w/ lactic acid >9.0. CO2 17, SCr 2.8 (BL 1.4). BUN 90, AST 82, ALT 97, Alk phos 336, Tbili 1.5. K 6.7. Was in rapid afib. Hs trop 77>>69.  Also found to have acute occlusive thrombus in rt radial vein and superficial thrombophlebitis of the rt basilic vein.   Started on NE + amio gtt + IV Lasix.   Echo showed severe biventricular failure + mitral stenosis. LVEF <20%, RV severely reduced,  severe mitral stenosis w/ mGradient 10 mmHg, MVA (VTI) is 0.85 cm2, at least mod MR and severe BAE. RVSP 34 mmHg. IVC Dilated.   Central line was placed. Initial co-ox 58%. Milrinone added to NE. Transferred to [facility] for further care.   On arrival, she is A&O. On 4L [place]. No respiratory distress. NE 5+ Milrinone 0.125. Amio gtt ato 30/hr + heparin gtt. CVP 10-11. Repeat co-ox 49%. Lactate cleared, > 9.0>>2.9>>1.5. Lt hand cyanotic.   Remain in rapid Afib 140s. Awaiting A-line placement.   Currently Full code. No family present at bedside. Lives w/ her  niece, who works 3rd shift. She will come in later this afternoon.    2D Echo 01/23/22 Left ventricular ejection fraction, by estimation, is <20%. The left ventricle has severely decreased function. There is mild left ventricular hypertrophy. 1. Right ventricular systolic function is severely reduced. The right ventricular size is normal. There is normal pulmonary artery systolic pressure. 2. 3. Left atrial size was severely dilated. 4. Right atrial size was severely dilated. 5. Large pleural effusion. MV is thickened, calcified with severe stenosis. Peak and mean gradients through the valve and mean gradients through the valve are 22 and 10 mm Hg respectively. MVA (VTI) is 0.85 cm2. (HR does not appear accurate on monitor) There are a couple jets of MR MR is at probably moderate. 6. The aortic valve is tricuspid. Aortic valve regurgitation is not visualized. Aortic valve sclerosis is present, with no evidence of aortic valve stenosis. 7. The inferior vena cava is dilated in size with <50% respiratory variability, suggesting right atrial pressure of 15 mmHg.  Review of Systems: [y] = yes, [ ]  = no   General: Weight gain [ ] ; Weight loss [ ] ; Anorexia [ ] ; Fatigue [ Y]; Fever [ ] ; Chills [ ] ; Weakness [Y ]  Cardiac: Chest pain/pressure [ ] ; Resting SOB [ ] ; Exertional SOB [ ] ; Orthopnea [ ] ; Pedal Edema [ ] ; Palpitations [ ] ; Syncope [ ] ; Presyncope [ ] ; Paroxysmal nocturnal dyspnea[ ]   Pulmonary: Cough [ ] ; Wheezing[ ] ; Hemoptysis[ ] ;  Sputum [ ] ; Snoring [ ]   GI: Vomiting[ ] ; Dysphagia[ ] ; Melena[ ] ; Hematochezia [ ] ; Heartburn[ ] ; Abdominal pain [ ] ; Constipation [ ] ; Diarrhea [ ] ; BRBPR [ ]   GU: Hematuria[ ] ; Dysuria [ ] ; Nocturia[ ]   Vascular: Pain in legs with walking [ ] ; Pain in feet with lying flat [ ] ; Non-healing sores [ ] ; Stroke [ ] ; TIA [ ] ; Slurred speech [ ] ;  Neuro: Headaches[ ] ; Vertigo[ ] ; Seizures[ ] ; Paresthesias[ ] ;Blurred vision [ ] ; Diplopia [ ] ; Vision changes [  ]  Ortho/Skin: Arthritis [ Y]; Joint pain [ ] ; Muscle pain [ Y]; Joint swelling [ ] ; Back Pain [ ] ; Rash [ ]   Psych: Depression[ ] ; Anxiety[ ]   Heme: Bleeding problems [ ] ; Clotting disorders [ ] ; Anemia [ ]   Endocrine: Diabetes [ ] ; Thyroid dysfunction[ ]   Home Medications Prior to Admission medications   Medication Sig Start Date End Date Taking? Authorizing Provider  acetaminophen (TYLENOL) 325 MG tablet Take 2 tablets (650 mg total) by mouth every 6 (six) hours as needed for mild pain, fever or headache. 05/03/19  Yes Meuth, Brooke A, PA-C  albuterol (PROVENTIL) (2.5 MG/3ML) 0.083% nebulizer solution USE 1 VIAL IN NEBULIZER EVERY 6 HOURS AS NEEDED FOR WHEEZING OR SHORTNESS OF BREATH Patient taking differently: Take 2.5 mg by nebulization every 6 (six) hours as needed for shortness of breath or wheezing. 04/10/21  Yes Young, Tarri Fuller D, MD  albuterol (VENTOLIN HFA) 108 (90 Base) MCG/ACT inhaler TAKE 2 PUFFS BY MOUTH EVERY 6 HOURS AS NEEDED FOR WHEEZE OR SHORTNESS OF BREATH Patient taking differently: Inhale 2 puffs into the lungs every 6 (six) hours as needed for shortness of breath or wheezing. 09/05/21  Yes Young, Tarri Fuller D, MD  Budeson-Glycopyrrol-Formoterol (BREZTRI AEROSPHERE) 160-9-4.8 MCG/ACT AERO Inhale 2 puffs into the lungs in the morning and at bedtime. 05/16/20  Yes Young, Tarri Fuller D, MD  docusate sodium (COLACE) 100 MG capsule Take 100 mg by mouth 2 (two) times daily.   Yes [provider]  escitalopram (LEXAPRO) 10 MG tablet Take 1 tablet (10 mg total) by mouth every morning. 05/20/19  Yes Gerlene Fee, NP  furosemide (LASIX) 40 MG tablet Take 1 tablet (40 mg total) by mouth daily. 12/12/21  Yes Sherwood Gambler, MD  ipratropium (ATROVENT) 0.03 % nasal spray Place 2 sprays into both nostrils every 12 (twelve) hours. 03/07/21  Yes Freddi Starr, MD  phenylephrine-shark liver oil-mineral oil-petrolatum (PREPARATION H) 0.25-3-14-71.9 % rectal ointment Place 1 application  rectally 2 (two) times daily as needed for hemorrhoids.   Yes [provider]  potassium chloride SA (K-DUR) 20 MEQ tablet Take 1 tablet (20 mEq total) by mouth daily. 05/20/19  Yes Gerlene Fee, NP  warfarin (COUMADIN) 5 MG tablet TAKE 1 TABLET BY MOUTH EVERY DAY OR AS DIRECTED 10/31/20  Yes Satira Sark, MD  digoxin (LANOXIN) 0.125 MG tablet TAKE 1 TABLET EVERY DAY Patient not taking: Reported on 02/04/2022 05/08/21   Satira Sark, MD  esomeprazole (NEXIUM) 40 MG capsule Take 1 capsule (40 mg total) by mouth 2 (two) times daily. 05/20/19   Gerlene Fee, NP  furosemide (LASIX) 40 MG tablet TAKE 1 TABLET BY MOUTH EVERY DAY Patient not taking: Reported on 01/31/2022 11/15/20   Satira Sark, MD  NON FORMULARY Diet- Regular    [provider]  polyethylene glycol (MIRALAX / GLYCOLAX) 17 g packet Take 17 g by mouth daily. Patient not taking: Reported on 01/07/2022 05/03/19  Meuth, Brooke A, PA-C  predniSONE (DELTASONE) 10 MG tablet Take 4 tabs for 2 days, then 3 tabs for 2 days, 2 tabs for 2 days, then 1 tab for 2 days, then stop. Patient not taking: Reported on 01/12/2022 03/07/21   Freddi Starr, MD    Past Medical History: Past Medical History:  Diagnosis Date   Asthma    Atrial fibrillation (HCC)    Chronic rhinitis    COPD (chronic obstructive pulmonary disease) (HCC)    GERD (gastroesophageal reflux disease)    Mitral regurgitation    Mitral valve prolapse    Nasal polyps    Nonischemic cardiomyopathy (HCC)    LVEF 25-30% improved to 45-50%   Obstructive sleep apnea    Transudative pleural effusion     Past Surgical History: Past Surgical History:  Procedure Laterality Date   APPENDECTOMY  2000   Bilateral cataract surgery     LAPAROSCOPIC NISSEN FUNDOPLICATION  0388   NOSE SURGERY      Family History: Family History  Problem Relation Age of Onset   Colon cancer Mother    Colon cancer Father    Atrial fibrillation Sister    Lung  cancer Brother    Lung cancer Brother     Social History: Social History   Socioeconomic History   Marital status: Single    Spouse name: Not on file   Number of children: Not on file   Years of education: Not on file   Highest education level: Not on file  Occupational History   Not on file  Tobacco Use   Smoking status: Former    Packs/day: 0.50    Years: 20.00    Pack years: 10.00    Types: Cigarettes    Quit date: 10/08/1980    Years since quitting: 41.3   Smokeless tobacco: Never  Vaping Use   Vaping Use: Never used  Substance and Sexual Activity   Alcohol use: No    Alcohol/week: 0.0 standard drinks   Drug use: No   Sexual activity: Not on file  Other Topics Concern   Not on file  Social History Narrative   Former smoker, quit in 1982.  Nondrinker.  Mother and father both had colon cancer.    Social Determinants of Health   Financial Resource Strain: Not on file  Food Insecurity: Not on file  Transportation Needs: Not on file  Physical Activity: Not on file  Stress: Not on file  Social Connections: Not on file    Allergies:  Allergies  Allergen Reactions   Amoxicillin-Pot Clavulanate Itching and Swelling   Clarithromycin Itching and Swelling   Clindamycin Itching and Swelling   Doxycycline Itching and Swelling   Penicillins Itching and Swelling    Did it involve swelling of the face/tongue/throat, SOB, or low BP? Yes Did it involve sudden or severe rash/hives, skin peeling, or any reaction on the inside of your mouth or nose? No Did you need to seek medical attention at a hospital or doctor's office? Yes When did it last happen? Over 10 years     If all above answers are "NO", may proceed with cephalosporin use.     Objective:    Vital Signs:   Temp:  [96 F (35.6 C)-98.4 F (36.9 C)] 96.7 F (35.9 C) (05/19 0700) Pulse Rate:  [31-138] 93 (05/19 1000) Resp:  [20-37] 24 (05/19 0904) BP: (69-122)/(37-98) 94/62 (05/19 1000) SpO2:  [90 %-100 %]  92 % (05/19 1000) Last BM Date :  (  Prior to admit, pt does not know when)  Weight change: Filed Weights   01/19/2022 1713 01/18/2022 2212  Weight: 63.5 kg 62.4 kg    Intake/Output:   Intake/Output Summary (Last 24 hours) at 01/24/2022 1416 Last data filed at 01/24/2022 0944 Gross per 24 hour  Intake 1360.78 ml  Output --  Net 1360.78 ml      Physical Exam    General:  elderly, chronically ill/fatigued appearing. No resp difficulty HEENT: normal Neck: supple. JVP elevated to jaw. + Rt IJ CVC, Carotids 2+ bilat; no bruits. No lymphadenopathy or thyromegaly appreciated. Cor: PMI nondisplaced. Irregularly irregularly rhythm, tachy rate. 2/6 MS murmur  Lungs: clear Abdomen: soft, nontender, nondistended. No hepatosplenomegaly. No bruits or masses. Good bowel sounds. Extremities: no cyanosis, clubbing, rash, 1+ bl LE, left hand cyanotic  Neuro: alert & orientedx3, cranial nerves grossly intact. moves all 4 extremities w/o difficulty. Affect pleasant   Telemetry   Afib 130s-140s   EKG    Afib 151 bpm   Labs   Basic Metabolic Panel: Recent Labs  Lab 01/11/2022 1719 01/08/2022 1744 01/24/2022 1938 01/23/22 0030 01/23/22 0412 01/24/22 0306  NA 133*  --   --  135 134* 133*  K 6.7* 6.7* 6.6* 5.7* 5.4* 5.5*  CL 99  --   --  105 107 102  CO2 18*  --   --  17* 12* 19*  GLUCOSE 102*  --   --  120* 82 136*  BUN 90*  --   --  84* 60* 91*  CREATININE 2.83*  --   --  2.66* 1.61* 2.66*  CALCIUM 9.4  --   --  8.5* 7.6* 8.7*  MG  --   --   --  2.5* 1.6* 3.0*  PHOS  --   --   --   --  3.6  --     Liver Function Tests: Recent Labs  Lab 01/23/2022 1719 01/23/22 0412 01/24/22 0306  AST 70* 52* 82*  ALT 75* 51* 97*  ALKPHOS 216* 149* 336*  BILITOT 2.7* 1.4* 1.5*  PROT 7.7 4.1* 6.1*  ALBUMIN 3.7 1.9* 2.8*   Recent Labs  Lab 01/28/2022 1719  LIPASE 33   No results for input(s): AMMONIA in the last 168 hours.  CBC: Recent Labs  Lab 01/18/2022 1719 01/21/2022 1744 01/23/22 0412  01/24/22 0920  WBC 8.7 7.9 6.3 8.1  NEUTROABS  --  7.0  --   --   HGB 14.0 13.4 10.9* 12.6  HCT 42.7 41.7 34.7* 37.6  MCV 86.4 88.9 91.6 84.9  PLT 135* 127* 87* 105*    Cardiac Enzymes: No results for input(s): CKTOTAL, CKMB, CKMBINDEX, TROPONINI in the last 168 hours.  BNP: BNP (last 3 results) Recent Labs    12/12/21 0837  BNP 573.0*    ProBNP (last 3 results) No results for input(s): PROBNP in the last 8760 hours.   CBG: Recent Labs  Lab 01/09/2022 2004 02/04/2022 2203 01/23/22 0717 01/23/22 1155 01/23/22 1525  GLUCAP 137* 113* 112* 138* 153*    Coagulation Studies: Recent Labs    01/28/2022 1744 01/23/22 0412 01/24/22 0306  LABPROT 18.9* 22.9* 22.3*  INR 1.6* 2.1* 2.0*     Imaging   US Venous Img Upper Uni Left (DVT)  Result Date: 01/24/2022 CLINICAL DATA:  88 year old female with left upper extremity pain and swelling. EXAM: LEFT UPPER EXTREMITY VENOUS DOPPLER ULTRASOUND TECHNIQUE: Gray-scale sonography with graded compression, as well as color Doppler and duplex ultrasound were performed  to evaluate the upper extremity deep venous system from the level of the subclavian vein and including the jugular, axillary, basilic, radial, ulnar and upper cephalic vein. Spectral Doppler was utilized to evaluate flow at rest and with distal augmentation maneuvers. COMPARISON:  None Available. FINDINGS: Contralateral Subclavian Vein: Respiratory phasicity is normal and symmetric with the symptomatic side. No evidence of thrombus. Normal compressibility. Internal Jugular Vein: No evidence of thrombus. Normal compressibility, respiratory phasicity and response to augmentation. Subclavian Vein: No evidence of thrombus. Normal compressibility, respiratory phasicity and response to augmentation. Axillary Vein: No evidence of thrombus. Normal compressibility, respiratory phasicity and response to augmentation. Cephalic Vein: No evidence of thrombus. Normal compressibility, respiratory  phasicity and response to augmentation. Basilic Vein: Occlusive, hypoechoic, and mildly expansile thrombus throughout with absent compressibility. Brachial Veins: No evidence of thrombus. Normal compressibility, respiratory phasicity and response to augmentation. Radial Veins: Occlusive, hypoechoic, and mildly expansile thrombus throughout with absent compressibility. Ulnar Veins: No evidence of thrombus. Normal compressibility, respiratory phasicity and response to augmentation. Other Findings:  None visualized. IMPRESSION: 1. Acute, occlusive thrombus in the right radial vein. 2. Superficial thrombophlebitis of the right basilic vein. No evidence of surrounding suppurative changes. Ruthann Cancer, MD [facility] Electronically Signed   By: Ruthann Cancer M.D.   On: 01/24/2022 12:22   DG CHEST PORT 1 VIEW  Result Date: 01/24/2022 CLINICAL DATA:  Central venous catheter placement EXAM: PORTABLE CHEST 1 VIEW COMPARISON:  02/04/2022 FINDINGS: Right internal jugular central venous catheter tip noted at the superior cavoatrial junction. No pneumothorax. Lung volumes are small and pulmonary insufflation has diminished since prior examination. Small left and moderate right pleural effusions are present, unchanged. Mild cardiomegaly is stable. Pulmonary vascularity is normal. No acute bone abnormality. IMPRESSION: Right internal jugular central venous catheter tip at the superior cavoatrial junction. No pneumothorax. Progressive pulmonary hypoinflation. Stable bilateral pleural effusions, right greater than left. Electronically Signed   By: Fidela Salisbury M.D.   On: 01/24/2022 02:44   ECHOCARDIOGRAM COMPLETE  Result Date: 01/23/2022    ECHOCARDIOGRAM REPORT   Patient Name:   EDUARDA SCRIVENS Date of Exam: 01/23/2022 Medical Rec #:  [id number]       Height:       66.0 in Accession #:    [id number]      Weight:       137.6 lb Date of Birth:  [birth year]        BSA:           1.706 m Patient Age:    89 years        BP:           103/68 mmHg Patient Gender: F               HR:           120 bpm. Exam Location:  [facility] Procedure: 2D Echo, Cardiac Doppler, Color Doppler and Intracardiac            Opacification Agent Indications:    Atrial Fibrillation  History:        Patient has prior history of Echocardiogram examinations, most                 recent 08/22/2020. COPD, Arrythmias:Atrial Fibrillation; Risk                 Factors:Former Smoker.  Sonographer:    Wenda Low Referring Phys: [id number] [place]  1. Left ventricular ejection  fraction, by estimation, is <20%. The left ventricle has severely decreased function. There is mild left ventricular hypertrophy.  2. Right ventricular systolic function is severely reduced. The right ventricular size is normal. There is normal pulmonary artery systolic pressure.  3. Left atrial size was severely dilated.  4. Right atrial size was severely dilated.  5. Large pleural effusion.  6. MV is thickened, calcified with severe stenosis. Peak and mean gradients through the valve and mean gradients through the valve are 22 and 10 mm Hg respectively. MVA (VTI) is 0.85 cm2. (HR does not appear accurate on monitor)     There are a couple jets of MR MR is at probably moderate.  7. The aortic valve is tricuspid. Aortic valve regurgitation is not visualized. Aortic valve sclerosis is present, with no evidence of aortic valve stenosis.  8. The inferior vena cava is dilated in size with <50% respiratory variability, suggesting right atrial pressure of 15 mmHg. FINDINGS  Left Ventricle: Left ventricular ejection fraction, by estimation, is <20%. The left ventricle has severely decreased function. Definity contrast agent was given IV to delineate the left ventricular endocardial borders. The left ventricular internal cavity size was normal in size. There is mild left ventricular hypertrophy. Right Ventricle: The right ventricular  size is normal. Right vetricular wall thickness was not assessed. Right ventricular systolic function is severely reduced. There is normal pulmonary artery systolic pressure. The tricuspid regurgitant velocity is 2.58 m/s, and with an assumed right atrial pressure of 8 mmHg, the estimated right ventricular systolic pressure is 38.4 mmHg. Left Atrium: Left atrial size was severely dilated. Right Atrium: Right atrial size was severely dilated. Pericardium: Trivial pericardial effusion is present. Mitral Valve: MV is thickened, calcified with severe stenosis. Peak and mean gradients through the valve and mean gradients through the valve are 22 and 10 mm Hg respectively. MVA (VTI) is 0.85 cm2. (HR does not appear accurate on monitor) There are a couple jets of MR MR is at probably moderate. MV peak gradient, 22.0 mmHg. The mean mitral valve gradient is 10.0 mmHg. Tricuspid Valve: The tricuspid valve is normal in structure. Tricuspid valve regurgitation is mild. Aortic Valve: The aortic valve is tricuspid. Aortic valve regurgitation is not visualized. Aortic valve sclerosis is present, with no evidence of aortic valve stenosis. Aortic valve mean gradient measures 3.3 mmHg. Aortic valve peak gradient measures 5.8  mmHg. Aortic valve area, by VTI measures 1.89 cm. Pulmonic Valve: The pulmonic valve was normal in structure. Pulmonic valve regurgitation is not visualized. Aorta: The aortic root and ascending aorta are structurally normal, with no evidence of dilitation. Venous: The inferior vena cava is dilated in size with less than 50% respiratory variability, suggesting right atrial pressure of 15 mmHg. IAS/Shunts: No atrial level shunt detected by color flow Doppler. Additional Comments: There is a large pleural effusion.  LEFT VENTRICLE PLAX 2D LVIDd:         4.40 cm LVIDs:         3.70 cm LV PW:         1.10 cm LV IVS:        1.40 cm LVOT diam:     2.00 cm LV SV:         34 LV SV Index:   20 LVOT Area:     3.14 cm   RIGHT VENTRICLE RV Basal diam:  3.80 cm RV Mid diam:    3.40 cm RV S prime:     5.98 cm/s LEFT ATRIUM  Index        RIGHT ATRIUM           Index LA diam:        6.30 cm  3.69 cm/m   RA Area:     26.50 cm LA Vol (A2C):   169.0 ml 99.08 ml/m  RA Volume:   82.80 ml  48.54 ml/m LA Vol (A4C):   143.0 ml 83.83 ml/m LA Biplane Vol: 161.0 ml 94.39 ml/m  AORTIC VALVE                    PULMONIC VALVE AV Area (Vmax):    1.69 cm     PV Vmax:       0.39 m/s AV Area (Vmean):   1.54 cm     PV Peak grad:  0.6 mmHg AV Area (VTI):     1.89 cm AV Vmax:           120.67 cm/s AV Vmean:          82.267 cm/s AV VTI:            0.183 m AV Peak Grad:      5.8 mmHg AV Mean Grad:      3.3 mmHg LVOT Vmax:         64.90 cm/s LVOT Vmean:        40.450 cm/s LVOT VTI:          0.110 m LVOT/AV VTI ratio: 0.60  AORTA Ao Root diam: 2.80 cm Ao Asc diam:  3.30 cm MITRAL VALVE                TRICUSPID VALVE MV Area (PHT): 3.72 cm     TR Peak grad:   26.6 mmHg MV Area VTI:   0.85 cm     TR Vmax:        258.00 cm/s MV Peak grad:  22.0 mmHg MV Mean grad:  10.0 mmHg    SHUNTS MV Vmax:       2.34 m/s     Systemic VTI:  0.11 m MV Vmean:      143.0 cm/s   Systemic Diam: 2.00 cm MV Decel Time: 204 msec MV E velocity: 195.00 cm/s Dorris Carnes MD Electronically signed by Dorris Carnes MD Signature Date/Time: 01/23/2022/6:25:47 PM    Final      Medications:     Current Medications:  Chlorhexidine Gluconate Cloth  6 each Topical Daily   docusate sodium  100 mg Oral BID   feeding supplement  237 mL Oral BID BM   magnesium oxide  400 mg Oral BID   midodrine  10 mg Oral TID WC   mometasone-formoterol  2 puff Inhalation BID   multivitamin with minerals  1 tablet Oral Daily   pantoprazole  40 mg Oral Daily   polyethylene glycol  17 g Oral Daily   umeclidinium bromide  1 puff Inhalation Daily    Infusions:  sodium chloride Stopped (01/24/22 0434)   sodium chloride     amiodarone 30 mg/hr (01/24/22 0434)   heparin 1,000 Units/hr  (01/24/22 1337)   milrinone 0.125 mcg/kg/min (01/24/22 1001)   norepinephrine (LEVOPHED) Adult infusion 2 mcg/min (01/24/22 0944)      Patient Profile   88 y/o female w/ chronic afib, severe mitral stenosis, tobacco use and COPD admitted w/ acute biventricular heart failure w/ severe cardiogenic shock/lactic acidosis, rapid Afib, acute Rt UE DVT and AKI.   Assessment/Plan   Acute Biventricular Heart  Failure w/ Cardiogenic Shock - Echo 2016, EF 30-35% but recovered on subsequent echos - Echo 2021 EF 55-60%, RV normal - Admitted w/ BiV failure w/ cardiogenic shock and severe lactic acidosis, initial LA > 9.0  - Echo EF < 20%, RV severely reduced, severe MS - HS trop 77>>69  - Suspect NICM, likely tachy mediated + valvular heart disease  - Currently on Milrinone 0.125 + NE 5 . Repeat Co-ox 49%. LA has cleared, 1.5 on repeat  - no GDMT given AKI and hypotension  - not a candidate for advanced therapies given age, co morbidities, fragility and poor functional status at baseline  - currently full Code. Will involve palliative care team for [place] discussion when family arrives. Don't see good end-point for her. Should strongly consider transition to comfort care   2. Severe Mitral Stenosis  - long standing MS - severe on Echo, mGradient 10 mmHg. MVA (VTI) is 0.85 cm2 - not candidate for surgery given age and co morbidities   3. Atrial Fibrillation w/ RVR - chronic Afib at baseline - in setting of long standing mitral valve stenosis, severe LAE - amio gtt for rate control - heparin gtt for now, eventual transition back to coumadin   4. Acute Hypoxic Respiratory Failure - in setting of acute CHF - h/o smoking and COPD at baseline - currently stable on 4L [place]  - currently full CODE. Will d/w pt CODE status, consideration of DNI   5. Rt UE DVT - suspect likely cardioembolic - afib + severely reduced LV - continue heparin gtt  - if recovers, back to coumadin for valvular afib   6.  AKI on CKD Stage  - previous BL SCr ~1.4 - 2.8 on admit, in setting of shock/hypotension  - NE + milrinone support per above  - would not be candidate for HD given age and severe RV failure   7. Hyperkalemia - initial K 6.7, in setting of acute renal failure  - correcting w/ lokelma>>5.5 on repeat - repeat lokelma x 1   8. Shock Liver - inotropic/pressor support per above    Length of Stay: 2  Brittainy Simmons, PA-C  01/24/2022, 2:16 PM  Advanced Heart Failure Team Pager [phone] (M-F; 7a - 5p)  Please contact [facility] for night-coverage after hours (4p -7a ) and weekends on [email or web address]   Patient seen with PA, agree with the above note.   Patient presented to [facility] in cardiogenic shock with elevated lactate and hypotension.  She was stabilized at [facility] and transferred here.   She has permanent atrial fibrillation, HR currently very high 140s-150s.  Amiodarone gtt just increased to 60 mg/hr.  She is on milrinone 0.125 with NE 5, SBP 90s-100s currently.  Creatinine up to 2.66.  Not making urine.  CVP 8, co-ox 58% but repeated 49%.   General: NAD, frail Neck: No JVD, no thyromegaly or thyroid nodule.  Lungs: Clear to auscultation bilaterally with normal respiratory effort. CV: Nondisplaced PMI.  Heart tachy, irregular S1/S2, no S3/S4, 1/6 SEM RUSB.  1+ edema 1/2 to knees.  No carotid bruit.  Difficult to palpate pedal pulses.  Abdomen: Soft, nontender, no hepatosplenomegaly, no distention.  Skin: Intact without lesions or rashes.  Neurologic: Alert and oriented x 3.  Psych: Normal affect. Extremities: Left had discolored. Cool extremities.  HEENT: Normal.   1. Cardiogenic shock: Echo this admission with EF < 20%, severely decreased RV function with normal RV size, severe biatrial enlargement, severe MS with mean gradient  10 and MVA 0.85 cm^2, moderate MR, IVC dilated.  Cause of cardiomyopathy uncertain, suspect nonischemic.  Initially hypotensive with lactate > 9,  lactate now down to 1.5.  Co-ox 58% => 49%.  She is on NE 5, milrinone 0.125 with CVP 8.  - Continue current milrinone 0.125 and NE.  Need arterial line, will consult CCM.  - With CVP 8, avoid aggressive diuresis at this time.  - Not candidate for advanced therapies with age and frailty.  2. AKI on CKD stage 3: Creatinine up to 2.66, suspect due to shock/cardiorenal.  No UOP here.   - I do not think she would be a candidate for RRT.  3. Atrial fibrillation: Permanent.  Now with RVR 130s-150s.   - Increase amiodarone gtt to 60 mg/hr.  4. RUE DVT: She is on heparin gtt.  5. COPD: H/o smoking. On 4L [place].  6. Mitral stenosis: Severe.  This has been known for several years.  Severely calcified MV.  Not candidate for valve surgery.  7. Hyperkalemia: Had Lokelma, repeat BMET.   Very poor prognosis with elderly, frail patient with multisystem organ failure.  I discussed code status with the patient and her niece, her niece is coming in to talk with her.  She will be full code for now.  I will also consult palliative care.  Think DNR/DNI appropriate, if she does not turn around rapidly would consider transition to comfort care.   Loralie Champagne 01/24/2022 4:46 PM

## 2022-01-24 NOTE — Progress Notes (Signed)
ANTICOAGULATION CONSULT NOTE -  Pharmacy Consult for heparin Indication: atrial fibrillation  Allergies  Allergen Reactions   Amoxicillin-Pot Clavulanate Itching and Swelling   Clarithromycin Itching and Swelling   Clindamycin Itching and Swelling   Doxycycline Itching and Swelling   Penicillins Itching and Swelling    Did it involve swelling of the face/tongue/throat, SOB, or low BP? Yes Did it involve sudden or severe rash/hives, skin peeling, or any reaction on the inside of your mouth or nose? No Did you need to seek medical attention at a hospital or doctor's office? Yes When did it last happen? Over 10 years     If all above answers are "NO", may proceed with cephalosporin use.     Patient Measurements: Height: '5\' 6"'$  (167.6 cm) Weight: 62.4 kg (137 lb 9.1 oz) IBW/kg (Calculated) : 59.3  Vital Signs: Temp: 96.7 F (35.9 C) (05/19 0700) Temp Source: Rectal (05/19 0700) BP: 94/62 (05/19 1000) Pulse Rate: 93 (05/19 1000)  Labs: Recent Labs    01/24/2022 1744 01/20/2022 1931 01/23/22 0030 01/23/22 0412 01/24/22 0306 01/24/22 0920  HGB 13.4  --   --  10.9*  --  12.6  HCT 41.7  --   --  34.7*  --  37.6  PLT 127*  --   --  87*  --  105*  APTT 29  --   --   --   --   --   LABPROT 18.9*  --   --  22.9* 22.3*  --   INR 1.6*  --   --  2.1* 2.0*  --   CREATININE  --   --  2.66* 1.61* 2.66*  --   TROPONINIHS 77* 69*  --   --   --   --      Estimated Creatinine Clearance: 14.5 mL/min (A) (by C-G formula based on SCr of 2.66 mg/dL (H)).   Medical History: Past Medical History:  Diagnosis Date   Asthma    Atrial fibrillation (HCC)    Chronic rhinitis    COPD (chronic obstructive pulmonary disease) (HCC)    GERD (gastroesophageal reflux disease)    Mitral regurgitation    Mitral valve prolapse    Nasal polyps    Nonischemic cardiomyopathy (HCC)    LVEF 25-30% improved to 45-50%   Obstructive sleep apnea    Transudative pleural effusion     Medications:   Medications Prior to Admission  Medication Sig Dispense Refill Last Dose   acetaminophen (TYLENOL) 325 MG tablet Take 2 tablets (650 mg total) by mouth every 6 (six) hours as needed for mild pain, fever or headache.   unknown   albuterol (PROVENTIL) (2.5 MG/3ML) 0.083% nebulizer solution USE 1 VIAL IN NEBULIZER EVERY 6 HOURS AS NEEDED FOR WHEEZING OR SHORTNESS OF BREATH (Patient taking differently: Take 2.5 mg by nebulization every 6 (six) hours as needed for shortness of breath or wheezing.) 75 mL 12 01/20/2022   albuterol (VENTOLIN HFA) 108 (90 Base) MCG/ACT inhaler TAKE 2 PUFFS BY MOUTH EVERY 6 HOURS AS NEEDED FOR WHEEZE OR SHORTNESS OF BREATH (Patient taking differently: Inhale 2 puffs into the lungs every 6 (six) hours as needed for shortness of breath or wheezing.) 6.7 each 5 01/30/2022   Budeson-Glycopyrrol-Formoterol (BREZTRI AEROSPHERE) 160-9-4.8 MCG/ACT AERO Inhale 2 puffs into the lungs in the morning and at bedtime. 10.7 g 5 01/21/2022   docusate sodium (COLACE) 100 MG capsule Take 100 mg by mouth 2 (two) times daily.   unknown   escitalopram (  LEXAPRO) 10 MG tablet Take 1 tablet (10 mg total) by mouth every morning. 30 tablet 0 01/21/2022   furosemide (LASIX) 40 MG tablet Take 1 tablet (40 mg total) by mouth daily. 30 tablet 0 01/27/2022   ipratropium (ATROVENT) 0.03 % nasal spray Place 2 sprays into both nostrils every 12 (twelve) hours. 30 mL 4 unknown   phenylephrine-shark liver oil-mineral oil-petrolatum (PREPARATION H) 0.25-3-14-71.9 % rectal ointment Place 1 application rectally 2 (two) times daily as needed for hemorrhoids.   unknown   potassium chloride SA (K-DUR) 20 MEQ tablet Take 1 tablet (20 mEq total) by mouth daily. 30 tablet 0 01/21/2022   warfarin (COUMADIN) 5 MG tablet TAKE 1 TABLET BY MOUTH EVERY DAY OR AS DIRECTED 90 tablet 3 01/21/2022   digoxin (LANOXIN) 0.125 MG tablet TAKE 1 TABLET EVERY DAY (Patient not taking: Reported on 01/26/2022) 90 tablet 1 Not Taking   esomeprazole  (NEXIUM) 40 MG capsule Take 1 capsule (40 mg total) by mouth 2 (two) times daily. 60 capsule 0    furosemide (LASIX) 40 MG tablet TAKE 1 TABLET BY MOUTH EVERY DAY (Patient not taking: Reported on 01/17/2022) 90 tablet 3 Not Taking   NON FORMULARY Diet- Regular      polyethylene glycol (MIRALAX / GLYCOLAX) 17 g packet Take 17 g by mouth daily. (Patient not taking: Reported on 01/19/2022) 14 each 0 Not Taking   predniSONE (DELTASONE) 10 MG tablet Take 4 tabs for 2 days, then 3 tabs for 2 days, 2 tabs for 2 days, then 1 tab for 2 days, then stop. (Patient not taking: Reported on 01/17/2022) 20 tablet 0 Completed Course    Assessment: 88 y.o. female with medical history significant of chronic atrial fibrillation, COPD, GERD who presents to the emergency department due to 2-3 day onset of generalized weakness. Patient with hyperkalemia and afib. Patient on chronic coumadin for afib. Home dose is '5mg'$  daily per med rec. Last anticoag visit in Nov 2022, patient taking '5mg'$  daily except 2.'5mg'$  on Sunday.  Pharmacy asked to start IV heparin once INR down. INR today is 2.0 and dopplers reveal acute radial thrombus so will go ahead and initiate heparin infusion with no bolus.  Goal of Therapy:  HL 0.3-0.7 units/ml Monitor platelets by anticoagulation protocol: Yes   Plan:  Hold Coumadin Heparin 1000 units/h Check heparin level in 8h  Arrie Senate, PharmD, [place], [facility] Clinical Pharmacist [phone] Please check AMION for all [place] numbers 01/24/2022

## 2022-01-25 DIAGNOSIS — I4891 Unspecified atrial fibrillation: Secondary | ICD-10-CM | POA: Diagnosis not present

## 2022-01-25 DIAGNOSIS — N179 Acute kidney failure, unspecified: Secondary | ICD-10-CM | POA: Diagnosis not present

## 2022-01-25 DIAGNOSIS — Z7189 Other specified counseling: Secondary | ICD-10-CM

## 2022-01-25 DIAGNOSIS — Z515 Encounter for palliative care: Secondary | ICD-10-CM

## 2022-01-25 DIAGNOSIS — R570 Cardiogenic shock: Secondary | ICD-10-CM | POA: Diagnosis not present

## 2022-01-25 LAB — COMPREHENSIVE METABOLIC PANEL
ALT: 93 U/L — ABNORMAL HIGH (ref 0–44)
AST: 65 U/L — ABNORMAL HIGH (ref 15–41)
Albumin: 2.3 g/dL — ABNORMAL LOW (ref 3.5–5.0)
Alkaline Phosphatase: 341 U/L — ABNORMAL HIGH (ref 38–126)
Anion gap: 11 (ref 5–15)
BUN: 88 mg/dL — ABNORMAL HIGH (ref 8–23)
CO2: 18 mmol/L — ABNORMAL LOW (ref 22–32)
Calcium: 8.3 mg/dL — ABNORMAL LOW (ref 8.9–10.3)
Chloride: 101 mmol/L (ref 98–111)
Creatinine, Ser: 2.52 mg/dL — ABNORMAL HIGH (ref 0.44–1.00)
GFR, Estimated: 18 mL/min — ABNORMAL LOW (ref >60)
Glucose, Bld: 145 mg/dL — ABNORMAL HIGH (ref 70–99)
Potassium: 4.7 mmol/L (ref 3.5–5.1)
Sodium: 130 mmol/L — ABNORMAL LOW (ref 135–145)
Total Bilirubin: 1 mg/dL (ref 0.3–1.2)
Total Protein: 5.4 g/dL — ABNORMAL LOW (ref 6.5–8.1)

## 2022-01-25 LAB — CBC
HCT: 36.2 % (ref 36.0–46.0)
Hemoglobin: 12.5 g/dL (ref 12.0–15.0)
MCH: 28.4 pg (ref 26.0–34.0)
MCHC: 34.5 g/dL (ref 30.0–36.0)
MCV: 82.3 fL (ref 80.0–100.0)
Platelets: 104 10*3/uL — ABNORMAL LOW (ref 150–400)
RBC: 4.4 MIL/uL (ref 3.87–5.11)
RDW: 16.4 % — ABNORMAL HIGH (ref 11.5–15.5)
WBC: 8.1 10*3/uL (ref 4.0–10.5)
nRBC: 3.7 % — ABNORMAL HIGH (ref 0.0–0.2)

## 2022-01-25 LAB — COOXEMETRY PANEL
Carboxyhemoglobin: 1.1 % (ref 0.5–1.5)
Methemoglobin: <0.7 % (ref 0.0–1.5)
O2 Saturation: 60.4 %
Total hemoglobin: 12.4 g/dL (ref 12.0–16.0)

## 2022-01-25 LAB — PROTIME-INR
INR: 2.8 — ABNORMAL HIGH (ref 0.8–1.2)
Prothrombin Time: 29.3 seconds — ABNORMAL HIGH (ref 11.4–15.2)

## 2022-01-25 LAB — MAGNESIUM: Magnesium: 2.8 mg/dL — ABNORMAL HIGH (ref 1.7–2.4)

## 2022-01-25 LAB — PHOSPHORUS: Phosphorus: 4.5 mg/dL (ref 2.5–4.6)

## 2022-01-25 LAB — HEPARIN LEVEL (UNFRACTIONATED): Heparin Unfractionated: 0.26 IU/mL — ABNORMAL LOW (ref 0.30–0.70)

## 2022-01-25 MED ORDER — FUROSEMIDE 10 MG/ML IJ SOLN
80.0000 mg | Freq: Once | INTRAMUSCULAR | Status: AC
Start: 2022-01-25 — End: 2022-01-25
  Administered 2022-01-25: 80 mg via INTRAVENOUS
  Filled 2022-01-25: qty 8

## 2022-01-25 NOTE — Progress Notes (Signed)
ANTICOAGULATION CONSULT NOTE  Pharmacy Consult for heparin Indication: atrial fibrillation  Allergies  Allergen Reactions   Amoxicillin-Pot Clavulanate Itching and Swelling   Clarithromycin Itching and Swelling   Clindamycin Itching and Swelling   Doxycycline Itching and Swelling   Penicillins Itching and Swelling    Did it involve swelling of the face/tongue/throat, SOB, or low BP? Yes Did it involve sudden or severe rash/hives, skin peeling, or any reaction on the inside of your mouth or nose? No Did you need to seek medical attention at a hospital or doctor's office? Yes When did it last happen? Over 10 years     If all above answers are "NO", may proceed with cephalosporin use.     Patient Measurements: Height: '5\' 6"'$  (167.6 cm) Weight: 62.4 kg (137 lb 9.1 oz) IBW/kg (Calculated) : 59.3  Vital Signs: Temp: 97.5 F (36.4 C) (05/19 1952) Temp Source: Axillary (05/19 1952) BP: 96/64 (05/19 1952) Pulse Rate: 141 (05/19 2300)  Labs: Recent Labs    02/05/2022 1744 01/30/2022 1931 01/23/22 0030 01/23/22 0412 01/24/22 0306 01/24/22 0920 01/24/22 1740 01/24/22 2201  HGB 13.4  --   --  10.9*  --  12.6  --   --   HCT 41.7  --   --  34.7*  --  37.6  --   --   PLT 127*  --   --  87*  --  105*  --   --   APTT 29  --   --   --   --   --   --   --   LABPROT 18.9*  --   --  22.9* 22.3*  --   --   --   INR 1.6*  --   --  2.1* 2.0*  --   --   --   HEPARINUNFRC  --   --   --   --   --   --   --  0.22*  CREATININE  --   --    < > 1.61* 2.66*  --  2.61*  --   TROPONINIHS 77* 69*  --   --   --   --   --   --    < > = values in this interval not displayed.    Estimated Creatinine Clearance: 14.8 mL/min (A) (by C-G formula based on SCr of 2.61 mg/dL (H)).   Medical History: Past Medical History:  Diagnosis Date   Asthma    Atrial fibrillation (HCC)    Chronic rhinitis    COPD (chronic obstructive pulmonary disease) (HCC)    GERD (gastroesophageal reflux disease)    Mitral  regurgitation    Mitral valve prolapse    Nasal polyps    Nonischemic cardiomyopathy (HCC)    LVEF 25-30% improved to 45-50%   Obstructive sleep apnea    Transudative pleural effusion    Assessment: 88 y.o. female with medical history significant of chronic atrial fibrillation, COPD, GERD who presents to the emergency department due to 2-3 day onset of generalized weakness. Patient with hyperkalemia and afib. Patient on chronic coumadin for afib. Home dose is '5mg'$  daily per med rec. Last anticoag visit in Nov 2022, patient taking '5mg'$  daily except 2.'5mg'$  on Sunday.  Pharmacy asked to start IV heparin once INR down. INR 5/19 is 2.0 and dopplers reveal acute radial thrombus so will go ahead and initiate heparin infusion with no bolus.  3rd: Heparin level subtherapeutic at 0.22 on heparin 1000 units/hr. No issues  with heparin infusion. No signs bleeding reported.  Goal of Therapy:  HL 0.3-0.7 units/ml Monitor platelets by anticoagulation protocol: Yes   Plan:  Increase heparin infusion to 1100 units/hr Check heparin level in 8 hours and daily while on heparin Continue to monitor H&H and platelets    Thank you for allowing pharmacy to be a part of this patient's care.  Ardyth Harps, PharmD Clinical Pharmacist

## 2022-01-25 NOTE — Consult Note (Signed)
Palliative Care Consult Note                                  Date: 01/25/2022   Patient Name: [name]  DOB: [birth year]  MRN: [id number]  Age / Sex: 88 y.o., female  PCP: Redmond School, MD Referring Physician: Spero Geralds, MD  Reason for Consultation: Establishing goals of care " biventricular heart failure. Currently full code."  HPI/Patient Profile: 88 y.o. female  with past medical history of atrial fibrillation on warfarin, COPD, mitral regurgitation, OSA, and nonischemic cardiomyopathy.  She presented to [facility] ED on 01/13/2022 with complaints of generalized weakness x 2 to 3 days.  Also complained of occasional shortness of breath.  She was found to be tachypneic, tachycardic, and hypertensive.  Labs significant for elevated creatinine, transaminitis, and elevated lactic acid.  She was initially admitted to the hospitalist service with acute renal failure, lactic acidosis, and bilateral pleural effusions.  Echocardiogram showed new reduction of biventricular function and severe biatrial dilation.  Central line was placed and she was started on norepinephrine and milrinone infusions.  Course complicated by acute thrombosis of left versus right (unclear) radial vein and superficial thrombophlebitis of the right basilar vein.  She was transferred to [facility] for ICU admission and further management of cardiogenic shock.  Past Medical History:  Diagnosis Date   Asthma    Atrial fibrillation (HCC)    Chronic rhinitis    COPD (chronic obstructive pulmonary disease) (HCC)    GERD (gastroesophageal reflux disease)    Mitral regurgitation    Mitral valve prolapse    Nasal polyps    Nonischemic cardiomyopathy (HCC)    LVEF 25-30% improved to 45-50%   Obstructive sleep apnea    Transudative pleural effusion     Subjective:   I have reviewed medical records including EPIC notes, labs and imaging.  I visited the patient at bedside.   She is out of bed to the recliner.  She is alert and oriented.  She is on the following infusions: Milrinone, heparin, amiodarone, and Levophed at 5 mcg.  She tells me that she is "worried" and that she does not like being in the hospital.  16:30 - I spoke with her niece/[name] by phone to discuss diagnosis, prognosis, GOC, EOL wishes, disposition, and options. [name] confirms that she is [name]'s next of kin. She is actually her great-niece.   I introduced Palliative Medicine as specialized medical care for people living with serious illness. It focuses on providing relief from the symptoms and stress of a serious illness.   We discussed a brief life review of the patient. 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 She never married and does not have biological children. However, she raised her niece ([name]'s mother) as her own child after her sister ([name]'s grandmother) died.  Bari lived with her niece ([name]'s mother) since 2012. Sadly, [name]'s mother passed away suddenly and tragically in 2022. Since then, [name] has lived with [name], her husband, and her 39 year old daughter.   As far as functional status, [name] reports there has been a decline in the last 6 weeks or so. At baseline, [name] is ambulatory but often has to "hold onto the walls" to steady herself. She is independent with her basic ADLs. When she was in better health, [name] used to enjoy shopping and going out to restaurants. More  recently however, she mainly enjoys watching TV and eating at home.   We discussed her current illness and what it means in the larger context of her ongoing co-morbidities.  We discussed that Asuka is in cardiogenic shock with acute kidney failure.  [name] verbalizes understanding of the seriousness of patient's current medical condition. Discussed the possibility that Ghina may not survive this acute illness - [name] is very aware of this.    I attempted to elicit values and goals of care important to the patient. [place] has expressed to [name] that she "wants to live" and she wants the medical team to "do whatever they can" to prolong her life. She has told [name] she is willing to do dialysis if necessary. She has also expressed that she wants to be home. Briefly discussed that home with hospice might possibly be an option pending patient's clinical course.   The difference between aggressive medical intervention and comfort care was considered in light of the patient's goals of care. The patient is interested in all aggressive medical interventions offered. We did discuss code status. Discussed DNR/DNI status and provided education on evidenced based poor outcomes in similar hospitalized patients, as the cause of the arrest is likely associated with chronic/terminal disease rather than a reversible acute cardio-pulmonary event. [name] understands prognosis would be poor in the event of cardiac arrest and indicates she agrees DNR is appropriate, however she wants to honor her aunt's wishes for now.   [name] does not think her aunt understands the seriousness of her current medical condition. For now, she wants to honor her aunt's wishes for full code full scope. However, in the event that patient further declines and no longer has capacity to make decisions, [name] would likely not pursue further aggressive care and would want her to be comfortable.   Discussed the importance of continued conversation with family and the medical team regarding overall plan of care and treatment options, ensuring decisions are within the context of the patients values and GOCs.    Review of Systems  Respiratory:  Positive for shortness of breath.    Objective:   Primary Diagnoses: Present on Admission:  Hyperkalemia   Physical Exam Vitals reviewed.  Constitutional:      General: She is not in acute distress.    Appearance: She is  ill-appearing.  Cardiovascular:     Rate and Rhythm: Tachycardia present.  Pulmonary:     Effort: Pulmonary effort is normal.  Skin:    Comments: Left hand cyanotic  Neurological:     Mental Status: She is alert and oriented to person, place, and time.    Vital Signs:  BP 96/64   Pulse (!) 142   Temp 97.6 F (36.4 C) (Oral)   Resp (!) 27   Ht '5\' 6"'$  (1.676 m)   Wt 64.1 kg   SpO2 96%   BMI 22.81 kg/m   Palliative Assessment/Data: PPS 30-40%     Assessment & Plan:   SUMMARY OF RECOMMENDATIONS   Full code full scope with ongoing discussion Niece understands the seriousness of patient's condition Niece wants to honor her aunt's wishes for now, but seems open to the concept of comfort care if her aunt further declines and no longer has capacity to participate  PMT will follow up tomorrow  Primary Decision Maker: Patient is participating in decision making for now, although it seems she does not have good insight into her condition Recommend shared decision making between patient and her niece/[name]  Prognosis:  Poor overall, she is at high risk for acute decompensation  Discharge Planning:  To Be Determined    Thank you for allowing [place] to participate in the care of [name]  MDM - [place]   Signed by: Elie Confer, NP Palliative Medicine Team  Team Phone # [phone]  For individual providers, please see AMION

## 2022-01-25 NOTE — Progress Notes (Signed)
ANTICOAGULATION CONSULT NOTE  Pharmacy Consult for heparin Indication: atrial fibrillation  Allergies  Allergen Reactions   Amoxicillin-Pot Clavulanate Itching and Swelling   Clarithromycin Itching and Swelling   Clindamycin Itching and Swelling   Doxycycline Itching and Swelling   Penicillins Itching and Swelling    Did it involve swelling of the face/tongue/throat, SOB, or low BP? Yes Did it involve sudden or severe rash/hives, skin peeling, or any reaction on the inside of your mouth or nose? No Did you need to seek medical attention at a hospital or doctor's office? Yes When did it last happen? Over 10 years     If all above answers are "NO", may proceed with cephalosporin use.     Patient Measurements: Height: '5\' 6"'$  (167.6 cm) Weight: 64.1 kg (141 lb 5 oz) IBW/kg (Calculated) : 59.3  Vital Signs: Temp: 97.9 F (36.6 C) (05/20 1216) Temp Source: Oral (05/20 1216) Pulse Rate: 142 (05/20 1216)  Labs: Recent Labs    01/10/2022 1744 02/01/2022 1931 01/23/22 0030 01/23/22 0412 01/24/22 0306 01/24/22 0920 01/24/22 1740 01/24/22 2201 01/25/22 0533 01/25/22 1038  HGB 13.4  --   --  10.9*  --  12.6  --   --  12.5  --   HCT 41.7  --   --  34.7*  --  37.6  --   --  36.2  --   PLT 127*  --   --  87*  --  105*  --   --  104*  --   APTT 29  --   --   --   --   --   --   --   --   --   LABPROT 18.9*  --   --  22.9* 22.3*  --   --   --  29.3*  --   INR 1.6*  --   --  2.1* 2.0*  --   --   --  2.8*  --   HEPARINUNFRC  --   --   --   --   --   --   --  0.22*  --  0.26*  CREATININE  --   --    < > 1.61* 2.66*  --  2.61*  --  2.52*  --   TROPONINIHS 77* 69*  --   --   --   --   --   --   --   --    < > = values in this interval not displayed.     Estimated Creatinine Clearance: 15.3 mL/min (A) (by C-G formula based on SCr of 2.52 mg/dL (H)).   Medical History: Past Medical History:  Diagnosis Date   Asthma    Atrial fibrillation (HCC)    Chronic rhinitis    COPD (chronic  obstructive pulmonary disease) (HCC)    GERD (gastroesophageal reflux disease)    Mitral regurgitation    Mitral valve prolapse    Nasal polyps    Nonischemic cardiomyopathy (HCC)    LVEF 25-30% improved to 45-50%   Obstructive sleep apnea    Transudative pleural effusion    Assessment: 88 y.o. female with medical history significant of chronic atrial fibrillation, COPD, GERD who presents to the emergency department due to 2-3 day onset of generalized weakness. Patient with hyperkalemia and afib. Patient on chronic coumadin for afib. Home dose is '5mg'$  daily per med rec. Last anticoag visit in Nov 2022, patient taking '5mg'$  daily except 2.'5mg'$  on Sunday.  Pharmacy asked  to start IV heparin with new UE thrombus. Heparin drip 1100 uts/hr heparin level 0.26 slightly < goal but bump in INR 2.8 - will not make changes to heparin drip today LFTs elevated > down trending, holding warfarin now, no bleeding CBC stable   Goal of Therapy:  HL 0.3-0.7 units/ml Monitor platelets by anticoagulation protocol: Yes   Plan:  Continue heparin infusion  1100 units/hr Daily heparin level and INR Continue to monitor H&H and platelets     Bonnita Nasuti Pharm.D. CPP, BCPS Clinical Pharmacist [phone] 01/25/2022 12:51 PM

## 2022-01-25 NOTE — Progress Notes (Signed)
Patient ID: [name], female   DOB: 09-Nov-1933, 88 y.o.   MRN: [id number]     Advanced Heart Failure Rounding Note  PCP-Cardiologist: [name], MD   Subjective:    Patient is on milrinone 0.125 and NE 5 with MAP > 65.  Co-ox better at 60% this morning, CVP 11-12.    HR still 130s-140s in atrial fibrillation on amiodarone 60 mg/hr and heparin gtt.  Creatinine 2.61 => 2.51.  Afebrile, WBCs normal yesterday.  Blood cultures NGTD.   She is eating breakfast today, denies dyspnea.    Objective:   Weight Range: 64.1 kg Body mass index is 22.81 kg/m.   Vital Signs:   Temp:  [97.5 F (36.4 C)-97.6 F (36.4 C)] 97.6 F (36.4 C) (05/20 0300) Pulse Rate:  [48-161] 138 (05/20 0300) Resp:  [17-45] 19 (05/20 0300) BP: (85-142)/(58-130) 96/64 (05/19 1952) SpO2:  [89 %-100 %] 95 % (05/20 0300) Arterial Line BP: (92-108)/(61-74) 103/61 (05/20 0200) Weight:  [64.1 kg] 64.1 kg (05/20 0500) Last BM Date :  (Prior to admit, pt does not know when)  Weight change: Filed Weights   01/12/2022 1713 01/14/2022 2212 01/25/22 0500  Weight: 63.5 kg 62.4 kg 64.1 kg    Intake/Output:   Intake/Output Summary (Last 24 hours) at 01/25/2022 0736 Last data filed at 01/25/2022 0600 Gross per 24 hour  Intake 2080.62 ml  Output 400 ml  Net 1680.62 ml      Physical Exam    General:  Frail, NAD.  HEENT: Normal Neck: Supple. JVP 10-12. Carotids 2+ bilat; no bruits. No lymphadenopathy or thyromegaly appreciated. Cor: PMI nondisplaced. Tachy, irregular rate & rhythm. No rubs, gallops or murmurs. Lungs: Decreased at bases.  Abdomen: Soft, nontender, nondistended. No hepatosplenomegaly. No bruits or masses. Good bowel sounds. Extremities: No cyanosis, clubbing, rash, edema Neuro: Alert & orientedx3, cranial nerves grossly intact. moves all 4 extremities w/o difficulty. Affect pleasant   Telemetry   Atrial fibrillation with RVR (personally reviewed)   Labs    CBC Recent Labs     01/11/2022 1744 01/23/22 0412 01/24/22 0920  WBC 7.9 6.3 8.1  NEUTROABS 7.0  --   --   HGB 13.4 10.9* 12.6  HCT 41.7 34.7* 37.6  MCV 88.9 91.6 84.9  PLT 127* 87* 902*   Basic Metabolic Panel Recent Labs    01/23/22 0412 01/24/22 0306 01/24/22 1740 01/25/22 0533  NA 134* 133* 131* 130*  K 5.4* 5.5* 4.9 4.7  CL 107 102 104 101  CO2 12* 19* 16* 18*  GLUCOSE 82 136* 182* 145*  BUN 60* 91* 90* 88*  CREATININE 1.61* 2.66* 2.61* 2.52*  CALCIUM 7.6* 8.7* 8.2* 8.3*  MG 1.6* 3.0*  --  2.8*  PHOS 3.6  --   --  4.5   Liver Function Tests Recent Labs    01/24/22 0306 01/25/22 0533  AST 82* 65*  ALT 97* 93*  ALKPHOS 336* 341*  BILITOT 1.5* 1.0  PROT 6.1* 5.4*  ALBUMIN 2.8* 2.3*   Recent Labs    01/13/2022 1719  LIPASE 33   Cardiac Enzymes No results for input(s): CKTOTAL, CKMB, CKMBINDEX, TROPONINI in the last 72 hours.  BNP: BNP (last 3 results) Recent Labs    12/12/21 0837  BNP 573.0*    ProBNP (last 3 results) No results for input(s): PROBNP in the last 8760 hours.   D-Dimer No results for input(s): DDIMER in the last 72 hours. Hemoglobin A1C No results for input(s): HGBA1C in the last  72 hours. Fasting Lipid Panel No results for input(s): CHOL, HDL, LDLCALC, TRIG, CHOLHDL, LDLDIRECT in the last 72 hours. Thyroid Function Tests Recent Labs    01/24/22 0306  TSH 2.903    Other results:   Imaging    US Venous Img Upper Uni Left (DVT)  Result Date: 01/24/2022 CLINICAL DATA:  88 year old female with left upper extremity pain and swelling. EXAM: LEFT UPPER EXTREMITY VENOUS DOPPLER ULTRASOUND TECHNIQUE: Gray-scale sonography with graded compression, as well as color Doppler and duplex ultrasound were performed to evaluate the upper extremity deep venous system from the level of the subclavian vein and including the jugular, axillary, basilic, radial, ulnar and upper cephalic vein. Spectral Doppler was utilized to evaluate flow at rest and with distal  augmentation maneuvers. COMPARISON:  None Available. FINDINGS: Contralateral Subclavian Vein: Respiratory phasicity is normal and symmetric with the symptomatic side. No evidence of thrombus. Normal compressibility. Internal Jugular Vein: No evidence of thrombus. Normal compressibility, respiratory phasicity and response to augmentation. Subclavian Vein: No evidence of thrombus. Normal compressibility, respiratory phasicity and response to augmentation. Axillary Vein: No evidence of thrombus. Normal compressibility, respiratory phasicity and response to augmentation. Cephalic Vein: No evidence of thrombus. Normal compressibility, respiratory phasicity and response to augmentation. Basilic Vein: Occlusive, hypoechoic, and mildly expansile thrombus throughout with absent compressibility. Brachial Veins: No evidence of thrombus. Normal compressibility, respiratory phasicity and response to augmentation. Radial Veins: Occlusive, hypoechoic, and mildly expansile thrombus throughout with absent compressibility. Ulnar Veins: No evidence of thrombus. Normal compressibility, respiratory phasicity and response to augmentation. Other Findings:  None visualized. IMPRESSION: 1. Acute, occlusive thrombus in the right radial vein. 2. Superficial thrombophlebitis of the right basilic vein. No evidence of surrounding suppurative changes. Ruthann Cancer, MD [facility] Electronically Signed   By: Ruthann Cancer M.D.   On: 01/24/2022 12:22     Medications:     Scheduled Medications:  arformoterol  15 mcg Nebulization BID   budesonide (PULMICORT) nebulizer solution  0.5 mg Nebulization BID   Chlorhexidine Gluconate Cloth  6 each Topical Daily   docusate sodium  100 mg Oral BID   feeding supplement  237 mL Oral BID BM   furosemide  80 mg Intravenous Once   midodrine  10 mg Oral TID WC   multivitamin with minerals  1 tablet Oral Daily   pantoprazole  40 mg Oral Daily    polyethylene glycol  17 g Oral Daily   revefenacin  175 mcg Nebulization Daily    Infusions:  sodium chloride Stopped (01/24/22 0434)   sodium chloride     amiodarone 60 mg/hr (01/25/22 0600)   heparin 1,100 Units/hr (01/25/22 0600)   milrinone 0.125 mcg/kg/min (01/25/22 0600)   norepinephrine (LEVOPHED) Adult infusion 5 mcg/min (01/25/22 0600)    PRN Medications: Place/Maintain arterial line **AND** sodium chloride, acetaminophen, albuterol, hydrocortisone, ondansetron    Assessment/Plan   1. Cardiogenic shock: Echo this admission with EF < 20%, severely decreased RV function with normal RV size, severe biatrial enlargement, severe MS with mean gradient 10 and MVA 0.85 cm^2, moderate MR, IVC dilated.  Cause of cardiomyopathy uncertain, suspect nonischemic.  Initially hypotensive with lactate > 9, lactate decreased to normal.  Co-ox improved at 60% today.  She is on NE 5, milrinone 0.125 with CVP 11-12, only 400 cc UOP yesterday but creatinine lower at 2.52. MAP stable.  - Continue current milrinone 0.125 and NE.   - Will challenge with Lasix 80 mg IV x 1.  -  Not candidate for advanced therapies with age and frailty.  2. AKI on CKD stage 3: Creatinine 2.66 => 2.52, suspect due to shock/cardiorenal.  About 400 cc UOP recorded since transfer.  - I do not think she would be a candidate for RRT.  - Support cardiac output.  3. Atrial fibrillation: Permanent.  Now with RVR 130s-140s.   - Continue amiodarone gtt 60 mg/hr.  - Cardioversion unlikely to be successful given duration of AF.  4. RUE DVT: She is on heparin gtt.  5. COPD: H/o smoking. On 4L [place].  6. Mitral stenosis: Severe.  This has been known for several years.  Severely calcified MV.  Not candidate for valve surgery.  7. Hyperkalemia: Improved.  8. Thrombocytopenia: Mild, suspect due to critical illness and not HIT.  9. Elevated LFTs: Mild, likely from shock liver.  10. Discussion with patient and family yesterday, she  remains full code.  Will involve palliative care service, suspect prognosis is poor.   CRITICAL CARE Performed by: Loralie Champagne  Total critical care time: 35 minutes  Critical care time was exclusive of separately billable procedures and treating other patients.  Critical care was necessary to treat or prevent imminent or life-threatening deterioration.  Critical care was time spent personally by me on the following activities: development of treatment plan with patient and/or surrogate as well as nursing, discussions with consultants, evaluation of patient's response to treatment, examination of patient, obtaining history from patient or surrogate, ordering and performing treatments and interventions, ordering and review of laboratory studies, ordering and review of radiographic studies, pulse oximetry and re-evaluation of patient's condition.    Length of Stay: 3  Loralie Champagne, MD  01/25/2022, 7:36 AM  Advanced Heart Failure Team Pager [phone] (M-F; 7a - 5p)  Please contact [facility] for night-coverage after hours (5p -7a ) and weekends on [email or web address]

## 2022-01-25 NOTE — Progress Notes (Signed)
Physical Therapy Treatment Patient Details Name: [name] MRN: [id number] DOB: Jul 16, 1934 Today's Date: 01/25/2022   History of Present Illness [name] is a 88 y.o. female who presents to [facility] on 5/18 due generalized weakness and SOB. Found to be tachypneic, tachycardic and hypotensive. Admitted for Renal failure, transaminitis and bilateral moderate pleural effusions. Echo- new reduction of biventricular function and severe bi-atrial dilation. Diagnosed with cardiogenic shock and tx to [facility] ICU 5/19. Course complicated by acute DVT L vs R (unclear) radial vein and superficial thrombophlebitis of the rt basilic vein. PMH includes A-fib, COPD, Nonischemic cardiomyopathy.    PT Comments    Patient progressing slowly towards PT goals. Session limited to transferring to chair due to tachycardia with HR staying in 130s-150s bpm at rest. Pt reports feeling tired but motivated to get OOB to chair for lunch. Requires Min-mod A for standing and step pivot transfer to chair. Plan to progress gait as soon as pt more stable. Reports feeling slightly confused but oriented and following commands this session. Will continue to follow and progress as able.   Recommendations for follow up therapy are one component of a multi-disciplinary discharge planning process, led by the attending physician.  Recommendations may be updated based on patient status, additional functional criteria and insurance authorization.  Follow Up Recommendations  Skilled nursing-short term rehab (<3 hours/day)     Assistance Recommended at Discharge Intermittent Supervision/Assistance  Patient can return home with the following A lot of help with bathing/dressing/bathroom;A lot of help with walking and/or transfers;Help with stairs or ramp for entrance;Assistance with cooking/housework   Equipment Recommendations  None recommended by PT    Recommendations for Other Services       Precautions / Restrictions  Precautions Precautions: Fall;Other (comment) Precaution Comments: watch BP/HR Restrictions Weight Bearing Restrictions: No     Mobility  Bed Mobility Overal bed mobility: Needs Assistance Bed Mobility: Supine to Sit     Supine to sit: Min guard, HOB elevated     General bed mobility comments: Assist to scoot bottom to EOB using pad and to manage lines. Increased time.    Transfers Overall transfer level: Needs assistance Equipment used: 1 person hand held assist Transfers: Sit to/from Stand Sit to Stand: Min assist   Step pivot transfers: Mod assist       General transfer comment: Min A to power to standing with cues for hand placement/technique, Stood from EOB x1, able tot ake a few steps to get to chair with Mod A.    Ambulation/Gait               General Gait Details: Deferred per RN due to tachycardia with HR staying elevated in 140s-150s.   Stairs             Wheelchair Mobility    Modified Rankin (Stroke Patients Only)       Balance Overall balance assessment: Needs assistance Sitting-balance support: Feet supported, No upper extremity supported Sitting balance-Leahy Scale: Fair     Standing balance support: During functional activity, Reliant on assistive device for balance Standing balance-Leahy Scale: Poor Standing balance comment: Reliant on therapist for support.                            Cognition Arousal/Alertness: Awake/alert Behavior During Therapy: WFL for tasks assessed/performed Overall Cognitive Status: No family/caregiver present to determine baseline cognitive functioning  General Comments: Follows commands well, some mild confusion which pt reports she is aware of. Oriented to place and time.        Exercises      General Comments General comments (skin integrity, edema, etc.): HR ranging from 130-150s bpm with activity. BP soft but stable.       Pertinent Vitals/Pain Pain Assessment Pain Assessment: Faces Faces Pain Scale: Hurts a little bit Pain Location: UEs Pain Descriptors / Indicators: Sore Pain Intervention(s): Monitored during session, Repositioned    Home Living                          Prior Function            PT Goals (current goals can now be found in the care plan section) Acute Rehab PT Goals Patient Stated Goal: get stronger and go home PT Goal Formulation: With patient Time For Goal Achievement: 02/08/22 Potential to Achieve Goals: Good Progress towards PT goals: Progressing toward goals    Frequency    Min 3X/week      PT Plan Current plan remains appropriate    Co-evaluation              AM-PAC PT "6 Clicks" Mobility   Outcome Measure  Help needed turning from your back to your side while in a flat bed without using bedrails?: A Little Help needed moving from lying on your back to sitting on the side of a flat bed without using bedrails?: A Little Help needed moving to and from a bed to a chair (including a wheelchair)?: A Lot Help needed standing up from a chair using your arms (e.g., wheelchair or bedside chair)?: A Little Help needed to walk in hospital room?: Total Help needed climbing 3-5 steps with a railing? : Total 6 Click Score: 13    End of Session Equipment Utilized During Treatment: Oxygen Activity Tolerance: Patient tolerated treatment well;Patient limited by fatigue Patient left: in chair;with call bell/phone within reach;with chair alarm set Nurse Communication: Mobility status PT Visit Diagnosis: Unsteadiness on feet (R26.81);Other abnormalities of gait and mobility (R26.89);Muscle weakness (generalized) (M62.81)     Time: 9163-8466 PT Time Calculation (min) (ACUTE ONLY): 20 min  Charges:  $Therapeutic Activity: 8-22 mins                     Marisa Severin, PT, DPT Acute Rehabilitation Services Secure chat preferred Office  [phone]      Marguarite Arbour A Sabra Heck 01/25/2022, 12:43 PM

## 2022-01-25 NOTE — Progress Notes (Signed)
NAME:  [name], MRN:  [id number], DOB:  1934/07/09, LOS: 3 ADMISSION DATE:  01/12/2022, CONSULTATION DATE:  5/19 REFERRING MD:  Dr. Aundra Dubin, CHIEF COMPLAINT:  cardiogenic shock   History of Present Illness:  88 year old female with PMH as below, which is significant for persistent atrial fibrillation on warfarin, COPD, mitral regurgitation, OSA, and non-ischemic cardiomyopathy. She presented to [facility] ED 5/18 with complaints of generalized weakness x 2-3 days. Also complained of occasional shortness of breath. She was found to be tachypneic and tachycardic. Also hypotensive to 91/75. Workup also significant for Renal failure, transaminitis, and elevated lactic acid. She was admitted to the hospitalist service with renal failure, lactic acidosis , and bilateral moderate pleural effusions. Workup included echocardiogram, which was significant for new reduction of biventricular function and severe bi-atrial dilation. Diagnosed with cardiogenic shock. Central line was placed and she was started on norepinephrine and milrinone infusions. Course complicated by acute thrombosis of L vs R (unclear) radial vein and superficial thrombophlebitis of the rt basilic vein. Started on heparin infusion. She was transferred [facility] for ICU admission and heart failure specialist evaluation.   Pertinent  Medical History   has a past medical history of Asthma, Atrial fibrillation ([place]), Chronic rhinitis, COPD (chronic obstructive pulmonary disease) ([place]), GERD (gastroesophageal reflux disease), Mitral regurgitation, Mitral valve prolapse, Nasal polyps, Nonischemic cardiomyopathy ([place]), Obstructive sleep apnea, and Transudative pleural effusion.   Significant Hospital Events: Including procedures, antibiotic start and stop dates in addition to other pertinent events   5/18 admit to [facility], Cardiogenic shock 5/19 transfer to [facility] for ICU, Heart failure eval.   Interim History / Subjective:   Overnight no issues. Norepi at 5.  Milrinone at 0.125 SBP 90.   Objective   Blood pressure 96/64, pulse (!) 151, temperature 97.6 F (36.4 C), temperature source Axillary, resp. rate (!) 34, height '5\' 6"'$  (1.676 m), weight 64.1 kg, SpO2 94 %. CVP:  [4 mmHg-21 mmHg] 12 mmHg      Intake/Output Summary (Last 24 hours) at 01/25/2022 0808 Last data filed at 01/25/2022 0600 Gross per 24 hour  Intake 1673.29 ml  Output 400 ml  Net 1273.29 ml   Filed Weights   01/11/2022 1713 01/18/2022 2212 01/25/22 0500  Weight: 63.5 kg 62.4 kg 64.1 kg    Examination: Gen:      Elderly, frail,  HEENT:  ETT to vent Lungs:   diminished no wheezes CV:         tachycardic, irregularly irreguarly Abd:      + bowel sounds; soft, non-tender; no palpable masses, no distension Ext:   Trace pedal edema Skin:      Warm and dry; no rashes Neuro:   awake alert oriented, follows commands   Na 130 K 4.7 Cr 2.52 with BUN 88 INR 2..8 Transaminitis   Resolved Hospital Problem list     Assessment & Plan:   Cardiogenic shock: secondary to biventricular heart failure. Echocardiogram 5/18 showed LVEF less than 20% as well as severely reduced RV function. Severe mitral stenosis.  - management per heart failure specialist - Coox 57 > 49 - Levophed - milrinone - MAP goal 65 - not a candidate for advanced therapies - not a candidate for mitral valve surgery - Trend Co-ox, CVP - diuresis per HF if indicated. 80 of lasix ordered today  Atrial fibrillation with RVR: persistent AF at baseline. Currently AF rate 150. Anticoagulated with warfarin chronically. INR 2.6 and currently coumadin is held  on heparin gtt - Amiodarone gtt. Holding digoxin from home due to AKI - heparin per pharmacy - will try to get norepi off today to help with rate control. Otherwise may need to add back some digoxin at a renally modified dose.   AKI on CKD:  - Trend chemistry and UOP - not a candidate for HD - shock treatment as above.    Bilateral pleural effusions: R>L almost certainly related to heart failure and MR.  - breathing relatively comfortably. No role for thoracentesis at this time. Should improve with HF treatment.   COPD/Asthma:  [facility]er outpatient. Will replace with brovana/budesonide/yupelri nebs - no role for systemic steroids  Upper extremity DVT:  ultrasound imaging of the left upper read as DVT in the right radial vein. Clinically the L hand is cyanotic so I suspect this is a misread.  - heparin - may need to follow up with radiology.   Dysphagia - slp eval  Goals of care: full code per patient who is clear in her wishes but does have some forgetfulness. Niece Danae Chen is also involved in [place].     Best Practice (right click and "Reselect all SmartList Selections" daily)   Diet/type: Regular consistency (see orders) and NPO DVT prophylaxis: systemic heparin GI prophylaxis: PPI Lines: Central line and Arterial Line Foley:  Yes, and it is still needed Code Status:  full code Last date of multidisciplinary goals of care discussion [ 5/19 see documentation ]  Labs   CBC: Recent Labs  Lab 01/18/2022 1719 02/05/2022 1744 01/23/22 0412 01/24/22 0920  WBC 8.7 7.9 6.3 8.1  NEUTROABS  --  7.0  --   --   HGB 14.0 13.4 10.9* 12.6  HCT 42.7 41.7 34.7* 37.6  MCV 86.4 88.9 91.6 84.9  PLT 135* 127* 87* 105*    Basic Metabolic Panel: Recent Labs  Lab 01/23/22 0030 01/23/22 0412 01/24/22 0306 01/24/22 1740 01/25/22 0533  NA 135 134* 133* 131* 130*  K 5.7* 5.4* 5.5* 4.9 4.7  CL 105 107 102 104 101  CO2 17* 12* 19* 16* 18*  GLUCOSE 120* 82 136* 182* 145*  BUN 84* 60* 91* 90* 88*  CREATININE 2.66* 1.61* 2.66* 2.61* 2.52*  CALCIUM 8.5* 7.6* 8.7* 8.2* 8.3*  MG 2.5* 1.6* 3.0*  --  2.8*  PHOS  --  3.6  --   --  4.5   GFR: Estimated Creatinine Clearance: 15.3 mL/min (A) (by C-G formula based on SCr of 2.52 mg/dL (H)). Recent Labs  Lab 01/14/2022 1719 01/21/2022 1744 01/18/2022 1931  01/23/22 0412 01/23/22 0645 01/24/22 0631 01/24/22 0920  WBC 8.7 7.9  --  6.3  --   --  8.1  LATICACIDVEN  --  2.2*   < > >9.0* 2.9* 1.5 1.5   < > = values in this interval not displayed.    Liver Function Tests: Recent Labs  Lab 01/27/2022 1719 01/23/22 0412 01/24/22 0306 01/25/22 0533  AST 70* 52* 82* 65*  ALT 75* 51* 97* 93*  ALKPHOS 216* 149* 336* 341*  BILITOT 2.7* 1.4* 1.5* 1.0  PROT 7.7 4.1* 6.1* 5.4*  ALBUMIN 3.7 1.9* 2.8* 2.3*   Recent Labs  Lab 02/01/2022 1719  LIPASE 33   No results for input(s): AMMONIA in the last 168 hours.  ABG    Component Value Date/Time   PHART 7.468 (H) 09/30/2010 0923   PCO2ART 39.9 09/30/2010 0923   PO2ART 65.0 (L) 09/30/2010 0923   HCO3 24.5 (H) 09/30/2010 7591  TCO2 26 09/30/2010 0936   ACIDBASEDEF 1.0 09/30/2010 0936   O2SAT 60.4 01/25/2022 0541     Coagulation Profile: Recent Labs  Lab 01/10/2022 1744 01/23/22 0412 01/24/22 0306 01/25/22 0533  INR 1.6* 2.1* 2.0* 2.8*    Cardiac Enzymes: No results for input(s): CKTOTAL, CKMB, CKMBINDEX, TROPONINI in the last 168 hours.  HbA1C: No results found for: HGBA1C  CBG: Recent Labs  Lab 01/14/2022 2004 01/10/2022 2203 01/23/22 0717 01/23/22 1155 01/23/22 1525  GLUCAP 137* 113* 112* 138* 153*     Critical care time: 38 minutes    The patient is critically ill due to cardiogenic shock.  Critical care was necessary to treat or prevent imminent or life-threatening deterioration.  Critical care was time spent personally by me on the following activities: development of treatment plan with patient and/or surrogate as well as nursing, discussions with consultants, evaluation of patient's response to treatment, examination of patient, obtaining history from patient or surrogate, ordering and performing treatments and interventions, ordering and review of laboratory studies, ordering and review of radiographic studies, pulse oximetry, re-evaluation of patient's condition and  participation in multidisciplinary rounds.   Critical Care Time devoted to patient care services described in this note is 38 minutes. This time reflects time of care of this [place] . This critical care time does not reflect separately billable procedures or procedure time, teaching time or supervisory time of PA/NP/Med student/Med Resident etc but could involve care discussion time.       Spero Geralds [facility] 01/25/2022 8:10 AM  Pager: see AMION  If no response to pager , please call critical care on call (see AMION) until 7pm After 7:00 pm call [place]

## 2022-01-26 DIAGNOSIS — E875 Hyperkalemia: Secondary | ICD-10-CM | POA: Diagnosis not present

## 2022-01-26 DIAGNOSIS — I5082 Biventricular heart failure: Secondary | ICD-10-CM | POA: Diagnosis not present

## 2022-01-26 DIAGNOSIS — I4891 Unspecified atrial fibrillation: Secondary | ICD-10-CM | POA: Diagnosis not present

## 2022-01-26 DIAGNOSIS — N179 Acute kidney failure, unspecified: Secondary | ICD-10-CM | POA: Diagnosis not present

## 2022-01-26 LAB — CBC
HCT: 34.4 % — ABNORMAL LOW (ref 36.0–46.0)
Hemoglobin: 12 g/dL (ref 12.0–15.0)
MCH: 28.4 pg (ref 26.0–34.0)
MCHC: 34.9 g/dL (ref 30.0–36.0)
MCV: 81.3 fL (ref 80.0–100.0)
Platelets: UNDETERMINED 10*3/uL (ref 150–400)
RBC: 4.23 MIL/uL (ref 3.87–5.11)
RDW: 16.6 % — ABNORMAL HIGH (ref 11.5–15.5)
WBC: 8.2 10*3/uL (ref 4.0–10.5)
nRBC: 2.1 % — ABNORMAL HIGH (ref 0.0–0.2)

## 2022-01-26 LAB — BASIC METABOLIC PANEL
Anion gap: 10 (ref 5–15)
BUN: 85 mg/dL — ABNORMAL HIGH (ref 8–23)
CO2: 20 mmol/L — ABNORMAL LOW (ref 22–32)
Calcium: 8.2 mg/dL — ABNORMAL LOW (ref 8.9–10.3)
Chloride: 99 mmol/L (ref 98–111)
Creatinine, Ser: 2.46 mg/dL — ABNORMAL HIGH (ref 0.44–1.00)
GFR, Estimated: 19 mL/min — ABNORMAL LOW (ref >60)
Glucose, Bld: 130 mg/dL — ABNORMAL HIGH (ref 70–99)
Potassium: 4.7 mmol/L (ref 3.5–5.1)
Sodium: 129 mmol/L — ABNORMAL LOW (ref 135–145)

## 2022-01-26 LAB — COOXEMETRY PANEL
Carboxyhemoglobin: 1.6 % — ABNORMAL HIGH (ref 0.5–1.5)
Methemoglobin: 0.8 % (ref 0.0–1.5)
O2 Saturation: 60.2 %
Total hemoglobin: 12.1 g/dL (ref 12.0–16.0)

## 2022-01-26 LAB — HEPARIN LEVEL (UNFRACTIONATED): Heparin Unfractionated: 0.29 IU/mL — ABNORMAL LOW (ref 0.30–0.70)

## 2022-01-26 LAB — PROTIME-INR
INR: 2.3 — ABNORMAL HIGH (ref 0.8–1.2)
Prothrombin Time: 25.2 seconds — ABNORMAL HIGH (ref 11.4–15.2)

## 2022-01-26 MED ORDER — FUROSEMIDE 10 MG/ML IJ SOLN
80.0000 mg | Freq: Once | INTRAMUSCULAR | Status: AC
Start: 2022-01-26 — End: 2022-01-26
  Administered 2022-01-26: 80 mg via INTRAVENOUS
  Filled 2022-01-26: qty 8

## 2022-01-26 MED ORDER — HYDROMORPHONE HCL 1 MG/ML IJ SOLN
0.5000 mg | INTRAMUSCULAR | Status: DC | PRN
  Administered 2022-01-27: 1 mg via INTRAVENOUS
  Administered 2022-01-28: 0.5 mg via INTRAVENOUS
  Administered 2022-01-28 – 2022-01-29 (×2): 1 mg via INTRAVENOUS
  Filled 2022-01-26 (×4): qty 1

## 2022-01-26 MED ORDER — LORAZEPAM 2 MG/ML IJ SOLN
1.0000 mg | INTRAMUSCULAR | Status: DC | PRN
  Administered 2022-01-26 – 2022-01-28 (×2): 1 mg via INTRAVENOUS
  Administered 2022-01-29: 2 mg via INTRAVENOUS
  Filled 2022-01-26 (×3): qty 1

## 2022-01-26 MED ORDER — DIPHENHYDRAMINE-ZINC ACETATE 2-0.1 % EX CREA
TOPICAL_CREAM | Freq: Three times a day (TID) | CUTANEOUS | Status: AC | PRN
  Filled 2022-01-26: qty 28

## 2022-01-26 NOTE — IPAL (Signed)
  Interdisciplinary Goals of Care Family Meeting   Date carried out: 01/26/2022  Location of the meeting: Bedside  Member's involved: Physician and Family Member or next of kin with patient  Durable Power of Attorney or Loss adjuster, chartered: the patient who is present with her niece erica.    Discussion: We discussed goals of care for [name] .  Discussed that this is end stage heart failure and she is not responding to diuretics nor efforts to titrate down vasopressors. This unfortunately means this is terminal congestive heart failure and we are approaching end of life. Patient seems to understand this and expresses a desire to just go home if she can. Agreeable to DNR and DNI.   Code status: Full DNR  Disposition: Continue current acute care will call palliative care team to explore home vs inpatient hospice.   Time spent for the meeting: 15 minutes    Spero Geralds, MD  01/26/2022, 9:36 AM

## 2022-01-26 NOTE — Progress Notes (Signed)
NAME:  [name], MRN:  [id number], DOB:  [birth year], LOS: 4 ADMISSION DATE:  01/21/2022, CONSULTATION DATE:  5/19 REFERRING MD:  Dr. Aundra Dubin, CHIEF COMPLAINT:  cardiogenic shock   History of Present Illness:  88 year old female with PMH as below, which is significant for persistent atrial fibrillation on warfarin, COPD, mitral regurgitation, OSA, and non-ischemic cardiomyopathy. She presented to [facility] ED 5/18 with complaints of generalized weakness x 2-3 days. Also complained of occasional shortness of breath. She was found to be tachypneic and tachycardic. Also hypotensive to 91/75. Workup also significant for Renal failure, transaminitis, and elevated lactic acid. She was admitted to the hospitalist service with renal failure, lactic acidosis , and bilateral moderate pleural effusions. Workup included echocardiogram, which was significant for new reduction of biventricular function and severe bi-atrial dilation. Diagnosed with cardiogenic shock. Central line was placed and she was started on norepinephrine and milrinone infusions. Course complicated by acute thrombosis of L vs R (unclear) radial vein and superficial thrombophlebitis of the rt basilic vein. Started on heparin infusion. She was transferred [facility] [place] for ICU admission and heart failure specialist evaluation.   Pertinent  Medical History   has a past medical history of Asthma, Atrial fibrillation ([place]), Chronic rhinitis, COPD (chronic obstructive pulmonary disease) ([place]), GERD (gastroesophageal reflux disease), Mitral regurgitation, Mitral valve prolapse, Nasal polyps, Nonischemic cardiomyopathy ([place]), Obstructive sleep apnea, and Transudative pleural effusion.   Significant Hospital Events: Including procedures, antibiotic start and stop dates in addition to other pertinent events   5/18 admit to [facility], Cardiogenic shock 5/19 transfer to [facility] for ICU, Heart failure eval.   Interim History / Subjective:   Feeling sad saying she wants to go home. Did not achieve net negative fluid balance she is actually positive 6L.   Objective   Blood pressure 96/64, pulse (!) 132, temperature (!) 97.4 F (36.3 C), temperature source Axillary, resp. rate (!) 24, height '5\' 6"'$  (1.676 m), weight 66.3 kg, SpO2 96 %. CVP:  [1 mmHg-21 mmHg] 11 mmHg      Intake/Output Summary (Last 24 hours) at 01/26/2022 0834 Last data filed at 01/26/2022 0600 Gross per 24 hour  Intake 1390.59 ml  Output 875 ml  Net 515.59 ml   Filed Weights   01/09/2022 2212 01/25/22 0500 01/26/22 0500  Weight: 62.4 kg 64.1 kg 66.3 kg    Examination: Elderly, frail Sitting up in bed LUE with edema and ecchymoses consistent with known arterial thrombus RRR no mrg  Did not diurese much yesterday with 80 mg lasix IV x 1  Na 129 K 4.7 Cr 2.46 WBC 8.2 Hgb 12.0   Resolved Hospital Problem list     Assessment & Plan:   Cardiogenic shock: secondary to biventricular heart failure. Echocardiogram 5/18 showed LVEF less than 20% as well as severely reduced RV function. Severe mitral stenosis.  - management per heart failure specialist - Coox 57 > 49 - Levophed and milrinone - MAP goal 65 - not a candidate for advanced therapies - not a candidate for mitral valve surgery - Trend Co-ox, CVP - diuresis per HF if indicated. 80 of lasix ordered today  Atrial fibrillation with RVR: persistent AF at baseline. Currently AF rate 150. Anticoagulated with warfarin chronically. INR 2.6 and currently coumadin is held on heparin gtt - Amiodarone gtt. Holding digoxin from home due to AKI - heparin per pharmacy - will try to get norepi off today to help with rate control. Otherwise may need to add  back some digoxin at a renally modified dose.   AKI on CKD:  - Trend chemistry and UOP - not a candidate for HD - shock treatment as above.   Bilateral pleural effusions: R>L almost certainly related to heart failure and MR.  - breathing  relatively comfortably. No role for thoracentesis at this time. Should improve with HF treatment.   COPD/Asthma:  [facility]ith[facility] Sauger outpatient. Will replace with brovana/budesonide/yupelri nebs - no role for systemic steroids  Right radial arterial thrombus:  - heparin  Goals of care: talked with patient this morning about setting up time to talk with her and her niece about goals of care. I think she is approaching the end of her life and inpatient hospice would be most appropriate. Patient surprised but agreeable to further conversations.    Best Practice (right click and "Reselect all SmartList Selections" daily)   Diet/type: Regular consistency (see orders) and NPO DVT prophylaxis: systemic heparin GI prophylaxis: PPI Lines: Central line and Arterial Line Foley:  Yes, and it is still needed Code Status:  full code Last date of multidisciplinary goals of care discussion [ 5/19 see documentation ]  Labs   CBC: Recent Labs  Lab 01/30/2022 1744 01/23/22 0412 01/24/22 0920 01/25/22 0533 01/26/22 0419  WBC 7.9 6.3 8.1 8.1 8.2  NEUTROABS 7.0  --   --   --   --   HGB 13.4 10.9* 12.6 12.5 12.0  HCT 41.7 34.7* 37.6 36.2 34.4*  MCV 88.9 91.6 84.9 82.3 81.3  PLT 127* 87* 105* 104* PLATELET CLUMPS NOTED ON SMEAR, UNABLE TO ESTIMATE    Basic Metabolic Panel: Recent Labs  Lab 01/23/22 0030 01/23/22 0412 01/24/22 0306 01/24/22 1740 01/25/22 0533 01/26/22 0419  NA 135 134* 133* 131* 130* 129*  K 5.7* 5.4* 5.5* 4.9 4.7 4.7  CL 105 107 102 104 101 99  CO2 17* 12* 19* 16* 18* 20*  GLUCOSE 120* 82 136* 182* 145* 130*  BUN 84* 60* 91* 90* 88* 85*  CREATININE 2.66* 1.61* 2.66* 2.61* 2.52* 2.46*  CALCIUM 8.5* 7.6* 8.7* 8.2* 8.3* 8.2*  MG 2.5* 1.6* 3.0*  --  2.8*  --   PHOS  --  3.6  --   --  4.5  --    GFR: Estimated Creatinine Clearance: 15.7 mL/min (A) (by C-G formula based on SCr of 2.46 mg/dL (H)). Recent Labs  Lab 01/23/22 0412 01/23/22 0645 01/24/22 0631 01/24/22 0920  01/25/22 0533 01/26/22 0419  WBC 6.3  --   --  8.1 8.1 8.2  LATICACIDVEN >9.0* 2.9* 1.5 1.5  --   --     Liver Function Tests: Recent Labs  Lab 01/25/2022 1719 01/23/22 0412 01/24/22 0306 01/25/22 0533  AST 70* 52* 82* 65*  ALT 75* 51* 97* 93*  ALKPHOS 216* 149* 336* 341*  BILITOT 2.7* 1.4* 1.5* 1.0  PROT 7.7 4.1* 6.1* 5.4*  ALBUMIN 3.7 1.9* 2.8* 2.3*   Recent Labs  Lab 01/13/2022 1719  LIPASE 33   No results for input(s): AMMONIA in the last 168 hours.  ABG    Component Value Date/Time   PHART 7.468 (H) 09/30/2010 0923   PCO2ART 39.9 09/30/2010 0923   PO2ART 65.0 (L) 09/30/2010 0923   HCO3 24.5 (H) 09/30/2010 0936   TCO2 26 09/30/2010 0936   ACIDBASEDEF 1.0 09/30/2010 0936   O2SAT 60.2 01/26/2022 0419     Coagulation Profile: Recent Labs  Lab 01/28/2022 1744 01/23/22 0412 01/24/22 0306 01/25/22 0533 01/26/22 0419  INR 1.6*  2.1* 2.0* 2.8* 2.3*    Cardiac Enzymes: No results for input(s): CKTOTAL, CKMB, CKMBINDEX, TROPONINI in the last 168 hours.  HbA1C: No results found for: HGBA1C  CBG: Recent Labs  Lab 01/06/2022 2004 01/23/2022 2203 01/23/22 0717 01/23/22 1155 01/23/22 1525  GLUCAP 137* 113* 112* 138* 153*     Critical care time: 50 minutes    The patient is critically ill due to cardiogenic shock.  Critical care was necessary to treat or prevent imminent or life-threatening deterioration.  Critical care was time spent personally by me on the following activities: development of treatment plan with patient and/or surrogate as well as nursing, discussions with consultants, evaluation of patient's response to treatment, examination of patient, obtaining history from patient or surrogate, ordering and performing treatments and interventions, ordering and review of laboratory studies, ordering and review of radiographic studies, pulse oximetry, re-evaluation of patient's condition and participation in multidisciplinary rounds.   Critical Care Time  devoted to patient care services described in this note is 50 minutes. This time reflects time of care of this [place] . This critical care time does not reflect separately billable procedures or procedure time, teaching time or supervisory time of PA/NP/Med student/Med Resident etc but could involve care discussion time.       Spero Geralds [facility] 01/26/2022 8:46 AM  Pager: see AMION  If no response to pager , please call critical care on call (see AMION) until 7pm After 7:00 pm call Elink

## 2022-01-26 NOTE — Progress Notes (Signed)
eLink Physician-Brief Progress Note Patient Name: [name] DOB: July 05, 1934 MRN: [id number]   Date of Service  01/26/2022  HPI/Events of Note  Patient with itching around her dialysis site.  eICU Interventions  PRN Benadryl cream ordered        Alberto Schoch U Marjorie Lussier 01/26/2022, 2:11 AM

## 2022-01-26 NOTE — Progress Notes (Signed)
ANTICOAGULATION CONSULT NOTE  Pharmacy Consult for heparin Indication: atrial fibrillation  Allergies  Allergen Reactions   Amoxicillin-Pot Clavulanate Itching and Swelling   Clarithromycin Itching and Swelling   Clindamycin Itching and Swelling   Doxycycline Itching and Swelling   Penicillins Itching and Swelling    Did it involve swelling of the face/tongue/throat, SOB, or low BP? Yes Did it involve sudden or severe rash/hives, skin peeling, or any reaction on the inside of your mouth or nose? No Did you need to seek medical attention at a hospital or doctor's office? Yes When did it last happen? Over 10 years     If all above answers are "NO", may proceed with cephalosporin use.     Patient Measurements: Height: '5\' 6"'$  (167.6 cm) Weight: 66.3 kg (146 lb 2.6 oz) IBW/kg (Calculated) : 59.3  Vital Signs: Temp: 97.6 F (36.4 C) (05/21 0900) Temp Source: Oral (05/21 0900) Pulse Rate: 132 (05/21 0600)  Labs: Recent Labs    01/24/22 0306 01/24/22 0306 01/24/22 0920 01/24/22 1740 01/24/22 2201 01/25/22 0533 01/25/22 1038 01/26/22 0419 01/26/22 0423  HGB  --    < > 12.6  --   --  12.5  --  12.0  --   HCT  --   --  37.6  --   --  36.2  --  34.4*  --   PLT  --   --  105*  --   --  104*  --  PLATELET CLUMPS NOTED ON SMEAR, UNABLE TO ESTIMATE  --   LABPROT 22.3*  --   --   --   --  29.3*  --  25.2*  --   INR 2.0*  --   --   --   --  2.8*  --  2.3*  --   HEPARINUNFRC  --   --   --   --  0.22*  --  0.26*  --  0.29*  CREATININE 2.66*  --   --  2.61*  --  2.52*  --  2.46*  --    < > = values in this interval not displayed.     Estimated Creatinine Clearance: 15.7 mL/min (A) (by C-G formula based on SCr of 2.46 mg/dL (H)).   Medical History: Past Medical History:  Diagnosis Date   Asthma    Atrial fibrillation (HCC)    Chronic rhinitis    COPD (chronic obstructive pulmonary disease) (HCC)    GERD (gastroesophageal reflux disease)    Mitral regurgitation    Mitral  valve prolapse    Nasal polyps    Nonischemic cardiomyopathy (HCC)    LVEF 25-30% improved to 45-50%   Obstructive sleep apnea    Transudative pleural effusion    Assessment: 88 y.o. female with medical history significant of chronic atrial fibrillation, COPD, GERD who presents to the emergency department due to 2-3 day onset of generalized weakness. Patient with hyperkalemia and afib. Patient on chronic coumadin for afib. Home dose is '5mg'$  daily per med rec. Last anticoag visit in Nov 2022, patient taking '5mg'$  daily except 2.'5mg'$  on Sunday.  Pharmacy asked to start IV heparin with new UE thrombus. Heparin drip 1100 uts/hr heparin level 0.29 at goal with INR slowly trend down  INR 2.8 >2.3 last dose warfarin 5/18  LFTs elevated > down trending, holding warfarin now, no bleeding CBC stable   Goal of Therapy:  HL 0.3-0.7 units/ml Monitor platelets by anticoagulation protocol: Yes   Plan:  Continue heparin infusion  1100 units/hr Daily heparin level and INR Continue to monitor H&H and platelets     Bonnita Nasuti Pharm.D. CPP, BCPS Clinical Pharmacist [phone] 01/26/2022 9:11 AM

## 2022-01-26 NOTE — Progress Notes (Signed)
Palliative Medicine Progress Note   Patient Name: [name]       Date: 01/26/2022 DOB: November 07, 1933  Age: 88 y.o. MRN#: [id number] Attending Physician: Spero Geralds, MD Primary Care Physician: Redmond School, MD Admit Date: 01/28/2022   HPI/Patient Profile: 88 y.o. female  with past medical history of atrial fibrillation on warfarin, COPD, mitral regurgitation, OSA, and nonischemic cardiomyopathy.  She presented to [facility] ED on 01/07/2022 with complaints of generalized weakness x 2 to 3 days.  Also complained of occasional shortness of breath.  She was found to be tachypneic, tachycardic, and hypertensive.  Labs significant for elevated creatinine, transaminitis, and elevated lactic acid.  She was initially admitted to the hospitalist service with acute renal failure, lactic acidosis, and bilateral pleural effusions.  Echocardiogram showed new reduction of biventricular function and severe biatrial dilation.  Central line was placed and she was started on norepinephrine and milrinone infusions.  Course complicated by acute thrombosis of left versus right (unclear) radial vein and superficial thrombophlebitis of the right basilar vein.  She was transferred to [facility] for ICU admission and further management of cardiogenic shock.   Subjective: Dr. Shearon Stalls spoke with patient and her niece this morning regarding goals of care.  It was discussed this is end-stage heart failure and she is approaching end-of-life.     I met at bedside with patient and her niece [name] to discuss transition to comfort care and options for disposition.  [name] verbalizes understanding that there are no other treatment options as unfortunately [name] is not responding to diuretics or efforts to decrease vasopressors.   [name] states that she wants to go home.  I expressed concern that home with hospice may not be the best option.  Discussed that she is on multiple life prolonging medications, and that her time will likely be very limited when these are turned off. I recommended focusing on comfort for now, and that we could further discuss the possibility of home with hospice pending her clinical course.  Discussed discontinuing labs, liberalizing diet, and liberalizing visitation. [name] wants her sister to visit from [place]. Regarding symptom management, [name] does endorse ongoing shortness of breath as well as anxiety. Discussed utilizing opioids and benzodiazepines as needed to treat pain, dyspnea, and anxiety patient is agreeable to this plan of care.   I  later spoke with [name] by phone.  She fully agrees that home with hospice is unfortunately not a practical option. However, she wants to honor her aunts wishes by telling her that home is at least still an option.  Created space and opportunity for [name] to express thoughts and feelings regarding patient's current medical situation.  Discussed natural trajectory at end-of-life emotional support provided   [name] understands that when milrinone and Levophed infusions are stopped, [name] may pass within a few hours.  Discussed increasing opioid medications prior to stopping the infusions to reduce symptom burden at end-of-life.  [name] states that [name]'s sister should arrive from [place] around 5 PM Monday.  We will tentatively plan to wean off all infusions Tuesday 5/23.   Objective:  Physical Exam Constitutional:      General: She is not in acute distress.    Appearance: She is ill-appearing.  Cardiovascular:     Rate and Rhythm: Tachycardia present.  Neurological:     Mental Status: She is alert and oriented to person, place, and time.  Psychiatric:        Cognition and Memory: Memory is impaired.     Comments: Impaired insight             Vital Signs: BP 96/64   Pulse (!) 135   Temp 97.6 F (36.4 C) (Oral)   Resp 20   Ht 5' 6"  (1.676 m)   Wt 66.3 kg   SpO2 98%   BMI 23.59 kg/m  SpO2: SpO2: 98 % O2 Device: O2 Device: Nasal Cannula O2 Flow Rate: O2 Flow Rate (L/min): 4 L/min   LBM: Last BM Date :  (PTA)     Palliative Medicine Assessment & Plan   Assessment: Principal Problem:   Hyperkalemia Active Problems:   Atrial fibrillation with rapid ventricular response (HCC)   AKI (acute kidney injury) (HCC)   Dehydration   Thrombocytopenia (HCC)   Bilateral pleural effusion   Prolonged QT interval   Lactic acidosis   Malnutrition of moderate degree   Biventricular failure (HCC)   Cardiogenic shock (HCC)   Pressure injury of skin    Recommendations/Plan: DNR/DNI as previously documented Continue all supportive care for now No escalation of care Please allow for liberalized visitation No further labs Tentative plan is to turn all infusions off Tuesday 5/23 PMT will continue to follow  Symptom Management:  Dilaudid 0.5-1 mg IV every 3 hours prn for pain or dyspnea Lorazepam (ATIVAN) 1-2 mg IV every 4 hours prn for anxiety  Prognosis: Very poor  Discharge Planning: Anticipated Hospital Death    Thank you for allowing the Palliative Medicine Team to assist in the care of this patient.  Greater than 50%  of this time was spent counseling and coordinating care related to the above assessment and plan.  Total time: 79 minutes   Lavena Bullion, NP Palliative Medicine   Please contact Palliative Medicine Team phone at [phone] for questions and concerns.  For individual provider, see AMION.

## 2022-01-26 NOTE — Progress Notes (Addendum)
Patient ID: [name], female   DOB: November 20, 1933, 88 y.o.   MRN: [id number]      Advanced Heart Failure Rounding Note  PCP-Cardiologist: [name], MD   Subjective:    Patient is on milrinone 0.125 and NE 6 with MAP 70s.  Co-ox 60% this morning, CVP 10.  UOP 1075 yesterday.   HR still 120s-130s in atrial fibrillation on amiodarone 60 mg/hr and heparin gtt.  Creatinine 2.61 => 2.51 => 2.46.  Afebrile, WBCs normal.  Blood cultures NGTD.   No complaints, denies dyspnea.    Objective:   Weight Range: 66.3 kg Body mass index is 23.59 kg/m.   Vital Signs:   Temp:  [97.2 F (36.2 C)-97.9 F (36.6 C)] 97.4 F (36.3 C) (05/21 0400) Pulse Rate:  [92-148] 132 (05/21 0600) Resp:  [17-34] 24 (05/21 0600) SpO2:  [87 %-99 %] 96 % (05/21 0600) Arterial Line BP: (77-99)/(50-70) 96/65 (05/21 0600) Weight:  [66.3 kg] 66.3 kg (05/21 0500) Last BM Date :  (PTA)  Weight change: Filed Weights   01/07/2022 2212 01/25/22 0500 01/26/22 0500  Weight: 62.4 kg 64.1 kg 66.3 kg    Intake/Output:   Intake/Output Summary (Last 24 hours) at 01/26/2022 0714 Last data filed at 01/26/2022 0600 Gross per 24 hour  Intake 1585.26 ml  Output 1075 ml  Net 510.26 ml      Physical Exam    General: NAD, frail Neck: JVP 8-9 cm, no thyromegaly or thyroid nodule.  Lungs: Clear to auscultation bilaterally with normal respiratory effort. CV: Nondisplaced PMI.  Heart tachy, irregular S1/S2, no S3/S4, no murmur.  No peripheral edema.   Abdomen: Soft, nontender, no hepatosplenomegaly, no distention.  Skin: Intact without lesions or rashes.  Neurologic: Alert and oriented x 3.  Psych: Normal affect. Extremities: No clubbing or cyanosis.  HEENT: Normal.   Telemetry   Atrial fibrillation with RVR (personally reviewed)   Labs    CBC Recent Labs    01/25/22 0533 01/26/22 0419  WBC 8.1 8.2  HGB 12.5 12.0  HCT 36.2 34.4*  MCV 82.3 81.3  PLT 104* PLATELET CLUMPS NOTED ON SMEAR, UNABLE TO  ESTIMATE   Basic Metabolic Panel Recent Labs    01/24/22 0306 01/24/22 1740 01/25/22 0533 01/26/22 0419  NA 133*   < > 130* 129*  K 5.5*   < > 4.7 4.7  CL 102   < > 101 99  CO2 19*   < > 18* 20*  GLUCOSE 136*   < > 145* 130*  BUN 91*   < > 88* 85*  CREATININE 2.66*   < > 2.52* 2.46*  CALCIUM 8.7*   < > 8.3* 8.2*  MG 3.0*  --  2.8*  --   PHOS  --   --  4.5  --    < > = values in this interval not displayed.   Liver Function Tests Recent Labs    01/24/22 0306 01/25/22 0533  AST 82* 65*  ALT 97* 93*  ALKPHOS 336* 341*  BILITOT 1.5* 1.0  PROT 6.1* 5.4*  ALBUMIN 2.8* 2.3*   No results for input(s): LIPASE, AMYLASE in the last 72 hours.  Cardiac Enzymes No results for input(s): CKTOTAL, CKMB, CKMBINDEX, TROPONINI in the last 72 hours.  BNP: BNP (last 3 results) Recent Labs    12/12/21 0837  BNP 573.0*    ProBNP (last 3 results) No results for input(s): PROBNP in the last 8760 hours.   D-Dimer No results for input(s):  DDIMER in the last 72 hours. Hemoglobin A1C No results for input(s): HGBA1C in the last 72 hours. Fasting Lipid Panel No results for input(s): CHOL, HDL, LDLCALC, TRIG, CHOLHDL, LDLDIRECT in the last 72 hours. Thyroid Function Tests Recent Labs    01/24/22 0306  TSH 2.903    Other results:   Imaging    No results found.   Medications:     Scheduled Medications:  arformoterol  15 mcg Nebulization BID   budesonide (PULMICORT) nebulizer solution  0.5 mg Nebulization BID   Chlorhexidine Gluconate Cloth  6 each Topical Daily   docusate sodium  100 mg Oral BID   feeding supplement  237 mL Oral BID BM   furosemide  80 mg Intravenous Once   multivitamin with minerals  1 tablet Oral Daily   pantoprazole  40 mg Oral Daily   polyethylene glycol  17 g Oral Daily   revefenacin  175 mcg Nebulization Daily    Infusions:  sodium chloride Stopped (01/24/22 0434)   sodium chloride     amiodarone 60 mg/hr (01/26/22 0608)   heparin 1,100  Units/hr (01/26/22 0600)   milrinone 0.125 mcg/kg/min (01/26/22 0600)   norepinephrine (LEVOPHED) Adult infusion 6 mcg/min (01/26/22 0600)    PRN Medications: Place/Maintain arterial line **AND** sodium chloride, acetaminophen, albuterol, diphenhydrAMINE-zinc acetate, hydrocortisone, ondansetron    Assessment/Plan   1. Cardiogenic shock: Echo this admission with EF < 20%, severely decreased RV function with normal RV size, severe biatrial enlargement, severe MS with mean gradient 10 and MVA 0.85 cm^2, moderate MR, IVC dilated.  Cause of cardiomyopathy uncertain, suspect nonischemic.  Initially hypotensive with lactate > 9, lactate decreased to normal.  Co-ox improved at 60% today.  She is on NE 6, milrinone 0.125 with CVP 10, 1075 cc UOP yesterday with creatinine lower at 2.45. MAP stable.  - Continue current milrinone 0.125 and wean NE as able.   - Will challenge again today with Lasix 80 mg IV x 1.  - Not candidate for advanced therapies with age and frailty.  2. AKI on CKD stage 3: Creatinine 2.66 => 2.52 => 2.45, suspect due to shock/cardiorenal.  UOP 1075 yesterday.  - I do not think she would be a candidate for RRT (would tolerate poorly).  - Support cardiac output.  3. Atrial fibrillation: Permanent.  Now with RVR 120s-130s.   - Continue amiodarone gtt 60 mg/hr.  - heparin gtt - Cardioversion unlikely to be successful given duration of AF.  4. RUE DVT: She is on heparin gtt.  5. COPD: H/o smoking. On 4L [place].  6. Mitral stenosis: Severe.  This has been known for several years.  Severely calcified MV.  Not candidate for valve surgery.  7. Hyperkalemia: Improved.  8. Thrombocytopenia: Mild, suspect due to critical illness and not HIT.  9. Elevated LFTs: Mild, likely from shock liver.  10. Hyponatremia: Suspect hypervolemic hyponatremia. Would fluid restrict.  11. Ongoing discussions with patient and family, she wants to remain full code.  Palliative care following, think prognosis is  poor.   CRITICAL CARE Performed by: Loralie Champagne  Total critical care time: 35 minutes  Critical care time was exclusive of separately billable procedures and treating other patients.  Critical care was necessary to treat or prevent imminent or life-threatening deterioration.  Critical care was time spent personally by me on the following activities: development of treatment plan with patient and/or surrogate as well as nursing, discussions with consultants, evaluation of patient's response to treatment, examination of patient,  obtaining history from patient or surrogate, ordering and performing treatments and interventions, ordering and review of laboratory studies, ordering and review of radiographic studies, pulse oximetry and re-evaluation of patient's condition.    Length of Stay: 4  Loralie Champagne, MD  01/26/2022, 7:14 AM  Advanced Heart Failure Team Pager [phone] (M-F; 7a - 5p)  Please contact [facility] for night-coverage after hours (5p -7a ) and weekends on [email or web address]

## 2022-01-27 DIAGNOSIS — I4891 Unspecified atrial fibrillation: Secondary | ICD-10-CM | POA: Diagnosis not present

## 2022-01-27 DIAGNOSIS — I5082 Biventricular heart failure: Secondary | ICD-10-CM | POA: Diagnosis not present

## 2022-01-27 DIAGNOSIS — E875 Hyperkalemia: Secondary | ICD-10-CM | POA: Diagnosis not present

## 2022-01-27 DIAGNOSIS — R570 Cardiogenic shock: Secondary | ICD-10-CM | POA: Diagnosis not present

## 2022-01-27 DIAGNOSIS — Z515 Encounter for palliative care: Secondary | ICD-10-CM

## 2022-01-27 DIAGNOSIS — Z7189 Other specified counseling: Secondary | ICD-10-CM

## 2022-01-27 DIAGNOSIS — N179 Acute kidney failure, unspecified: Secondary | ICD-10-CM | POA: Diagnosis not present

## 2022-01-27 LAB — CULTURE, BLOOD (ROUTINE X 2)
Culture: NO GROWTH
Culture: NO GROWTH
Special Requests: ADEQUATE

## 2022-01-27 LAB — HEPARIN LEVEL (UNFRACTIONATED): Heparin Unfractionated: 0.34 IU/mL (ref 0.30–0.70)

## 2022-01-27 LAB — PATHOLOGIST SMEAR REVIEW

## 2022-01-27 MED ORDER — MIDODRINE HCL 5 MG PO TABS
5.0000 mg | ORAL_TABLET | Freq: Three times a day (TID) | ORAL | Status: DC
  Administered 2022-01-27 (×2): 5 mg via ORAL
  Filled 2022-01-27 (×2): qty 1

## 2022-01-27 MED ORDER — FUROSEMIDE 10 MG/ML IJ SOLN
80.0000 mg | Freq: Once | INTRAMUSCULAR | Status: AC
  Administered 2022-01-27: 80 mg via INTRAVENOUS
  Filled 2022-01-27: qty 8

## 2022-01-27 NOTE — Progress Notes (Signed)
PT Cancellation Note  Patient Details Name: ANIYLA HARLING MRN: [id number] DOB: 17-Jun-1934   Cancelled Treatment:    Reason Eval/Treat Not Completed: PT screened, no needs identified, will sign off Pt is going to be going comfort so signing off from a PT stand point. Please re-consult if anything changes. Thanks.   Marguarite Arbour A Clarabel Marion 01/27/2022, 9:44 AM Marisa Severin, PT, DPT Acute Rehabilitation Services Secure chat preferred Office [phone]

## 2022-01-27 NOTE — Progress Notes (Signed)
Palliative Medicine Progress Note   Patient Name: [name]       Date: 01/27/2022 DOB: [birth year]  Age: 88 y.o. MRN#: [id number] Attending Physician: Candee Furbish, MD Primary Care Physician: Redmond School, MD Admit Date: 01/13/2022   HPI/Patient Profile: 88 y.o. female  with past medical history of atrial fibrillation on warfarin, COPD, mitral regurgitation, OSA, and nonischemic cardiomyopathy.  She presented to [facility] ED on 01/27/2022 with complaints of generalized weakness x 2 to 3 days.  Also complained of occasional shortness of breath.  She was found to be tachypneic, tachycardic, and hypertensive.  Labs significant for elevated creatinine, transaminitis, and elevated lactic acid.  She was initially admitted to the hospitalist service with acute renal failure, lactic acidosis, and bilateral pleural effusions.  Echocardiogram showed new reduction of biventricular function and severe biatrial dilation.  Central line was placed and she was started on norepinephrine and milrinone infusions.  Course complicated by acute thrombosis of left versus right (unclear) radial vein and superficial thrombophlebitis of the right basilar vein.  She was transferred to [facility] for ICU admission and further management of cardiogenic shock.  Subjective: Chart reviewed and update received from bedside RN.  No acute concerns.  She is on Levophed at 10 mcg.  On my assessment, patient appears comfortable and denies pain or shortness of breath.  She is looking forward to visiting with her sister later today.  Regarding patient's capacity - she was unable to recall our discussion from yesterday.  She does not seem to have capacity to make complex medical decisions  15:00 - I spoke with his niece by phone.  She  confirms that patient's sister should be arriving from [place] this evening.  We discussed tentative plans for transitioning to comfort care.  Her niece wishes to allow her some time with her sister on Tuesday, and then transition to comfort on Wednesday.  We discussed that [facility] does not have full capacity at this point; her niece fully agrees and feels it would cause unnecessary distress to tell Imanni that she cannot go home.  Plan to transition to comfort care on Wednesday 5/24 was discussed with PCCM Noe Gens, NP.    Objective:  Physical Exam Vitals reviewed.  Constitutional:      Appearance: She is ill-appearing.  Cardiovascular:  Rate and Rhythm: Tachycardia present.  Pulmonary:     Effort: Pulmonary effort is normal.  Neurological:     Mental Status: She is alert.     Motor: Weakness present.  Psychiatric:        Cognition and Memory: Memory is impaired.            Vital Signs: BP 100/62 (BP Location: Right Arm)   Pulse (!) 133   Temp (!) 96.8 F (36 C) (Axillary)   Resp 12   Ht '5\' 6"'$  (1.676 m)   Wt 66.1 kg   SpO2 (!) 89%   BMI 23.52 kg/m  SpO2: SpO2: (!) 89 % O2 Device: O2 Device: Nasal Cannula O2 Flow Rate: O2 Flow Rate (L/min): 4 L/min   LBM: Last BM Date : 01/26/22    Palliative Medicine Assessment & Plan   Assessment: Principal Problem:   Hyperkalemia Active Problems:   Atrial fibrillation with rapid ventricular response (HCC)   AKI (acute kidney injury) (HCC)   Dehydration   Thrombocytopenia (HCC)   Bilateral pleural effusion   Prolonged QT interval   Lactic acidosis   Malnutrition of moderate degree   Biventricular failure (HCC)   Cardiogenic shock (HCC)   Pressure injury of skin    Recommendations/Plan: Continue all supportive care for now No escalation of care Please allow for liberalized visitation No further labs Plan to transition to comfort care on Wednesday 5/24 PMT will continue to support   Symptom Management:   Dilaudid 0.5-1 mg IV every 3 hours prn for pain or dyspnea Lorazepam (ATIVAN) 1-2 mg IV every 4 hours prn for anxiety  Code Status: DNR/DNI as previously documented   Prognosis: Very poor  Discharge Planning: Anticipated Hospital Death   Thank you for allowing the Palliative Medicine Team to assist in the care of this patient.   MDM - High   Lavena Bullion, NP   Please contact Palliative Medicine Team phone at [phone] for questions and concerns.  For individual providers, please see AMION.

## 2022-01-27 NOTE — Progress Notes (Signed)
ANTICOAGULATION CONSULT NOTE  Pharmacy Consult for heparin Indication: atrial fibrillation/UE thrombus  Allergies  Allergen Reactions   Amoxicillin-Pot Clavulanate Itching and Swelling   Clarithromycin Itching and Swelling   Clindamycin Itching and Swelling   Doxycycline Itching and Swelling   Penicillins Itching and Swelling    Did it involve swelling of the face/tongue/throat, SOB, or low BP? Yes Did it involve sudden or severe rash/hives, skin peeling, or any reaction on the inside of your mouth or nose? No Did you need to seek medical attention at a hospital or doctor's office? Yes When did it last happen? Over 10 years     If all above answers are "NO", may proceed with cephalosporin use.     Patient Measurements: Height: '5\' 6"'$  (167.6 cm) Weight: 66.1 kg (145 lb 11.6 oz) IBW/kg (Calculated) : 59.3  Vital Signs: Temp: 96.8 F (36 C) (05/22 0800) Temp Source: Axillary (05/22 0800) BP: 100/62 (05/22 0800) Pulse Rate: 133 (05/22 1000)  Labs: Recent Labs    01/24/22 1740 01/24/22 2201 01/25/22 0533 01/25/22 1038 01/26/22 0419 01/26/22 0423 01/27/22 0743  HGB  --   --  12.5  --  12.0  --   --   HCT  --   --  36.2  --  34.4*  --   --   PLT  --   --  104*  --  PLATELET CLUMPS NOTED ON SMEAR, UNABLE TO ESTIMATE  --   --   LABPROT  --   --  29.3*  --  25.2*  --   --   INR  --   --  2.8*  --  2.3*  --   --   HEPARINUNFRC  --    < >  --  0.26*  --  0.29* 0.34  CREATININE 2.61*  --  2.52*  --  2.46*  --   --    < > = values in this interval not displayed.     Estimated Creatinine Clearance: 15.7 mL/min (A) (by C-G formula based on SCr of 2.46 mg/dL (H)).   Medical History: Past Medical History:  Diagnosis Date   Asthma    Atrial fibrillation (HCC)    Chronic rhinitis    COPD (chronic obstructive pulmonary disease) (HCC)    GERD (gastroesophageal reflux disease)    Mitral regurgitation    Mitral valve prolapse    Nasal polyps    Nonischemic cardiomyopathy  (HCC)    LVEF 25-30% improved to 45-50%   Obstructive sleep apnea    Transudative pleural effusion    Assessment: 88 y.o. female with medical history significant of chronic atrial fibrillation, COPD, GERD who presents to the emergency department due to 2-3 day onset of generalized weakness. Patient with hyperkalemia and afib. Patient on chronic coumadin for afib. Home dose is '5mg'$  daily per med rec. Last anticoag visit in Nov 2022, patient taking '5mg'$  daily except 2.'5mg'$  on Sunday.  Pharmacy asked to start IV heparin with new UE thrombus. Heparin drip 1100 uts/hr heparin level 0.3 at goal with INR slowly trend down  INR 2.8 >2.3 last dose warfarin 5/18  LFTs elevated > down trending, holding warfarin now, no bleeding CBC stable   Goal of Therapy:  HL 0.3-0.7 units/ml Monitor platelets by anticoagulation protocol: Yes   Plan:  Continue heparin infusion  1100 units/hr Daily heparin level and INR Continue to monitor H&H and platelets     Bonnita Nasuti Pharm.D. CPP, BCPS Clinical Pharmacist [phone] 01/27/2022 11:53 AM

## 2022-01-27 NOTE — Progress Notes (Addendum)
Patient ID: LANESHIA PINA, female   DOB: April 28, 1934, 88 y.o.   MRN: [id number]      Advanced Heart Failure Rounding Note  PCP-Cardiologist: [name], MD   Subjective:    Remains on Milrinone 0.125 + NE 9.  1L in UOP yesterday w/ IV Lasix. CVP 10-11.   Remains in Afib 130s on amio gtt + heparin gtt.    No Labs drawn today.   Pt now DNR. Planning to continue gtts until family arrive to visit from [facility] later today. Plan to d/c gtts tomorrow.   Currently comfortable at rest. Sitting up in bed A&O. Eating breakfast. No distress. Denies dyspnea.    Objective:   Weight Range: 66.1 kg Body mass index is 23.52 kg/m.   Vital Signs:   Temp:  [96.3 F (35.7 C)-97.6 F (36.4 C)] 97.3 F (36.3 C) (05/22 0400) Pulse Rate:  [120-144] 127 (05/22 0600) Resp:  [16-26] 17 (05/22 0600) SpO2:  [91 %-98 %] 96 % (05/22 0600) Arterial Line BP: (76-111)/(49-80) 90/56 (05/22 0600) Weight:  [66.1 kg] 66.1 kg (05/22 0500) Last BM Date : 01/26/22  Weight change: Filed Weights   01/25/22 0500 01/26/22 0500 01/27/22 0500  Weight: 64.1 kg 66.3 kg 66.1 kg    Intake/Output:   Intake/Output Summary (Last 24 hours) at 01/27/2022 0713 Last data filed at 01/27/2022 0600 Gross per 24 hour  Intake 2013.55 ml  Output 1000 ml  Net 1013.55 ml      Physical Exam   CVP 10 General:  elderly frail F, sitting up in bed. No respiratory difficulty HEENT: normal Rt IJ CVC, JVD elevated. Carotids 2+ bilat; no bruits. No lymphadenopathy or thyromegaly appreciated. Cor: PMI nondisplaced. Irregularly irregular rhythm. No rubs, gallops or murmurs. Lungs: clear Abdomen: soft, nontender, nondistended. No hepatosplenomegaly. No bruits or masses. Good bowel sounds. Extremities: no cyanosis, clubbing, rash, trace-1+ b/l LEE edema Neuro: alert & oriented x 3, cranial nerves grossly intact. moves all 4 extremities w/o difficulty. Affect pleasant.    Telemetry   Atrial fibrillation with RVR 130s   (personally reviewed)   Labs    CBC Recent Labs    01/25/22 0533 01/26/22 0419  WBC 8.1 8.2  HGB 12.5 12.0  HCT 36.2 34.4*  MCV 82.3 81.3  PLT 104* PLATELET CLUMPS NOTED ON SMEAR, UNABLE TO ESTIMATE   Basic Metabolic Panel Recent Labs    01/25/22 0533 01/26/22 0419  NA 130* 129*  K 4.7 4.7  CL 101 99  CO2 18* 20*  GLUCOSE 145* 130*  BUN 88* 85*  CREATININE 2.52* 2.46*  CALCIUM 8.3* 8.2*  MG 2.8*  --   PHOS 4.5  --    Liver Function Tests Recent Labs    01/25/22 0533  AST 65*  ALT 93*  ALKPHOS 341*  BILITOT 1.0  PROT 5.4*  ALBUMIN 2.3*   No results for input(s): LIPASE, AMYLASE in the last 72 hours.  Cardiac Enzymes No results for input(s): CKTOTAL, CKMB, CKMBINDEX, TROPONINI in the last 72 hours.  BNP: BNP (last 3 results) Recent Labs    12/12/21 0837  BNP 573.0*    ProBNP (last 3 results) No results for input(s): PROBNP in the last 8760 hours.   D-Dimer No results for input(s): DDIMER in the last 72 hours. Hemoglobin A1C No results for input(s): HGBA1C in the last 72 hours. Fasting Lipid Panel No results for input(s): CHOL, HDL, LDLCALC, TRIG, CHOLHDL, LDLDIRECT in the last 72 hours. Thyroid Function Tests No results for input(s):  TSH, T4TOTAL, T3FREE, THYROIDAB in the last 72 hours.  Invalid input(s): FREET3   Other results:   Imaging    No results found.   Medications:     Scheduled Medications:  arformoterol  15 mcg Nebulization BID   budesonide (PULMICORT) nebulizer solution  0.5 mg Nebulization BID   Chlorhexidine Gluconate Cloth  6 each Topical Daily   docusate sodium  100 mg Oral BID   pantoprazole  40 mg Oral Daily   polyethylene glycol  17 g Oral Daily   revefenacin  175 mcg Nebulization Daily    Infusions:  sodium chloride Stopped (01/24/22 0434)   sodium chloride     amiodarone 60 mg/hr (01/27/22 0600)   heparin 1,100 Units/hr (01/27/22 0600)   milrinone 0.125 mcg/kg/min (01/27/22 0600)   norepinephrine  (LEVOPHED) Adult infusion 9 mcg/min (01/27/22 0600)    PRN Medications: Place/Maintain arterial line **AND** sodium chloride, acetaminophen, albuterol, diphenhydrAMINE-zinc acetate, hydrocortisone, HYDROmorphone (DILAUDID) injection, LORazepam, ondansetron    Assessment/Plan   1. Cardiogenic shock: Echo this admission with EF < 20%, severely decreased RV function with normal RV size, severe biatrial enlargement, severe MS with mean gradient 10 and MVA 0.85 cm^2, moderate MR, IVC dilated.  Cause of cardiomyopathy uncertain, suspect nonischemic.  Initially hypotensive with lactate > 9, lactate decreased to normal.  She is on NE 9, milrinone 0.125 with CVP 10. No labs drawn today. Planning to transition to comfort care after family arrives to visit from [facility]. Palliative care team following.  - Continue current milrinone 0.125 and NE to maintain MAP - Not candidate for advanced therapies with age and frailty.  2. AKI on CKD stage 3: Creatinine 2.66 => 2.52 => 2.45, suspect due to shock/cardiorenal.  UOP 1000 yesterday.  - I do not think she would be a candidate for RRT (would tolerate poorly).  - Support cardiac output.  3. Atrial fibrillation: Permanent.  Now with RVR 120s-130s.   - Continue amiodarone gtt 60 mg/hr.  - heparin gtt - Cardioversion unlikely to be successful given duration of AF.  4. RUE DVT: She is on heparin gtt.  5. COPD: H/o smoking. On 4L [place].  6. Mitral stenosis: Severe.  This has been known for several years.  Severely calcified MV.  Not candidate for valve surgery.  7. Hyperkalemia: Improved.  8. Thrombocytopenia: Mild, suspect due to critical illness and not HIT.  9. Elevated LFTs: Mild, likely from shock liver.  10. Hyponatremia: Suspect hypervolemic hyponatremia.   11. DNR Palliative care following. Sister should arrive from [place] around 5 PM today.  We will tentatively plan to wean off all infusions Tuesday 5/23.  Length of Stay: [address], PA-C   01/27/2022, 7:13 AM  Advanced Heart Failure Team Pager [phone] (M-F; 7a - 5p)  Please contact [facility] for night-coverage after hours (5p -7a ) and weekends on [email or web address]  Patient seen with PA, agree with the above note.   Family meeting with palliative care and CCM yesterday afternoon, now DNR with plans to move to hospice/comfort care when family arrives from [place] tomorrow.   Currently unchanged clinically on NE 9 and milrinone 0.125. HR 120s-130s on amiodarone gtt and heparin gtt.  No labs were drawn today. CVP 13.   She is comfortable and eating breakfast.   General: NAD, frail Neck: No JVD, no thyromegaly or thyroid nodule.  Lungs: Clear to auscultation bilaterally with normal respiratory effort. CV: Nondisplaced PMI.  Heart tachy, irregular S1/S2, no S3/S4, no murmur.  No  peripheral edema.   Abdomen: Soft, nontender, no hepatosplenomegaly, no distention.  Skin: Intact without lesions or rashes.  Neurologic: Alert and oriented x 3.  Psych: Normal affect. Extremities: No clubbing or cyanosis.  HEENT: Normal.   Decision made to proceed to comfort care when family arrives from [place] tomorrow.  Will continue current drips, then stop when family arrives.  In-hospital hospital death most likely, if not will proceed with residential hospice situation.   Will give 1 dose of Lasix 80 mg IV today.   Loralie Champagne 01/27/2022 7:40 AM

## 2022-01-27 NOTE — Progress Notes (Signed)
NAME:  [name], MRN:  [id number], DOB:  August 30, 1934, LOS: 5 ADMISSION DATE:  01/12/2022, CONSULTATION DATE:  5/19 REFERRING MD:  Dr. Aundra Dubin, CHIEF COMPLAINT:  cardiogenic shock   History of Present Illness:  88 year old female with PMH as below, which is significant for persistent atrial fibrillation on warfarin, COPD, mitral regurgitation, OSA, and non-ischemic cardiomyopathy. She presented to [facility] ED 5/18 with complaints of generalized weakness x 2-3 days. Also complained of occasional shortness of breath. She was found to be tachypneic and tachycardic. Also hypotensive to 91/75. Workup also significant for Renal failure, transaminitis, and elevated lactic acid. She was admitted to the hospitalist service with renal failure, lactic acidosis , and bilateral moderate pleural effusions. Workup included echocardiogram, which was significant for new reduction of biventricular function and severe bi-atrial dilation. Diagnosed with cardiogenic shock. Central line was placed and she was started on norepinephrine and milrinone infusions. Course complicated by acute thrombosis of L vs R (unclear) radial vein and superficial thrombophlebitis of the rt basilic vein. Started on heparin infusion. She was transferred [facility] for ICU admission and heart failure specialist evaluation.   Pertinent  Medical History   has a past medical history of Asthma, Atrial fibrillation ([place]), Chronic rhinitis, COPD (chronic obstructive pulmonary disease) ([place]), GERD (gastroesophageal reflux disease), Mitral regurgitation, Mitral valve prolapse, Nasal polyps, Nonischemic cardiomyopathy ([place]), Obstructive sleep apnea, and Transudative pleural effusion.   Significant Hospital Events: Including procedures, antibiotic start and stop dates in addition to other pertinent events   5/18 admit to [facility], Cardiogenic shock 5/19 transfer to [facility] for ICU, Heart failure eval.  5/21 pt wanting to go home, positive  6L   Interim History / Subjective:  Afebrile  I/O 1L UOP, +1L in last 24 hours / overall 7.8L positive  4L [place]  Pt states she is worried, would like to go home if possible   Objective   Blood pressure 96/64, pulse (!) 127, temperature (!) 96.6 F (35.9 C), temperature source Axillary, resp. rate 17, height '5\' 6"'$  (1.676 m), weight 66.1 kg, SpO2 94 %. CVP:  [8 mmHg-16 mmHg] 8 mmHg      Intake/Output Summary (Last 24 hours) at 01/27/2022 0933 Last data filed at 01/27/2022 0600 Gross per 24 hour  Intake 1755.25 ml  Output 900 ml  Net 855.25 ml   Filed Weights   01/25/22 0500 01/26/22 0500 01/27/22 0500  Weight: 64.1 kg 66.3 kg 66.1 kg    Examination: General: frail elderly adult female lying in bed in NAD HEENT: MM pink/moist, anicteric, [place] O2 Neuro: awake/alert, speech clear, oriented to self/place CV: s1s2 s1s2 irr irr, no m/r/g PULM: non-labored, clear anterior, bilateral crackles posteriorly  GI: soft, bsx4 active  Extremities: warm/dry, 2+ generalized edema  Skin: thin / fragile skin  Resolved Hospital Problem list     Assessment & Plan:   Cardiogenic shock Secondary to biventricular heart failure. Echocardiogram 5/18 showed LVEF less than 20% as well as severely reduced RV function. Severe mitral stenosis.  -per Advanced Heart Failure team  -continue levophed, milrinone  -not a candidate for advanced therapies  -follow Co-Ox, CVP  -diuresis per CHF team  -current plan is to stop therapies on 5/23 once family has visited  -DNR  Atrial fibrillation with RVR Persistent AF at baseline. Currently AF rate 150. Anticoagulated with warfarin chronically.   -amiodarone infusion  -continue heparin per pharmacy  -tele monitoring   AKI on CKD -Trend BMP / urinary output -Replace  electrolytes as indicated -Avoid nephrotoxic agents, ensure adequate renal perfusion -not a candidate for HD   Bilateral pleural effusions R>L, suspect related to heart failure and MR.  -  breathing relatively comfortably. No role for thoracentesis at this time. Should improve with HF treatment.   COPD/Asthma -continue pulmicort, brovana, yupelri  -hold home Breztri  Right radial arterial thrombus -heparin infusion    Best Practice (right click and "Reselect all SmartList Selections" daily)  Diet/type: Regular consistency (see orders) and NPO DVT prophylaxis: systemic heparin GI prophylaxis: PPI Lines: Central line and Arterial Line Foley:  Yes, and it is still needed Code Status:  full code Last date of multidisciplinary goals of care discussion: 5/21 - see Palliative Care discussion.  Anticipated possible cessation of vasopressors etc 5/23 pending family visitation.  Patient updated on plan of care 5/22. Support offered.   Critical care time: 30 minutes    Noe Gens, MSN, APRN, NP-C, AGACNP-BC [facility] 01/27/2022, 9:33 AM   Please see [email or web address] for pager details.   From 7A-7P if no response, please call [phone] After hours, please call ELink [phone]

## 2022-01-28 DIAGNOSIS — R570 Cardiogenic shock: Secondary | ICD-10-CM | POA: Diagnosis not present

## 2022-01-28 DIAGNOSIS — E875 Hyperkalemia: Secondary | ICD-10-CM | POA: Diagnosis not present

## 2022-01-28 DIAGNOSIS — I4891 Unspecified atrial fibrillation: Secondary | ICD-10-CM | POA: Diagnosis not present

## 2022-01-28 LAB — HEPARIN LEVEL (UNFRACTIONATED): Heparin Unfractionated: 0.31 IU/mL (ref 0.30–0.70)

## 2022-01-28 LAB — PROTIME-INR
INR: 1.6 — ABNORMAL HIGH (ref 0.8–1.2)
Prothrombin Time: 19.2 seconds — ABNORMAL HIGH (ref 11.4–15.2)

## 2022-01-28 MED ORDER — PANTOPRAZOLE SODIUM 40 MG IV SOLR
40.0000 mg | INTRAVENOUS | Status: DC
Start: 2022-01-28 — End: 2022-01-29
  Administered 2022-01-28: 40 mg via INTRAVENOUS
  Filled 2022-01-28: qty 10

## 2022-01-28 MED ORDER — NOREPINEPHRINE 16 MG/250ML-% IV SOLN
0.0000 ug/min | INTRAVENOUS | Status: DC
  Administered 2022-01-28: 13 ug/min via INTRAVENOUS
  Filled 2022-01-28: qty 250

## 2022-01-28 NOTE — Progress Notes (Signed)
Nutrition Brief Note  Chart reviewed. Pt to transition to comfort care tomorrow.  Currently on Regular diet  No further nutrition interventions planned at this time.  Please re-consult as needed.   Kerman Passey MS, RDN, LDN, CNSC Registered Dietitian III Clinical Nutrition RD Pager and On-Call Pager Number Located in [place]

## 2022-01-28 NOTE — Progress Notes (Addendum)
Patient ID: [name], female   DOB: [birth year], 88 y.o.   MRN: [id number]      Advanced Heart Failure Rounding Note  PCP-Cardiologist: [name], MD   Subjective:    Per palliative care team notes, patient's sister was expected to arrive yesterday evening. Plan is to visit today and transition patient to comfort care tomorrow.   Currently resting comfortably. Sleeping but will awake and follow commands. On 6 L [place], Milrinone 0.125 + NE 10. MAPs low 60s.   On amio gtt + heparin. In afib low 100s.   Only 750 cc UOP yesterday w/ IV Lasix. Wt up 5 lb.   Comfortable on current level of O2 support. Denies pain.    Objective:   Weight Range: 68.2 kg Body mass index is 24.27 kg/m.   Vital Signs:   Temp:  [96.4 F (35.8 C)-97.6 F (36.4 C)] 97.4 F (36.3 C) (05/23 0400) Pulse Rate:  [105-142] 115 (05/23 0700) Resp:  [8-29] 13 (05/23 0700) BP: (75-104)/(47-74) 90/72 (05/23 0600) SpO2:  [72 %-100 %] 100 % (05/23 0700) Arterial Line BP: (81-107)/(47-66) 85/51 (05/23 0700) Weight:  [68.2 kg] 68.2 kg (05/23 0500) Last BM Date : 01/26/22  Weight change: Filed Weights   01/26/22 0500 01/27/22 0500 01/28/22 0500  Weight: 66.3 kg 66.1 kg 68.2 kg    Intake/Output:   Intake/Output Summary (Last 24 hours) at 01/28/2022 0727 Last data filed at 01/28/2022 0700 Gross per 24 hour  Intake 2087.08 ml  Output 750 ml  Net 1337.08 ml      Physical Exam   General:  elderly and frail female. No respiratory difficulty HEENT: normal Neck: supple. JVD elevated to jaw. Carotids 2+ bilat; no bruits. No lymphadenopathy or thyromegaly appreciated. Cor: PMI nondisplaced. Irregularly irregular rhythm and rate, 2/6 MS murmur Lungs: clear Abdomen: soft, nontender, nondistended. No hepatosplenomegaly. No bruits or masses. Good bowel sounds. Extremities: no cyanosis, clubbing, rash, 1+ b/l LE edema Neuro: alert & oriented x 3, cranial nerves grossly intact. moves all 4 extremities w/o  difficulty. Affect pleasant.   Telemetry   Atrial fibrillation w/ RVR 110  (personally reviewed)  Labs    CBC Recent Labs    01/26/22 0419  WBC 8.2  HGB 12.0  HCT 34.4*  MCV 81.3  PLT PLATELET CLUMPS NOTED ON SMEAR, UNABLE TO ESTIMATE   Basic Metabolic Panel Recent Labs    01/26/22 0419  NA 129*  K 4.7  CL 99  CO2 20*  GLUCOSE 130*  BUN 85*  CREATININE 2.46*  CALCIUM 8.2*   Liver Function Tests No results for input(s): AST, ALT, ALKPHOS, BILITOT, PROT, ALBUMIN in the last 72 hours.  No results for input(s): LIPASE, AMYLASE in the last 72 hours.  Cardiac Enzymes No results for input(s): CKTOTAL, CKMB, CKMBINDEX, TROPONINI in the last 72 hours.  BNP: BNP (last 3 results) Recent Labs    12/12/21 0837  BNP 573.0*    ProBNP (last 3 results) No results for input(s): PROBNP in the last 8760 hours.   D-Dimer No results for input(s): DDIMER in the last 72 hours. Hemoglobin A1C No results for input(s): HGBA1C in the last 72 hours. Fasting Lipid Panel No results for input(s): CHOL, HDL, LDLCALC, TRIG, CHOLHDL, LDLDIRECT in the last 72 hours. Thyroid Function Tests No results for input(s): TSH, T4TOTAL, T3FREE, THYROIDAB in the last 72 hours.  Invalid input(s): FREET3   Other results:   Imaging    No results found.   Medications:  Scheduled Medications:  arformoterol  15 mcg Nebulization BID   budesonide (PULMICORT) nebulizer solution  0.5 mg Nebulization BID   Chlorhexidine Gluconate Cloth  6 each Topical Daily   docusate sodium  100 mg Oral BID   midodrine  5 mg Oral TID WC   pantoprazole  40 mg Oral Daily   polyethylene glycol  17 g Oral Daily   revefenacin  175 mcg Nebulization Daily    Infusions:  sodium chloride Stopped (01/24/22 0434)   sodium chloride     amiodarone 60 mg/hr (01/28/22 0700)   heparin 1,100 Units/hr (01/28/22 0700)   milrinone 0.125 mcg/kg/min (01/28/22 0700)   norepinephrine (LEVOPHED) Adult infusion 8  mcg/min (01/28/22 0700)    PRN Medications: Place/Maintain arterial line **AND** sodium chloride, acetaminophen, albuterol, diphenhydrAMINE-zinc acetate, hydrocortisone, HYDROmorphone (DILAUDID) injection, LORazepam, ondansetron    Assessment/Plan   1. Cardiogenic shock: Echo this admission with EF < 20%, severely decreased RV function with normal RV size, severe biatrial enlargement, severe MS with mean gradient 10 and MVA 0.85 cm^2, moderate MR, IVC dilated.  Cause of cardiomyopathy uncertain, suspect nonischemic.  Initially hypotensive with lactate > 9, lactate decreased to normal.  She is on NE 10 + milrinone 0.125 and c/w MSOF. Not candidate for advanced therapies with age and frailty. Planning to transition to comfort care on 5/24 after family arrives to visit from [facility]. Palliative care team following.  - continue current gtts for now to allow for family visit  2. AKI on CKD stage 3: Creatinine 2.66 => 2.52 => 2.45, suspect due to shock/cardiorenal.   - I do not think she would be a candidate for RRT (would tolerate poorly).  - per above, transitioning to comfort care  3. Atrial fibrillation: Permanent.  Now with RVR 110s.   - Continue amiodarone gtt 60 mg/hr.  - heparin gtt - Cardioversion unlikely to be successful given duration of AF.  4. RUE DVT: She is on heparin gtt.  5. COPD: H/o smoking. On 4L [place].  6. Mitral stenosis: Severe.  This has been known for several years.  Severely calcified MV.  Not candidate for valve surgery.  7. Hyperkalemia: Improved.  8. Thrombocytopenia: Mild, suspect due to critical illness and not HIT.  9. Elevated LFTs: Mild, likely from shock liver.  10. Hyponatremia: Suspect hypervolemic hyponatremia.   11. DNR Palliative care following. Family planning to visit today.  We will tentatively plan to wean off all infusions Wed 5/24.  Length of Stay: [address] [place]  01/28/2022, 7:27 AM  Advanced Heart Failure Team Pager [phone] (M-F; 7a -  5p)  Please contact [facility] for night-coverage after hours (5p -7a ) and weekends on [email or web address]  Patient seen with PA, agree with the above note.    Family meeting with palliative care, now DNR with plans to move to hospice/comfort care tomorrow after family has had time to visit with her.    Currently MAP 60s on NE 8 and milrinone 0.125. HR lower in 100s on amiodarone gtt and heparin gtt.  No labs were drawn today.    Sleeping.    General: NAD, frail Neck: No JVD, no thyromegaly or thyroid nodule.  Lungs: Clear to auscultation bilaterally with normal respiratory effort. CV: Nondisplaced PMI.  Heart tachy, irregular S1/S2, no S3/S4, no murmur.  No peripheral edema.   Abdomen: Soft, nontender, no hepatosplenomegaly, no distention.  Skin: Intact without lesions or rashes.  Neurologic: Alert and oriented x 3.  Psych: Normal affect. Extremities:  No clubbing or cyanosis.  HEENT: Normal.    Decision made to proceed to comfort care after family has time to visit with her.  Will continue current drips without escalation for today, transition to full comfort December 04, 2022.  In-hospital hospital death most likely, if not will proceed with residential hospice situation.   Loralie Champagne 01/28/2022 8:00 AM

## 2022-01-28 NOTE — Progress Notes (Signed)
NAME:  [name], MRN:  [id number], DOB:  05-25-1934, LOS: 6 ADMISSION DATE:  01/11/2022, CONSULTATION DATE:  5/19 REFERRING MD:  Dr. Aundra Dubin, CHIEF COMPLAINT:  cardiogenic shock   History of Present Illness:  88 year old female with PMH as below, which is significant for persistent atrial fibrillation on warfarin, COPD, mitral regurgitation, OSA, and non-ischemic cardiomyopathy. She presented to [facility] ED 5/18 with complaints of generalized weakness x 2-3 days. Also complained of occasional shortness of breath. She was found to be tachypneic and tachycardic. Also hypotensive to 91/75. Workup also significant for Renal failure, transaminitis, and elevated lactic acid. She was admitted to the hospitalist service with renal failure, lactic acidosis , and bilateral moderate pleural effusions. Workup included echocardiogram, which was significant for new reduction of biventricular function and severe bi-atrial dilation. Diagnosed with cardiogenic shock. Central line was placed and she was started on norepinephrine and milrinone infusions. Course complicated by acute thrombosis of L vs R (unclear) radial vein and superficial thrombophlebitis of the rt basilic vein. Started on heparin infusion. She was transferred [facility] for ICU admission and heart failure specialist evaluation.   Pertinent  Medical History   has a past medical history of Asthma, Atrial fibrillation ([place]), Chronic rhinitis, COPD (chronic obstructive pulmonary disease) ([place]), GERD (gastroesophageal reflux disease), Mitral regurgitation, Mitral valve prolapse, Nasal polyps, Nonischemic cardiomyopathy ([place]), Obstructive sleep apnea, and Transudative pleural effusion.   Significant Hospital Events: Including procedures, antibiotic start and stop dates in addition to other pertinent events   5/18 admit to [facility], Cardiogenic shock 5/19 transfer to [facility] for ICU, Heart failure eval.  5/21 pt wanting to go home, positive  6L  5/22 remains on pressors/inotropes, positive balance, goals of care with plan for sister to visit 5/23, then potential cessation of aggressive support measures on 5/24  Interim History / Subjective:  Pt denies acute complaints Afebrile  On levophed/milrinone   Objective   Blood pressure (!) 80/57, pulse (!) 114, temperature (!) 97.4 F (36.3 C), temperature source Axillary, resp. rate 12, height '5\' 6"'$  (1.676 m), weight 68.2 kg, SpO2 97 %. CVP:  [8 mmHg-9 mmHg] 9 mmHg      Intake/Output Summary (Last 24 hours) at 01/28/2022 0804 Last data filed at 01/28/2022 0700 Gross per 24 hour  Intake 2087.08 ml  Output 750 ml  Net 1337.08 ml   Filed Weights   01/26/22 0500 01/27/22 0500 01/28/22 0500  Weight: 66.3 kg 66.1 kg 68.2 kg    Examination: General: frail elderly female sitting up in bed in NAD HEENT: MM pink/moist, anicteric, [place] O2  Neuro: Awake/alert, clear to self, place, asking to go home, MAE / generalized weakness  CV: s1s2 irr irr, no m/r/g PULM: non-labored, clear anterior, bilateral posterior crackles 2/3 way up GI: soft, bsx4 active  Extremities: warm/dry, generalize 2+ pitting edema  Skin: thin / fragile skin, left hand less discolored   Resolved Hospital Problem list     Assessment & Plan:   Cardiogenic shock Secondary to biventricular heart failure. Echocardiogram 5/18 showed LVEF less than 20% as well as severely reduced RV function. Severe mitral stenosis.  -per CHF team  -continue levophed, milrinone  -not a candidate for more advanced therapies  -continue current plan of care through 5/23, then family planning to de-escalate measures on 5/24  -DNR   Atrial fibrillation with RVR Persistent AF at baseline. Currently AF rate 150. Anticoagulated with warfarin chronically.   -continue amiodarone, heparin infusions -tele monitoring  AKI on CKD -not an HD candidate  -Trend BMP / urinary output -Replace electrolytes as indicated -Avoid nephrotoxic  agents, ensure adequate renal perfusion  Bilateral pleural effusions R>L, suspect related to heart failure and MR.  -no role for thora  -respiratory status comfortable, would use medications for dyspnea   COPD/Asthma -hold home breztri -pulmicort, brovana + yupelri   Right radial arterial thrombus -heparin infusion  -elevate hand, warm compress    Best Practice (right click and "Reselect all SmartList Selections" daily)  Diet/type: Regular consistency (see orders) and NPO DVT prophylaxis: systemic heparin GI prophylaxis: PPI Lines: Central line and Arterial Line Foley:  Yes, and it is still needed Code Status:  full code Last date of multidisciplinary goals of care discussion: 5/21 - see Palliative Care discussion.  Per family request, patients sister is visiting from [facility] 5/23 and will plan to transition to comfort measures on 5/24.  See Palliative Care notes.   Critical care time: 30 minutes    Noe Gens, MSN, APRN, NP-C, AGACNP-BC [facility] 01/28/2022, 8:04 AM   Please see [email or web address] for pager details.   From 7A-7P if no response, please call [phone] After hours, please call ELink [phone]

## 2022-01-28 NOTE — Progress Notes (Signed)
Patient SW:[name]      DOB: 12/11/1933      FTD:[id number]      Palliative Medicine Team    Subjective: Bedside symptom check completed. No family or visitors in room at time of visit, two RN's were bathing patient.     Physical exam: Patient resting in bed receiving a bath at time of visit. Breathing even and non-labored, no excessive secretions noted, nasal cannula applied. Patient without physical or non-verbal signs of pain or discomfort at this time.    Assessment and plan: Bedside RN without needs or concerns at this time. Patient denies pain or discomfort. Patient continues to be supported with multiple medication infusions, anticipate family to arrive today and tomorrow. This RN will attempt to round later in the day to visit with the patient. Will continue to follow for any changes or advances.    Thank you for allowing the Palliative Medicine Team to assist in the care of this patient.     Damian Leavell, MSN, RN Palliative Medicine Team Team Phone: [phone]  This phone is monitored 7a-7p, please reach out to attending physician outside of these hours for urgent needs.

## 2022-01-28 NOTE — Progress Notes (Signed)
ANTICOAGULATION CONSULT NOTE  Pharmacy Consult for heparin Indication: atrial fibrillation/UE thrombus  Allergies  Allergen Reactions   Amoxicillin-Pot Clavulanate Itching and Swelling   Clarithromycin Itching and Swelling   Clindamycin Itching and Swelling   Doxycycline Itching and Swelling   Penicillins Itching and Swelling    Did it involve swelling of the face/tongue/throat, SOB, or low BP? Yes Did it involve sudden or severe rash/hives, skin peeling, or any reaction on the inside of your mouth or nose? No Did you need to seek medical attention at a hospital or doctor's office? Yes When did it last happen? Over 10 years     If all above answers are "NO", may proceed with cephalosporin use.     Patient Measurements: Height: '5\' 6"'$  (167.6 cm) Weight: 68.2 kg (150 lb 5.7 oz) IBW/kg (Calculated) : 59.3  Vital Signs: Temp: 97.4 F (36.3 C) (05/23 0400) Temp Source: Axillary (05/23 0400) BP: 90/72 (05/23 0600) Pulse Rate: 115 (05/23 0700)  Labs: Recent Labs    01/25/22 1038 01/26/22 0419 01/26/22 0423 01/27/22 0743  HGB  --  12.0  --   --   HCT  --  34.4*  --   --   PLT  --  PLATELET CLUMPS NOTED ON SMEAR, UNABLE TO ESTIMATE  --   --   LABPROT  --  25.2*  --   --   INR  --  2.3*  --   --   HEPARINUNFRC 0.26*  --  0.29* 0.34  CREATININE  --  2.46*  --   --      Estimated Creatinine Clearance: 15.7 mL/min (A) (by C-G formula based on SCr of 2.46 mg/dL (H)).   Medical History: Past Medical History:  Diagnosis Date   Asthma    Atrial fibrillation (HCC)    Chronic rhinitis    COPD (chronic obstructive pulmonary disease) (HCC)    GERD (gastroesophageal reflux disease)    Mitral regurgitation    Mitral valve prolapse    Nasal polyps    Nonischemic cardiomyopathy (HCC)    LVEF 25-30% improved to 45-50%   Obstructive sleep apnea    Transudative pleural effusion    Assessment: 88 y.o. Cunningham with medical history significant of chronic atrial fibrillation, COPD,  GERD who presents to the emergency department due to 2-3 day onset of generalized weakness. Patient with hyperkalemia and afib. Patient on chronic coumadin for afib. Home dose is '5mg'$  daily per med rec. Last anticoag visit in Nov 2022, patient taking '5mg'$  daily except 2.'5mg'$  on Sunday.  Pharmacy asked to start IV heparin with new UE thrombus. Heparin drip 1100 uts/hr heparin level 0.3 at goal with INR slowly trend down to Emily.6 last dose of warfarin 5/18.    Goal of Therapy:  HL 0.3-0.7 units/ml Monitor platelets by anticoagulation protocol: Yes   Plan:  Continue heparin infusion  1100 units/hr Daily heparin level and INR Continue to monitor H&H and platelets  Erin Hearing PharmD., BCPS Clinical Pharmacist 01/28/2022 7:27 AM

## 2022-01-29 DIAGNOSIS — R570 Cardiogenic shock: Secondary | ICD-10-CM | POA: Diagnosis not present

## 2022-01-29 DIAGNOSIS — I4891 Unspecified atrial fibrillation: Secondary | ICD-10-CM | POA: Diagnosis not present

## 2022-01-29 LAB — PROTIME-INR
INR: 1.6 — ABNORMAL HIGH (ref 0.8–1.2)
Prothrombin Time: 19.1 seconds — ABNORMAL HIGH (ref 11.4–15.2)

## 2022-01-29 LAB — HEPARIN LEVEL (UNFRACTIONATED): Heparin Unfractionated: 0.27 IU/mL — ABNORMAL LOW (ref 0.30–0.70)

## 2022-01-29 MED ORDER — ACETAMINOPHEN 325 MG PO TABS: 650.0000 mg | ORAL_TABLET | Freq: Four times a day (QID) | ORAL | Status: DC | PRN

## 2022-01-29 MED ORDER — GLYCOPYRROLATE 0.2 MG/ML IJ SOLN
0.2000 mg | INTRAMUSCULAR | Status: DC | PRN
Start: 2022-01-29 — End: 2022-01-29

## 2022-01-29 MED ORDER — ONDANSETRON HCL 4 MG/2ML IJ SOLN
INTRAMUSCULAR | Status: AC
  Administered 2022-01-29: 4 mg
  Filled 2022-01-29: qty 2

## 2022-01-29 MED ORDER — GLYCOPYRROLATE 0.2 MG/ML IJ SOLN: 0.2000 mg | INTRAMUSCULAR | Status: DC | PRN

## 2022-01-29 MED ORDER — MORPHINE BOLUS VIA INFUSION: 5.0000 mg | INTRAVENOUS | Status: DC | PRN

## 2022-01-29 MED ORDER — SCOPOLAMINE 1 MG/3DAYS TD PT72
1.0000 | MEDICATED_PATCH | TRANSDERMAL | Status: DC
  Administered 2022-01-29: 1.5 mg via TRANSDERMAL
  Filled 2022-01-29: qty 1

## 2022-01-29 MED ORDER — POLYVINYL ALCOHOL 1.4 % OP SOLN: 1.0000 [drp] | Freq: Four times a day (QID) | OPHTHALMIC | Status: DC | PRN

## 2022-01-29 MED ORDER — MORPHINE SULFATE (PF) 2 MG/ML IV SOLN
2.0000 mg | INTRAVENOUS | Status: DC | PRN
  Filled 2022-01-29: qty 1

## 2022-01-29 MED ORDER — DEXTROSE 5 % IV SOLN: INTRAVENOUS | Status: DC

## 2022-01-29 MED ORDER — ACETAMINOPHEN 650 MG RE SUPP
650.0000 mg | Freq: Four times a day (QID) | RECTAL | Status: DC | PRN
Start: 2022-01-29 — End: 2022-01-29

## 2022-01-29 MED ORDER — DIPHENHYDRAMINE HCL 50 MG/ML IJ SOLN: 25.0000 mg | INTRAMUSCULAR | Status: DC | PRN

## 2022-01-29 MED ORDER — MORPHINE 100MG IN NS 100ML (1MG/ML) PREMIX INFUSION: 0.0000 mg/h | INTRAVENOUS | Status: DC

## 2022-01-29 MED ORDER — GLYCOPYRROLATE 1 MG PO TABS
1.0000 mg | ORAL_TABLET | ORAL | Status: DC | PRN
  Filled 2022-01-29: qty 1

## 2022-02-06 NOTE — Progress Notes (Signed)
Marlyce Huge, PA at bedside, approved discontinuing O2 sat alarm as the plan is to transition this pt. to comfort later today.

## 2022-02-06 NOTE — Progress Notes (Signed)
ANTICOAGULATION CONSULT NOTE  Pharmacy Consult for heparin Indication: atrial fibrillation/UE thrombus  Allergies  Allergen Reactions   Amoxicillin-Pot Clavulanate Itching and Swelling   Clarithromycin Itching and Swelling   Clindamycin Itching and Swelling   Doxycycline Itching and Swelling   Penicillins Itching and Swelling    Did it involve swelling of the face/tongue/throat, SOB, or low BP? Yes Did it involve sudden or severe rash/hives, skin peeling, or any reaction on the inside of your mouth or nose? No Did you need to seek medical attention at a hospital or doctor's office? Yes When did it last happen? Over 10 years     If all above answers are "NO", may proceed with cephalosporin use.     Patient Measurements: Height: '5\' 6"'$  (167.6 cm) Weight:  (unable to get weight, bed malfunction) IBW/kg (Calculated) : 59.3  Vital Signs: Temp: 97.9 F (36.6 C) (05/24 0333) Temp Source: Axillary (05/24 0333) BP: 80/49 (05/24 0400) Pulse Rate: 109 (05/24 0700)  Labs: Recent Labs    01/27/22 0743 01/28/22 0854 2022-02-24 0425  LABPROT  --  19.2* 19.1*  INR  --  1.6* 1.6*  HEPARINUNFRC 0.34 0.31 0.27*     Estimated Creatinine Clearance: 15.7 mL/min (A) (by C-G formula based on SCr of 2.46 mg/dL (H)).   Medical History: Past Medical History:  Diagnosis Date   Asthma    Atrial fibrillation (HCC)    Chronic rhinitis    COPD (chronic obstructive pulmonary disease) (HCC)    GERD (gastroesophageal reflux disease)    Mitral regurgitation    Mitral valve prolapse    Nasal polyps    Nonischemic cardiomyopathy (HCC)    LVEF 25-30% improved to 45-50%   Obstructive sleep apnea    Transudative pleural effusion    Assessment: 87 y.o. female with medical history significant of chronic atrial fibrillation, COPD, GERD who presents to the emergency department due to 2-3 day onset of generalized weakness. Patient with hyperkalemia and afib. Patient on chronic coumadin for afib. Home  dose is '5mg'$  daily per med rec. Last anticoag visit in Nov 2022, patient taking '5mg'$  daily except 2.'5mg'$  on Sunday.  Pharmacy asked to start IV heparin with new UE thrombus. Heparin drip 1100 uts/hr heparin level 0.27 slightly < goal with INR slowly trend down to 1.6 last dose of warfarin 5/18.  Planning comfort care when family arrives  no titration   Goal of Therapy:  HL 0.3-0.7 units/ml Monitor platelets by anticoagulation protocol: Yes   Plan:  Continue heparin infusion  1100 units/hr   Bonnita Nasuti Pharm.D. CPP, BCPS Clinical Pharmacist [phone] 02-24-2022 7:30 AM

## 2022-02-06 NOTE — Death Summary Note (Addendum)
  DEATH SUMMARY   Patient Details  Name: [name] MRN: [id number] DOB: 06-26-1934  Admission/Discharge Information   Admit Date:  26-Jan-2022  Date of Death: Date of Death: 02-02-2022  Time of Death: Time of Death: 0950  Length of Stay: 17-Nov-2022  Referring Physician: Redmond School, MD    Diagnoses  Progressive nonischemic dilated cardiomyopathy progressing to cardiogenic shock Mitral regurgitation probably functional Atrial fibrillation on warfarin COPD/asthma Cardiorenal syndrome OSA RUE DVT   Brief Hospital Course (including significant findings, care, treatment, and services provided and events leading to death)  88 year old female with PMH as below, which is significant for persistent atrial fibrillation on warfarin, COPD, mitral regurgitation, OSA, and non-ischemic cardiomyopathy. She presented to [facility] ED 5/18 with complaints of generalized weakness x 2-3 days. Also complained of occasional shortness of breath. She was found to be tachypneic and tachycardic. Also hypotensive to 91/75. Workup also significant for Renal failure, transaminitis, and elevated lactic acid. She was admitted to the hospitalist service with renal failure, lactic acidosis , and bilateral moderate pleural effusions. Workup included echocardiogram, which was significant for new reduction of biventricular function and severe bi-atrial dilation. Diagnosed with cardiogenic shock. Central line was placed and she was started on norepinephrine and milrinone infusions. Course complicated by acute thrombosis of R (unclear) radial vein and superficial thrombophlebitis of the rt basilic vein. Started on heparin infusion. She was transferred [facility] for ICU admission and heart failure specialist evaluation.   Patient was started on pressors, diuretics, anticoagulation and inotropic support.  Despite this she progressively became weaker and worsened O2 needs.  After discussions with family and patient  regarding lack of treatment options, everyone elected to pursue a natural death.  She passed peacefully with sister at bedside.  Pertinent Labs and Studies  Significant Diagnostic Studies No results found.  Microbiology No results found for this or any previous visit (from the past 240 hour(s)).   Lab Basic Metabolic Panel: No results for input(s): "NA", "K", "CL", "CO2", "GLUCOSE", "BUN", "CREATININE", "CALCIUM", "MG", "PHOS" in the last 168 hours.  Liver Function Tests: No results for input(s): "AST", "ALT", "ALKPHOS", "BILITOT", "PROT", "ALBUMIN" in the last 168 hours.  No results for input(s): "LIPASE", "AMYLASE" in the last 168 hours.  No results for input(s): "AMMONIA" in the last 168 hours. CBC: No results for input(s): "WBC", "NEUTROABS", "HGB", "HCT", "MCV", "PLT" in the last 168 hours.  Cardiac Enzymes: No results for input(s): "CKTOTAL", "CKMB", "CKMBINDEX", "TROPONINI" in the last 168 hours. Sepsis Labs: No results for input(s): "PROCALCITON", "WBC", "LATICACIDVEN" in the last 168 hours.     Candee Furbish 03/17/2022, 4:36 PM

## 2022-02-06 NOTE — Progress Notes (Addendum)
Patient ID: [name], female   DOB: Sep 23, 1933, 88 y.o.   MRN: [id number]      Advanced Heart Failure Rounding Note  PCP-Cardiologist: [name], MD   Subjective:    Appears uncomfortable this am. O2 up to 15L HFNC.   Continues on milrinone 0.125, NE up to 22 overnight. MAPs 50s-60s this am.  Making minimal urine.  Feels miserable. Reporting nausea and trouble breathing.    Objective:   Weight Range: 68.2 kg Body mass index is 24.27 kg/m.   Vital Signs:   Temp:  [97.9 F (36.6 C)-98.3 F (36.8 C)] 97.9 F (36.6 C) (05/24 0333) Pulse Rate:  [100-170] 109 (05/24 0700) Resp:  [10-24] 15 (05/24 0700) BP: (73-94)/(43-83) 80/49 (05/24 0400) SpO2:  [85 %-99 %] 92 % (05/24 0700) Arterial Line BP: (83-108)/(46-79) 93/46 (05/24 0700) Last BM Date : 01/26/22  Weight change: Filed Weights   01/26/22 0500 01/27/22 0500 01/28/22 0500  Weight: 66.3 kg 66.1 kg 68.2 kg    Intake/Output:   Intake/Output Summary (Last 24 hours) at 2022-02-23 0706 Last data filed at 02-23-22 0700 Gross per 24 hour  Intake 1641.09 ml  Output 200 ml  Net 1441.09 ml      Physical Exam   General:  Critically ill appearing elderly female HEENT: normal Neck: supple. JVP to jaw. Carotids 2+ bilat; no bruits.  Cor: PMI nondisplaced. Irregular rate & rhythm. No rubs, gallops, 2/6 MS murmur Lungs: crackles in bases to halfway up Abdomen: soft, nontender, + distended.  Extremities: no cyanosis, clubbing, rash, diffuse upper and LE edema Neuro:  Awake. Appears lethargic. Following commands.   Telemetry   Atrial fibrillation with rates 110s-120s (personally reviewed)  Labs    CBC No results for input(s): WBC, NEUTROABS, HGB, HCT, MCV, PLT in the last 72 hours.  Basic Metabolic Panel No results for input(s): NA, K, CL, CO2, GLUCOSE, BUN, CREATININE, CALCIUM, MG, PHOS in the last 72 hours.  Liver Function Tests No results for input(s): AST, ALT, ALKPHOS, BILITOT, PROT, ALBUMIN in  the last 72 hours.  No results for input(s): LIPASE, AMYLASE in the last 72 hours.  Cardiac Enzymes No results for input(s): CKTOTAL, CKMB, CKMBINDEX, TROPONINI in the last 72 hours.  BNP: BNP (last 3 results) Recent Labs    12/12/21 0837  BNP 573.0*    ProBNP (last 3 results) No results for input(s): PROBNP in the last 8760 hours.   D-Dimer No results for input(s): DDIMER in the last 72 hours. Hemoglobin A1C No results for input(s): HGBA1C in the last 72 hours. Fasting Lipid Panel No results for input(s): CHOL, HDL, LDLCALC, TRIG, CHOLHDL, LDLDIRECT in the last 72 hours. Thyroid Function Tests No results for input(s): TSH, T4TOTAL, T3FREE, THYROIDAB in the last 72 hours.  Invalid input(s): FREET3   Other results:   Imaging    No results found.   Medications:     Scheduled Medications:  arformoterol  15 mcg Nebulization BID   budesonide (PULMICORT) nebulizer solution  0.5 mg Nebulization BID   Chlorhexidine Gluconate Cloth  6 each Topical Daily   docusate sodium  100 mg Oral BID   midodrine  5 mg Oral TID WC   pantoprazole (PROTONIX) IV  40 mg Intravenous Q24H   polyethylene glycol  17 g Oral Daily   revefenacin  175 mcg Nebulization Daily    Infusions:  sodium chloride Stopped (01/24/22 0434)   sodium chloride     amiodarone 60 mg/hr (February 23, 2022 0615)   heparin 1,100  Units/hr (02/13/22 0700)   milrinone 0.125 mcg/kg/min (2022/02/13 0700)   norepinephrine (LEVOPHED) Adult infusion 22 mcg/min (February 13, 2022 0700)    PRN Medications: Place/Maintain arterial line **AND** sodium chloride, acetaminophen, albuterol, hydrocortisone, HYDROmorphone (DILAUDID) injection, LORazepam, ondansetron    Assessment/Plan   1. Cardiogenic shock: Echo this admission with EF < 20%, severely decreased RV function with normal RV size, severe biatrial enlargement, severe MS with mean gradient 10 and MVA 0.85 cm^2, moderate MR, IVC dilated.  Cause of cardiomyopathy uncertain,  suspect nonischemic.  Initially hypotensive with lactate > 9, lactate decreased to normal.  She is on NE 10 + milrinone 0.125 and c/w MSOF. Not candidate for advanced therapies with age and frailty. Palliative care team following. Planning to transition to comfort care once family arrives to visit this afternoon. Appears uncomfortable this am, increasing O2 demands and pressor requirements. Called and left a voicemail for niece to update her and allow family to come visit with sooner  - continue current gtts for now to allow for family visit  - Discussed with CCM 2. AKI on CKD stage 3: Creatinine 2.66 => 2.52 => 2.45, suspect due to shock/cardiorenal.  No longer checking labs. - I do not think she would be a candidate for RRT (would tolerate poorly).  - per above, transitioning to comfort care  3. Atrial fibrillation: Permanent.  Now with RVR 110s-120s - Continue amiodarone gtt 60 mg/hr.  - heparin gtt - Cardioversion unlikely to be successful given duration of AF.  4. RUE DVT: She is on heparin gtt.  5. COPD: H/o smoking.  6. Mitral stenosis: Severe.  This has been known for several years.  Severely calcified MV.  Not candidate for valve surgery.  7. Hyperkalemia: Improved.  8. Thrombocytopenia: Mild, suspect due to critical illness and not HIT.  9. Elevated LFTs: Mild, likely from shock liver.  10. Hyponatremia: Suspect hypervolemic hyponatremia.     GOC: She is DNR. Decision made to proceed to comfort care after family has time to visit with her.  Will continue current drips without escalation, transition to full comfort care later today.  In-hospital hospital death most likely.   Length of Stay: 7  [facility], [place], PA-C  February 13, 2022, 7:06 AM  Advanced Heart Failure Team Pager [phone] (M-F; 7a - 5p)  Please contact [facility] for night-coverage after hours (5p -7a ) and weekends on [email or web address]  Agree with the above note.   Uncomfortable, nauseated.  Clinical situation  worsening.    Family on their way, would liberalize comfort meds and move to full comfort care later today on family arrival.  In-hospital death anticipated.   Loralie Champagne 2022/02/13

## 2022-02-06 NOTE — Progress Notes (Signed)
   NAME:  REMMI ARMENTEROS, MRN:  [id number], DOB:  12-14-1933, LOS: 7 ADMISSION DATE:  01/24/2022, CONSULTATION DATE:  5/19 REFERRING MD:  Dr. Aundra Dubin, CHIEF COMPLAINT:  cardiogenic shock   History of Present Illness:  88 year old female with PMH as below, which is significant for persistent atrial fibrillation on warfarin, COPD, mitral regurgitation, OSA, and non-ischemic cardiomyopathy. She presented to [facility] ED 5/18 with complaints of generalized weakness x 2-3 days. Also complained of occasional shortness of breath. She was found to be tachypneic and tachycardic. Also hypotensive to 91/75. Workup also significant for Renal failure, transaminitis, and elevated lactic acid. She was admitted to the hospitalist service with renal failure, lactic acidosis , and bilateral moderate pleural effusions. Workup included echocardiogram, which was significant for new reduction of biventricular function and severe bi-atrial dilation. Diagnosed with cardiogenic shock. Central line was placed and she was started on norepinephrine and milrinone infusions. Course complicated by acute thrombosis of L vs R (unclear) radial vein and superficial thrombophlebitis of the rt basilic vein. Started on heparin infusion. She was transferred [facility] for ICU admission and heart failure specialist evaluation.   Pertinent  Medical History   has a past medical history of Asthma, Atrial fibrillation ([place]), Chronic rhinitis, COPD (chronic obstructive pulmonary disease) ([place]), GERD (gastroesophageal reflux disease), Mitral regurgitation, Mitral valve prolapse, Nasal polyps, Nonischemic cardiomyopathy ([place]), Obstructive sleep apnea, and Transudative pleural effusion.   Significant Hospital Events: Including procedures, antibiotic start and stop dates in addition to other pertinent events   5/18 admit to [facility], Cardiogenic shock 5/19 transfer to [facility] for ICU, Heart failure eval.  5/21 pt wanting to go home, positive  6L  5/22 remains on pressors/inotropes, positive balance, goals of care with plan for sister to visit 5/23, then potential cessation of aggressive support measures on 5/24  Interim History / Subjective:  Worsened respiratory status overnight. More weak, using accessory muscles to breath. Denies pain or breathlessness. Occasional nausea.  Objective   Blood pressure (!) 80/49, pulse (!) 109, temperature 97.9 F (36.6 C), temperature source Axillary, resp. rate 15, height '5\' 6"'$  (1.676 m), weight 68.2 kg, SpO2 92 %.        Intake/Output Summary (Last 24 hours) at 02-04-2022 0716 Last data filed at 2022/02/04 0700 Gross per 24 hour  Intake 1641.09 ml  Output 200 ml  Net 1441.09 ml    Filed Weights   01/26/22 0500 01/27/22 0500 01/28/22 0500  Weight: 66.3 kg 66.1 kg 68.2 kg    Examination: Frail elderly woman lying in bed Normal RR but using intercostal muscles Global anasarca Able to answer simple questions  No new labs  Resolved Hospital Problem list     Assessment & Plan:   End stage Cardiogenic shock End of life care DNR - Appears to be in active dying process with worsening pulmonary edema - Family is en route - Expect in hospital death, liberalize dilaudid dosing for s/s of SOB when family gets here; turn off inotropes and pressors once family here  Erskine Emery MD PCCM

## 2022-02-06 NOTE — Progress Notes (Incomplete)
Patient PH:[id number] J Stumpp      DOB: Nov 10, 1933      VXY:[id number]      Palliative Medicine Team    Subjective: Bedside visit. Patient's sister and brother in law present at beginning of visit. This RN collaborated with bedside RN to discontinue current interventions and initiate comfort care at the request of the patient.    Physical exam: Patient siting in bed, alert and able to make needs known at start of visit. Patient reporting significant shortness of breath and uncontrolled nausea. Breathing labored, shallow, with accessory muscle use noted. Patient displaying obvious signs of respiratory distress despite interventions, requesting to be made comfortable. Bedside RN provided education regarding the comfort medications to both patient and family, all in agreement and without questions at this time. This RN provided support and education to the family regarding end of life transition expectations. Janielle's favorite music was played in the background, sharing of life experiences was promoted with the family bedside.    Assessment and plan: Chaplain was called bedside by the RN per the request of patient's sister, Bethena Roys. Patient passed very quickly and without distress with aid of medications to manage symptoms and air hunger (see eMAR). Thank you to bedside RN Lynnze, transition was smooth and well supported. Palliative care remains available to support family and chaplain to assist in creating hand     Thank you for allowing the Palliative Medicine Team to assist in the care of this patient.     Damian Leavell, MSN, RN Palliative Medicine Team Team Phone: [phone]  This phone is monitored 7a-7p, please reach out to attending physician outside of these hours for urgent needs.

## 2022-02-06 NOTE — Progress Notes (Signed)
This chaplain responded to PMT RN-Hailey page for EOL spiritual care and prayer with the Pt. and family.   The Pt. husband, sister, and nephew are at the bedside along with the Pt. RN-Lynnze.  The chaplain listened reflectively as the family shared stories of the Pt. joy and love, and how they intersected the Pt. roles of mother, sister, aunt, wife and employee.  The chaplain prayed at the bedside with the Pt. and family before the Pt. peaceful passing. The chaplain is appreciative of the RN assistance with the family in making hand prints to take home.   Chaplain Sallyanne Kuster [phone]

## 2022-02-06 DEATH — deceased
